# Patient Record
Sex: Male | Born: 1980 | Race: White | Hispanic: No | Marital: Married | Smoking: Former smoker
Health system: Southern US, Community
[De-identification: ages and names within clinical notes are randomized; demographics above are authoritative.]

## PROBLEM LIST (undated history)

## (undated) DIAGNOSIS — I10 Essential (primary) hypertension: Secondary | ICD-10-CM

## (undated) DIAGNOSIS — N200 Calculus of kidney: Secondary | ICD-10-CM

## (undated) DIAGNOSIS — E118 Type 2 diabetes mellitus with unspecified complications: Secondary | ICD-10-CM

## (undated) DIAGNOSIS — I255 Ischemic cardiomyopathy: Secondary | ICD-10-CM

## (undated) DIAGNOSIS — Z9119 Patient's noncompliance with other medical treatment and regimen: Secondary | ICD-10-CM

## (undated) DIAGNOSIS — E1165 Type 2 diabetes mellitus with hyperglycemia: Secondary | ICD-10-CM

## (undated) DIAGNOSIS — I25810 Atherosclerosis of coronary artery bypass graft(s) without angina pectoris: Secondary | ICD-10-CM

## (undated) DIAGNOSIS — E669 Obesity, unspecified: Secondary | ICD-10-CM

## (undated) DIAGNOSIS — Z91199 Patient's noncompliance with other medical treatment and regimen due to unspecified reason: Secondary | ICD-10-CM

## (undated) DIAGNOSIS — E785 Hyperlipidemia, unspecified: Secondary | ICD-10-CM

## (undated) DIAGNOSIS — Z8673 Personal history of transient ischemic attack (TIA), and cerebral infarction without residual deficits: Secondary | ICD-10-CM

## (undated) DIAGNOSIS — I5020 Unspecified systolic (congestive) heart failure: Secondary | ICD-10-CM

## (undated) HISTORY — DX: Type 2 diabetes mellitus with unspecified complications: E11.8

## (undated) HISTORY — DX: Obesity, unspecified: E66.9

## (undated) HISTORY — DX: Type 2 diabetes mellitus with hyperglycemia: E11.65

## (undated) HISTORY — DX: Hyperlipidemia, unspecified: E78.5

## (undated) HISTORY — DX: Patient's noncompliance with other medical treatment and regimen: Z91.19

## (undated) HISTORY — DX: Patient's noncompliance with other medical treatment and regimen due to unspecified reason: Z91.199

## (undated) HISTORY — DX: Atherosclerosis of coronary artery bypass graft(s) without angina pectoris: I25.810

## (undated) HISTORY — DX: Personal history of transient ischemic attack (TIA), and cerebral infarction without residual deficits: Z86.73

## (undated) HISTORY — DX: Essential (primary) hypertension: I10

---

## 2000-10-11 DIAGNOSIS — IMO0002 Reserved for concepts with insufficient information to code with codable children: Secondary | ICD-10-CM

## 2000-10-11 HISTORY — DX: Reserved for concepts with insufficient information to code with codable children: IMO0002

## 2004-01-07 ENCOUNTER — Other Ambulatory Visit: Payer: Self-pay

## 2004-10-25 ENCOUNTER — Emergency Department: Payer: Self-pay | Admitting: Unknown Physician Specialty

## 2005-04-23 ENCOUNTER — Emergency Department: Payer: Self-pay | Admitting: Emergency Medicine

## 2005-04-24 ENCOUNTER — Emergency Department: Payer: Self-pay | Admitting: Emergency Medicine

## 2005-04-26 ENCOUNTER — Emergency Department: Payer: Self-pay | Admitting: Emergency Medicine

## 2005-06-22 ENCOUNTER — Emergency Department: Payer: Self-pay | Admitting: Emergency Medicine

## 2005-10-11 HISTORY — PX: CORONARY ARTERY BYPASS GRAFT: SHX141

## 2005-11-22 ENCOUNTER — Other Ambulatory Visit: Payer: Self-pay

## 2005-11-22 ENCOUNTER — Emergency Department: Payer: Self-pay | Admitting: Emergency Medicine

## 2006-01-04 ENCOUNTER — Emergency Department: Payer: Self-pay | Admitting: Emergency Medicine

## 2006-02-21 ENCOUNTER — Ambulatory Visit: Payer: Self-pay | Admitting: Physician Assistant

## 2006-03-17 ENCOUNTER — Ambulatory Visit: Payer: Self-pay | Admitting: Pain Medicine

## 2006-03-31 ENCOUNTER — Ambulatory Visit: Payer: Self-pay | Admitting: Pain Medicine

## 2006-04-21 ENCOUNTER — Ambulatory Visit: Payer: Self-pay | Admitting: Pain Medicine

## 2006-05-04 ENCOUNTER — Ambulatory Visit: Payer: Self-pay | Admitting: Physician Assistant

## 2006-05-12 ENCOUNTER — Encounter: Payer: Self-pay | Admitting: Physician Assistant

## 2006-05-30 ENCOUNTER — Ambulatory Visit: Payer: Self-pay | Admitting: Physician Assistant

## 2006-09-12 ENCOUNTER — Emergency Department: Payer: Self-pay | Admitting: Emergency Medicine

## 2006-09-12 ENCOUNTER — Other Ambulatory Visit: Payer: Self-pay

## 2007-02-20 ENCOUNTER — Emergency Department: Payer: Self-pay | Admitting: Emergency Medicine

## 2007-02-21 ENCOUNTER — Inpatient Hospital Stay (HOSPITAL_COMMUNITY): Admission: AD | Admit: 2007-02-21 | Discharge: 2007-02-26 | Payer: Self-pay | Admitting: Cardiology

## 2007-02-21 ENCOUNTER — Ambulatory Visit: Payer: Self-pay | Admitting: Cardiology

## 2007-02-21 ENCOUNTER — Encounter: Payer: Self-pay | Admitting: Vascular Surgery

## 2007-02-21 ENCOUNTER — Ambulatory Visit: Payer: Self-pay | Admitting: Surgery

## 2007-03-08 ENCOUNTER — Emergency Department (HOSPITAL_COMMUNITY): Admission: EM | Admit: 2007-03-08 | Discharge: 2007-03-08 | Payer: Self-pay | Admitting: Emergency Medicine

## 2007-03-21 ENCOUNTER — Encounter: Admission: RE | Admit: 2007-03-21 | Discharge: 2007-03-21 | Payer: Self-pay | Admitting: Surgery

## 2007-03-21 ENCOUNTER — Ambulatory Visit: Payer: Self-pay | Admitting: Surgery

## 2007-05-02 ENCOUNTER — Ambulatory Visit: Payer: Self-pay | Admitting: Cardiology

## 2007-05-02 LAB — CONVERTED CEMR LAB
ALT: 43 units/L (ref 0–53)
Bilirubin, Direct: 0.1 mg/dL (ref 0.0–0.3)
CO2: 27 meq/L (ref 19–32)
Calcium: 9.5 mg/dL (ref 8.4–10.5)
Cholesterol: 175 mg/dL (ref 0–200)
Creatinine, Ser: 0.7 mg/dL (ref 0.4–1.5)
GFR calc Af Amer: 174 mL/min
Glucose, Bld: 242 mg/dL — ABNORMAL HIGH (ref 70–99)
Potassium: 3.9 meq/L (ref 3.5–5.1)
Total Bilirubin: 0.6 mg/dL (ref 0.3–1.2)
Total CHOL/HDL Ratio: 5.1
Total Protein: 7.2 g/dL (ref 6.0–8.3)
Triglycerides: 156 mg/dL — ABNORMAL HIGH (ref 0–149)

## 2007-06-13 ENCOUNTER — Inpatient Hospital Stay: Payer: Self-pay | Admitting: Cardiology

## 2007-06-13 ENCOUNTER — Ambulatory Visit: Payer: Self-pay | Admitting: Cardiology

## 2007-06-14 ENCOUNTER — Ambulatory Visit: Payer: Self-pay | Admitting: Cardiology

## 2007-06-14 ENCOUNTER — Inpatient Hospital Stay (HOSPITAL_COMMUNITY): Admission: AD | Admit: 2007-06-14 | Discharge: 2007-06-16 | Payer: Self-pay | Admitting: Cardiology

## 2007-06-19 ENCOUNTER — Ambulatory Visit: Payer: Self-pay

## 2007-06-21 ENCOUNTER — Ambulatory Visit: Payer: Self-pay | Admitting: Cardiology

## 2007-06-21 ENCOUNTER — Ambulatory Visit: Payer: Self-pay | Admitting: Cardiovascular Disease

## 2007-06-21 ENCOUNTER — Inpatient Hospital Stay (HOSPITAL_COMMUNITY): Admission: EM | Admit: 2007-06-21 | Discharge: 2007-06-23 | Payer: Self-pay | Admitting: Emergency Medicine

## 2007-07-07 ENCOUNTER — Ambulatory Visit: Payer: Self-pay | Admitting: Cardiology

## 2007-07-27 ENCOUNTER — Emergency Department: Payer: Self-pay | Admitting: Emergency Medicine

## 2007-07-27 ENCOUNTER — Other Ambulatory Visit: Payer: Self-pay

## 2007-08-09 ENCOUNTER — Ambulatory Visit: Payer: Self-pay | Admitting: Cardiology

## 2007-09-26 ENCOUNTER — Other Ambulatory Visit: Payer: Self-pay

## 2007-09-26 ENCOUNTER — Emergency Department: Payer: Self-pay | Admitting: Emergency Medicine

## 2007-09-29 ENCOUNTER — Ambulatory Visit: Payer: Self-pay | Admitting: Cardiology

## 2007-10-12 DIAGNOSIS — Z8673 Personal history of transient ischemic attack (TIA), and cerebral infarction without residual deficits: Secondary | ICD-10-CM

## 2007-10-12 HISTORY — DX: Personal history of transient ischemic attack (TIA), and cerebral infarction without residual deficits: Z86.73

## 2007-12-05 ENCOUNTER — Inpatient Hospital Stay (HOSPITAL_COMMUNITY): Admission: EM | Admit: 2007-12-05 | Discharge: 2007-12-06 | Payer: Self-pay | Admitting: Emergency Medicine

## 2007-12-05 ENCOUNTER — Ambulatory Visit: Payer: Self-pay | Admitting: *Deleted

## 2007-12-20 ENCOUNTER — Ambulatory Visit: Payer: Self-pay | Admitting: Cardiology

## 2007-12-26 ENCOUNTER — Ambulatory Visit: Payer: Self-pay | Admitting: Cardiology

## 2007-12-26 LAB — CONVERTED CEMR LAB
ALT: 30 units/L (ref 0–53)
AST: 13 units/L (ref 0–37)
Alkaline Phosphatase: 54 units/L (ref 39–117)
BUN: 15 mg/dL (ref 6–23)
Calcium: 9.2 mg/dL (ref 8.4–10.5)
Chloride: 103 meq/L (ref 96–112)
Creatinine, Ser: 0.69 mg/dL (ref 0.40–1.50)
HDL: 36 mg/dL — ABNORMAL LOW (ref >39)
LDL Cholesterol: 113 mg/dL — ABNORMAL HIGH (ref 0–99)
Total CHOL/HDL Ratio: 4.9
VLDL: 28 mg/dL (ref 0–40)

## 2008-02-21 ENCOUNTER — Ambulatory Visit: Payer: Self-pay | Admitting: Cardiology

## 2008-02-21 ENCOUNTER — Emergency Department: Payer: Self-pay | Admitting: Internal Medicine

## 2008-02-21 ENCOUNTER — Inpatient Hospital Stay (HOSPITAL_COMMUNITY): Admission: AD | Admit: 2008-02-21 | Discharge: 2008-02-23 | Payer: Self-pay | Admitting: Cardiology

## 2008-02-22 ENCOUNTER — Encounter: Payer: Self-pay | Admitting: Cardiology

## 2008-07-27 ENCOUNTER — Emergency Department: Payer: Self-pay | Admitting: Emergency Medicine

## 2008-08-15 ENCOUNTER — Ambulatory Visit: Payer: Self-pay | Admitting: Internal Medicine

## 2008-08-15 ENCOUNTER — Inpatient Hospital Stay: Payer: Self-pay | Admitting: Internal Medicine

## 2008-09-04 ENCOUNTER — Ambulatory Visit: Payer: Self-pay | Admitting: Cardiology

## 2008-11-07 ENCOUNTER — Ambulatory Visit: Payer: Self-pay | Admitting: Cardiology

## 2008-11-19 ENCOUNTER — Encounter: Payer: Self-pay | Admitting: Cardiology

## 2008-11-19 ENCOUNTER — Ambulatory Visit: Payer: Self-pay | Admitting: Cardiology

## 2008-11-19 LAB — CONVERTED CEMR LAB
ALT: 25 units/L (ref 0–53)
AST: 14 units/L (ref 0–37)
Albumin: 4.3 g/dL (ref 3.5–5.2)
Alkaline Phosphatase: 54 units/L (ref 39–117)
Glucose, Bld: 346 mg/dL — ABNORMAL HIGH (ref 70–99)
Hgb A1c MFr Bld: 12.2 % — ABNORMAL HIGH (ref 4.6–6.1)
LDL Cholesterol: 137 mg/dL — ABNORMAL HIGH (ref 0–99)
Potassium: 4.3 meq/L (ref 3.5–5.3)
Sodium: 136 meq/L (ref 135–145)
Total Bilirubin: 0.6 mg/dL (ref 0.3–1.2)
Total Protein: 7.3 g/dL (ref 6.0–8.3)
Triglycerides: 174 mg/dL — ABNORMAL HIGH (ref <150)
VLDL: 35 mg/dL (ref 0–40)

## 2009-03-03 ENCOUNTER — Encounter: Payer: Self-pay | Admitting: Cardiology

## 2009-03-03 ENCOUNTER — Telehealth (INDEPENDENT_AMBULATORY_CARE_PROVIDER_SITE_OTHER): Payer: Self-pay | Admitting: *Deleted

## 2009-03-03 ENCOUNTER — Ambulatory Visit: Payer: Self-pay | Admitting: Cardiology

## 2009-03-03 DIAGNOSIS — I10 Essential (primary) hypertension: Secondary | ICD-10-CM

## 2009-03-03 DIAGNOSIS — I25810 Atherosclerosis of coronary artery bypass graft(s) without angina pectoris: Secondary | ICD-10-CM | POA: Insufficient documentation

## 2009-03-03 DIAGNOSIS — IMO0002 Reserved for concepts with insufficient information to code with codable children: Secondary | ICD-10-CM | POA: Insufficient documentation

## 2009-03-03 DIAGNOSIS — E108 Type 1 diabetes mellitus with unspecified complications: Secondary | ICD-10-CM

## 2009-03-03 DIAGNOSIS — E785 Hyperlipidemia, unspecified: Secondary | ICD-10-CM | POA: Insufficient documentation

## 2009-03-03 DIAGNOSIS — E1065 Type 1 diabetes mellitus with hyperglycemia: Secondary | ICD-10-CM

## 2009-03-04 ENCOUNTER — Telehealth (INDEPENDENT_AMBULATORY_CARE_PROVIDER_SITE_OTHER): Payer: Self-pay | Admitting: *Deleted

## 2009-03-05 ENCOUNTER — Ambulatory Visit: Payer: Self-pay | Admitting: Cardiology

## 2009-03-05 ENCOUNTER — Ambulatory Visit: Payer: Self-pay

## 2009-03-05 ENCOUNTER — Encounter (INDEPENDENT_AMBULATORY_CARE_PROVIDER_SITE_OTHER): Payer: Self-pay | Admitting: *Deleted

## 2009-03-05 DIAGNOSIS — R0789 Other chest pain: Secondary | ICD-10-CM

## 2009-03-17 LAB — CONVERTED CEMR LAB
ALT: 33 units/L (ref 0–53)
AST: 20 units/L (ref 0–37)
Cholesterol: 288 mg/dL — ABNORMAL HIGH (ref 0–200)
Direct LDL: 210 mg/dL
HDL: 35.4 mg/dL — ABNORMAL LOW (ref >39.00)
Hgb A1c MFr Bld: 12 % — ABNORMAL HIGH (ref 4.6–6.5)
Total Bilirubin: 1.2 mg/dL (ref 0.3–1.2)
Total Protein: 7 g/dL (ref 6.0–8.3)
Triglycerides: 233 mg/dL — ABNORMAL HIGH (ref 0.0–149.0)

## 2009-03-24 ENCOUNTER — Encounter: Payer: Self-pay | Admitting: Cardiovascular Disease

## 2009-03-24 ENCOUNTER — Inpatient Hospital Stay: Payer: Commercial Managed Care - HMO | Admitting: Internal Medicine

## 2009-03-24 ENCOUNTER — Ambulatory Visit: Payer: Self-pay | Admitting: Cardiovascular Disease

## 2009-03-24 ENCOUNTER — Telehealth: Payer: Self-pay | Admitting: Cardiology

## 2009-04-01 ENCOUNTER — Telehealth: Payer: Self-pay | Admitting: Cardiology

## 2009-04-01 ENCOUNTER — Ambulatory Visit: Payer: Self-pay | Admitting: Cardiology

## 2009-04-01 ENCOUNTER — Inpatient Hospital Stay (HOSPITAL_BASED_OUTPATIENT_CLINIC_OR_DEPARTMENT_OTHER): Admission: RE | Admit: 2009-04-01 | Discharge: 2009-04-01 | Payer: Self-pay | Admitting: Cardiology

## 2009-04-24 ENCOUNTER — Encounter: Payer: Self-pay | Admitting: Cardiology

## 2009-04-24 ENCOUNTER — Ambulatory Visit: Payer: Self-pay | Admitting: Cardiology

## 2009-05-14 ENCOUNTER — Telehealth: Payer: Self-pay | Admitting: Cardiology

## 2009-06-03 ENCOUNTER — Encounter: Payer: Self-pay | Admitting: Cardiology

## 2009-06-03 ENCOUNTER — Ambulatory Visit: Payer: Self-pay | Admitting: Cardiology

## 2009-06-09 LAB — CONVERTED CEMR LAB
Albumin: 4.3 g/dL (ref 3.5–5.2)
Alkaline Phosphatase: 48 units/L (ref 39–117)
Bilirubin, Direct: 0.1 mg/dL (ref 0.0–0.3)
HDL: 34 mg/dL — ABNORMAL LOW (ref >39)
LDL Cholesterol: 81 mg/dL (ref 0–99)
Total Bilirubin: 0.5 mg/dL (ref 0.3–1.2)
Total CHOL/HDL Ratio: 4.1

## 2009-06-24 ENCOUNTER — Telehealth: Payer: Self-pay | Admitting: Cardiology

## 2009-08-12 ENCOUNTER — Ambulatory Visit: Payer: Self-pay | Admitting: Cardiology

## 2009-09-15 ENCOUNTER — Ambulatory Visit: Payer: Self-pay | Admitting: Internal Medicine

## 2009-09-15 ENCOUNTER — Encounter: Payer: Self-pay | Admitting: Cardiology

## 2009-09-25 ENCOUNTER — Ambulatory Visit: Payer: Self-pay | Admitting: Internal Medicine

## 2009-09-25 ENCOUNTER — Inpatient Hospital Stay: Payer: Commercial Managed Care - HMO | Admitting: Internal Medicine

## 2009-10-01 ENCOUNTER — Telehealth: Payer: Self-pay | Admitting: Cardiology

## 2009-10-01 LAB — CONVERTED CEMR LAB
ALT: 36 units/L (ref 0–53)
CO2: 20 meq/L (ref 19–32)
Calcium: 10 mg/dL (ref 8.4–10.5)
Chloride: 101 meq/L (ref 96–112)
Cholesterol: 159 mg/dL (ref 0–200)
Glucose, Bld: 340 mg/dL — ABNORMAL HIGH (ref 70–99)
Sodium: 138 meq/L (ref 135–145)
Total Protein: 7.2 g/dL (ref 6.0–8.3)
Triglycerides: 186 mg/dL — ABNORMAL HIGH (ref <150)

## 2009-10-30 ENCOUNTER — Encounter: Payer: Self-pay | Admitting: Cardiology

## 2009-11-20 ENCOUNTER — Telehealth: Payer: Self-pay | Admitting: Cardiology

## 2009-11-21 ENCOUNTER — Emergency Department: Payer: Commercial Managed Care - HMO | Admitting: Emergency Medicine

## 2009-12-13 ENCOUNTER — Encounter: Payer: Self-pay | Admitting: Cardiology

## 2009-12-13 ENCOUNTER — Observation Stay: Payer: Commercial Managed Care - HMO | Admitting: Specialist

## 2009-12-13 ENCOUNTER — Ambulatory Visit: Payer: Self-pay | Admitting: Cardiology

## 2010-07-26 ENCOUNTER — Encounter: Payer: Self-pay | Admitting: Cardiovascular Disease

## 2010-07-26 ENCOUNTER — Inpatient Hospital Stay: Payer: Commercial Managed Care - HMO | Admitting: Specialist

## 2010-07-27 ENCOUNTER — Ambulatory Visit: Payer: Self-pay | Admitting: Cardiology

## 2010-07-27 ENCOUNTER — Encounter: Payer: Self-pay | Admitting: Cardiovascular Disease

## 2010-08-26 ENCOUNTER — Ambulatory Visit: Payer: Self-pay | Admitting: Cardiovascular Disease

## 2010-09-24 ENCOUNTER — Telehealth (INDEPENDENT_AMBULATORY_CARE_PROVIDER_SITE_OTHER): Payer: Self-pay | Admitting: *Deleted

## 2010-11-11 NOTE — Progress Notes (Signed)
Summary: Pain   Phone Note Call from Patient Call back at [phone]   Caller: Patient Call For: RN Summary of Call: Patient called and said for the last week he has had a really bad cough.  He is now experiencing pain in his chest.  Not sure if he needs to be seen or if this is normal has not experienced this pain before with a cough. Initial call taken by: [place],  November 20, 2009 9:43 AM  Follow-up for Phone Call        left message on wifes cell phone that hard coughing can result in muscle/chest pain.  since pt has been coughing for a week instructed pt to see PCP for management of issues.  Follow-up by: Charlena Cross, RN, BSN,  November 20, 2009 10:07 AM

## 2010-11-11 NOTE — Miscellaneous (Signed)
Summary: meds updated  Clinical Lists Changes  Medications: Added new medication of ZETIA 10 MG TABS (EZETIMIBE) Take one tablet by mouth daily.

## 2010-11-11 NOTE — Consult Note (Signed)
[facility]   Imported By: Harlon Flor 12/15/2009 16:03:46  _____________________________________________________________________  External Attachment:    Type:   Image     Comment:   External Document  Appended Document: [facility]  need to make sure he has followup  Appended Document: [facility]  has appt with Dr. Mariah Milling 10/31

## 2010-11-11 NOTE — Assessment & Plan Note (Signed)
Summary: EC6/AMD   Visit Type:  Initial Consult Primary Wilmot Quevedo:  Dr Sheppard Penton  CC:  Has not felt very well for the past couple of days; has sinus drainage and muscle aches.  He does experience chest tightness at times..  History of Present Illness: 30 yo with h/o premature CAD s/p CABG and PCI, long history of smoking though he states that he has stopped recently, medication noncompliance due to financial issues presents for followup.   Mr. Loughney reports that he has been out of his medications for some time. He is unable to afford Crestor or Plavix. He's also been out of his ACE inhibitor and beta blocker. He has occasional chest pain and was recently in the hospital October 17 for an episode of chest pain during which he ruled out for MI and had a Myoview that showed no ischemia, scar in the basal to mid inferolateral wall, small fixed midanterior septal perfusion defect.  he reports that he has not been buying his insulin as this is extensive. His sugars have been climbing that he has been trying to watch what he eats.  He does report that occasionally he has symptoms like something is trying to burst out of his chest on the left. He takes a nitroglycerin and it goes away quickly. He did have some shortness of breath while hunting he was walking up and down hills carrying lots of items.  ECG: NSR, rate of 85 beats per minute, ST abnormality in the inferior leads consistent with repolarization abnormality  Labs (8/10): LDL 81, HDL 34   Preventive Screening-Counseling & Management  Alcohol-Tobacco     Smoking Status: quit  Caffeine-Diet-Exercise     Does Patient Exercise: yes  Current Medications (verified): 1)  Aspir-Low 81 Mg Tbec (Aspirin) .... One Tablet Once Daily 2)  Plavix 75 Mg Tabs (Clopidogrel Bisulfate) .... Take One Tablet By Mouth Daily 3)  Relion N 100 Unit/ml Susp (Insulin Isophane Human) .... 40 Units Two Times A Day 4)  Lisinopril 5 Mg Tabs (Lisinopril) .... Take One  Tablet By Mouth Daily 5)  Metoprolol Tartrate 25 Mg Tabs (Metoprolol Tartrate) .... Take One Tablet By Mouth Twice A Day 6)  Cvs Ibuprofen Ib 200 Mg Tabs (Ibuprofen) .Marland Kitchen.. 1 By Mouth As Needed 7)  Crestor 40 Mg Tabs (Rosuvastatin Calcium) .... Take One Tablet By Mouth Daily.  Allergies (verified): No Known Drug Allergies  Past History:  Past Medical History: Last updated: 08/12/2009 1. Coronary artery disease status post coronary artery bypass grafting in May 2008.  There is an SVG to the PDA, SVG to the diagonal, LIMA to the LAD, and a free RIMA to an obtuse marginal.  The patient had PCI to the mid circumflex with a Cypher stent in August 2008.  The  patient's most recent heart cath was in 6/10 in the setting of multiple episodes of atypical chest pain and an equivocal myoview.  This showed LVEDP 14 mmHg, 90% RCA stenosis with patent SVG-PDA, patent proximal CFX stent, 50% distal CFX stenosis, 40% ostial OM1 stenosis, patent free RIMA to OM1 graft, 90% mid LAD stenosis, totally occluded diagonal, patent LIMA-LAD and patent SVG-diagonal.  EF 55-60% on LV-gram.  No lesion that could cause ischemic chest pain was noted.  The patient also had an echo done in November 2009, which showed an EF greater than 55%.  RV was normal in size and function.  There was mild diastolic dysfunction. 2. Hypertension. 3. Type 2 diabetes, which has been poorly controlled.  4. Hypercholesterolemia, which has been poorly controlled. 5. Poor medical compliance,  mainly due to economic issues. 6. Possible TIA with presentation to [facility] in November 2009.  The patient did have a normal MRI of the brain and MRA of the head and neck.  7. Medication noncompliance, mainly due to cost.   Family History: Last updated: 03/03/2009 Family history of premature CAD.   Social History: Last updated: 08/26/2010 Unemployed, married with one son.  Lives in [place].  On disability and has Medicaid.    Tobacco Use - No.  Tobacco Use - Former. Smoked 1 1/2 PPD. Quit 2007. Alcohol Use - no Regular Exercise - yes  Risk Factors: Exercise: yes (08/26/2010)  Risk Factors: Smoking Status: quit (08/26/2010)  Past Surgical History: CABG x 4 at [facility] 2007  Social History: Unemployed, married with one son.  Lives in [place].  On disability and has Medicaid.  Tobacco Use - No.  Tobacco Use - Former. Smoked 1 1/2 PPD. Quit 2007. Alcohol Use - no Regular Exercise - yes Smoking Status:  quit Does Patient Exercise:  yes  Review of Systems       The patient complains of chest pain.  The patient denies fever, weight loss, weight gain, vision loss, decreased hearing, hoarseness, syncope, dyspnea on exertion, peripheral edema, prolonged cough, abdominal pain, incontinence, muscle weakness, depression, and enlarged lymph nodes.    Vital Signs:  Patient profile:   30 year old male Height:      73 inches Weight:      249 pounds BMI:     32.97 Pulse rate:   85 / minute BP sitting:   118 / 80  (left arm) Cuff size:   large  Vitals Entered By: Bishop Dublin, CMA (August 26, 2010 3:30 PM)  Physical Exam  General:  Well developed, well nourished, in no acute distress. Head:  normocephalic and atraumatic Neck:  Neck supple, no JVD. No masses, thyromegaly or abnormal cervical nodes. Lungs:  Clear bilaterally to auscultation and percussion. Heart:  Non-displaced PMI, chest non-tender; regular rate and rhythm, S1, S2 without murmurs, rubs or gallops. Carotid upstroke normal, no bruit.  Pedals normal pulses. No edema, no varicosities. Abdomen:  Bowel sounds positive; abdomen soft and non-tender without masses Msk:  Back normal, normal gait. Muscle strength and tone normal. Pulses:  pulses normal in all 4 extremities Extremities:  No clubbing or cyanosis. Neurologic:  Alert and oriented x 3. Skin:  Intact without lesions or rashes. Psych:  Normal affect.   Impression &  Recommendations:  Problem # 1:  CHEST PAIN UNSPECIFIED (ICD-786.50) he does have episodes of chest pain consistent with angina. These have been rare, and relieved with occasional nitroglycerin. He had a recent stress test showing no ischemia. I suspect he does have underlying small vessel disease given his poorly controlled diabetes and long history of smoking.  He is not interested in starting long-acting nitroglycerin at this time. He will take his metoprolol and lisinopril. He does not want to fill his Crestor and Plavix as they are too expensive.  His updated medication list for this problem includes:    Aspir-low 81 Mg Tbec (Aspirin) ..... One tablet once daily    Plavix 75 Mg Tabs (Clopidogrel bisulfate) .Marland Kitchen... Take one tablet by mouth daily    Lisinopril 5 Mg Tabs (Lisinopril) .Marland Kitchen... Take one tablet by mouth daily    Metoprolol Tartrate 25 Mg Tabs (Metoprolol tartrate) .Marland Kitchen... Take one tablet by mouth twice a  day  Problem # 2:  HYPERLIPIDEMIA-MIXED (ICD-272.4) As he's not feeling his Crestor, we have given him a prescription for Lipitor 80 mg daily which he can feel when he goes generic next month.  The following medications were removed from the medication list:    Zetia 10 Mg Tabs (Ezetimibe) .Marland Kitchen... Take one tablet by mouth daily. His updated medication list for this problem includes:    Lipitor 80 Mg Tabs (Atorvastatin calcium) .Marland Kitchen... Take one tablet by mouth daily.  Problem # 3:  CAD, ARTERY BYPASS GRAFT (ICD-414.04) As detailed from a recent stress test which showed no ischemia. Will put him back on his aspirin 81 mg x2 with lisinopril and metoprolol.  He cannot afford Plavix at this time.  His updated medication list for this problem includes:    Aspir-low 81 Mg Tbec (Aspirin) ..... One tablet once daily    Plavix 75 Mg Tabs (Clopidogrel bisulfate) .Marland Kitchen... Take one tablet by mouth daily    Lisinopril 5 Mg Tabs (Lisinopril) .Marland Kitchen... Take one tablet by mouth daily    Metoprolol Tartrate  25 Mg Tabs (Metoprolol tartrate) .Marland Kitchen... Take one tablet by mouth twice a day  Orders: EKG w/ Interpretation (93000)  Problem # 4:  HYPERTENSION, UNSPECIFIED (ICD-401.9) We'll start him back on his ACE inhibitor and beta blocker.  His updated medication list for this problem includes:    Aspir-low 81 Mg Tbec (Aspirin) ..... One tablet once daily    Lisinopril 5 Mg Tabs (Lisinopril) .Marland Kitchen... Take one tablet by mouth daily    Metoprolol Tartrate 25 Mg Tabs (Metoprolol tartrate) .Marland Kitchen... Take one tablet by mouth twice a day  Patient Instructions: 1)  Your physician recommends that you schedule a follow-up appointment in: 6 months 2)  Your physician has recommended you make the following change in your medication: Start Lipitor 80mg  once daily once goes generic in December.  Prescriptions: LISINOPRIL 5 MG TABS (LISINOPRIL) Take one tablet by mouth daily  #30 x 6   Entered by:   Cloyde Reams RN   Authorized by:   Dossie Arbour MD   Signed by:   Cloyde Reams RN on 08/26/2010   Method used:   Electronically to        [facility] [address] (retail)       [address], [address]       [place], [place]  [address]       Ph: [phone]       Fax: [phone]   RxID:   [id number] METOPROLOL TARTRATE 25 MG TABS (METOPROLOL TARTRATE) Take one tablet by mouth twice a day  #60 x 6   Entered by:   Cloyde Reams RN   Authorized by:   Dossie Arbour MD   Signed by:   Cloyde Reams RN on 08/26/2010   Method used:   Electronically to        [facility] [address] (retail)       [address], [address]       [place], [place]  [address]       Ph: [phone]       Fax: [phone]   RxID:   [id number] LIPITOR 80 MG TABS (ATORVASTATIN CALCIUM) Take one tablet by mouth daily.  #30 x 6   Entered by:   Cloyde Reams RN   Authorized by:   Dossie Arbour MD   Signed by:  Cloyde Reams RN on 08/26/2010   Method used:   Electronically to         [facility] [address] (retail)       [address], [address]       [place], [place]  [address]       Ph: [phone]       Fax: [phone]   RxID:   [id number] CRESTOR 20 MG TABS (ROSUVASTATIN CALCIUM) Take one tablet by mouth daily.  #30 x 0   Entered by:   Cloyde Reams RN   Authorized by:   Dossie Arbour MD   Signed by:   Cloyde Reams RN on 08/26/2010   Method used:   Print then Give to Patient   RxID:   [id number]

## 2010-11-11 NOTE — Consult Note (Signed)
[facility]   [facility]   Imported By: Roderic Ovens 08/03/2010 15:41:45  _____________________________________________________________________  External Attachment:    Type:   Image     Comment:   External Document

## 2010-11-12 NOTE — Progress Notes (Signed)
  REquest received from [employer] sent to [employer]  September 24, 2010 2:13 PM

## 2010-12-17 ENCOUNTER — Institutional Professional Consult (permissible substitution): Payer: Self-pay | Admitting: Cardiology

## 2010-12-18 ENCOUNTER — Encounter: Payer: Self-pay | Admitting: Cardiology

## 2010-12-18 ENCOUNTER — Institutional Professional Consult (permissible substitution) (INDEPENDENT_AMBULATORY_CARE_PROVIDER_SITE_OTHER): Payer: Medicare Other | Admitting: Cardiology

## 2010-12-18 DIAGNOSIS — I2510 Atherosclerotic heart disease of native coronary artery without angina pectoris: Secondary | ICD-10-CM

## 2010-12-18 DIAGNOSIS — E119 Type 2 diabetes mellitus without complications: Secondary | ICD-10-CM

## 2010-12-21 ENCOUNTER — Encounter: Payer: Self-pay | Admitting: Cardiology

## 2010-12-22 ENCOUNTER — Encounter: Payer: Self-pay | Admitting: Family Medicine

## 2010-12-22 ENCOUNTER — Encounter (INDEPENDENT_AMBULATORY_CARE_PROVIDER_SITE_OTHER): Payer: Medicare Other | Admitting: Family Medicine

## 2010-12-22 DIAGNOSIS — I10 Essential (primary) hypertension: Secondary | ICD-10-CM

## 2010-12-22 DIAGNOSIS — S139XXA Sprain of joints and ligaments of unspecified parts of neck, initial encounter: Secondary | ICD-10-CM

## 2010-12-22 DIAGNOSIS — I25810 Atherosclerosis of coronary artery bypass graft(s) without angina pectoris: Secondary | ICD-10-CM

## 2010-12-22 DIAGNOSIS — E119 Type 2 diabetes mellitus without complications: Secondary | ICD-10-CM

## 2010-12-22 DIAGNOSIS — E669 Obesity, unspecified: Secondary | ICD-10-CM | POA: Insufficient documentation

## 2010-12-22 DIAGNOSIS — E785 Hyperlipidemia, unspecified: Secondary | ICD-10-CM

## 2010-12-22 LAB — HM DIABETES FOOT EXAM

## 2010-12-22 NOTE — Assessment & Plan Note (Signed)
Summary: ROV chest pain/sgc,cma   Visit Type:  Initial Consult Primary Provider:  None. Looking for a PCP  CC:  c/o chest pain and pain near collar bone. denies SOB.[name]  History of Present Illness: 30 yo with h/o premature CAD s/p CABG and PCI presents for followup.  He had some chest pain in 10/11 and went to [facility] where he had a stress test showing basal to mid inferolateral scar and a small area of scar in the mid anteroseptum.  There was no ischemia.  He saw Dr. Mariah Milling in 11/11, at which time he was out of his medications and was restarted on them.  Lately he has been doing fairly well.  He has had no chest pain recently.  He has been getting some soreness in his left upper back that is not exertional.  He has some shortness of breath walking up hills (this has been chronic).  He can climb a flight of steps without trouble.  He is still having trouble getting his medications and still has not followed up with a PCP.  He is taking insulin 70/30 50 units two times a day and says that his blood glucose is running in the 200s.  He initially said he was taking all his meds, then he said he was "stretching out what he had left."  I am unsure about his compliance.  He is definitely out of Plavix.   ECG: NSR, normal  Current Medications (verified): 1)  Aspir-Low 81 Mg Tbec (Aspirin) .... 2 Tablest Once Daily 2)  Plavix 75 Mg Tabs (Clopidogrel Bisulfate) .... Take One Tablet By Mouth Daily 3)  Relion N 100 Unit/ml Susp (Insulin Isophane Human) .... 40 Units Two Times A Day 4)  Lisinopril 5 Mg Tabs (Lisinopril) .... Take One Tablet By Mouth Daily 5)  Metoprolol Tartrate 25 Mg Tabs (Metoprolol Tartrate) .... Take One Tablet By Mouth Twice A Day 6)  Cvs Ibuprofen Ib 200 Mg Tabs (Ibuprofen) .[name].. 1 By Mouth As Needed 7)  Lipitor 80 Mg Tabs (Atorvastatin Calcium) .... Take One Tablet By Mouth Daily.  Allergies (verified): No Known Drug Allergies  Past History:  Past Surgical History: Last updated:  08/26/2010 CABG x 4 at [facility] 2007  Family History: Last updated: 03/03/2009 Family history of premature CAD.   Social History: Last updated: 12/18/2010 Unemployed, married with one son.  Lives in [place].  On disability.  Tobacco Use - Former. Smoked 1 1/2 PPD. Quit 2007. Alcohol Use - no Regular Exercise - yes  Risk Factors: Exercise: yes (08/26/2010)  Risk Factors: Smoking Status: quit (08/26/2010)  Past Medical History: 1. Coronary artery disease status post coronary artery bypass grafting in May 2008.  There is an SVG to the PDA, SVG to the diagonal, LIMA to the LAD, and a free RIMA to an obtuse marginal.  The patient had PCI to the mid circumflex with a Cypher stent in August 2008.  The  patient's most recent heart cath was in 6/10 in the setting of multiple episodes of atypical chest pain and an equivocal myoview.  This showed LVEDP 14 mmHg, 90% RCA stenosis with patent SVG-PDA, patent proximal CFX stent, 50% distal CFX stenosis, 40% ostial OM1 stenosis, patent free RIMA to OM1 graft, 90% mid LAD stenosis, totally occluded diagonal, patent LIMA-LAD and patent SVG-diagonal.  EF 55-60% on LV-gram.  No lesion that could cause ischemic chest pain was noted.  Last echo 11/09 showed EF > 55%, normal RV, mild diastolic dysfunction.  ETT-myoview (10/11)  at [facility]: basal to mid inferolateral scar, mild mid anteroseptal scar, no ischemia.  2. Hypertension. 3. Type 2 diabetes, which has been poorly controlled. 4. Hypercholesterolemia, which has been poorly controlled. 5. Poor medical compliance,  mainly due to economic issues. 6. Possible TIA with presentation to [facility] in November 2009.  The patient did have a normal MRI of the brain and MRA of the head and neck.   Family History: Reviewed history from 03/03/2009 and no changes required. Family history of premature CAD.   Social History: Unemployed, married with one son.  Lives in [place].  On  disability.  Tobacco Use - Former. Smoked 1 1/2 PPD. Quit 2007. Alcohol Use - no Regular Exercise - yes  Review of Systems       All systems reviewed and negative except as per HPI.   Vital Signs:  Patient profile:   30 year old male Height:      73 inches Weight:      248.25 pounds BMI:     32.87 Pulse rate:   91 / minute BP sitting:   132 / 87  (left arm) Cuff size:   large  Vitals Entered By: Lysbeth Galas CMA (December 18, 2010 9:53 AM)  Physical Exam  General:  Well developed, well nourished, in no acute distress. Neck:  Neck supple, no JVD. No masses, thyromegaly or abnormal cervical nodes. Lungs:  Clear bilaterally to auscultation and percussion. Heart:  Non-displaced PMI, chest non-tender; regular rate and rhythm, S1, S2 without murmurs, rubs or gallops. Carotid upstroke normal, no bruit.  Pedals normal pulses. No edema, no varicosities. Abdomen:  Bowel sounds positive; abdomen soft and non-tender without masses, organomegaly, or hernias noted. No hepatosplenomegaly. Msk:  Tenderness to palpation over the trapezius ridge on the left.  Extremities:  No clubbing or cyanosis. Neurologic:  Alert and oriented x 3. Psych:  Normal affect.   Impression & Recommendations:  Problem # 1:  CAD, ARTERY BYPASS GRAFT (ICD-414.04) Stable with no ischemic symptoms.  I think that the back pain is musculoskeletal (tender over trapezius ridge).  He needs to continue ASA, Plavix, lisinopril, and metoprolol.  He seems to be taking all of these except Plavix.  We will give him Plavix samples.  We will give him the paperwork for the patient assistance programs run by the makers of Plavix and Lipitor.  He should be able to get both free as he has no prescription coverage.   Problem # 2:  DIABETES MELLITUS, TYPE II (ICD-250.00) Control is most likely poor.  Will get HgbA1c.  He needs a PCP.  I am going to refer him to [facility].   Problem # 3:  HYPERLIPIDEMIA-MIXED (ICD-272.4) Goal LDL < 70.   Check lipids/LFTs.   Other Orders: EKG w/ Interpretation (93000)  Patient Instructions: 1)  Your physician recommends that you schedule a follow-up appointment in: 4 months 2)  Your physician recommends that you return for a FASTING lipid profile: (BMP, Lipid, Lft, Hgb A1c) Next week 3)  You have been referred to Primary Care:

## 2010-12-23 ENCOUNTER — Other Ambulatory Visit: Payer: Medicare Other

## 2010-12-25 ENCOUNTER — Other Ambulatory Visit: Payer: Medicare Other

## 2010-12-28 ENCOUNTER — Encounter: Payer: Self-pay | Admitting: Cardiology

## 2010-12-28 ENCOUNTER — Emergency Department: Payer: Commercial Managed Care - HMO | Admitting: Emergency Medicine

## 2010-12-28 ENCOUNTER — Other Ambulatory Visit (INDEPENDENT_AMBULATORY_CARE_PROVIDER_SITE_OTHER): Payer: Medicare Other

## 2010-12-28 DIAGNOSIS — Z79899 Other long term (current) drug therapy: Secondary | ICD-10-CM

## 2010-12-28 DIAGNOSIS — E785 Hyperlipidemia, unspecified: Secondary | ICD-10-CM

## 2010-12-28 DIAGNOSIS — E119 Type 2 diabetes mellitus without complications: Secondary | ICD-10-CM

## 2010-12-29 NOTE — Assessment & Plan Note (Signed)
Summary: NEW [facility]ARE-REF BY DR, MCLEAN-CARDIOLOGIST- TO EST W/ PCP   Vital Signs:  Patient profile:   30 year old male Height:      73 inches Weight:      251 pounds Temp:     97.8 degrees F oral Pulse rate:   88 / minute Pulse rhythm:   regular BP sitting:   116 / 84  (left arm) Cuff size:   large  Vitals Entered By: Selena Batten Dance CMA Duncan Dull) (December 22, 2010 10:33 AM) CC: New patient to establish care   History of Present Illness: CC: new patient, establish  previously saw Dr. Sheppard Penton in [place].  not taking meds as should be, some because of finance, some because of self proclaimed stubbornness.  neck pain/shoulder pain - going on for 2 wks, feels like has weights on shoulders.  pain collarbone and neck area.  has tried neck rubs, tylenol, advil, alleve, heating pad.  Nothing really helps.  Pain starts upper chest/shoulders, radiates to back of neck.  no weakness or shooting pain down arms.  no current numbness, has had R arm numb in past.  DM - out of control.  to get A1c checked Friday by cards.  A1c last checked last year, high.  On insulin shots (70/30, 50 u two times a day).  would bring blood sugar down.  Ranged 250-325.  DM dx 30yo.  unsure if T1 or T2DM.  drank alot of mountain dew.  Last vision screen middle of last year, last foot exam unsure.  biggest issue is eating large portions and carbs.  usually would check three times a day now.    HLD - on crestor samples, then planning on getting back to lipitor.  Weight 305 lbs at DM dx 2001, has lost  ~50lbs in last 10 years.    financial concerns.  Current Medications (verified): 1)  Aspir-Low 81 Mg Tbec (Aspirin) .Marland Kitchen.. 1 Tablet Once Daily 2)  Plavix 75 Mg Tabs (Clopidogrel Bisulfate) .... Take One Tablet By Mouth Daily 3)  Novolog Mix 70/30 70-30 % Susp (Insulin Aspart Prot & Aspart) .... 20 Units Two Times A Day, Qs 1 Mo 4)  Lisinopril 5 Mg Tabs (Lisinopril) .... Take One Tablet By Mouth Daily 5)  Metoprolol Tartrate 25 Mg Tabs  (Metoprolol Tartrate) .... Take One Tablet By Mouth Twice A Day 6)  Cvs Ibuprofen Ib 200 Mg Tabs (Ibuprofen) .Marland Kitchen.. 1 By Mouth As Needed 7)  Lipitor 80 Mg Tabs (Atorvastatin Calcium) .... Take One Tablet By Mouth Daily. 8)  Crestor 10 Mg Tabs (Rosuvastatin Calcium) .... 2 By Mouth At Bedtime  Allergies (verified): No Known Drug Allergies  Past History:  Past medical, surgical, family and social histories (including risk factors) reviewed, and no changes noted (except as noted below).  Past Medical History: 1. Coronary artery disease status post coronary artery bypass grafting in May 2008.  There is an SVG to the PDA, SVG to the diagonal, LIMA to the LAD, and a free RIMA to an obtuse marginal.  The patient had PCI to the mid circumflex with a Cypher stent in August 2008.  The  patient's most recent heart cath was in 6/10 in the setting of multiple episodes of atypical chest pain and an equivocal myoview.  This showed LVEDP 14 mmHg, 90% RCA stenosis with patent SVG-PDA, patent proximal CFX stent, 50% distal CFX stenosis, 40% ostial OM1 stenosis, patent free RIMA to OM1 graft, 90% mid LAD stenosis, totally occluded diagonal, patent LIMA-LAD and patent  SVG-diagonal.  EF 55-60% on LV-gram.  No lesion that could cause ischemic chest pain was noted.  Last echo 11/09 showed EF > 55%, normal RV, mild diastolic dysfunction.  ETT-myoview (10/11) at [facility]: basal to mid inferolateral scar, mild mid anteroseptal scar, no ischemia.  2. Hypertension. 3. Type 2 diabetes, which has been poorly controlled (dx 2002). 4. Hypercholesterolemia, which has been poorly controlled. 5. Poor medical compliance,  mainly due to economic issues. 6. Possible TIA with presentation to [facility] in November 2009.  The patient did have a normal MRI of the brain and MRA of the head and neck.   Past Surgical History: CABG x 4 at [facility] 2007 (age 26yo)  Family History: Reviewed history from 03/03/2009 and no  changes required. Father: smoker, emphysema Mother: healthy MGF: CAD/MI 27s, DM, HTN, HLD Pucles/aunts: lung CA (all smokers) Family history of premature CAD.  No CA.  Social History: Reviewed history from 12/18/2010 and no changes required. quit smoking 2006 (1 1/2 ppd prior), no EtOH, no rec drugs caffeine: 1 cup coffee, 3/4 diet soda 2L per day  occupation: disability for heart condition Lives with wife and son (8yo).  Lives in [place].  On disability.  Regular Exercise - yes, coaches baseball, gets outside and walks daily, hunting.  Review of Systems       The patient complains of chest pain.  The patient denies anorexia, fever, weight loss, weight gain, vision loss, decreased hearing, hoarseness, syncope, dyspnea on exertion, peripheral edema, prolonged cough, headaches, hemoptysis, abdominal pain, melena, hematochezia, severe indigestion/heartburn, hematuria, depression, and testicular masses.         no n/v/d all other systems reviewed and negative  Physical Exam  General:  Well developed, well nourished, in no acute distress.  VSS, reviewed. Head:  normocephalic and atraumatic Eyes:  No corneal or conjunctival inflammation noted. EOMI. Perrla.  Ears:  TMs clear bilaterally Nose:  nares clear bilaterally Mouth:  MMM, no pharygeal erythema/exudates Neck:  Neck supple, no JVD. No masses, thyromegaly or abnormal cervical nodes. Chest Wall:  tender to palpation R 2nd costochondral juncture, mild swelling. Lungs:  Normal respiratory effort, chest expands symmetrically. Lungs are clear to auscultation, no crackles or wheezes. Heart:  Normal rate and regular rhythm. S1 and S2 normal without gallop, murmur, click, rub or other extra sounds. Abdomen:  Bowel sounds positive; abdomen soft and non-tender without masses, organomegaly, or hernias noted. No hepatosplenomegaly. Msk:  FROM at neck, neg spurling tight trapezius throughout no midline spinal tenderness, no paraspinous mm  tenderness Pulses:  2+ rad pulses  2+ DP/PT bilat Extremities:  mild edema Neurologic:  CN grossly intact, station and gait intact. strength (including grip strength) and sensation intact BUE Skin:  Intact without lesions or rashes. Psych:  full affect, pleasant and cooperative with exam  Diabetes Management Exam:    Foot Exam (with socks and/or shoes not present):       Sensory-Pinprick/Light touch:          Left medial foot (L-4): normal          Left dorsal foot (L-5): normal          Left lateral foot (S-1): normal          Right medial foot (L-4): normal          Right dorsal foot (L-5): normal          Right lateral foot (S-1): normal       Sensory-Monofilament:  Left foot: normal          Right foot: diminished       Inspection:          Left foot: normal          Right foot: normal       Nails:          Left foot: normal          Right foot: normal   Impression & Recommendations:  Problem # 1:  DIABETES MELLITUS, TYPE II (ICD-250.00) A1c out of control.  currently not on meds, ran out of insulin was too expensive.  restart novolong 70/30.  rec start at 20u two times a day (previously on 50u two times a day and not controlled).  also will start metformin again.  Cr would tolerate.  pt states has taken well in past.  discussed side effect to watch out for - n/gi upset.  discussed slow titration up on 70/30 ( 1-2 units two times a day every 3 days if avg fasting sugar >200.)  to call me in 2 wks with update on sugars.  to return in 1 mo.  states has lab appt scheduled for friday. discussed reasons to treat DM including nephropathy, retinopathy amputations, infection and other end organ damage (CAD/CVA, etc) foot exam today.  per patient UTD vision screen (mid last year) keep log and bring next visit.  His updated medication list for this problem includes:    Aspir-low 81 Mg Tbec (Aspirin) .Marland Kitchen... 1 tablet once daily    Novolog Mix 70/30 70-30 % Susp (Insulin aspart  prot & aspart) .Marland Kitchen... 20 units two times a day, qs 1 mo    Metformin Hcl 500 Mg Tabs (Metformin hcl) .Marland Kitchen... Take one nightly, may increase to one twice daily after 1 week    Lisinopril 5 Mg Tabs (Lisinopril) .Marland Kitchen... Take one tablet by mouth daily  Labs Reviewed: Creat: 0.76 (09/15/2009)    Reviewed HgBA1c results: 12.0 (03/05/2009)  12.2 (11/19/2008)  Problem # 2:  HYPERTENSION, UNSPECIFIED (ICD-401.9) not hypertensive, off meds.  recommended restart meds, refilled.  would likely just start with ACEI initially, then add on B blocker if tolerated.  states has lab appt scheduled for friday.  His updated medication list for this problem includes:    Lisinopril 5 Mg Tabs (Lisinopril) .Marland Kitchen... Take one tablet by mouth daily    Metoprolol Tartrate 25 Mg Tabs (Metoprolol tartrate) .Marland Kitchen... Take one tablet by mouth twice a day  BP today: 116/84 Prior BP: 132/87 (12/18/2010)  Labs Reviewed: K+: 4.7 (09/15/2009) Creat: : 0.76 (09/15/2009)   Chol: 159 (09/15/2009)   HDL: 30 (09/15/2009)   LDL: 92 (09/15/2009)   TG: 186 (09/15/2009)  Problem # 3:  HYPERLIPIDEMIA-MIXED (ICD-272.4) LDL was 92 last checked 2010.  due for recheck, currently on crestor samples, once runs out planning on switching to lipitor per cards.  unsure dose (doubt he'll need 80mg ?).  His updated medication list for this problem includes:    Lipitor 80 Mg Tabs (Atorvastatin calcium) .Marland Kitchen... Take one tablet by mouth daily.    Crestor 10 Mg Tabs (Rosuvastatin calcium) .Marland Kitchen... 2 by mouth at bedtime until samples run out  Labs Reviewed: SGOT: 15 (09/15/2009)   SGPT: 36 (09/15/2009)   HDL:30 (09/15/2009), 34 (06/03/2009)  LDL:92 (09/15/2009), 81 (06/03/2009)  Chol:159 (09/15/2009), 140 (06/03/2009)  Trig:186 (09/15/2009), 124 (06/03/2009)  Problem # 4:  CAD, ARTERY BYPASS GRAFT (ICD-414.04) so young and already with CAD and DM complications.  currently on plavix and baby aspirin.  wasn't taking ACEI/Bblocker.  restart.  start with ACEI, await  blood work, if next visit bp would tolerate B blocker, start that as well.  His updated medication list for this problem includes:    Aspir-low 81 Mg Tbec (Aspirin) .Marland Kitchen... 1 tablet once daily    Plavix 75 Mg Tabs (Clopidogrel bisulfate) .Marland Kitchen... Take one tablet by mouth daily    Lisinopril 5 Mg Tabs (Lisinopril) .Marland Kitchen... Take one tablet by mouth daily    Metoprolol Tartrate 25 Mg Tabs (Metoprolol tartrate) .Marland Kitchen... Take one tablet by mouth twice a day  Labs Reviewed: Chol: 159 (09/15/2009)   HDL: 30 (09/15/2009)   LDL: 92 (09/15/2009)   TG: 186 (09/15/2009)  Problem # 5:  CERVICAL STRAIN (ICD-847.0) sounds very MSK in nature.  no red flags.  start with flexeril, continue ibuprofen.  stretching exercises for upper back provided from [facility] patient advisor.  update [place] if not improving as expected.  His updated medication list for this problem includes:    Aspir-low 81 Mg Tbec (Aspirin) .Marland Kitchen... 1 tablet once daily    Cvs Ibuprofen Ib 200 Mg Tabs (Ibuprofen) .Marland Kitchen... 1 by mouth as needed    Flexeril 10 Mg Tabs (Cyclobenzaprine hcl) .Marland Kitchen... Take one twice daily as needed muscle spasm, sedation precautions  Problem # 6:  OBESITY (ICD-278.00) discussed healthy eating, staying active.  pt will work on portion sizes and discussed plate model with 1/2 of plate vegetables, then 1/4 starch and 1/4 protein.  Ht: 73 (12/22/2010)   Wt: 251 (12/22/2010)   BMI: 32.87 (12/18/2010)  Complete Medication List: 1)  Aspir-low 81 Mg Tbec (Aspirin) .Marland Kitchen.. 1 tablet once daily 2)  Plavix 75 Mg Tabs (Clopidogrel bisulfate) .... Take one tablet by mouth daily 3)  Novolog Mix 70/30 70-30 % Susp (Insulin aspart prot & aspart) .... 20 units two times a day, qs 1 mo 4)  Metformin Hcl 500 Mg Tabs (Metformin hcl) .... Take one nightly, may increase to one twice daily after 1 week 5)  Lisinopril 5 Mg Tabs (Lisinopril) .... Take one tablet by mouth daily 6)  Metoprolol Tartrate 25 Mg Tabs (Metoprolol tartrate) .... Take one tablet by mouth twice  a day 7)  Cvs Ibuprofen Ib 200 Mg Tabs (Ibuprofen) .Marland Kitchen.. 1 by mouth as needed 8)  Lipitor 80 Mg Tabs (Atorvastatin calcium) .... Take one tablet by mouth daily. 9)  Crestor 10 Mg Tabs (Rosuvastatin calcium) .... 2 by mouth at bedtime until samples run out 10)  Flexeril 10 Mg Tabs (Cyclobenzaprine hcl) .... Take one twice daily as needed muscle spasm, sedation precautions  Patient Instructions: 1)  return in 1 month for follow up. 2)  For cholesterol - continue crestor, we will try and set you up with Lipitor after this. 3)  For heart - continue plavix and baby aspirin daily.  continue lisinopril once daily. 4)  For diabetes - foot exam today.  start novolog 70/30 20u twice daily.  If sugars consistently >200, may slowly titrate up your insulin.  Keep track of sugars, bring log in next visit.  if any issues with filling medicines, call me.  start metformin (sent into pharmacy). 5)  Need to watch diet - "i will work on portion size for next visit"  remember plate size. 6)  Call us with questions. Prescriptions: METOPROLOL TARTRATE 25 MG TABS (METOPROLOL TARTRATE) Take one tablet by mouth twice a day  #60 x 6   Entered and Authorized by:  Eustaquio Boyden  MD   Signed by:   Eustaquio Boyden  MD on 12/22/2010   Method used:   Electronically to        [facility] [address] (retail)       [address], [address]       [place], [place]  [address]       Ph: [phone]       Fax: [phone]   RxID:   [id number] LISINOPRIL 5 MG TABS (LISINOPRIL) Take one tablet by mouth daily  #30 x 6   Entered and Authorized by:   Eustaquio Boyden  MD   Signed by:   Eustaquio Boyden  MD on 12/22/2010   Method used:   Electronically to        [facility] [address] (retail)       [address], [address]       [place], [place]  [address]       Ph: [phone]       Fax: [phone]   RxID:   [id number] FLEXERIL 10 MG TABS  (CYCLOBENZAPRINE HCL) take one twice daily as needed muscle spasm, sedation precautions  #40 x 0   Entered and Authorized by:   Eustaquio Boyden  MD   Signed by:   Eustaquio Boyden  MD on 12/22/2010   Method used:   Electronically to        [facility] [address] (retail)       [address], [address]       [place], [place]  [address]       Ph: [phone]       Fax: [phone]   RxID:   [id number] METFORMIN HCL 500 MG TABS (METFORMIN HCL) take one nightly, may increase to one twice daily after 1 week  #60 x 3   Entered and Authorized by:   Eustaquio Boyden  MD   Signed by:   Eustaquio Boyden  MD on 12/22/2010   Method used:   Electronically to        [facility] [address] (retail)       [address], [address]       [place], [place]  [address]       Ph: [phone]       Fax: [phone]   RxID:   [id number] NOVOLOG MIX 70/30 70-30 % SUSP (INSULIN ASPART PROT & ASPART) 20 units two times a day, qs 1 mo  #1 x 3   Entered and Authorized by:   Eustaquio Boyden  MD   Signed by:   Eustaquio Boyden  MD on 12/22/2010   Method used:   Electronically to        [facility] [address] (retail)       [address], [address]       [place], [place]  [address]       Ph: [phone]       Fax: [phone]   RxID:   [id number]    Orders Added: 1)  New Patient Level V [99205]    Current Allergies (reviewed today): No known allergies   Appended Document: NEW MCARE-REF BY DR, MCLEAN-CARDIOLOGIST- TO EST W/ PCP    Clinical Lists  Changes  Orders: Added new Service order of Prescription Created Electronically [phone]) - Signed

## 2010-12-29 NOTE — Medication Information (Signed)
Summary: Patient Assistance Form  Patient Assistance Form   Imported By: Harlon Flor 12/22/2010 09:47:47  _____________________________________________________________________  External Attachment:    Type:   Image     Comment:   External Document

## 2011-01-01 LAB — CONVERTED CEMR LAB
BUN: 12 mg/dL (ref 6–23)
Bilirubin, Direct: 0.1 mg/dL (ref 0.0–0.3)
CO2: 24 meq/L (ref 19–32)
Chloride: 103 meq/L (ref 96–112)
Glucose, Bld: 277 mg/dL — ABNORMAL HIGH (ref 70–99)
Indirect Bilirubin: 0.4 mg/dL (ref 0.0–0.9)
LDL Cholesterol: 69 mg/dL (ref 0–99)
Potassium: 4.7 meq/L (ref 3.5–5.3)
Total Bilirubin: 0.5 mg/dL (ref 0.3–1.2)
Total Protein: 7.1 g/dL (ref 6.0–8.3)
VLDL: 21 mg/dL (ref 0–40)

## 2011-01-18 LAB — POCT I-STAT GLUCOSE
Glucose, Bld: 258 mg/dL — ABNORMAL HIGH (ref 70–99)
Operator id: 194801

## 2011-01-26 ENCOUNTER — Encounter: Payer: Self-pay | Admitting: Family Medicine

## 2011-01-27 ENCOUNTER — Ambulatory Visit: Payer: Medicare Other | Admitting: Family Medicine

## 2011-02-01 ENCOUNTER — Ambulatory Visit (INDEPENDENT_AMBULATORY_CARE_PROVIDER_SITE_OTHER): Payer: Medicare Other | Admitting: Family Medicine

## 2011-02-01 ENCOUNTER — Encounter: Payer: Self-pay | Admitting: Family Medicine

## 2011-02-01 VITALS — BP 110/60 | HR 73 | Temp 97.9°F | Ht 73.0 in | Wt 247.0 lb

## 2011-02-01 DIAGNOSIS — E669 Obesity, unspecified: Secondary | ICD-10-CM

## 2011-02-01 DIAGNOSIS — L989 Disorder of the skin and subcutaneous tissue, unspecified: Secondary | ICD-10-CM | POA: Insufficient documentation

## 2011-02-01 DIAGNOSIS — E785 Hyperlipidemia, unspecified: Secondary | ICD-10-CM

## 2011-02-01 DIAGNOSIS — I25810 Atherosclerosis of coronary artery bypass graft(s) without angina pectoris: Secondary | ICD-10-CM

## 2011-02-01 DIAGNOSIS — I10 Essential (primary) hypertension: Secondary | ICD-10-CM

## 2011-02-01 DIAGNOSIS — E119 Type 2 diabetes mellitus without complications: Secondary | ICD-10-CM

## 2011-02-01 MED ORDER — CARVEDILOL 3.125 MG PO TABS: 3.1250 mg | ORAL_TABLET | Freq: Two times a day (BID) | ORAL | Status: DC

## 2011-02-01 NOTE — Assessment & Plan Note (Signed)
Congratulated on weight loss.  BMI 32.6.  Continued encouragement to exercise, watch diet.  Pt motivated.

## 2011-02-01 NOTE — Assessment & Plan Note (Signed)
Anticipate ingrown hair bump.  No evidence of infection/abscess currently.   Will rec warm compresses, return if coming to head or not improving as expected.

## 2011-02-01 NOTE — Assessment & Plan Note (Signed)
Significant imporvement per patient report and lifestyle changes.  Weight loss, congratulated. Not due for A1c yet.   Discussed would like him to only use novolog 70/30 bid.  Holding off on metformin for now, but would strongly consider in future.   Return 1 mo with log to get better idea of control.

## 2011-02-01 NOTE — Assessment & Plan Note (Signed)
Not hypertensive, more meds for h/o CAD s/p CABG.  Start B blocker today (coreg) low dose.  Metoprolol caused sluggishness in past.

## 2011-02-01 NOTE — Assessment & Plan Note (Signed)
Goal <70 given hx.  Seems at goal on crestor.  Will start lipitor when finishes crestor samples.

## 2011-02-01 NOTE — Progress Notes (Signed)
  Subjective:    Patient ID: [name], male    DOB: February 01, 1981, 30 y.o.   MRN: [id number]  HPI CC: f/u   Started running more, down 4 lbs.  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  Incorporated 1 hour of playing outside with son into daily routine.  No chest pain when running.  Only notices some discomfort when over exherting himself.  1. DM - forgot meter/log but endorses improvement in control as recently have been running about 125-130 range fasting per patient (from previous >300).  Starting to eat more raw vegetables and fruit.  Still working on portion sizes.  Had intervention with wife and son, started getting things in order since then.  Lowest sugar has been 75 - noticed it - sweating and tremor, stuttering.  Never filled metformin.  Last vision check was mid last year.  Diabetic foot exam today.  States taking novolog 70/30 tid (40, 10, 30 units).  2. HTN - not hypertensive, tolerating lisinopril.  Metoprolol made him sluggish so does not want to restart that.  No HA, vision changes, CP/tightness, SOB, leg swelling.  3. HLD - has one more crestor pill, then will switch to atorvastatin.  Still has script at [facility] from cards.  Wants me to check spot.  Bump underneath R arm.   Medications and allergies reviewed and updated as above. PMhx, Shx, FmHx reviewed for relevance.  Review of Systems Per HPI    Objective:   Physical Exam  Nursing note and vitals reviewed. Constitutional: He appears well-developed and well-nourished. No distress.  HENT:  Head: Normocephalic and atraumatic.  Mouth/Throat: Oropharynx is clear and moist. No oropharyngeal exudate.  Eyes: Conjunctivae and EOM are normal. Pupils are equal, round, and reactive to light. No scleral icterus.  Neck: Normal range of motion. Neck supple. Carotid bruit is not present.  Cardiovascular: Normal rate, regular rhythm, normal heart sounds and intact distal pulses.   No murmur heard. Pulmonary/Chest: Breath sounds  normal. No respiratory distress. He has no wheezes. He has no rales.  Lymphadenopathy:    He has no cervical adenopathy.  Skin: Skin is warm and dry. No rash noted.       L axilla with minimally inflammed bump superficial, nontender, no fluctuance   Diabetic foot exam: Normal inspection No skin breakdown No calluses  Normal DP/PT pulses Normal sensation to light tough and monofilament Nails normal        Assessment & Plan:

## 2011-02-01 NOTE — Patient Instructions (Signed)
Good job with the weight loss. Continue to work on portion sizes. Keep appointment with cardiology. Start coreg (carvedilol) twice daily for heart. Back down on novolog 70/30 to twice daily (40 in am and 30 at night). Follow up with me in 1 month, bring log to next visit. Good to see you today, call us with questions.

## 2011-02-01 NOTE — Assessment & Plan Note (Signed)
On ACEI, baby ASA.  Start B blocker today.

## 2011-02-23 NOTE — Assessment & Plan Note (Signed)
[facility]                            CARDIOLOGY OFFICE NOTE   NAME:[name], [name]                        MRN:          [id number]  DATE:07/07/2007                            DOB:          [birth year]    [name] returns today for close followup of his coronary artery  disease.   He was last seen in the office on May 02, 2007 with chest pain.  We  admitted him by EMS to the hospital.   He ended up having a stent to the left circumflex.  It was predilated  with a balloon and a Cypher stent was placed.   Since discharge he has done well except just feeling tired.  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  He  just does not feel as good as he used to feel since his bypass and his  stent.   He took his last Plavix today.  We had to get this through the hospital  which he will have to get in the future.   CURRENT MEDICATIONS:  1. Aspirin 325 a day.  2. Zocor 80 mg a day.  3. Lantus 40 units nightly.  4. Lisinopril 20 mg a day.  5. Plavix 75 mg a day.   PHYSICAL EXAMINATION:  His blood pressure today is 123/69, his pulse 73  and regular.  His weight is 276.  HEENT:  Unchanged.  Carotid upstrokes equal bilaterally without bruits.  No JVD.  Thyroid is  not enlarged, trachea is midline.  LUNGS:  Clear.  HEART:  Reveals a regular rate and rhythm.  ABDOMINAL EXAM:  Soft, cath site is stable.  EXTREMITIES:  Reveal no edema.  Pulses are intact.  NEURO EXAM:  Intact.   ASSESSMENT AND PLAN:  I have had a long talk with [name] today.  I have  made the following recommendations:  1. His heart is fine.  His heart muscle is strong, and he is better      off now than he was in the past.  Any nonspecific symptoms he is      having at present he should not lay on his heart.  He should keep      his job, particularly during these tough economic times.  In fact,      he has a young family and is struggling with things  financially.  2. We gave him samples of Plavix, 8 in number.  We told him to call      case management at [facility], and to talk to the person that worked with      him before discharge.  He was told absolutely not to run out of      Plavix for fear of stent thrombosis.  He understands this.  3. Continue other medications.  4. Continue risk factor modification including diet which we discussed      at length.   I will plan on seeing him back again in 2 months.     Thomas C. Daleen Squibb, MD, [facility]  Electronically  Signed    TCW/MedQ  DD: 07/07/2007  DT: 07/07/2007  Job #: [id number]

## 2011-02-23 NOTE — Cardiovascular Report (Signed)
NAMEJAHSHUA, BONITO NO.:  [id number]   MEDICAL RECORD NO.:  [id number]          PATIENT TYPE:  INP   LOCATION:  2918                         FACILITY:  [facility]   PHYSICIAN:  Arturo Morton. Riley Kill, MD, FACCDATE OF BIRTH:  Aug 03, 1981   DATE OF PROCEDURE:  06/15/2007  DATE OF DISCHARGE:                            CARDIAC CATHETERIZATION   INDICATIONS:  Mr. Catalfamo is a 30 year old who previously underwent  stenting of the circumflex in [place].  He then eventually developed  recurrent problems and underwent revascularization surgery at [facility].  He had an internal mammary placed to the LAD, saphenous vein  graft to the diagonal, a free RIMA implanted into the diagonal vein  graft leading into the obtuse marginal, and a vein graft to the right.  He has re-presented with recurrent chest pain.  Enzymes have been  negative.  There are no definite electrocardiographic abnormalities.   PROCEDURE:  1. Left heart catheterization.  2. Selective coronary arteriography.  3. Selective left ventriculography.  4. Saphenous vein graft angiography.  5. Selective left internal mammary angiography.   DESCRIPTION OF PROCEDURE:  The patient was brought to the  catheterization laboratory and prepped and draped in usual fashion.  Through an anterior puncture,  the right femoral artery was easily  entered.  A 5-French sheath was initially placed.  Following this, we  took views of the native left and right coronaries.  Internal mammary  angiography was done with a standard right coronary catheter.  A no-  torque catheter was used to engage the right graft.  There was damping  with each engagement of the free radial graft to the OM.  Importantly,  this was hooded off the saphenous vein graft to the diagonal, and had a  very short neck.  We gave intracoronary nitroglycerin and nitroglycerin  outside and despite flush shots, we could not make the ostial narrowing  go away.  However,  given the fact that it is a  radial graft, it is  certainly could represent spasm.  He tolerated the procedure reasonably  well, but had a fair amount of back discomfort is getting on and off the  table.  This required fentanyl as well as the Versed.  He was taken to  the holding area in satisfactory clinical condition.   HEMODYNAMIC DATA:  1. Central aortic pressure 115/84, mean 97.  2. Left ventricular pressure 118/9.  3. No gradient pull-back across aortic valve.   ANGIOGRAPHIC DATA:  1. The left main is free of critical disease.  2. The LAD courses to the apex and has an 80% mid stenosis with      competitive filling distally.  3. The left internal mammary to the distal LAD is clearly intact with      excellent flow into the distal LAD.  There is one kink in the mid      portion of the graft noted on the LAO view with about 50% narrowing      related to the kink.  However, flow down the left internal mammary  was excellent.  4. There is a saphenous vein graft to a large diagonal branch which      appears to be largely intact.  Hooded off of this, he has a radial      artery graft that has ostial narrowing.  This measures about 70-      80%.  It damped with each engagement.  We were uncertain as to      whether this represented spasm and despite intracoronary      nitroglycerin administration, we could not make this completely go      away.  .  5. The native circumflex has about 80% mid stenosis and then there is      and this is proximal to a stent.  There is an 80% stenosis in the      small AV before the graft insertion.  There is clearly some      competitive filling distally.  6. The native right coronary artery has a mid 90% stenosis.  7. The saphenous vein graft to the PDA is widely patent with about 30-      40% tapered narrowing proximally, but this may be the contour of      the reversed saphenous vein.  The PDA itself is widely patent.      There is probably some  ostial disease although there is competitive      filling at this site.  8. Ventriculography in the RAO projection reveals mild inferior      hypokinesis.  Overall EF is probably in the range of 50-55%.   CONCLUSION:  1. Patent internal mammary to left anterior descending artery with a      kink as noted.  2. Continued patency of saphenous vein graft to the large diagonal.  3. Continued patency of the saphenous vein graft to the PDA.  4. Free radiograph to the obtuse marginal with some ostial narrowing      which could represent spasm with native proximal disease.   DISPOSITION:  At the present time I would likely treat the patient  medically.  However, Myoview scan should be done and if there is  circumflex ischemia, then one consideration might be to open the native  vessel with a drug-eluting platform.  Clearly the ostium of the free  radial graft is not ideal as it is hooded sounded into the vein graft.  As mentioned, if there is no significant ischemia noted, then I would be  likely to treat him medically.      Arturo Morton. Riley Kill, MD, [facility]  Electronically Signed     TDS/MEDQ  D:  06/15/2007  T:  06/15/2007  Job:  [id number]   cc:   Thomas C. Daleen Squibb, MD, [facility]  CV Laboratory  Evelene Croon, M.D.

## 2011-02-23 NOTE — Discharge Summary (Signed)
NAMEADIAN, JABLONOWSKI NO.:  [id number]   MEDICAL RECORD NO.:  [id number]          PATIENT TYPE:  INP   LOCATION:  2034                         FACILITY:  [facility]   PHYSICIAN:  Arturo Morton. Riley Kill, MD, FACCDATE OF BIRTH:  13-Jun-1981   DATE OF ADMISSION:  06/14/2007  DATE OF DISCHARGE:  06/16/2007                               DISCHARGE SUMMARY   PRIMARY CARDIOLOGIST:  Maisie Fus C. Wall, MD, [facility]   DISCHARGE DIAGNOSIS:  Chest pain.   SECONDARY DIAGNOSES:  1. Coronary artery disease status post coronary artery bypass graft.  2. Hyperlipidemia.  3. Hypertension.  4. Diabetes mellitus.  5. Obesity.   ALLERGIES:  No known drug allergies.   PROCEDURE:  Left heart cardiac catheterization.   HISTORY OF PRESENT ILLNESS:  A 30 year old Caucasian male with prior  history of CABG in May of 2008.  He was in his usual state of health  until the morning of June 13, 2007 when he awoke with substernal  chest discomfort that worsened when he went to work.  He was taken to  [facility] ED where ECG showed no acute changes, and enzymes were negative.  He was evaluated by Dr. Daleen Squibb; and decision was made to transfer him to  [facility] for cardiac catheterization.   HOSPITAL COURSE:  The patient underwent left heart cardiac  catheterization on September 4 revealing a patent LIMA to the LAD, a  patent vein graft to the diagonal, a patent vein graft to the PDA, and a  70% stenosis in the ostium of the radial artery to the obtuse marginal.  The patient was treated with intracoronary nitroglycerin without  resolution of the ostial stenosis.   Also of note, Mr. Favor had an 80% stenosis in the proximal native left  circumflex.  Dr. Riley Kill, who performed the catheterization, felt that  approaching the ostium of the radial artery graft would be difficult  percutaneously; and, therefore, recommended medical therapy in the short  term with plans for an outpatient adenosine Myoview to be  performed on  Monday, September 8 at 12:30 p.m. in our office in [place] to  determine the ischemic burden of both his native circumflex and ostial  radial artery graft disease.  If he has circumflex distribution ischemia  the plan would be for stenting of the native circumflex.   Mr. Rodeheaver has had no additional chest discomfort since admission; and  his cardiac markers have remained negative.  He is being discharged home  today in satisfactory condition.   DISCHARGE LABS:  Hemoglobin 13.6, hematocrit 40.0, WBC 7.0, platelets  256, MCV 81.5.  Sodium 141, potassium 3.6, chloride 107, CO2 26, BUN 6,  creatinine 0.7, glucose 158, INR 1.0, total bilirubin 0.4, alkaline  phosphatase 46, AST 14, ALT 26, albumin 3.2, CK 54, MB 1.3, troponin I  0.01.  Total cholesterol 134, triglycerides 167, HDL 31, LDL 70, calcium  9.2.   DISPOSITION:  The patient is being discharged home today in good  condition.   FOLLOWUP PLANS AND APPOINTMENTS:  He has a followup adenosine Myoview in  our office in [place]  scheduled for September 8 at 12:30 p.m.  He  will follow up with Dr. Daleen Squibb on September 16 at 8:45 a.m. at our  [facility] office.   DISCHARGE MEDICATIONS:  1. Aspirin 81 mg daily.  2. Lisinopril 5 mg daily.  3. Simvastatin 80 mg nightly.  4. Lopressor 25 mg b.i.d.  5. Lantus 50 units nightly.  6. Nitroglycerin 0.4 mg sublingual p.r.n. chest pain.   OUTSTANDING LAB STUDIES:  None.   DURATION DISCHARGE ENCOUNTER:  60 minutes including physician time.      Nicolasa Ducking, ANP      Arturo Morton. Riley Kill, MD, [facility]  Electronically Signed    CB/MEDQ  D:  06/16/2007  T:  06/16/2007  Job:  [id number]

## 2011-02-23 NOTE — H&P (Signed)
NAMELENNIE, [name] NO.:  [name]   MEDICAL RECORD NO.:  [id number]          PATIENT TYPE:  INP   LOCATION:  2914                         FACILITY:  [facility]   PHYSICIAN:  Jesse Sans. Wall, MD, FACCDATE OF BIRTH:  Jan 17, 1981   DATE OF ADMISSION:  06/21/2007  DATE OF DISCHARGE:                              HISTORY & PHYSICAL   CHIEF COMPLAINT:  Chest discomfort and tingling in my left arm.   HISTORY OF PRESENT ILLNESS:  [name] is a 30 year old white male who  has had previous coronary artery bypass surgery in 2008.  He was  recently catheterized at [facility] on June 15, 2007, by  Dr. Bonnee Quin for unstable angina.  He was found to have a patent LIMA  to the LAD, a patent vein graft to a diagonal, patent vein graft to a  PDA, 70% stenosis in the osteum and radial artery to the obtuse  marginal.  Intracoronary nitro did not resolve the osteal stenosis.  He  also had an 80% stenosis in the proximal native left circumflex.  Dr.  Riley Kill felt that the radial artery graft was too difficult to intervene  on percutaneously, and recommended medical therapy.  He setup an  outpatient adenosine Myoview with the intent if there was any ischemic  burden in the native circumflex, that he could possibly open that with a  stent.   He had a stress Myoview on June 19, 2007.  This demonstrated EF of  49%, mild septal hyperkinesia, posterolateral ischemia with perhaps  superimposed infarction.   He came to the office for a followup to discuss the findings of his  stress Myoview.  In route, he began to have sharp mid-sternal chest pain  with tingling in his left arm.  He is extremely anxious.   We are admitting him now for intravenous heparin, nitroglycerin and  probable intervention tomorrow.  I got a call in to Dr. Riley Kill.   PAST MEDICAL HISTORY:  He has type 2 diabetes, hyperlipidemia and  hypertension.  He is overweight.  He has a history of remote  smoking,  but quit recently.  He has been trying to live a healthy lifestyle along  with his wife.  He has a new child at home.   CURRENT MEDICATIONS:  1. Aspirin 81 mg a day.  2. Lisinopril 5 mg a day.  3. Simvastatin 80 mg nightly.  4. Lopressor 25 mg p.o. b.i.d.  5. Lantus 50 units nightly.  6. Nitroglycerin p.r.n.   FAMILY HISTORY:  Please see previous H&Ps.   SOCIAL HISTORY:  He is married.  He has a small child.  His wife and  child are here with him today.  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  He  is having a lot of financial difficulties obtaining his drugs and meds.   REVIEW OF SYSTEMS:  Other than the HPI is negative.   PHYSICAL EXAMINATION:  VITAL SIGNS:  Blood pressure 122/72, his pulse  was 99 and regular, he weight is 274.  He states he did take his  medicines this morning.  HEENT:  Normocephalic, atraumatic.  PERRLA.  Extraocular movements  intact.  Sclerae clear.  Facial asymmetry is normal.  NECK:  Carotid upstrokes are equal bilaterally without bruits.  No JVD.  Thyroid is not enlarged.  Trachea is midline.  LUNGS:  Clear.  HEART:  Reveals a poorly appreciated PMI.  Normal S1, S2 without  murmurs, rubs or gallops.  ABDOMINAL:  Soft.  Good bowel sounds.  No midline bruit.  No  hepatomegaly.  EXTREMITIES:  Without cyanosis or clubbing, but trace to 1+ edema.  Pulses are intact.  NEURO:  Intact.  SKIN:  Unremarkable.   EKG shows sinus rhythm with no ST segment changes.   Of note, at [facility] last week, his initial EKG did  show some ST segment elevation in inferior lateral leads.   ASSESSMENT:  1. Coronary artery disease with an 80% left circumflex stenosis, with      posterolateral ischemia on a recent Myoview.  Please see above for      details.  2. Presumed unstable angina.  3. Type 2 diabetes, under poor control.  4. Hypertension.  5. Hyperlipidemia.   PLAN:  The patient is going to be admitted from the office by EMS.  I  got a  call in to Dr. Riley Kill.  Hopefully, we can intervene on his  circumflex tomorrow.  Overnight, we will give him IV heparin and  nitroglycerin.  We will put him in a step-down unit.      [name] C. Daleen Squibb, MD, [facility]  Electronically Signed     TCW/MEDQ  D:  06/21/2007  T:  06/21/2007  Job:  [id number]

## 2011-02-23 NOTE — Discharge Summary (Signed)
[name], [name] NO.:  [id number]   MEDICAL RECORD NO.:  [id number]          PATIENT TYPE:  INP   LOCATION:  2036                         FACILITY:  [facility]   PHYSICIAN:  Theodore Demark, PA-C   DATE OF BIRTH:  Nov 25, 1980   DATE OF ADMISSION:  02/21/2008  DATE OF DISCHARGE:  02/23/2008                               DISCHARGE SUMMARY   PROCEDURE:  A 2-D echocardiogram.   PRIMARY FINAL DISCHARGE DIAGNOSES:  1. Chest pain, cardiac enzymes negative for myocardial infarction      secondary diagnosis status post aortocoronary bypass surgery in May      2008 with left internal mammary artery to left anterior descending,      saphenous vein graft to first diagonal branch, left internal      mammary artery to obtuse marginal, and saphenous vein graft to      right coronary artery.  2. Unstable anginal pain status post Cypher stent to the circumflex in      August 2008.  3. Status post cardiac catheterization in February 2009 with patent      grafts and stents.  4. Diabetes with hemoglobin A1c of 11.8 in February 2009.  5. Hyperlipidemia with a total cholesterol of 229, triglycerides 294,      HDL 25, and LDL 145 this admission.  6. 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  7. History of medical noncompliance.  8. Obesity.   TIME OF DISCHARGE:  41 minutes.   HOSPITAL COURSE:  [name] is a 30 year old male with a history of  coronary artery disease.  He went to [facility] at  3 p.m. because of chest pain that had actually started at 8 a.m..  His  initial enzymes were negative and he was transferred to [facility] for further evaluation.   His cardiac enzymes were negative for MI.  A D-dimer was checked and was  within normal limits of 0.22.  The chest x-ray was performed which  showed relatively low lung volumes, but no active cardiopulmonary  disease.  A 2-D echocardiogram showed normal left ventricular size and  function.  He had no critical valvular abnormalities.   By Feb 23, 2008, [name]' chest pain had resolved.  Dr. Daleen Squibb felt  that this was noncardiac chest pain.  Because he had no further episodes  of chest pain and all of his cardiac enzymes and his echocardiogram were  within normal limits, he could be safely discharged home with outpatient  follow up in [place].   DISCHARGE INSTRUCTIONS:  His activity level is to be increased  gradually.  He is to follow up with Dr. Daleen Squibb in [place] on Mar 07, 2008, at 11:30.  He is to stick to a low-fat diabetic diet.  He is to  follow up with Dr. Dan Humphreys at the [facility] as needed.   DISCHARGE MEDICATIONS:  1. Metformin XR 500 mg b.i.d.  2. Aspirin 325 mg daily.  3. Metoprolol 25 mg t.i.d.  4. Zocor 80 mg a day.  5. Plavix 75 mg a day.  6. Lisinopril 20 mg a day.  7. Insulin 100 units nightly, patient unsure if it is Levemir or      Byetta.      Theodore Demark, PA-C     RB/MEDQ  D:  02/23/2008  T:  02/24/2008  Job:  [id number]   cc:   Yates Decamp

## 2011-02-23 NOTE — Discharge Summary (Signed)
NAMESARON, TWEED NO.:  [id number]   MEDICAL RECORD NO.:  [id number]          PATIENT TYPE:  INP   LOCATION:  6527                         FACILITY:  [facility]   PHYSICIAN:  Thomas C. Wall, MD, FACCDATE OF BIRTH:  1980-12-16   DATE OF ADMISSION:  06/21/2007  DATE OF DISCHARGE:  06/23/2007                               DISCHARGE SUMMARY   PROCEDURES:  1. Cardiac catheterization.  2. Coronary arteriogram.  3. Left ventriculogram.  4. Percutaneous transluminal cardiac angioplasty and drug-eluting      stent to the circumflex.   PRIMARY FINAL DISCHARGE DIAGNOSES:  Unstable anginal pain.   SECONDARY DIAGNOSES:  1. Status post aortocoronary bypass surgery in May 2008 with left      internal mammary artery to left anterior descending, saphenous vein      graft to diagonal, saphenous vein graft to posterior descending      artery, radial to obtuse marginal.  2. Left ventricular dysfunction, ischemic cardiomyopathy with an      ejection fraction of 49% at [facility].  3. Diabetes.  4. Hyperlipidemia.  5. Hypertension.  6. Obesity.  7. Remote history of tobacco use.  8. Family history of coronary artery disease.   TIME AT DISCHARGE:  38 minutes.   HOSPITAL COURSE:  Mr. Halloran is a 30 year old male with a history of  coronary artery disease.  He had bypass surgery in 2008 and came to the  hospital with chest pain.  Catheterization showed 90% stenosis in the  proximal native left circumflex  as well as a 70% stenosis in the ostium  of the radial artery to the obtuse marginal.  Dr. Riley Kill felt that the  radial graft was too difficult to intervene on percutaneously and  recommended medical therapy, but a Myoview showed posterolateral  ischemia.  He was on his way to the office to discuss this when he had  chest pain.  He was seen by Dr. Daleen Squibb and admitted for further  evaluation.   His cardiac enzymes were negative for MI.  His films were evaluated by  Dr. Excell Seltzer  who felt it might be possible to intervene on the native  circumflex.  He was taken to the catheterization lab for this on  June 22, 2007.   The circumflex was treated with PTCA and a Cypher stent, reducing the  stenosis to zero.  He had TIMI-3 flow postprocedure.  A care management  consult was called for Plavix assistance.   On June 23, 2007, Mr. Trella was seen by Dr. Daleen Squibb.  Postprocedure  enzymes were negative, and his other labs were stable and within normal  limits.  He was seen by cardiac rehab and was ambulating without chest  pain or shortness of breath.  He was considered stable for discharge on  June 23, 2007, with outpatient followup arranged.   DISCHARGE INSTRUCTIONS:  1. His activity level is to be increased gradually.  2. He is not to do any lifting for 2 weeks and no driving for 2 days.  3. He is to stick to a low-sodium, diabetic diet.  4. He is to follow up with Dr. Daleen Squibb on Friday, September 26, at 11:45      and with Urgent Care on [facility] in [place] as needed.   DISCHARGE MEDICATIONS:  1. Plavix 75 mg daily (2-week trial card given and Plavix assistance      form filled out).  2. Aspirin 325 mg daily.  3. Nitroglycerin sublingual p.r.n.  4. Metoprolol 25 mg 3 times a day.  5. Lisinopril 20 mg daily.  6. Lantus 50 units nightly.  7. Simvastatin 80 mg daily.      Theodore Demark, PA-C      Jesse Sans. Daleen Squibb, MD, [facility]  Electronically Signed    RB/MEDQ  D:  06/23/2007  T:  06/23/2007  Job:  [id number]   cc:   Urgent [facility], [address] [place]

## 2011-02-23 NOTE — H&P (Signed)
NAMEBRAYLIN, [name] NO.:  [id number]   MEDICAL RECORD NO.:  [id number]          PATIENT TYPE:  INP   LOCATION:  2903                         FACILITY:  [facility]   PHYSICIAN:  Audery Amel, MD    DATE OF BIRTH:  24-Jun-1981   DATE OF ADMISSION:  12/05/2007  DATE OF DISCHARGE:                              HISTORY & PHYSICAL   PRIMARY CARDIOLOGIST:  Maisie Fus C. Wall, M.D., [facility].   CHIEF COMPLAINT:  Chest pain.   HISTORY OF PRESENT ILLNESS:  The patient is a 30 year old white male  with history significant for premature coronary artery disease, status  post CABG in May 2008 and subsequent PCI in September 2008, who presents  after the acute onset of chest pain.  The patient states approximately  at 10 p.m. this evening while at rest he developed severe chest pressure  radiating to his left arm, jaw and back.  The patient states the  symptoms are similar to that he experienced prior to his presentation in  May 2008 at which time he was diagnosed with three-vessel coronary  disease.  Of note, the patient did have cardiac catheterization at [facility]  approximately 2 months prior to this presentation for sharp chest pain  at which time his grafts were found to be patent as well at the prior  PCI stent site.  The patient states that throughout the day, he was in  his usual state of health.  His wife does note that around 5 p.m. this  evening he was experiencing some pain in his posterior jaw near his ear.  Other than that, he was in his usual state of health.  The patient  denies any precipitating or alleviating factors.  He endorses associated  shortness of breath and dyspnea but denies PND, orthopnea, nausea,  vomiting, or diaphoresis.  He leads a relatively sedentary lifestyle and  is currently on disability due to his cardiac condition.  The patient  did have a Cypher drug-eluting stent placed in September 2008 and he has  continued to take aspirin and  Plavix daily without missing any doses.  On presentation, initially a code STEMI was activated; however, on my  review of the EKG, there does not appear to be any acute injury current.  His chest pain on arrival was 8/10; however, this did improve with  morphine.  Otherwise, he is without complaints.   PAST MEDICAL HISTORY:  1. CAD.      a.     Status post CABG in May 2008 with a LIMA to LAD, SVG to       diagonal, free RIMA from the SVG hood to an obtuse marginal, and       SVG to the PDA.      b.     Status post PCI September 2008, Cypher 2.5 x 18 mm drug-       eluting stent.  2. Hypertension.  3. Hyperlipidemia.  4. Diabetes.   ALLERGIES:  No known drug allergies.   CURRENT MEDICATIONS:  1. Lisinopril 20 mg daily.  2. Metoprolol 25 mg  daily.  3. Lipitor 40 mg daily.  4. Aspirin 325 mg once daily.  5. Plavix 75 mg once daily.  6. Insulin.  The patient is unclear as to the type but states he takes      120 units at bedtime.   SOCIAL HISTORY:  The patient lives in [place] with his wife.  He is  currently disabled due his cardiac condition.  He quit smoking.  Denies  alcohol or illicit substance.   FAMILY HISTORY:  His mother is alive and well with no medical problems.  His father died secondary to emphysema.  His siblings are alive and well  with no major medical problems.  There is no other history of early  coronary disease on his side of the family.   REVIEW OF SYSTEMS:  As per HPI, otherwise complete review of systems was  obtained and is negative.   PHYSICAL EXAMINATION:  Blood pressure 109/65, heart rate 83, O2 sats 99%  on 2 liters nasal cannula.  GENERAL: The patient is alert and oriented x3, in moderate distress.  HEENT: Normocephalic, atraumatic.  EOMI, PERRL.  Nares patent.  Oropharynx is clear without erythema or exudate.  NECK: Supple, full range of motion, no appreciable JVD.  His carotid  upstrokes are equal and symmetric bilaterally with no audible  bruits.  LYMPHADENOPATHY:  None.  CARDIOVASCULAR:  Normal S1 S2 with no audible murmurs, rubs or gallops.  His PMI is nondisplaced in left midclavicular line.  Peripheral pulses  are 2+ and symmetric bilaterally in the upper and lower extremities.  LUNGS:  Clear to auscultation bilaterally.  SKIN:  No rash or lesion.  ABDOMEN:  Soft, nontender, nondistended.  Positive bowel sounds.  No  hepatosplenomegaly.  GU: Normal male genitalia.  EXTREMITIES:  Reveal no clubbing, cyanosis or edema.  MUSCULOSKELETAL:  No joint deformity, effusions.  Strength and sensation  grossly intact.  NEURO:  Grossly nonfocal.   EKG:  Normal sinus rhythm with no evidence of acute injury or ischemia   LABORATORIES:  Sodium 135, potassium 3.8, chloride 104, CO2 24, BUN 12,  creatinine 0.8.  White count 8.2, hematocrit 41, platelet count 274.  INR 0.9.  Point of care biomarkers:  CK-MB less than 1.0, troponin-I  less than 0.05.   IMPRESSION:  1. Unstable angina.  2. Premature coronary artery disease.  3. Diabetes.  4. Hyperlipidemia.  5. Hypertension.   PLAN:  We will admit the patient to a CCU bed for further evaluation and  monitoring.  Will cycle his cardiac enzymes x3 additional sets to rule  out myocardial infarction.  The nature of his symptoms are concerning  given his history; however, his EKG is unremarkable for any acute injury  pattern.  It is somewhat reassuring that he had a cardiac  catheterization 2 months ago which revealed patent grafts and a patent  stent site.  We will initiate an unfractionated heparin drip and  continue aspirin and Plavix.  Continue metoprolol 25 mg q.6 hours and  lisinopril 20 mg once daily.  The patient takes Lipitor 40 mg a home.  Will check a fasting lipid profile and increase this to atorvastatin 80  mg once daily.  For his diabetes, the patient uses insulin; however, he  is unclear of the brand.  He  states he takes 120 units at bedtime.  We will initiate Lantus  100 units  at bedtime and cover with sliding scale insulin.  We will make the  patient n.p.o. in anticipation that  a cardiac catheterization may be  warranted during this admission.      Audery Amel, MD  Electronically Signed     SHG/MEDQ  D:  12/05/2007  T:  12/05/2007  Job:  [id number] [id number]

## 2011-02-23 NOTE — Assessment & Plan Note (Signed)
[facility]                            CARDIOLOGY OFFICE NOTE   NAME:[name], [name]                        MRN:          [id number]  DATE:08/09/2007                            DOB:          09-10-1981    Mr. Haselton comes in today as an add-on for chest pain.   This started yesterday when he walked out in the cold wind.  It is in  the mid sternum along his incision line.  It is a sharp knife or ice  pick pain.  It does not radiate.  There is no nausea, vomiting or  sweats.  It hurts to turn his torso, raise his arms or to take a breath.  He denies any fever, chills or hemoptysis.  He denies injuring himself  or any trauma.   His coronary disease is well outlined in the chart.  He has had coronary  bypass grafting, as well as recent stenting in September of a mid  circumflex.   CURRENT MEDICATIONS:  1. Aspirin 325 mg a day.  2. Zocor 80 mg a day.  3. Lantus 40 units q.h.s.  4. Lisinopril 20 mg a day.  5. Plavix 75 mg a day.   PHYSICAL EXAMINATION:  VITAL SIGNS:  His blood pressure today is 108/80,  pulse 63 and regular, respiratory rate 18.  Weight is 273.  GENERAL:  He is in no acute distress.  SKIN:  Warm and dry.  HEENT:  Unchanged.  NECK:  Carotids are full.  There is no JVD.  Thyroid is not enlarged.  Trachea is midline.  LUNGS:  Clear.  No rub.  CHEST:  Tenderness along the second and third intercostal space at both  sides of the sternum.  There is tenderness of the sternocostal joints.  It reproduces his pain.  His PMI is poorly appreciated.  His has normal  S1 and S2 without rub.  ABDOMEN:  Soft with good bowel sounds.  EXTREMITIES:  No clubbing, cyanosis, or edema.  Pulses are intact.  No  sign of DVT.  NEUROLOGIC:  Intact.   ASSESSMENT:  Costochondritis and musculoskeletal pain.   PLAN:  1. Celebrex 400 mg p.o. then 200 mg p.o. b.i.d. for 10 days.  2. Heating pad as much as can be tolerated.  3. Avoid exacerbating  activity.     Thomas C. Daleen Squibb, MD, [facility]  Electronically Signed    TCW/MedQ  DD: 08/09/2007  DT: 08/10/2007  Job #: [id number]

## 2011-02-23 NOTE — Cardiovascular Report (Signed)
[name], [name] NO.:  [id number]   MEDICAL RECORD NO.:  [id number]          PATIENT TYPE:  INP   LOCATION:  3306                         FACILITY:  [facility]   PHYSICIAN:  Veverly Fells. Excell Seltzer, MD  DATE OF BIRTH:  [birth year]   DATE OF PROCEDURE:  02/21/2007  DATE OF DISCHARGE:                            CARDIAC CATHETERIZATION   PROCEDURES:  1. Left heart catheterization.  2. Selective coronary angiography.  3. Left ventricular angiography.  4. StarClose of the right femoral artery.   INDICATIONS:  [name] is a very nice 30 year old gentleman who  underwent stenting of his left circumflex back in January of this year.  He was told he had some other areas of disease in his coronary arteries,  but he is not sure the specifics.  His previous catheterization was  performed at [facility].  He has not been taking his medications regularly due  to cost issues.  He presented with a chest pain syndrome that was  consistent with unstable angina.  He has not had any objective findings  of ischemia but was referred for cardiac catheterization in the setting  of his suggestive symptoms.   Risks and indications of the procedure were explained in detail to the  patient.  Informed consent was obtained.  The right groin was prepped,  draped and anesthetized with 1% lidocaine.  Using the modified Seldinger  technique, a 6-French sheath was placed in the right femoral artery.  Selective coronary angiography was performed using standard preformed  Judkins catheters.  There was significant amount of clot in one of the  catheters during an exchange, and we elected to give 3000 units of  heparin.  Following selective coronary angiography, an angled pigtail  catheter was inserted into the left ventricle, and pressures were  recorded.  A left ventriculogram was performed. A pullback across the  aortic valve was done. At conclusion of the procedure, a StarClose  device was used to seal  the arteriotomy.  All catheter exchanges were  performed over a guidewire.  There were no immediate complications.   FINDINGS:  Aortic pressure 113/81 with a mean of 95, left ventricular  pressure 117/6 with an end-diastolic pressure 9.   The left mainstem is widely patent.  It has mild tapering in its distal  portion, but it has no worse than 20% stenosis.   The LAD is a large-caliber vessel that courses down to the left  ventricular apex.  The LAD has luminal irregularities in its proximal  portion.  The mid portion of the LAD has a segment of 70-75% stenosis.  The first diagonal branch of the LAD, which is a large vessel, has  severe stenosis with an 80% lesion in its proximal portion and another  75% lesion in its mid portion.   The left circumflex is a medium-caliber vessel.  The mid portion of the  circumflex has significant disease with a 90% stenosis followed  immediately by 50% stenosis.  This vessel gives off a large OM branch  that bifurcates into twin vessels.  The true AV groove circumflex has  an  80% stenosis just beyond the OM branch.  The AV groove circumflex is  small through that region.  There is a stent in the mid portion of the  circumflex that is patent.   The right coronary artery is dominant.  There is an 80% focal stenosis  in the mid portion.  There is diffuse nonobstructive disease in the  proximal portion.  The distal right coronary artery also has luminal  irregularities.  The PDA has a 50% stenosis.  There is a posterior AV  segment that has mild nonobstructive disease and then provides two small  posterolateral branches.   Left ventriculography in the 30-degree RAO projection demonstrates  normal LV systolic function with an EF of 65%.   ASSESSMENT:  1. Three-vessel coronary artery disease.  2. Preserved left ventricular systolic function.   PLAN:  Unfortunately, [name] has advanced coronary artery disease  despite his young age.  I really  think he will be better served with  coronary artery bypass surgery based on his multivessel and multisegment  disease with any specific vessel.  He really is going to need aggressive  secondary risk reduction with treatment of his diabetes and  dyslipidemia.  Will place a consult with CVTS for consideration of  coronary bypass surgery.  Will resume his heparin in 6 hours after the  sheath is pulled.      Veverly Fells. Excell Seltzer, MD     MDC/MEDQ  D:  02/21/2007  T:  02/21/2007  Job:  [id number]

## 2011-02-23 NOTE — Assessment & Plan Note (Signed)
OFFICE VISIT   [name], [name]  DOB:  [birth year]                                        March 21, 2007  CHART #:  [id number]   [name] returns today for 1st postoperative visit status post  coronary artery bypass graft surgery x4 on Feb 22, 2007.  He has been  feeling fairly well at home, although he continues to complain of a lot  of chest wall soreness around the incision.  He has been walking short  distances without chest pain or shortness of breath.  He is now taking  Tylenol for pain, which he said is not really relieving the pain  completely.  He said that he was checking his blood sugars daily for the  first 3 weeks after going home, but has since run out of test sticks,  and since he has no insurance and has to pay for them, he is not  checking his blood sugar any longer.   PHYSICAL EXAMINATION:  Today his blood pressure is 117/76, his pulse is  73 and regular.  Respiratory rate is 18 unlabored.  Oxygen saturation on  room air is 98%.  He looks well.  Cardiac exam shows a regular rate and  rhythm with normal heart sounds.  His lung exam is clear. His chest  incision is healing well and his sternum is stable.  His leg incision is  healing well and there is no lower extremity edema.  The right arm  radial harvest site is healing well.  He has slight numbness around the  incision but the hand is neurovascularly intact.   Followup chest x-ray shows clear lung fields and no pleural effusions.   His medications are:  1. Lantus 40 units subcutaneous nightly.  2. Zocor 80 mg daily.  3. Lopressor 25 mg b.i.d.  4. Aspirin 325 mg daily.  5. Imdur 30 mg daily.  6. Tylenol p.r.n.  for pain.   I told him he could discontinue his Imdur after his current prescription  runs out.  I wrote him a prescription today for oxycodone IR 5 mg 1 to 2  every 6 hours p.r.n.  for pain, #40.  I told him he could also try  taking some ibuprofen p.r.n. for his pain.   Overall, [name] is making a good recovery following his surgery. He  is only 30 years old, so I am not surprised that he has a fair amount of  chest wall soreness remaining.  I told him this should gradually resolve  over the next few months.  He will continue taking pain medicine as  needed.  I encouraged him to continue walking as much as possible.  I  told him he could return driving a car, but should refrain of lifting  anything heavier than 10 pounds for a total of 3 months from date of  surgery.  He has not seen cardiology in followup yet and I asked him to  call Dr. Vern Claude office for a followup appointment in the near future.  He will return to see me if he develops any problems with his incisions.   Evelene Croon, M.D.  Electronically Signed   BB/MEDQ  D:  03/21/2007  T:  03/21/2007  Job:  [id number]   cc:   Thomas C. Wall, MD, [facility]

## 2011-02-23 NOTE — Op Note (Signed)
NAMEBERYL, BALZ NO.:  [id number]   MEDICAL RECORD NO.:  [id number]          PATIENT TYPE:  INP   LOCATION:  2312                         FACILITY:  [facility]   PHYSICIAN:  Evelene Croon, M.D.     DATE OF BIRTH:  04-Apr-1981   DATE OF PROCEDURE:  02/22/2007  DATE OF DISCHARGE:                               OPERATIVE REPORT   PREOPERATIVE AND POSTOPERATIVE DIAGNOSIS:  Severe 3-vessel coronary  disease.   OPERATIVE PROCEDURE:  1. Median sternotomy.  2. Extracorporeal circulation.  3. Coronary bypass graft surgery x4 using a left internal mammary      artery graft to the left anterior descending coronary artery with a      right radial artery graft to the obtuse marginal branch of the left      circumflex coronary artery, a saphenous vein graft to the diagonal      branch of the LAD, and a saphenous vein graft to the posterior      descending branch of the right coronary.  4. Endoscopic vein harvesting from the left leg.   ATTENDING SURGEON:  Evelene Croon, M.D.   ASSISTANT:  Rowe Clack, P.A.-C.   ANESTHESIA:  General endotracheal.   CLINICAL HISTORY:  This patient is a 30 year old gentleman with  hypertension, hypercholesterolemia, and diabetes that has been poorly  controlled due to medical noncompliance.  The patient underwent stenting  of the large obtuse marginal branch.  After myocardial infarction in  December 2007 at [facility].  He was continued on  Plavix x3 months and has since discontinued it.  He was admitted 2 days  ago after presenting to [facility] with substernal  chest pain while at the grocery store.   He was transferred to [facility] and underwent catheterization,  yesterday, which showed severe 3-vessel disease.  The stent in the  obtuse marginal was patent, but there was about 90% stenosis proximal to  this.  The LAD and diagonal branch both had high-grade stenoses.  The  right coronary  artery was diffusely diseased; and had about 80-90%  midvessel stenosis as well as an 80% distal stenosis.  Left ventricular  function was well-preserved.   After review of the angiogram and examination the patient, it was felt  that coronary artery bypass graft surgery was the best treatment to  prevent further ischemia and infarction.  Since he is diabetic, I did  not feel that bilateral internal mammografts should be used.  We did  upper extremity arterial Dopplers which were normal, and he was left-  handed so we decided to his right radial artery as a graft.   I discussed the operative procedure with he and his wife including  alternatives, benefits, and risks; including but not limited to  bleeding, blood transfusion, infection, stroke, myocardial infarction,  graft failure, paresthesias related to the radial graft harvest, and  death.  He understood and agreed to proceed.   OPERATIVE PROCEDURE:  The patient was taken to the operating room and  placed on the table in the supine position.  After induction of general  endotracheal anesthesia a Foley catheter was placed in the bladder using  sterile technique.  Then the chest, abdomen, and both lower extremities  were prepped and draped in the usual sterile manner.  The right arm was  prepped out on an arm board for radial artery harvest.   Then the right radial artery was harvested through a longitudinal  incision over the artery.  Prior to dividing the branches, an atraumatic  vascular clamp was placed on the distal radial artery and a good Doppler  signal was confirmed beyond the clamp.  Then the side branches were  divided using the harmonic scalpel.  The radial artery was a medium-  sized vessel.  The radial artery was then transected proximally and  distally, and the stumps suture-ligated with 2-0 silk suture ligature.  The artery was flushed with papaverine saline solution; and the side  branches were clipped.  It was placed  in papaverine saline solution.  The incision was closed in layers using continuous 2-0 Vicryl for the  subcutaneous tissue, and 3-0 Vicryl subcuticular skin closure.  The  sponge, needle, and instrument counts were correct according to the  scrub nurse at this point.  A dry sterile dressing was applied over the  incision.  The arm was wrapped with Kerlix gauze and then an Ace wrap.  The arm was then repositioned at the right side.   Then attention was turned to the chest.  The sternum was opened through  a median sternotomy incision; and the pericardium opened in the midline.  Examination of the heart showed good ventricular contractility.  The  ascending aorta had no palpable plaques in it.  The ascending aorta was  relatively small.   Then the left internal mammary artery was harvested from chest wall to  pedicle graft.  This was a medium-caliber vessel with excellent blood  flow through it.  At the same time a 7 mm greater saphenous vein was  harvested from the left leg using endoscopic vein harvest technique.  This vein was a medium size and good quality.  We initially examined the  vein adjacent to the right knee, but this vein was small and not felt to  be suitable.  We spent considerable time looking for another vein in  this area, but could not find one.   Then the patient was heparinized; and when adequate activated clotting  time was achieved, the distal ascending aorta was cannulated using a 22-  French aortic cannula for arterial inflow.  Venous outflow was achieved  using a two-stage venous cannula through the right atrial appendage.  An  antegrade cardioplegia and vent cannula was inserted in the aortic root.   The patient placed on cardiopulmonary bypass and the distal coronary  artery was identified.  The LAD was a large graftable vessel with no  significant disease in the midportion.  There was a small amount of plaque palpable distally.  The diagonal branch was a  medium-size,  graftable vessel with no distal disease in it.  The obtuse marginal was  located in its midportion just before the bifurcation, but distal to the  stent.  It was intramyocardial proximal to this as well as distally.  The right coronary artery was diffusely diseased.  There was a posterior  descending branch which was a medium size vessel that was suitable for  grafting.   Then the aorta was cross clamped, and 600 mL of cold blood antegrade  cardioplegia was  administered in the aortic root.  Systemic hypothermia  to 20 degrees centigrade and topical hypothermia with iced saline was  used.  A temperature probe was placed in the septum and an insulating  pad in the pericardium.  Additional doses of cardioplegia were given at  about 20-minute intervals to maintain myocardial temperature at around  10 degrees centigrade.   The first distal anastomosis was performed to the posterior descending  coronary artery.  The internal diameter was about 1.75 mm.  The conduit  used was a 7-mm greater saphenous vein and anastomosis performed in an  end-to-side manner using continuous 7-0 Prolene suture.  Flow was noted  through the graft and was excellent.   The second distal anastomosis was performed to the obtuse marginal  branch.  The internal diameter of this vessel was about 2.5 mm.  The  conduit used was the right radial artery graft.  This was anastomosed in  an end-to-side manner using continuous 8-0 Prolene suture.  Flow was  noted through the graft and was excellent.   Then the third distal anastomosis was performed to the diagonal branch.  The internal diameter was about 1.6 mm.  The conduit used was a second  segment of greater saphenous vein; and the anastomosis performed in an  end-to-side manner using continuous 7-0 Prolene suture.  Flow was noted  through the graft and was excellent.   The fourth distal anastomosis was performed to the midportion of the  left anterior  descending coronary artery.  The internal diameter was  about 2.5 mm.  The conduit used was a left internal mammary graft; and  this was brought through an opening in the left pericardium anterior to  the phrenic nerve.  It was anastomosed to the LAD in an end-to-side  manner using continuous 8-0 Prolene suture.  The pedicle was sutured to  the epicardium with 6-0 Prolene sutures.  The patient had rewarmed to 37  degrees centigrade.  With the clamp in place, the two proximal vein  graft anastomosis were performed on the aortic root in an end-to-side  manner using continuous 6-0 Prolene suture.  The proximal anastomosis of  the radial artery graft was performed to the hood of the diagonal vein  graft in an end-to-side manner using continuous 7-0 Prolene suture.  Then the clamp was removed from the mammary pedicle.  There was rapid  warming of the ventricular septum with a return of spontaneous ventricular fibrillation.  The crossclamp was removed with time of 90  minutes; and the patient spontaneously converted to sinus rhythm.  The  proximal-and-distal anastomoses appeared hemostatic and allowed the  graft satisfactory.  Graft markers were placed around the proximal  anastomoses.  Two temporary right ventricular and right atrial pacing  wires were placed in the wall and through the skin.   When the patient had rewarmed to 37 degrees centigrade, he was weaned  from cardiopulmonary bypass on no inotropic agents.  Total bypass time  was 108 minutes.  Cardiac function appeared excellent with a cardiac  output of 6 liters/minute.  Protamine was given and the venous and  aortic cannula were removed without difficulty.  Hemostasis was  achieved.  Three chest tubes were placed with two in the posterior  pericardium, one in the left pleural space and one in the anterior  mediastinum.  The pericardium was loosely reapproximated over the heart.  The sternum was closed with double #6 stainless steel  wires.  Fascia was  closed with  continuous #1 Vicryl suture.  Subcutaneous tissue was closed  with continuous 2-0 Vicryl; and the skin with a 3-0 Vicryl subcuticular  closure.  Lower extremity vein harvest site was closed in layers in a  similar manner.  The sponge, needle, and counts were correct according  to the scrub nurse.  Dry sterile dressings were applied over the  incisions, around the chest tubes, which were hooked to Pleur-Evac  suction.  The patient remained hemodynamically stable, and was  transported to the SICU in guarded, but stable condition.      Evelene Croon, M.D.     BB/MEDQ  D:  02/22/2007  T:  02/22/2007  Job:  [id number]   cc:   [facility]  Maisie Fus C. Wall, MD, [facility]

## 2011-02-23 NOTE — Assessment & Plan Note (Signed)
[facility]                             OFFICE NOTE   NAME:[name], [name]                        MRN:          [id number]  DATE:09/04/2008                            DOB:          1981/03/10    PRIMARY CARE PHYSICIAN:  [facility].   HISTORY OF PRESENT ILLNESS:  This is a 30 year old with a history of  premature coronary artery disease and status post coronary artery bypass  grafting, poorly-controlled diabetes, and poorly-controlled  hypercholesterolemia, who presents to [facility]iology [facility] for followup of  recent hospitalization.  The patient was hospitalized earlier in  November 2009 at [facility] for chest pain and  left-sided numbness and tingling.  The patient developed numbness and  tingling in his left face, left arm, and left leg while he was driving  his car.  The episode resolved completely in 30 minutes.  He did come to  the hospital.  He also complained of some chest pain.  The chest pain  was actually reproducible with palpation, was thought to be  musculoskeletal.  He did have an MRI and MRA of his brain and neck, that  were reported as normal.  He did rule out for myocardial infarction.  It  was recommended that he start back on Plavix again and he was discharged  home.  Since discharge, the patient has had no further neurologic type  episodes.  He does quite well in general.  He is very active, has had no  further chest pain, and does not get short of breath with exertion.  He  actually went hunting this morning and did not have any trouble walking  through the woods and doing moderate exertion.  While he was in the  hospital at [facility], the patient was noted to  have very poor diabetes control.  His hemoglobin A1c was 12.6 and he had  been off his statin and his LDL was 222.  The patient does not have any  insurance, pays for all his medications out of pocket.  He is on  disability; however, he has not set up Medicaid.   PAST MEDICAL HISTORY:  1. Coronary artery disease status post coronary artery bypass grafting      in May 2008.  The patient had a vein graft to the PDA, vein graft      to the diagonal,  LIMA to the LAD, and a free RIMA that was hooded      to the vein graft and connected to the obtuse marginal.  The      patient had PCI to the mid circumflex with Cypher stent in August      2008.  The patient's most recent heart catheterization in the      setting of chest pain was in February 2009:  He had 90% mid LAD      lesion.  First diagonal was totally occluded.  He had a patent      stent in his mid circumflex.  He had an 80% distal circumflex      lesion.  There was a 90% mid RCA lesion.  The vein graft to the PDA      and the vein graft to diagonal were both patent.  The LIMA to the      LAD was patent and free RIMA to the obtuse marginal was patent.  EF      was 65% on ventriculogram.  The patient also had an echocardiogram      done in November 2009, which showed an EF greater than 55%.  RV was      normal in size and function.  There was mild diastolic dysfunction.  2. Hypertension.  3. Type 2 diabetes, which has been poorly controlled.  4. Hypercholesterolemia, which has been poorly controlled.  5. Poor medical compliance.  6. Possible TIA with presentation to [facility]      in November 2009.  The patient did have a normal MRI of the brain      and MRA of the head and neck.   Most recent labs were from [place] in November 2009, showed an LDL of  222, HDL of 38, triglycerides 207, hemoglobin A1c of 12.6, and  creatinine in March 2009 was 0.7.   MEDICATIONS:  1. Aspirin 325 mg daily.  2. Plavix 75 mg daily, however, the patient is out of his Plavix.  3. Lantus 80 units at bedtime.  4. Pravastatin 80 mg daily.  5. Lisinopril 5 mg daily.  6. Metoprolol 25 mg b.i.d.   EKG was reviewed, shows normal sinus rhythm  with evidence for early  repolarization, this is same as prior EKGs.   PHYSICAL EXAMINATION:  VITAL SIGNS:  Blood pressure is 124/84, heart  rate 78 and regular, and weight 268 pounds.  GENERAL:  This is a well-developed male in no apparent distress.  NEUROLOGICAL:  Alert and oriented x3.  Normal affect.  NECK:  There is no JVD.  There is no thyromegaly or thyroid nodule.  LUNGS:  Clear to auscultation bilaterally.  CARDIOVASCULAR:  Heart regular.  S1 and S2.  No S3.  No S4.  There is no  murmur.  There are 2+ posterior tibial pulses bilaterally.  There is no  peripheral edema.  There is no carotid bruit.  ABDOMEN:  Soft and nontender.  No hepatosplenomegaly.  Normal bowel  sounds.   ASSESSMENT AND PLAN:  This is a 30 year old with history of premature  coronary artery disease, status post coronary artery bypass grafting as  well as poorly-controlled diabetes, hypercholesterolemia, who presents  to Cardiology Clinic in followup after an admission at [facility] with possible TIA.   1. Coronary artery disease.  The patient does have severe coronary      artery disease, however, it does appear stable.  Most recent cath      showed his grafts were patent in February 2009.  He has had no      ischemic symptoms.  He developed chest pain while he was in the      hospital earlier this month.  However, it was felt this was      musculoskeletal as it was reproducible with palpation, was      constant, and was associated with no elevation in cardiac enzymes.      The patient will need to continue on his aspirin, his ACE      inhibitor, and his metoprolol.  We will plan on starting him back      on Plavix.  We are going  to try to obtain the Plavix through the      company for him.  He also needs to be on a stronger dose of statin.  2. Hypercholesterolemia.  The patient's LDL was 222 earlier this      month; however, he was not taking his statin at that time.  I am       going to stop the pravastatin and put him on Zocor 80 mg daily, as      Zocor is on the list that he can obtain for 4 dollars at [facility].  We      will check his lipids again in 2 months when he comes back for      followup.  3. Hypertension.  The patient's blood pressure is actually under good      control currently, continue his ACE inhibitor and his beta blocker.  4. Diabetes.  The patient's blood sugar is under very poor control.      His hemoglobin A1c was 12.6 earlier this month.  He states that his      fasting blood sugar is still running in the 180s now that he is      started taking his Lantus again.  He takes no other insulin.  I did      tell him that he is going to need to take a shorter-acting insulin      during the day.  He will continue his Lantus 80 units at night.  I      am going to put him on regular insulin, because I think this is      less expensive than NovoLog or Humalog.  He can do 8 units before      breakfast and 8 units before dinner.  He will check his sugar on      this regimen and we will check his hemoglobin A1c in 2 months, we      will titrate as needed.  The patient also has an appointment on      September 10, 2008, with the [facility] and they hopefully will      be able to help with his diabetes management.  Ideally, I would      probably send him to an endocrinologist to help with this.  I think      we may have to wait until he gets Medicaid as he is having      difficulty paying for all his medical care out of pocket.  5. Possible transient ischemic attack.  This was in November 2009.      His brain MRI and head and neck MRA were all normal.  He has not      had a TEE.  However, an echo done at that time showed no source for      embolus.  I do want him to start on Plavix as well as aspirin.  We      will try to get that through the company.  6. We are going to try to help the patient get set up with Medicaid as      he definitely needs some  help paying for his medical care.     Marca Ancona, MD  Electronically Signed    DM/MedQ  DD: 09/04/2008  DT: 09/04/2008  Job #: [id number]   cc:   [facility]

## 2011-02-23 NOTE — Consult Note (Signed)
[name], [name] NO.:  [id number]   MEDICAL RECORD NO.:  [id number]          PATIENT TYPE:  INP   LOCATION:  2312                         FACILITY:  [facility]   PHYSICIAN:  [name], M.D.     DATE OF BIRTH:  [birth year]   DATE OF CONSULTATION:  02/21/2007  DATE OF DISCHARGE:                                 CONSULTATION   CARDIOVASCULAR SURGICAL CONSULTATION:   REASON FOR CONSULTATION:  Severe 3-vessel coronary artery disease.   CLINICAL HISTORY:  I was asked to evaluate this 30 year old gentleman  for consideration of coronary artery bypass graft surgery by Dr. Tonny [name].  The patient has a history of diabetes, hypertension and  hyperlipidemia that have been poorly treated due to medical  noncompliance and also has a history of coronary artery disease, status  post stenting of his left circumflex at [facility] in December of 2007 after  presenting with myocardial infarction.  According to the patient, he was  on Plavix for about 3 months and then stopped it.  Patient reports that  there were some other blockages that were going to be treated medically.  He did well until Feb 20, 2007 when he developed substernal chest pain  while at the grocery store.  This waxed and waned and increased to 10/10  with radiation into his arms.  He came to the emergency room about 8:30  p.m.  Electrocardiogram showed no acute changes.  There was no  improvement with nitroglycerin, Lovenox and morphine.  Cardiac enzymes  were negative x2.  He was admitted with a diagnosis of possible unstable  angina.  He was transferred to [facility] for cardiac catheterization  and possible percutaneous intervention.  On May 13, he underwent cardiac  catheterization, which showed severe 3-vessel coronary artery disease.  The LAD had a long 75% proximal stenosis.  There was a diagonal branch  that had about 80% proximal stenosis and 75% mid vessel stenosis.  The  left circumflex had a single  large marginal branch.  The stent was  patent, but there was about 90% stenosis before this.  The right  coronary artery had 80% mid vessel stenosis and a 50% distal stenosis at  the takeoff of the posterior descending branch.  There was normal left  ventricular systolic function with an ejection fraction of 65%.  There  is no gradient across the aortic valve.   REVIEW OF SYSTEMS:  GENERAL:  He denies any fever or chills.  He has had  no recent weight changes.  He denies fatigue.  EYES:  Negative.  ENT:  Negative.  ENDOCRINE:  He has diabetes for the past 3 or 4 years, but  has not taken his medications due to difficulty affording them.  He  checks his blood sugars occasionally.  He denies hypothyroidism.  CARDIOVASCULAR:  As above.  He denies any PND or orthopnea.  He has had  no peripheral edema.  He denies palpitations.  RESPIRATORY:  He denies  cough and sputum production.  GI:  He has had no nausea or vomiting.  He  denies melena and bright red blood per rectum.  He did have some  diarrhea on the day prior to admission.  GU:  He denies dysuria and  hematuria.  NEUROLOGICAL:  He denies dizziness and syncope.  He has  never had a TIA or a stroke.  He has had no focal weakness.  He does  have some numbness in his feet.  MUSCULOSKELETAL:  He has had lower back  problems and has undergone epidural injections for that.  ALLERGIES:  None.  PSYCHIATRIC:  Negative.  Hematological:  Negative.   PAST MEDICAL HISTORY:  Significant for:  1. Diabetes for the past 4 years.  He was initially on oral medication      and switched to insulin and neither of which he has taken due to      financial difficulties.  2. He has a history of hypertension, 4 years.  3. History of hyperlipidemia.  4. He has had no prior surgeries.  5. He injured his back and has been out of work since.   SOCIAL HISTORY:  He is married and has 1 young son.  He stays at home  with his son and his wife works.  He quit smoking  in November of 2007.  Denies alcohol abuse.   FAMILY HISTORY:  There is history of coronary disease in his  grandfather.   MEDICATIONS:  He was not taking any prior to admission.  He was suppose  to be on aspirin, lisinopril and insulin.   PHYSICAL EXAMINATION:  VITAL SIGNS:  Blood pressure was 145/75 and pulse  was 80 and regular.  Respiratory rate was 16 and unlabored.  GENERAL:  He is a large framed, well-developed, muscular white male in  no distress.  HEENT EXAM:  Shows him to be normocephalic, atraumatic.  Pupils are  equal and reactive to light and accommodation.  Extraocular muscles are  intact.  His throat is clear.  NECK EXAM:  Shows normal carotid pulses bilaterally.  There are no  bruits.  There is no adenopathy or thyromegaly.  CARDIAC EXAM:  Shows a regular rate and rhythm with normal S1 and S2.  There is no murmur, rub or gallop.  LUNGS:  Clear.  ABDOMINAL EXAM:  Shows active bowel sounds.  His abdomen is soft and  nontender.  There are no palpable masses or organomegaly.  EXTREMITY EXAM:  Showed no peripheral edema.  Pedal pulses are palpable  bilaterally.  His radial pulses are palpable bilaterally.  He is left  handed.  NEUROLOGIC EXAM:  Shows him to be alert and oriented x3.  Motor and  sensory exam is grossly normal.  SKIN:  Warm and dry.   IMPRESSION:  [name] has severe 3-vessel coronary artery disease with  new onset anginal symptoms after left circumflex stenting in December of  2007.  I agree that coronary artery bypass graft surgery is the best  treatment for this 30 year old diabetic patient with 3-vessel disease  and multiple lesions.  I discussed the operative procedure with the  patient and his wife, including alternatives, benefits and risks,  including but not limited to bleeding, blood transfusion, infection,  stroke, myocardial infarction, graft failure and death.  I also discussed use of a right radial artery graft since his upper  extremity  arterial Doppler exam is normal.  I discussed the possibility that he  may have some numbness or paresthesias in his arm and hand related to  that.  He understands and is in agreement  with proceeding using the  radial artery graft.  We will tentatively plan to do surgery tomorrow  morning.      [name], M.D.  Electronically Signed     BB/MEDQ  D:  02/22/2007  T:  02/22/2007  Job:  [id number]

## 2011-02-23 NOTE — H&P (Signed)
[name], [name] NO.:  [id number]   MEDICAL RECORD NO.:  [id number]          PATIENT TYPE:  INP   LOCATION:  2907                         FACILITY:  [facility]   PHYSICIAN:  Luis Abed, MD, FACCDATE OF BIRTH:  [birth year]   DATE OF ADMISSION:  02/21/2008  DATE OF DISCHARGE:                              HISTORY & PHYSICAL   PRIMARY CARDIOLOGIST:  [name] Fus C. Daleen Squibb, MD, [facility]   PRIMARY CARE PHYSICIAN:  Dr. Dan Humphreys at [facility] in [place].   HISTORY OF PRESENT ILLNESS:  [name] is a 30 year old Caucasian  gentleman with a history of premature coronary artery disease status  post CABG in May 2008, and subsequent PCI in September 2008, who  presents today with complaint of chest pain similar to the chest  discomfort he had prior to his bypass.  Note, [name] was also cath'd  in February of this year, 2009, for episode of chest discomfort with an  abnormal EKG; however, on further clarification he has a chronically  abnormal EKG with ST elevations in inferolateral leads.  Cardiac  catheterization was done at that time that showed no changes, had patent  grafts and patent stents with a normal EF.  However, [name] states  that the chest discomfort he had in February was different from the  chest discomfort he had prior to his bypass.  Prior to his bypass, he  described it as a tightness and pressure, in February he described it as  a sharp and stabbing discomfort.  Today, around 3 o'clock, he presented  to [facility] complaining of chest discomfort that  started around 8 o'clock this morning.  He described it as a tightness  and pressure.  It was intermittent until lunch time, when it became  rather constant wax to wane and he decided to get evaluated.  He did not  take any nitroglycerin because he was already nauseated.  He initially  rated his chest pain 8 on a scale of 1-10.  A 12-lead EKG at [facility]  showed sinus tach at a  rate of 106 with persistent inferolateral ST  elevations, no change from EKG done in December 2008.  Labs were stable  with a cardiac enzymes negative x1 set.  His blood pressure was 131/69  per ER documentation from [place].  The patient was given Lopressor 5  mg IV x2 doses, aspirin, heparin 7500 bolus, and a drip was initiated,  nitroglycerin drip was also initiated.  The patient was transported on  April 08, 2006, via [facility].  [facility] staff state the patient slept the  whole way from [facility], did not complain of any discomfort  until he arrived at 2907 and states his pain was a 6.  Currently, the  patient is still rating his chest pain as 6.  A stat 12-lead EKG has  been done that shows no change from prior EKG done at [facility].   PAST MEDICAL HISTORY:  1. Coronary artery disease status post CABG in May 2008, with LIMA to      the LAD.  2. Saphenous vein graft to the diagonal, right internal mammary artery      to the SVG hood to obtuse marginal and SVG to the RCA.  3. Cardiac catheterization in August 2008, the patient had a Cypher      drug-eluting stent placed to the circumflex.  4. Cardiac catheterization in February 2009, patent grafts and stents.  5. Diabetes, poorly controlled.  The patient states his CBGs are      running in the 300s at home.  6. Hypertension.  7. Dyslipidemia.  8. Chronic abnormal EKG.  9. History of medical noncompliance.  10.Obesity.   SOCIAL HISTORY:  The patient lives in [place] with his wife and  child.  He is disabled secondary to his cardiac history.  He states he  quit using tobacco in 2006.  He walks infrequently for exercise.  Denies  any EtOH, drug, or herbal medication use.  Diet compliance, he states  fair in regards to heart healthy ADA diet.   FAMILY HISTORY:  Mother alive and well.  Father deceased secondary to  complications of COPD.  Siblings alive and well without known coronary  artery disease.   REVIEW  OF SYSTEMS:  Positive for chest pain, dyspnea on exertion,  occasional wheezing, numbness in hands and feet, and nausea today.  All  other systems reviewed and negative.   ALLERGIES:  No known drug allergies.   MEDICATIONS:  1. Metformin XR 500 mg, the patient takes 2 tablets in the morning,      and 2 in the evening.  2. Aspirin 325.  3. Metoprolol 25 t.i.d.  4. Zocor 80.  5. Plavix 75.  6. Lisinopril 20, the patient states he takes Byetta insulin at 1000      units nightly.   PHYSICAL EXAMINATION:  VITAL SIGNS:  The patient is afebrile.  Heart  rate currently 86, respirations 20, blood pressure 111/59, and sat 97%  on 2 L.  GENERAL:  He is in no acute distress.  He is currently rating his pain  as 6.  He is somewhat groggy.  He keeps his eyes closed while conversing  with staff.  HEENT:  Unremarkable.  NECK:  Supple without lymphadenopathy, bruits, or JVD.  CARDIOVASCULAR:  S1 and S2, regular rate and rhythm.  Do not hear any  murmur, rubs, or gallops.  LUNGS:  Clear to auscultation bilaterally with poor inspiratory effort.  SKIN:  Warm and dry.  ABDOMEN:  Soft and nontender.  Positive bowel sounds.  LOWER EXTREMITIES:  Without clubbing, cyanosis, or edema.  He has  positive pedal pulses bilaterally.  NEUROLOGIC:  Oriented x3.  Cranial nerves II through XII grossly intact.   LABORATORY DATA:  Chest x-ray done at [facility],  interpretation pending at this time.  EKG here showing sinus rhythm with  persistent inferolateral ST elevation.  No change from previous EKGs.  Note, this lab work obtained from [facility]  including CBC, cardiac enzymes x1 set, and chemistry.  WBC 7.3,  hemoglobin 16.1, hematocrit 46.9, and platelets 287,000.  Sodium 132,  potassium 4.4, chloride 97, CO2 is 26, BUN 12, creatinine 0.9, and  glucose 418.  First set of cardiac markers negative x1.   IMPRESSION:  1. Unstable angina.  The patient continues to  complain of discomfort      with no new changes in EKG and first set of cardiac enzymes being      negative.  The patient being admitted to CCU.  We will continue      heparin and nitroglycerin.  Titrate nitroglycerin as needed,      morphine for further vasodilatory effect, oxygen.  2. History of coronary artery disease.  3. Diabetes, poorly controlled.  4. Dyslipidemia, check lipids.  5. Hypertension, stable at this time.   PLAN:  The patient to be monitored for further changes in cardiac  status.  Dr. Daleen [name] to round on patient in the morning as he is less  familiar with the patient to make further decisions.  Dr. Willa Rough  in to examine and assess.  The patient agrees with plan of care.      Dorian Pod, ACNP      Luis Abed, MD, [facility]  Electronically Signed    MB/MEDQ  D:  02/21/2008  T:  02/22/2008  Job:  [id number]   cc:   Dr. Dan Humphreys

## 2011-02-23 NOTE — Discharge Summary (Signed)
[name], [name] NO.:  [id number]   MEDICAL RECORD NO.:  [id number]          PATIENT TYPE:  INP   LOCATION:  6522                         FACILITY:  [facility]   PHYSICIAN:  Maisie Fus C. Wall, MD, FACCDATE OF BIRTH:  [birth year]   DATE OF ADMISSION:  12/05/2007  DATE OF DISCHARGE:  12/06/2007                               DISCHARGE SUMMARY   PRIMARY CARDIOLOGIST:  Maisie Fus C. Wall, MD, [facility]   PRIMARY CARE PHYSICIAN:  Dr. Dan Humphreys through the [facility].   PROCEDURES PERFORMED DURING HOSPITALIZATION:  1. Cardiac catheterization completed by Dr. Tonny Bollman dated      December 05, 2007.      a.     Three-vessel native CAD, patent SVG to PDA, patent SVG to       diagonal one, patent free RIMA to OM with ostial narrowing, patent       LIMA to LAD, patent circumflex stent, normal LV function of 65%,       treat medically.   FINAL DISCHARGE DIAGNOSES:  1. Coronary artery disease.      a.     Status post coronary artery bypass grafting May 2008 with       LIMA to the LAD, SVG to diagonal, free RIMA from the SVG hood to       the obtuse marginal and SVG to PDA.      b.     Status post PCI September 2008 with Cypher 2.5 x18 mm drug-       eluting stent.  2. Hypertension.  3. Hyperlipidemia.  4. Diabetes.  5. History of medical noncompliance.   HOSPITAL COURSE:  This is a 30 year old Caucasian male with known  history of significant premature coronary artery disease who had  coronary artery bypass grafting in May of 2008 and PCI in September 2008  who presented to the emergency room with acute onset of chest pain.  The  patient stated that it started around 10:00 p.m. the evening of  admission and he developed severe pressure radiating to his left arm,  jaw and back.  The patient stated that the pain and symptoms were  similar to the pain he experienced prior to his presentation in May of  2008, when he was diagnosed with coronary artery disease.   The patient  stated throughout the day he was in his usual state of  health and then began to experience chest discomfort with posterior jaw  pain and ear pain that evening.  He denied any precipitating or  alleviating factors.  The patient was seen in the emergency room by Dr.  Audery Amel, cardiology fellow, for Dr. Juanito Doom.  He was admitted  to rule out myocardial infarction.  He was placed in an ICCU bed for  further evaluation and monitoring, cycling cardiac enzymes.  He was  started on Lovenox along with continuation of aspirin and Plavix, and he  had followup labs the following day to include hemoglobin A1c and  cholesterol studies.   The patient's cholesterol study revealed elevated cholesterol at 254,  triglycerides 247, HDL  29 and LDL of 176.  The patient's troponins were  found to be negative x 2 at 0.05 and 0.05.  On admission, the patient  was found to be hyperglycemic with a blood sugar of 249.  The patient  was monitored in the ICU and planned for cardiac catheterization the  following morning.  On December 05, 2007, the patient did undergo  cardiac catheterization with results described above.  Please see Dr.  Casimiro Needle Cooper's thorough cardiac catheterization dictation for more  details.   The patient recovered well without further complaints of chest  discomfort.  He was seen by diabetic educator and his Lantus insulin was  increased to 100 units q.p.m.  The patient has a history of medical  noncompliance and had lengthy discussions with Dr. Daleen Squibb prior to  discharge on the need for medical compliance, especially with his  history and diabetes to maintain normal blood sugar.  The patient is  getting significant help with his medications and has no reason not to  be taking medications as prescribed.   DISCHARGE LABORATORIES:  Sodium 140, potassium 3.4, chloride 110, CO2  25, glucose 162, BUN 6, creatinine 0.61, hemoglobin 13.6, hematocrit  40.1, white blood cell 7.0, platelets  254.  Hemoglobin A1c was 11.8.  Cardiac markers were found to be negative x 3 with a troponin of less  than 0.01 and 0.01 respectively.  EKG on discharge:  Normal sinus rhythm  without evidence of ischemia.   DISCHARGE MEDICATIONS:  1. Aspirin 325 mg daily.  2. Plavix 75 mg daily.  3. Lisinopril 20 mg daily.  4. Lipitor 80 mg daily.  5. Lantus insulin 100 unit injection at bedtime.  6. Isosorbide mononitrate 30 mg daily.  7. Metoprolol 25 mg twice daily.  8. Nitroglycerin 0.4 mg under tongue as needed for chest pain.  9. Percocet 5/325 q.6h. p.r.n. pain.   ALLERGIES:  No known drug allergies.   FOLLOWUP PLANS AND APPOINTMENTS:  1. The patient is to follow up with primary care physician, Dr. Dan Humphreys      at the [facility], for continued evaluation of diabetes and      management.  2. The patient is to follow up with Dr. Juanito Doom on March 4 at 10:00      a.m. on a previously scheduled appointment.  3. The patient has been given post cardiac catheterization      instructions with particular emphasis on the right groin site for      evidence of bleeding, hematoma, and infection.  4. The patient has had a thorough discussion with Dr. Juanito Doom      concerning medical compliance and continuation of diabetes      management.   Time spent with the patient to include physician time 40 minutes.     Bettey Mare. Lyman Bishop, NP      Jesse Sans. Daleen Squibb, MD, [facility]  Electronically Signed   KML/MEDQ  D:  12/06/2007  T:  12/06/2007  Job:  [id number]   cc:   Dr. Dan Humphreys

## 2011-02-23 NOTE — Cardiovascular Report (Signed)
NAMERITHY, MANDLEY NO.:  [id number]   MEDICAL RECORD NO.:  [id number]          PATIENT TYPE:  OIB   LOCATION:  NA                           FACILITY:  [facility]   PHYSICIAN:  Marca Ancona, MD      DATE OF BIRTH:  1981-03-22   DATE OF PROCEDURE:  DATE OF DISCHARGE:                            CARDIAC CATHETERIZATION   PROCEDURES:  1. Left heart catheterization.  2. Left ventriculography.  3. Coronary angiography.  4. Saphenous vein graft angiography.  5. Left internal mammary artery angiography.   INDICATIONS:  This is a 30 year old with a history of coronary artery  bypass grafting who has had ongoing chest pain for a number of weeks  now.  He had a stress test showing probable inferior attenuation,  otherwise no significant defect.  He has been, however, having  continuing chest pain and he has presented to the emergency department.  Given ongoing chest pain in a patient with known CAD, LHC was done.   PROCEDURE NOTE:  After informed consent was obtained, the right groin  was sterilely prepped and draped.  Lidocaine 1% was used to locally  anesthetize the right coronary area.  A 5-French arterial sheath was  placed using Seldinger technique.  The right coronary artery, the left  coronary artery, and the saphenous vein grafts were engaged using the 5-  [place] multipurpose catheter.  The left ventricle was entered using the  5-French multipurpose catheter.  The LIMA was engaged using a 5-French  LIMA catheter.  There were no complications.   FINDINGS:  1. Hemodynamics:  LV 115/14, aorta 123/78.  2. Left ventriculography:  EF was estimated to be 55-60%.  There were      no wall motion abnormalities.  3. Right coronary artery:  The right coronary artery was dominant.      There was a 90% mid RCA stenosis.  There was a patent saphenous      vein graft to the PDA and this vein graft had a very mildly      aneurysmal segment proximally; however, there was no  flow-limiting      stenosis.  4. Left main:  The left main coronary artery was free from significant      disease.  5. Left circumflex system.  There was a patent stent in the proximal      to mid left circumflex.  There was a large first obtuse marginal      with 40% ostial stenosis.  There was 50% stenosis in a very small      distal AV circumflex.  There was a patent free RIMA to the first      obtuse marginal.  The first obtuse marginal proximally did have      competitive flow.  6. LAD system:  There is a 40% proximal LAD stenosis and a 90% mid LAD      stenosis.  There was a totally occluded diagonal that was probably      the first diagonal that was supplied by a patent saphenous vein      graft.  7. The LIMA to the LAD is patent.  There was actually competitive flow      in the LAD proximal to the LIMA touchdown.  The distal LAD had      about a 40% stenosis.  8. Of note, the free RIMA graft was anastomosed proximally in end-to-      side fashion to the vein graft to the diagonal.   ASSESSMENT AND PLAN:  This is a 30 year old with a history of atypical  chest pain and coronary artery bypass grafting.  All his grafts are  patent.  There is no lesion noted to explain his chest pain.  He has  normal left ventricular systolic function.  I think he is probably  having noncardiac chest pain.      Marca Ancona, MD  Electronically Signed     DM/MEDQ  D:  04/01/2009  T:  04/02/2009  Job:  [id number]

## 2011-02-23 NOTE — Assessment & Plan Note (Signed)
[facility]                            [place] OFFICE NOTE   NAME:[name], [name] COEY                        MRN:          [id number]  DATE:12/20/2007                            DOB:          November 20, 1980    [name] returns today after being discharged from the hospital on  December 06, 2007, with chest pain.   His cath showed a patent LIMA to LAD, vein graft to diagonal free RIMA  to the vein graft hood and obtuse marginal and saphenous vein graft was  patent to the PDA.  He had had a previous Cypher drug-eluting stent in  September 2008.  This was in the circumflex system.  He has normal left  ventricular function.   He has been extremely noncompliant with his meds.  This has objectively  reflected in his hemoglobin A1c of 11.8.   He ruled out for myocardial infarction.   One problem is that his EKG always shows some ST-segment elevation in  multiple leads consistent mostly with early repolarization.  One time  this resulted an emergency transfer to [facility] for cardiac cath.   I am going to make a copy of his EKG today for him to carry with him  hopefully to avoid this.   CURRENT MEDICATIONS:  1. Aspirin 325 mg a day.  2. Zocor 80 mg nightly.  3. Once a day simvastatin 80 mg nightly.  4. Lisinopril 20 mg a day.  5. Plavix 75 mg a day.  6. Lantus as directed by Dr. Dan Humphreys.   Blood sugars have been running around 150 at home which is vastly  improved from before.  He has a followup scheduled with Dr. Dan Humphreys for  further management of his diabetes.   His blood pressure today is 110/78, his pulse is 68 and regular.  His  weight is 274, stable.  HEENT:  Unchanged.  Carotids upstrokes were equal bilaterally without  bruits, no JVD.  Thyroid is not enlarged.  Trachea is midline.  LUNGS:  Clear.  HEART:  Reveals a poorly appreciated PMI; normal S1-S2.  ABDOMEN: Abdominal exam is obese with good bowel sounds.  His right cath site is stable.  There  is no bruit.  Distal pulses are intact.  There is no edema.  NEURO:  Exam is intact.   I had a 20-minute talk again with [name] about compliance and the  importance of avoiding future vascular events.  I have also made him a  copy of his EKG to show to any physician unfamiliar with his history.  Hopefully  this will avoid unnecessary treatment or reactions in the  future.  I have reassured him that the chest pain that he had coming  into [facility] last week was not cardiac.  We reviewed the symptoms  of angina, both exertional and unstable.  He needs followup lipids which  I will arrange as well as a comprehensive metabolic panel since his  potassium was 3.4 on admission to [facility].  I will see him back in 3  months.     Thomas C. Wall,  MD, [facility]  Electronically Signed    TCW/MedQ  DD: 12/20/2007  DT: 12/21/2007  Job #: [id number]   cc:   Yates Decamp

## 2011-02-23 NOTE — Cardiovascular Report (Signed)
NAMEKOLSTON, LACOUNT NO.:  [id number]   MEDICAL RECORD NO.:  [id number]          PATIENT TYPE:  INP   LOCATION:  6522                         FACILITY:  [facility]   PHYSICIAN:  Veverly Fells. Excell Seltzer, MD  DATE OF BIRTH:  06/24/1981   DATE OF PROCEDURE:  12/05/2007  DATE OF DISCHARGE:  12/06/2007                            CARDIAC CATHETERIZATION   PROCEDURE:  1. Left heart catheterization.  2. Selective coronary angiography.  3. Left ventricular angiography.  4. Saphenous vein graft angiography.  5. Left internal mammary artery angiography.   INDICATIONS:  [name] is a 30 year old gentleman with multivessel  CAD.  He underwent CABG last summer.  He ultimately underwent stenting  of the left circumflex to treat severe in-stent restenosis after his  CABG when he presented with persistent chest pain and had an ischemic  perfusion defect on a nuclear study; this was done in September 2008.  He has done well in the interim, but presents with recurrent chest pain.  He was referred for cardiac cath.   Risks and indications of the procedure were reviewed with the patient  and informed consent was obtained.  The right groin was prepped, draped  and anesthetized with 1% lidocaine.  Using a modified Seldinger  technique, a 6-French sheath was placed in the right femoral artery.  Standard 6-French Judkins catheters were used for coronary angiography.  The JR-4 catheter was used for LIMA angiography as well as saphenous  vein graft angiography.  An angled pigtail catheter was used for left  ventriculography.  The patient tolerated the procedure well and had no  immediate complications.   FINDINGS:  Aortic pressure 109/11.  Left ventricular pressure 109/11,  aortic pressure 97/64 with a mean of 78.  There was no true aortic valve  gradient.  There was whip artifact falsely elevating the left  ventricular pressure, which by my estimation was 98-100 mmHg.   CORONARY  ANGIOGRAPHY:  Left mainstem has minor plaque.  It bifurcates  into the LAD and left circumflex.  There is no significant stenosis.   The LAD is a large-caliber vessel that courses down to the LV apex.  The  proximal LAD has minor plaque.  The mid LAD has a 90% eccentric  stenosis.  The mid and distal LAD beyond the area of the eccentric  stenosis fill competitively from native and graft flow.  The first  diagonal branch is occluded.   Left circumflex:  The left circumflex is widely patent.  It supplies a  large OM branch that bifurcates into twin vessels.  The mid circumflex  has a widely patent stent with no significant stenosis.  The AV groove  circumflex arising from the midportion of the stented segment has severe  ostial narrowing, but it is a very small vessel.  The stenosis is in the  range of 80%.  There is TIMI-3 flow present.   Right coronary artery:  The right coronary artery is dominant.  There is  a severe 90% focal stenosis in the midportion of the midportion of the  RCA.  There is no  other significant angiographic stenosis noted.  There  is a PDA branch that is the target of the saphenous vein graft and 2  posterolateral branches.   SAPHENOUS VEIN GRAFT ANGIOGRAPHY:  The saphenous vein graft to the right  PDA is widely patent.  The graft anastomosis is normal.  There is minor  narrowing in the proximal portion of the graft, but no significant  stenosis.   Saphenous vein graft to first diagonal is widely patent.  There is no  significant stenosis.  The first diagonal has no significant  angiographic stenosis following the graft insertion site.   Free RIMA to OM branch is patent.  There is ostial narrowing with  probably a component of vasospasm.  The free RIMA is hooded to the  saphenous vein graft origin.  There is normal flow in the free RIMA  graft.   LIMA to LAD is widely patent.  There is competitive flow, as noted  above.  The mid and distal LAD or difficult  to visualize beyond the  graft insertion because of competitive flow.   Left ventricular function is normal.  The LVEF is 65%.   ASSESSMENT:  1. Native three-vessel coronary artery disease.  2. Patent saphenous vein graft to posterior descending artery.  3. Patent saphenous vein graft to first diagonal.  4. Patent free right internal mammary artery to obtuse marginal with      ostial narrowing, probable vasospasm.  5. Patent left internal mammary artery to left anterior descending.  6. Patent left circumflex stent.  7. Normal left ventricular function.   DISCUSSION:  Mr. Apodaca has widely patent grafts and a widely patent  stent in the mid circumflex.  He has had excellent revascularization and  I suspect his chest pain is noncardiac in nature.  We will continue  medical therapy for his CAD.      Veverly Fells. Excell Seltzer, MD  Electronically Signed     MDC/MEDQ  D:  12/05/2007  T:  12/06/2007  Job:  [id number]   cc:   Thomas C. Wall, MD, [facility]

## 2011-02-23 NOTE — Discharge Summary (Signed)
NAMEARTEMIO, [name] NO.:  [id number]   MEDICAL RECORD NO.:  [id number]          PATIENT TYPE:  INP   LOCATION:  2031                         FACILITY:  [facility]   PHYSICIAN:  Evelene Croon, M.D.     DATE OF BIRTH:  1981-07-14   DATE OF ADMISSION:  02/21/2007  DATE OF DISCHARGE:                               DISCHARGE SUMMARY   PRIMARY DIAGNOSIS:  Severe three-vessel coronary artery disease.   IN-HOSPITAL DIAGNOSES:  1. Acute blood loss anemia postoperatively.  2. Volume overload postoperatively.   SECONDARY DIAGNOSES:  1. Diabetes mellitus which the patient is supposed to be on insulin,      but because of financial difficulties, the patient has been unable      to take it.  2. Hypertension.  3. Hyperlipidemia.  4. History of back injury, for which the patient has been out of work      since.   INPATIENT OPERATIONS AND PROCEDURES:  1. Cardiac catheterization.  2. Coronary artery bypass grafting x4 using a left internal mammary      artery to left anterior descending, right radial artery to obtuse      marginal branch of the left circumflex coronary artery, saphenous      vein graft to diagonal branch, saphenous vein graft to posterior      descending branch.  Endoscopic vein harvest from left leg.  Open      right radial artery harvest.   HISTORY AND PHYSICAL AND HOSPITAL COURSE:  The patient is 30 year old  gentleman with hypertension, hyperlipidemia, diabetes mellitus that has  been poorly controlled due to medical noncompliance.  The patient  underwent stenting of the large obtuse marginal branch, this was after a  myocardial infarction in December 2007 at [facility].  The patient has continued on Plavix x3 months and has since  discontinued it.  He was admitted and on May 11 to [facility] with substernal chest pain.  The patient was transferred  to [facility] and underwent cardiac catheterization which  showed severe  three-vessel coronary artery disease.  The stent and obtuse marginal was  patent but there was about 90% stenosis proximal to this.  The LAD and  diagonal branch both had high-grade stenosis.  The right coronary artery  was diffusely diseased.  The left ventricular function was well-  preserved.  After catheterization, Dr. Laneta Simmers was consulted.  Dr. Laneta Simmers  saw and evaluated the patient.  He discussed the patient undergoing  coronary artery bypass grafting.  Risks and benefits were discussed.  The patient acknowledged understanding and agreed to proceed.  Surgery  is scheduled for Feb 22, 2007.  Following catheterization, the patient  remained stable.  For details of the patient's past medical history and  physical exam, please see dictated H&P.  The patient underwent bilateral  carotid duplex ultrasound, showing no significant ICA stenosis.   The patient was taken to the operating room Feb 22, 1999, where he  underwent coronary bypass grafting x4 using a left internal mammary  artery graft to the  left anterior descending coronary artery, right  radial artery to obtuse marginal, saphenous vein graft to diagonal  branch, saphenous vein graft to posterior descending branch.  The  patient tolerated this procedure and was transferred to the intensive  care unit in stable condition.  Postoperatively, the patient noted to be  hemodynamically stable.  The patient was extubated the evening of  surgery.  Following extubation, the patient was noted to be alert and  oriented x4.  Neuro intact.  The patient's postoperative course was  pretty much remarkable.  Post-op day #1, vitals were stable.  Chest x-  ray was stable.  The patient was able to have chest tubes and lines  discontinued.  Was transferred out to 2000.  Postoperatively, the  patient did develop volume overload, required several doses of  diuretics.  He was back near baseline, prior to discharge home.  The  patient also  developed an acute blood loss anemia.  This was followed  closely remained stable.  The patient was asymptomatic.  During the  patient's postoperative course, vital signs were followed closely.  The  patient was noted to remain afebrile.  He is able to be weaned off  oxygen, satting greater than 90% on room air.  The patient remained in  normal sinus rhythm postoperatively.  Pulmonary status remained stable.  Incisions were clean, dry and intact and healing well.  The patient was  out of bed ambulating well.  He was tolerating diet well.   Labs post-op day #2 showed a white count of 8.9, hemoglobin 10,  hematocrit 32, platelet count 163.  Sodium of 138, potassium of 3.6,  chloride of 104, bicarb of 22, BUN of 4, creatinine 0.7, glucose of 160.  The patient's blood sugars were all followed closely.  Due to the  patient's history of diabetes mellitus and noncompliance, he was  restarted on Lantus insulin.  The patient's blood sugars were slightly  stable and continued to be monitored, prior to discharge.   The patient is tentatively ready for discharge home in the next 1 to 2  days.   FOLLOW-UP APPOINTMENTS:  A follow-up appointment will be arranged with  Dr. Laneta Simmers for in 3 weeks.  Our office will contact the patient with  this information.  The patient will need to follow up with Dr. Daleen Squibb in 2  weeks.  She will need to contact Dr. Vern Claude office to arrange this  appointment.   ACTIVITY:  Patient instructed on no driving, until released to do so, no  heavy lifting over 10 pounds.  The patient was told to ambulate 3 to 4  times per today, activities as tolerated, continue with breathing  exercises.   Incisional care.  The patient was told to shower, washing his incisions  using soap and water.  He is to contact the office, if he develops any  drainage or opening from any of his incision sites.  DIET:  The patient again on diet to be low-fat, low-salt, as well as   carbohydrate-modified medium calorie diet.   DISCHARGE MEDICATIONS:  1. Aspirin 325 mg daily.  2. Lopressor 25 mg b.i.d..  3. Zocor 80 mg at night.  4. Lantus 40 units subcutaneously at night.  5. Imdur 30 mg daily.  6. Oxycodone 5 mg 1-2 tablets q.4-6 h p.r.n. pain.      Theda Belfast, [place]      Evelene Croon, M.D.  Electronically Signed    KMD/MEDQ  D:  02/24/2007  T:  02/24/2007  Job:  [id number]   cc:   Thomas C. Daleen Squibb, MD, [facility]  Evelene Croon, M.D.

## 2011-02-23 NOTE — Assessment & Plan Note (Signed)
[facility]                            [place] OFFICE NOTE   NAME:[name], [name]                        MRN:          [id number]  DATE:09/29/2007                            DOB:          [birth year]    [name] comes in today as an add on because of followup from an  emergency catheterization done at [facility] on September 26, 2007.  He reported to the emergency room at [facility] at 1955  hours.  His initial EKG showed no acute changes.  Cardiac markers were  negative.  His other blood work was unremarkable.   He requested to come to [facility] and told the emergency department  that he was our patient, specifically my patient.  He was told that he  had to go to [facility].   He had an emergency catheterization, which I do not have the report of.  His wife and he concur that his arteries were wide open.   Since discharge, he has done well.   CURRENT MEDICATIONS:  1. Aspirin 325 mg a day.  2. Zocor 80 mg p.o. nightly.  3. Lantus 80 units nightly.  4. Lisinopril 20 mg a day.  5. Plavix 75 mg a day.   Fortunately, he has obtained Medicaid and Disability and he is able to  get his medications now.  He has seen Dr. Bartholomew Crews at the [facility] for his diabetes.  I have encouraged him to go back since he is  still having problems with control, perhaps to have some outpatient  teaching.   PHYSICAL EXAMINATION:  VITAL SIGNS:  Blood pressure 126/80, pulse 84 and  regular, weight 275.  HEENT:  Unchanged.  NECK:  Carotids are full.  No JVD.  No bruits.  LUNGS:  Clear.  HEART:  Regular rate and rhythm.  PMI is poorly appreciated.  Catheterization site stable.  EXTREMITIES:  No edema.  Pulses are intact.   IMPRESSION:  I am delighted that [name] received a good report from  his procedure at [facility].  Unfortunately, he did not come to [facility] as  he requested and was unable to have continuity of care with his  cardiology team.   I told him that I would look into this from a professional standpoint.  I am sure this is an explanation.   We will plan on seeing him back here in 3 months.     Thomas C. Daleen Squibb, MD, [facility]  Electronically Signed    TCW/MedQ  DD: 09/29/2007  DT: 09/30/2007  Job #: [id number]   cc:   Ronette Deter, M.D.  Bartholomew Crews, M.D.

## 2011-02-23 NOTE — Cardiovascular Report (Signed)
NAMERORY, XIANG NO.:  [id number]   MEDICAL RECORD NO.:  [id number]          PATIENT TYPE:  INP   LOCATION:  6527                         FACILITY:  [facility]   PHYSICIAN:  Veverly Fells. Excell Seltzer, MD  DATE OF BIRTH:  08-12-1981   DATE OF PROCEDURE:  06/22/2007  DATE OF DISCHARGE:                            CARDIAC CATHETERIZATION   PROCEDURE:  PTCA and stenting of the left circumflex, StarClose of the  right femoral artery.   INDICATIONS:  [name] is an unfortunate 30 year old young man who has  advanced coronary atherosclerosis.  He had previous stenting of his left  circumflex with what I believe is a bare-metal stent, which was done at  [facility].  He presented to our institution back in May with unstable angina.  I performed his cardiac catheterization and found severe three-vessel  CAD.  He was referred for coronary bypass which was performed by Dr.  Laneta Simmers.  He did multivessel bypass and Malin did relatively well for the  months following his procedure.  He has developed recurrent chest pain  and underwent a cardiac catheterization by Dr. Riley Kill last week.  The  catheterization demonstrated ostial stenosis of the radial artery graft  which is anastomosed to the OM branch of the left circumflex.  That  graft is hooded from a saphenous vein graft ostium.  The native left  circumflex has severe stenosis.  A stress Myoview was performed that  showed posterolateral ischemia matching the territory of the left  circumflex and Amro presented yesterday to the clinic with unstable  angina.  In that setting, we elected to intervene on his native left  circumflex.   Risks and indications of the procedure were explained in detail.  Informed consent was obtained.  The right groin was prepped, draped and  anesthetized with 1% lidocaine.  Using the modified Seldinger technique,  a 6-French sheath was placed in the right femoral artery.  A 6-French XB  3.0 guide catheter was  inserted.  Initial angiographic images  demonstrated high-grade stenosis of the proximal left circumflex with  moderate restenosis within the stented segment.  Angiomax was used for  anticoagulation.  Once a therapeutic ACT was achieved, a Cougar  guidewire was passed down the left circumflex into a distal OM branch.  A 2.5 x 12-mm balloon was used to predilate the lesion.  The balloon was  obstructive, confirming that the stenosis was quite severe.  The lesion  was predilated to 6 atmospheres on one inflation.  Following balloon  predilatation, there was excellent flow throughout the vessel.  There  was significant residual stenosis and I chose to stent the vessel with a  2.5 x 18-mm Cypher stent.  This this length was chosen to cover the area  of moderate restenosis within the bare-metal stent, as well as the de  novo lesion proximal to the stent.  The Cypher stent was carefully  positioned and deployed at 16 atmospheres.  The stent was well expanded.  I elected to post dilate it with a 2.75 x 15-mm Quantum Maverick which  was taken to 16  atmospheres on two inflations to cover the entire  stented segment.  Following post dilatation, there was excellent flow  throughout the vessel and a widely patent stent.  The patient was chest  pain free and tolerated the procedure well.  A StarClose device was used  to seal the femoral arteriotomy.  There were no immediate complications.   ASSESSMENT:  Successful percutaneous coronary intervention with a Cypher  drug-eluting stent in the mid left circumflex artery.   RECOMMENDATIONS:  Recommend continue dual antiplatelet therapy with  aspirin lifelong and clopidogrel for one year.  I have placed a consult  with the case manager to help with a clopidogrel assistance program.      Veverly Fells. Excell Seltzer, MD  Electronically Signed     MDC/MEDQ  D:  06/22/2007  T:  06/22/2007  Job:  [id number]   cc:   Thomas C. Wall, MD, [facility]

## 2011-02-23 NOTE — Assessment & Plan Note (Signed)
[facility]                            [place] OFFICE NOTE   NAME:[name], [name]                        MRN:          [id number]  DATE:11/07/2008                            DOB:          December 29, 1980    PRIMARY CARE PHYSICIAN:  The patient does not have primary care  physician.   HISTORY OF PRESENT ILLNESS:  This is a 30 year old with history of  premature coronary artery disease status post coronary artery bypass  grafting, poorly controlled diabetes, and poorly controlled  hypercholesterolemia who presents to Cardiology Clinic for followup of  his chronic problems.  Since I last saw the patient, he was seen by the  [facility] and apparently they have changed around his medications  considerably, then shortly thereafter the patient was told that his  income was too high to qualify for the [facility], so he is no  longer seeing them and can no longer get his medications through them  and he is actually out of most of his medications, except for aspirin  and insulin.  He ran out of his Plavix 2 days ago.  Otherwise, he  symptomatically has been doing well.  He has had no episodes of chest  pain or tightness.  He does not get short of breath with exertion.  He  is active.  He walks for exercise.  He has been doing some hunting.  He  has had no problems with a moderate degree of exertion.  His sugar  continues to be high, this morning it was 230.  He states he has been  feeling somewhat sick lately with respiratory symptoms and that his  sugars have been fluctuating higher than normal.  The patient is on  disability.  He has not set up his Medicaid.  He actually will not be  getting Medicaid until June 2010.  He does report that his mood is  somewhat depressed.   PAST MEDICAL HISTORY:  1. Coronary artery disease status post coronary artery bypass grafting      in May 2008.  There is an SVG to the PDA, SVG to the diagonal, LIMA      to  the LAD, and a free RIMA to an obtuse marginal.  The patient had      PCI to the mid circumflex with a Cypher stent in August 2008.  The      patient's most recent heart cath in the setting of chest pain in      February 2009, he had 90% mid LAD lesion.  The first diagonal was      totally occluded.  He had patent stent in his mid circumflex.  He      had an 80% to distal circumflex stenosis.  There was a 90% mid RCA      stenosis.  The vein graft to the PDA and the vein graft to the      diagonal, both looked patent.  The LIMA to the LAD was patent and      the free RIMA to the obtuse marginal was  patent.  EF was 65 on the      ventriculogram.  The patient also had an echo done in November      2009, which showed an EF greater than 55%.  RV was normal in size      and function.  There is a mild diastolic dysfunction.  2. Hypertension.  3. Type 2 diabetes, which has been poorly controlled.  4. Hypercholesterolemia, which has been poorly controlled.  5. Poor medical compliance,  mainly due to economic issues.  6. Possible TIA with presentation to [facility]      in November 2009.  The patient did have a normal MRI of the brain      and MRA of the head and neck.  Most recent labs in November 2009,      LDL was 222 off medications, HDL was 38, triglycerides 161,      hemoglobin A1c 12.60, and creatinine in March 2009 was 0.7.   MEDICATIONS:  The patient should be on are;  1. Aspirin 325 mg daily.  2. Plavix 75 mg daily.  3. Lantus 80 units at bedtime.  4. Regular insulin 8 units before breakfast and before dinner.  5. Zocor 80 mg daily.  6. Lisinopril 5 mg daily.  7. Metoprolol 25 mg b.i.d.   Medications he is actually taking now are his insulin, aspirin 325 mg a  day, and some Crestor that he got from the [facility].  He does  not know what dose he is on.   PHYSICAL EXAMINATION:  VITAL SIGNS:  Blood pressure is 120/70 and heart  rate is 85 and regular.   GENERAL:  This is a well-developed male in no apparent distress.  NEUROLOGIC:  Alert and oriented x3.  Normal affect.  NECK:  There is no JVD.  There is no thyromegaly or thyroid nodule.  LUNGS:  Clear to auscultation bilaterally.  CARDIOVASCULAR:  Heart regular S1 and S2.  No S3.  No S4.  No murmur.  There are 2+ posterior tibial pulses bilaterally.  There is no  peripheral edema.  There is no carotid bruits.  ABDOMEN:  Soft and nontender.  No hepatosplenomegaly.  Normal bowel  sounds.   ASSESSMENT AND PLAN:  This is a 30 year old with a history of premature  coronary artery disease status post coronary artery bypass grafting, as  well as poorly controlled diabetes and hypercholesterolemia, who  presents to Cardiology Clinic for followup.  1. Coronary artery disease.  The patient does have severe coronary      artery disease; however, it does appear stable.  Most recent      catheterization in February 2009 showed patent grafts.  He has had      no ischemic symptoms.  He will continue on his aspirin and his      Plavix.  We will provide him with Plavix here in from the office      today and he is set up for the program through the company and      should be receiving free Plavix in the mail that has been arranged.      I am going to start him back on his ACE inhibitor, which is a      lisinopril 5 mg daily and his metoprolol 25 mg twice a day.  2. Hyperlipidemia.  The patient's LDL was 222 back in November;      however, he was off of statin. The last time when I  saw him, I      changed him from pravastatin to Zocor 80 mg a day, because he can      get that for 4 dollars at [facility].  He was put on Crestor by the      [facility]; however, the [facility] will no longer see      him and he cannot afford Crestor, so we will take him back off of      the Crestor and put him back on the Zocor at 80 mg a day.  We are      going to bring him back fasting to check his lipids,  complete      metabolic panel, LFTs, and hemoglobin A1c.  3. Hypertension.  Blood pressure is under good control.  He will      restart his ACE inhibitor and beta-blocker.  4. Diabetes.  Blood sugar is still running high.  It is up above 200      at times.  We will have him increase his regular insulin in the      morning and in the evening to 14 units.  He will continue on his      Lantus 80 units at night.  He is able to get his insulin through      [facility] apparently for free.  I do want him to see an      endocrinologist to help with his diabetes management; however,      until he gets Medicaid, he is not able to afford either primary      care or endocrine care at this point.  5. Depression.  The patient tells me he does have a depressed mood.      His wife states he has had panic attacks in the past.  He and his      wife told me about a psychiatry clinic he can go to at reduced      price with a referral.  We will give him a referral for that.  6. We will see the patient back in 3 months to assess his diabetes      control.  We will also call him in 2 weeks to check and see how his      sugar control has been on the increased dose of regular insulin.     Marca Ancona, MD  Electronically Signed    DM/MedQ  DD: 11/07/2008  DT: 11/08/2008  Job #: [id number]

## 2011-02-23 NOTE — Assessment & Plan Note (Signed)
[facility]                            CARDIOLOGY OFFICE NOTE   NAME:[name], [name]                        MRN:          [id number]  DATE:05/02/2007                            DOB:          1981/06/04    Mr. Fusselman returns today after being discharged from the hospital and  having coronary bypass grafting on Feb 22, 2007.   He presented with unstable angina.  Cardiac catheterization showed  severe 3-vessel disease with normal left ventricular systolic function.  EF was 65%.  Please see the cath note for details.   He underwent coronary bypass grafting by Dr. Evelene Croon on Feb 22, 2007.  Four grafts were placed.  He had a left internal mammary graft  placed to the LAD, right radial graft to an obtuse marginal of the  circumflex, saphenous vein graft to a diagonal, and a saphenous vein  graft to the posterior descending in the right coronary artery.   His risk factors were tobacco use; diabetes for 6 years which was poorly  controlled (hemoglobin A1c was 12.9% on admission); mixed hyperlipidemia  with a total cholesterol of 221, LDL of 141, HDL of 29, triglycerides of  255; obesity; sedentary lifestyle; and hypertension.   Since discharge he has had some chest wall soreness.   SOCIAL HISTORY:  Remarkable in that he does not work.  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  He has a son that is 55 years old.  His wife works.  He does not  have insurance and is not insured by her plan.   CURRENT MEDICATIONS:  1. Aspirin 325 a day.  2. Lopressor 25 b.i.d. which he ran out of.  3. Zocor 80 mg nightly.  4. Lantus 40 units nightly.  5. Imdur 30 mg daily which he has discontinued.   His blood sugars are running about 140 or 150 in the mornings, 200 in  the evenings.  He does not have a primary care physician.  He is not  sure he can get into the walk in clinic or free clinic in [place].  He says it is hard to get in.   He denies any orthopnea, PND, peripheral  edema.   EXAMINATION:  Today his blood pressure is 142/86, his heart rate was 80  and regular, his weight is 276, he is very pleasant.  HEENT:  Normocephalic/atraumatic, PERRLA, extraocular movements intact,  sclerae are clear, facial symmetry is normal.  Carotids are full, there  is no JVD, there is no bruits, thyroid is not enlarged, trachea is  midline.  LUNGS:  Clear to auscultation.  CHEST:  Reveals stable sternotomy.  He has a poorly appreciated PMI, has  normal S1-S2 without murmur, rub, or gallop.  ABDOMINAL:  Soft but obese, bowel sounds are present.  There is no  obvious tenderness or organomegaly.  EXTREMITIES:  Reveal no edema.  Pulses are present.  NEURO:  Exam is intact.  SKIN:  Unremarkable.   Electrocardiogram shows normal sinus rhythm with nonspecific changes.   ASSESSMENT:  Mr. Bolger is an extremely young white male with  3-vessel  coronary artery disease.  Fortunately he has normal left ventricular  function.  He has multiple cardiac risk factors for which I have spent  at least 30 minutes talking to him about.   Because he has no insurance, we are going to try to see if [facility] or [facility] will take him on.  In addition we  will keep medicines as cheap and as effective as possible.  We will use  generics.   PLAN:  1. Continue aspirin 325 a day.  2. Continue simvastatin 80 mg nightly.  3. Lisinopril 20 mg p.o. daily.  4. We will not start his beta blocker nor his nitrate back, I do not      think they are necessary at present.   I have asked him to lose 10-15 pounds before his next visit with [place] in 2  months.  He knows not to do any heavy lifting for another month.  I have  advised him to look for a job.  I have advised him obviously not to ever  smoke again.   We will check lipids, LFTs, and a chem-7 today.   I will plan on seeing him back in 2 months.     Thomas C. Daleen Squibb, MD, [facility]  Electronically Signed    TCW/MedQ   DD: 05/02/2007  DT: 05/02/2007  Job #: [id number]   cc:   Evelene Croon, M.D.

## 2011-03-03 ENCOUNTER — Ambulatory Visit: Payer: Medicare Other | Admitting: Family Medicine

## 2011-03-03 DIAGNOSIS — Z0289 Encounter for other administrative examinations: Secondary | ICD-10-CM

## 2011-05-04 ENCOUNTER — Encounter: Payer: Self-pay | Admitting: Family Medicine

## 2011-05-04 ENCOUNTER — Ambulatory Visit (INDEPENDENT_AMBULATORY_CARE_PROVIDER_SITE_OTHER): Payer: Medicare Other | Admitting: Family Medicine

## 2011-05-04 VITALS — BP 118/80 | HR 84 | Temp 98.2°F | Wt 239.0 lb

## 2011-05-04 DIAGNOSIS — R51 Headache: Secondary | ICD-10-CM

## 2011-05-04 DIAGNOSIS — E785 Hyperlipidemia, unspecified: Secondary | ICD-10-CM

## 2011-05-04 DIAGNOSIS — I25810 Atherosclerosis of coronary artery bypass graft(s) without angina pectoris: Secondary | ICD-10-CM

## 2011-05-04 DIAGNOSIS — E119 Type 2 diabetes mellitus without complications: Secondary | ICD-10-CM

## 2011-05-04 DIAGNOSIS — R519 Headache, unspecified: Secondary | ICD-10-CM | POA: Insufficient documentation

## 2011-05-04 MED ORDER — CYCLOBENZAPRINE HCL 10 MG PO TABS: 10.0000 mg | ORAL_TABLET | Freq: Two times a day (BID) | ORAL | Status: DC | PRN

## 2011-05-04 MED ORDER — INSULIN ASPART PROT & ASPART (70-30 MIX) 100 UNIT/ML ~~LOC~~ SUSP: SUBCUTANEOUS | Status: DC

## 2011-05-04 MED ORDER — CARVEDILOL 3.125 MG PO TABS: 3.1250 mg | ORAL_TABLET | Freq: Two times a day (BID) | ORAL | Status: DC

## 2011-05-04 MED ORDER — LISINOPRIL 5 MG PO TABS: 5.0000 mg | ORAL_TABLET | Freq: Every day | ORAL | Status: DC

## 2011-05-04 MED ORDER — ASPIRIN 325 MG PO TABS: 162.5000 mg | ORAL_TABLET | Freq: Every day | ORAL | Status: DC

## 2011-05-04 NOTE — Assessment & Plan Note (Signed)
Per pt report improved control. Check A1c asked him to return in next few weeks for DM f/u asked to keep log of sugars for next visit

## 2011-05-04 NOTE — Assessment & Plan Note (Signed)
Actually on crestor samples, provided with more samples today. When runs out to start generic lipitor

## 2011-05-04 NOTE — Progress Notes (Signed)
  Subjective:    Patient ID: [name], male    DOB: 1981/08/28, 30 y.o.   MRN: [id number]  HPI CC: HA  H/o CAD s/p CABG at age 81yo, DM, HLD, seen last 01/2011, didn't f/u for 1 mo f/u of diabetes.  States still taking all meds, but needs refills of all.  Has not f/u with cards, states needs to reschedule.  Was on 81mg  aspirin, states has changed to 325mg  aspirin as cheaper.  DM - sugars running mid 100s.  Yesterday postprandial was 166.  Trying to stay active, drinking plenty of water.  No lows. Lab Results  Component Value Date   HGBA1C 12.4* 12/28/2010   Wt Readings from Last 3 Encounters:  05/04/11 239 lb (108.41 kg)  02/01/11 247 lb (112.038 kg)  12/22/10 251 lb (113.853 kg)   HA - started several months ago, worsening in last 3 wks.  L blurry vision, pain behind L eye, then travels to entire left head, some on right side as well.  Pain described as throb then stabbing.  Sleeping helps, cool rag helps some.  Getting HA 2-3x/day.  Occurring 3-4x/wk.  Has not had bad headaches in past.  Takes several ibuprofen to help control in last week as well.  Notices that if waits to take analgesic, lasts longer.  + photo/phonophobia.  No nausea/vomiting.  Endorses lights getting "sharper" and left eye tearing prior to getting headache.  Loses appetite with HA.  No h/o migraines in past.  Caffeine - 2 L diet pepsi in 2 days, 1 cup coffee in am. No smoking, quit 2006  Review of Systems Per HPI    Objective:   Physical Exam  Nursing note and vitals reviewed. Constitutional: He is oriented to person, place, and time. He appears well-developed and well-nourished. No distress.  HENT:  Head: Normocephalic and atraumatic.  Right Ear: External ear normal.  Left Ear: External ear normal.  Nose: Nose normal.  Mouth/Throat: Oropharynx is clear and moist. No oropharyngeal exudate.  Eyes: Conjunctivae and EOM are normal. Pupils are equal, round, and reactive to light. No scleral icterus.  Neck:  Neck supple.  Cardiovascular: Normal rate, regular rhythm, normal heart sounds and intact distal pulses.   No murmur heard. Pulmonary/Chest: Effort normal and breath sounds normal. No respiratory distress. He has no wheezes. He has no rales.  Musculoskeletal: He exhibits no edema.  Lymphadenopathy:    He has no cervical adenopathy.  Neurological: He is alert and oriented to person, place, and time. He has normal strength. He displays normal reflexes. No cranial nerve deficit or sensory deficit. Coordination normal.  Skin: Skin is warm and dry. No rash noted.  Psychiatric: He has a normal mood and affect.          Assessment & Plan:

## 2011-05-04 NOTE — Progress Notes (Signed)
Addended by: Alvina Chou on: 05/04/2011 02:07 PM   Modules accepted: Orders

## 2011-05-04 NOTE — Patient Instructions (Signed)
Sounds like migraines. For headaches try flexeril, may make you sleep headache off.  Take at earliest sign of headache. Goal sleep is 7-8 hours/day.  Try to get this much on a regular basis, this may help improve headaches. Try to minimize caffeine intake. If headaches not improving or more frequent than 3-4 x/wk, may need to start daily preventative medicine. A1c checked today. Return to see me in next few weeks to follow diabetes, keep log of sugars 1 wk prior.

## 2011-05-04 NOTE — Assessment & Plan Note (Addendum)
Consistent with migraines. Discussed triggers as well as limiting caffeine, getting enough rest daily Discussed MOH and to limit advil for this reason as well as GI upset and less efficacy of ASA with regular NSAID use. Flexeril for abortive.  If not improving, return for discussion on ppx medication, consider starting topamax vs amitriptyline.

## 2011-05-04 NOTE — Assessment & Plan Note (Signed)
Advised to cut ASA 325mg  in half for daily use.  Dose of 160mg  daily.

## 2011-05-18 ENCOUNTER — Ambulatory Visit: Payer: Medicare Other | Admitting: Family Medicine

## 2011-06-11 ENCOUNTER — Ambulatory Visit: Payer: Medicare Other | Admitting: Family Medicine

## 2011-06-15 ENCOUNTER — Observation Stay: Payer: Commercial Managed Care - HMO | Admitting: *Deleted

## 2011-06-15 DIAGNOSIS — R079 Chest pain, unspecified: Secondary | ICD-10-CM

## 2011-06-15 LAB — PROTIME-INR

## 2011-06-24 ENCOUNTER — Ambulatory Visit (INDEPENDENT_AMBULATORY_CARE_PROVIDER_SITE_OTHER): Payer: Medicare Other | Admitting: Family Medicine

## 2011-06-24 ENCOUNTER — Encounter: Payer: Self-pay | Admitting: Family Medicine

## 2011-06-24 DIAGNOSIS — I25810 Atherosclerosis of coronary artery bypass graft(s) without angina pectoris: Secondary | ICD-10-CM

## 2011-06-24 DIAGNOSIS — E785 Hyperlipidemia, unspecified: Secondary | ICD-10-CM

## 2011-06-24 DIAGNOSIS — E119 Type 2 diabetes mellitus without complications: Secondary | ICD-10-CM

## 2011-06-24 DIAGNOSIS — I10 Essential (primary) hypertension: Secondary | ICD-10-CM

## 2011-06-24 DIAGNOSIS — R079 Chest pain, unspecified: Secondary | ICD-10-CM

## 2011-06-24 MED ORDER — METFORMIN HCL 500 MG PO TABS: 500.0000 mg | ORAL_TABLET | Freq: Two times a day (BID) | ORAL | Status: DC

## 2011-06-24 NOTE — Assessment & Plan Note (Signed)
Chronic condition, uncontrolled. Lab Results  Component Value Date   HGBA1C 13.0* 05/04/2011  in hospital early 06/2011 with A1c 10.9% Start metformin, discussed early GI side effects. Discussed importance of compliance with meds and need for chronic meds.

## 2011-06-24 NOTE — Assessment & Plan Note (Signed)
F/u with cards next week. Seems stable curently.  No sxs.

## 2011-06-24 NOTE — Progress Notes (Signed)
  Subjective:    Patient ID: [name], male    DOB: November 18, 1980, 30 y.o.   MRN: [id number]  HPI CC: [facility] f/u  30 yo with h/o T2DM uncontrolled, HLD uncontrolled, HTN and CAD s/p CABG presents for f/u [facility] hospitalization for chest pain.  CP thought to be MSK as tele normal, EKG normal, r/o MI with normal cardiac enzymes.  Described knot feeling under chest that worsened.  Pain not relieved with motrin.  BP was found to be running low so B blocker was held.  Has not followed up as directed several times since establishing care with me.  States jeep broke down.  Has f/u appt with [facility] next week 06/30/2011.  No more chest pain since discharge from hospital.  Some "stiffness" sensation in chest.  Trying to work out more - would like to train with friend who dose mixed martial arts.  Started doing exercise 3 days ago.  Did run 1 lap this morning and walked additional 30 min.  Today in office did feel shakey with cbg 82 (tends to get hypoglycemic sxs if cbg <90).  Given cracker, juice, felt better.  Previous admission 07/2010 with myoview showing normal LV fxn, fixed defect due to prior MI, some hypokinesis but no ischemia.  DM - states last test strips had expired so wasn't getting accurate readings.  brings log from after d/c - checking 4x/day and sugars fasting running 118-191, pre mealtime ranging from 90s to 200s, bedtime sugars ranging from 140-high 200s.  Previously referred to Dr. Tedd Sias endo, has never seen.  Has been to diabetes education.  Some lows when exercising.  Last vision screen was about 1 year ago.  Last foot exam unsure.  HLD - in hospital, chol 155, trig 406, HDL 24.  Prior well controlled with crestor.    Using crestor 20mg , samples, has atorvastatin 80mg  script at pharmacy.  CAD - ran out of plavix samples.  Awaiting call back from cards re this.  No SOB, dizziness, HA.  Wt Readings from Last 3 Encounters:  06/24/11 243 lb 12 oz (110.564 kg)  05/04/11 239 lb  (108.41 kg)  02/01/11 247 lb (112.038 kg)   Review of Systems Per HPI    Objective:   Physical Exam  Nursing note and vitals reviewed. Constitutional: He appears well-developed and well-nourished. No distress.  HENT:  Head: Normocephalic and atraumatic.  Right Ear: External ear normal.  Left Ear: External ear normal.  Nose: Nose normal.  Mouth/Throat: Oropharynx is clear and moist. No oropharyngeal exudate.  Eyes: Conjunctivae and EOM are normal. Pupils are equal, round, and reactive to light. No scleral icterus.  Neck: Normal range of motion. Neck supple. Carotid bruit is not present. No thyromegaly present.  Cardiovascular: Normal rate, regular rhythm, normal heart sounds and intact distal pulses.   No murmur heard. Pulmonary/Chest: Effort normal and breath sounds normal. No respiratory distress. He has no wheezes. He has no rales.  Musculoskeletal: He exhibits no edema.       Diabetic foot exam: Normal inspection No skin breakdown No calluses  Normal DP/PT pulses Diminished sensation to monofilament Nails normal  Lymphadenopathy:    He has no cervical adenopathy.  Skin: Skin is warm and dry. No rash noted.  Psychiatric: He has a normal mood and affect.          Assessment & Plan:

## 2011-06-24 NOTE — Assessment & Plan Note (Signed)
anticipate MSK.  Using ibuprofen as needed, continue to monitor.

## 2011-06-24 NOTE — Assessment & Plan Note (Signed)
Per patient had been off meds when FLP checked in hospital. encouraged restart meds.  Has crestor samples at home, to start lipitor when runs out crestor.

## 2011-06-24 NOTE — Patient Instructions (Addendum)
Keep appointment next week with cardiology. For diabetes - start metformin 500mg  nightly, may cause stomach upset initially.  After 1 week go up to one pill twice daily.  Continue novolog 70/30 40 units twice daily. For cholesterol levels - finish crestor samples (20mg  nightly) then start atorvastatin which is generic for lipitor at night time (prescription in pharmacy).  If any problems with pharmacy, call us at [phone]. For blood pressure just stay on lisinopril, hold coreg for now. Look into glucerna if unable to make meal.  With insulin we want 3 balanced meals a day. Return to see me in 1 month for follow up.

## 2011-06-24 NOTE — Assessment & Plan Note (Signed)
Continue to hold metoprolol, recheck with cards next week.  Doesn't seem needed currently to control BP, may need for h/o CAD.

## 2011-06-25 ENCOUNTER — Encounter: Payer: Self-pay | Admitting: Family Medicine

## 2011-07-01 ENCOUNTER — Encounter: Payer: Self-pay | Admitting: Family Medicine

## 2011-07-01 ENCOUNTER — Ambulatory Visit: Payer: Medicare Other | Admitting: Cardiology

## 2011-07-02 LAB — POCT I-STAT CREATININE: Creatinine, Ser: 0.8

## 2011-07-02 LAB — LIPID PANEL
Cholesterol: 254 — ABNORMAL HIGH
HDL: 29 — ABNORMAL LOW
LDL Cholesterol: 176 — ABNORMAL HIGH
Total CHOL/HDL Ratio: 8.8
Triglycerides: 247 — ABNORMAL HIGH

## 2011-07-02 LAB — I-STAT 8, (EC8 V) (CONVERTED LAB)
BUN: 12
Glucose, Bld: 328 — ABNORMAL HIGH
Hemoglobin: 15.3
Potassium: 3.8
Sodium: 135
TCO2: 24
pH, Ven: 7.356 — ABNORMAL HIGH

## 2011-07-02 LAB — CBC
HCT: 40.1
HCT: 40.8
Hemoglobin: 13.6
Hemoglobin: 13.7
MCHC: 33.6
MCV: 83.7
MCV: 84.5
RBC: 4.83
RBC: 4.92
RDW: 12.9
WBC: 7
WBC: 8.2

## 2011-07-02 LAB — COMPREHENSIVE METABOLIC PANEL
ALT: 21
BUN: 10
CO2: 24
Calcium: 8.8
Creatinine, Ser: 0.68
GFR calc non Af Amer: >60
Glucose, Bld: 244 — ABNORMAL HIGH
Total Protein: 6.3

## 2011-07-02 LAB — BASIC METABOLIC PANEL
BUN: 6
CO2: 25
Calcium: 8.5
Chloride: 110
Creatinine, Ser: 0.61
GFR calc Af Amer: >60

## 2011-07-02 LAB — PROTIME-INR
INR: 0.9
Prothrombin Time: 11.8

## 2011-07-02 LAB — POCT CARDIAC MARKERS
CKMB, poc: 1
Myoglobin, poc: 29.2
Myoglobin, poc: 30.2
Operator id: 277751

## 2011-07-02 LAB — CK TOTAL AND CKMB (NOT AT ARMC): Relative Index: INVALID

## 2011-07-02 LAB — DIFFERENTIAL
Basophils Relative: 0
Lymphs Abs: 3.5
Monocytes Relative: 7
Neutro Abs: 4
Neutrophils Relative %: 48

## 2011-07-02 LAB — CARDIAC PANEL(CRET KIN+CKTOT+MB+TROPI)
CK, MB: 1.2
Relative Index: INVALID
Relative Index: INVALID
Troponin I: 0.01

## 2011-07-02 LAB — HEMOGLOBIN A1C
Hgb A1c MFr Bld: 11.8 — ABNORMAL HIGH
Mean Plasma Glucose: 343

## 2011-07-02 LAB — APTT: aPTT: 22 — ABNORMAL LOW

## 2011-07-02 LAB — HEPARIN LEVEL (UNFRACTIONATED): Heparin Unfractionated: 0.53

## 2011-07-07 LAB — CBC
HCT: 42.5
Hemoglobin: 14.5
MCHC: 34.1
Platelets: 247
RDW: 12.3

## 2011-07-08 ENCOUNTER — Ambulatory Visit (INDEPENDENT_AMBULATORY_CARE_PROVIDER_SITE_OTHER): Payer: Medicare Other | Admitting: Cardiovascular Disease

## 2011-07-08 ENCOUNTER — Encounter: Payer: Self-pay | Admitting: Cardiovascular Disease

## 2011-07-08 VITALS — BP 128/90 | HR 74 | Ht 72.0 in | Wt 253.0 lb

## 2011-07-08 DIAGNOSIS — E669 Obesity, unspecified: Secondary | ICD-10-CM

## 2011-07-08 DIAGNOSIS — R079 Chest pain, unspecified: Secondary | ICD-10-CM

## 2011-07-08 DIAGNOSIS — E119 Type 2 diabetes mellitus without complications: Secondary | ICD-10-CM

## 2011-07-08 DIAGNOSIS — I25810 Atherosclerosis of coronary artery bypass graft(s) without angina pectoris: Secondary | ICD-10-CM

## 2011-07-08 DIAGNOSIS — E785 Hyperlipidemia, unspecified: Secondary | ICD-10-CM

## 2011-07-08 DIAGNOSIS — I2510 Atherosclerotic heart disease of native coronary artery without angina pectoris: Secondary | ICD-10-CM

## 2011-07-08 MED ORDER — CARVEDILOL 3.125 MG PO TABS: 3.1250 mg | ORAL_TABLET | Freq: Two times a day (BID) | ORAL | Status: DC

## 2011-07-08 MED ORDER — ASPIRIN EC 325 MG PO TBEC: 325.0000 mg | DELAYED_RELEASE_TABLET | Freq: Every day | ORAL | Status: DC

## 2011-07-08 NOTE — Assessment & Plan Note (Signed)
We have encouraged continued exercise, careful diet management in an effort to lose weight. 

## 2011-07-08 NOTE — Assessment & Plan Note (Signed)
We have strongly encouraged him to watch his diet, be more compliant with his insulin as poor diabetes control will significantly affect his coronary artery disease and progression.

## 2011-07-08 NOTE — Patient Instructions (Signed)
You are doing well. Please start coreg one in the am and PM Hold plavix Increase asa to 325 mg daily Please call us if you have new issues that need to be addressed before your next appt.  We will call you for a follow up Appt. In 6 months

## 2011-07-08 NOTE — Assessment & Plan Note (Signed)
Recent chest pain symptoms were felt to be not secondary to active ischemia. Medical management recommended. No further testing at this time.

## 2011-07-08 NOTE — Assessment & Plan Note (Signed)
He currently has Crestor samples. If he is unable to afford this which is likely, we will need to change him to lovastatin 80 mg daily.

## 2011-07-08 NOTE — Assessment & Plan Note (Signed)
Currently with no symptoms of angina. No further workup at this time. Continue current medication regimen. 

## 2011-07-08 NOTE — Progress Notes (Signed)
Patient ID: [name], male    DOB: 09/21/1981, 30 y.o.   MRN: [id number]  HPI Comments: 30 yo with h/o premature CAD s/p CABG and PCI, Smoking history, presents for followup After recent evaluation at [facility].  Cardiology was consult it on June 15 2011 for chest pain. Cardiac enzymes were negative. Chest and was felt to be atypical in nature. It did improve with pain medication. He had a stress test last year that showed no ischemia. Last catheterization 2 years ago showing patent grafts. Labs showed poorly controlled diabetes. Medication noncompliance is a big issue.  He presents today and reports that he's had no further chest pain. He is currently taking most of his medications though does not have Plavix as it is expensive. He is using Crestor samples.  chest pain in 10/11 and went to [facility] where he had a stress test showing basal to mid inferolateral scar and a small area of scar in the mid anteroseptum.  There was no ischemia.  Seen in the clinic 11/11, at which time he was out of his medications and was restarted on them.  In followup clinic visit, he has not been on his medications.   ECG: NSR With rate 74 beats per minute, nonspecific ST abnormality in V3 through V6, 2, 3, aVF      Outpatient Encounter Prescriptions as of 07/08/2011  Medication Sig Dispense Refill  . ibuprofen (ADVIL,MOTRIN) 200 MG tablet Take 200 mg by mouth every 6 (six) hours as needed.        . insulin aspart protamine-insulin aspart (NOVOLOG 70/30) (70-30) 100 UNIT/ML injection 40 units in the morning, 40 units at dinner       . lisinopril (PRINIVIL,ZESTRIL) 5 MG tablet Take 1 tablet (5 mg total) by mouth daily.  90 tablet  3  . metFORMIN (GLUCOPHAGE) 500 MG tablet Take 1 tablet (500 mg total) by mouth 2 (two) times daily with a meal.  60 tablet  11  . rosuvastatin (CRESTOR) 10 MG tablet Take 20 mg by mouth at bedtime.        Marland Kitchen aspirin EC 81 MG tablet Take 81 mg by mouth daily.           Review of  Systems  Constitutional: Negative.   HENT: Negative.   Eyes: Negative.   Respiratory: Negative.   Cardiovascular: Negative.   Gastrointestinal: Negative.   Musculoskeletal: Negative.   Skin: Negative.   Neurological: Negative.   Hematological: Negative.   Psychiatric/Behavioral: Negative.   All other systems reviewed and are negative.    BP 128/90  Pulse 74  Ht 6' (1.829 m)  Wt 253 lb (114.76 kg)  BMI 34.31 kg/m2  Physical Exam  Nursing note and vitals reviewed. Constitutional: He is oriented to person, place, and time. He appears well-developed and well-nourished.  HENT:  Head: Normocephalic.  Nose: Nose normal.  Mouth/Throat: Oropharynx is clear and moist.  Eyes: Conjunctivae are normal. Pupils are equal, round, and reactive to light.  Neck: Normal range of motion. Neck supple. No JVD present.  Cardiovascular: Normal rate, regular rhythm, S1 normal, S2 normal, normal heart sounds and intact distal pulses.  Exam reveals no gallop and no friction rub.   No murmur heard. Pulmonary/Chest: Effort normal and breath sounds normal. No respiratory distress. He has no wheezes. He has no rales. He exhibits no tenderness.  Abdominal: Soft. Bowel sounds are normal. He exhibits no distension. There is no tenderness.  Musculoskeletal: Normal range of motion. He exhibits  no edema and no tenderness.  Lymphadenopathy:    He has no cervical adenopathy.  Neurological: He is alert and oriented to person, place, and time. Coordination normal.  Skin: Skin is warm and dry. No rash noted. No erythema.  Psychiatric: He has a normal mood and affect. His behavior is normal. Judgment and thought content normal.           Assessment and Plan

## 2011-07-19 ENCOUNTER — Telehealth: Payer: Self-pay

## 2011-07-23 LAB — CBC
HCT: 38.7 — ABNORMAL LOW
HCT: 39
Hemoglobin: 13.2
Hemoglobin: 13.3
Hemoglobin: 13.5
Hemoglobin: 13.6
MCHC: 34
MCHC: 34
MCHC: 34.1
MCHC: 34.1
MCHC: 34.3
MCV: 81.5
MCV: 82.3
MCV: 82.6
Platelets: 244
Platelets: 297
RBC: 4.74
RBC: 4.92
RDW: 13
RDW: 13.5
RDW: 13.7
RDW: 13.9
WBC: 7.6

## 2011-07-23 LAB — CK TOTAL AND CKMB (NOT AT ARMC)
CK, MB: 1.3
CK, MB: 1.4
Relative Index: INVALID
Relative Index: INVALID
Relative Index: INVALID
Relative Index: INVALID
Total CK: 48
Total CK: 52
Total CK: 63
Total CK: 81

## 2011-07-23 LAB — PROTIME-INR
INR: 0.9
INR: 1
INR: 1.3
Prothrombin Time: 12.5
Prothrombin Time: 13.6
Prothrombin Time: 16.5 — ABNORMAL HIGH

## 2011-07-23 LAB — DIFFERENTIAL
Basophils Absolute: 0
Basophils Relative: 1
Monocytes Relative: 8
Neutro Abs: 3.5
Neutrophils Relative %: 46

## 2011-07-23 LAB — BASIC METABOLIC PANEL
CO2: 27
Calcium: 8.9
Creatinine, Ser: 0.69
GFR calc Af Amer: >60
GFR calc non Af Amer: >60
Glucose, Bld: 126 — ABNORMAL HIGH
Sodium: 140

## 2011-07-23 LAB — COMPREHENSIVE METABOLIC PANEL
Albumin: 3.2 — ABNORMAL LOW
Alkaline Phosphatase: 52
BUN: 10
BUN: 11
BUN: 6
CO2: 25
CO2: 25
Calcium: 8.2 — ABNORMAL LOW
Calcium: 9
Creatinine, Ser: 0.6
GFR calc non Af Amer: >60
GFR calc non Af Amer: >60
Glucose, Bld: 172 — ABNORMAL HIGH
Glucose, Bld: 214 — ABNORMAL HIGH
Potassium: 4
Total Bilirubin: 0.4
Total Protein: 6
Total Protein: 6
Total Protein: 6.6

## 2011-07-23 LAB — TROPONIN I
Troponin I: 0.01
Troponin I: 0.02

## 2011-07-23 LAB — CARDIAC PANEL(CRET KIN+CKTOT+MB+TROPI)
CK, MB: 1.2
Relative Index: INVALID
Total CK: 46
Troponin I: 0.02

## 2011-07-23 LAB — LIPID PANEL
Cholesterol: 134
HDL: 31 — ABNORMAL LOW
LDL Cholesterol: 70
Total CHOL/HDL Ratio: 4.3
Triglycerides: 167 — ABNORMAL HIGH
VLDL: 33

## 2011-07-23 LAB — BASIC METABOLIC PANEL WITH GFR
BUN: 6
CO2: 26
Calcium: 9.2
Chloride: 107
Creatinine, Ser: 0.7
GFR calc Af Amer: >60
GFR calc non Af Amer: >60
Glucose, Bld: 158 — ABNORMAL HIGH
Potassium: 3.6
Sodium: 141

## 2011-07-23 LAB — APTT
aPTT: 114 — ABNORMAL HIGH
aPTT: 147 — ABNORMAL HIGH
aPTT: 26

## 2011-07-23 LAB — HEPARIN LEVEL (UNFRACTIONATED)
Heparin Unfractionated: 0.49
Heparin Unfractionated: 0.64
Heparin Unfractionated: <0.1 — ABNORMAL LOW

## 2011-07-30 ENCOUNTER — Telehealth: Payer: Self-pay

## 2011-07-30 ENCOUNTER — Ambulatory Visit: Payer: Medicare Other | Admitting: Family Medicine

## 2011-07-30 MED ORDER — LISINOPRIL 5 MG PO TABS: 5.0000 mg | ORAL_TABLET | Freq: Every day | ORAL | Status: DC

## 2011-07-30 NOTE — Telephone Encounter (Signed)
Refill sent for lisinopril  

## 2011-07-30 NOTE — Telephone Encounter (Signed)
Nicolette Bang requested a refill for lisinopril.

## 2011-08-02 ENCOUNTER — Ambulatory Visit: Payer: Medicare Other | Admitting: Family Medicine

## 2011-08-02 DIAGNOSIS — Z0289 Encounter for other administrative examinations: Secondary | ICD-10-CM

## 2011-08-05 ENCOUNTER — Encounter: Payer: Self-pay | Admitting: Cardiovascular Disease

## 2011-09-30 ENCOUNTER — Telehealth: Payer: Self-pay | Admitting: Cardiovascular Disease

## 2011-09-30 NOTE — Telephone Encounter (Signed)
Pt needs a letter stating that he is ok to work with [employer]. Please call pt with questions

## 2011-09-30 NOTE — Telephone Encounter (Signed)
Pt seen 07/08/11; h/o CAD s/p CABG and PCI 2007, DM, smoking. He does not take Plavix due to cost issue. CP 10/11 went to [facility] had stress test showing old scar, no ischemia. Last cath 2 yrs ago, patent grafts. Per last note recent chest pain symptoms were felt to be not secondary to active ischemia.   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 He denies any chest pain symptoms since last seen in office. Told pt will need to ask Dr. Mariah Milling, may not get back to today, but will call with his recc. Pt ok with this. Please advise.

## 2011-10-04 ENCOUNTER — Encounter: Payer: Self-pay | Admitting: *Deleted

## 2011-10-04 NOTE — Telephone Encounter (Signed)
Ok to write note. Plavix is cheap now. Maybe he can afford it.

## 2011-10-04 NOTE — Telephone Encounter (Signed)
Pt notified, will mail letter per pt request. He asks if ok to still just take the ASA 325, or do you recc he take the Plavix too. I did notify it is generic now, but he is not very interested in starting. Please advise.

## 2011-10-10 NOTE — Telephone Encounter (Signed)
His choice. Would prefer asa 81 x 2 with plavix 75. Would also make sure he is taking crestor or some statin

## 2011-10-11 NOTE — Telephone Encounter (Signed)
Spoke to pt, he chooses to just stay on ASA 325 daily until finances are better. He will call when he wants to start Plavix. Pt knows Dr. Mariah Milling prefers he take Plavix with ASA 81 x 2. Pt does take Crestor 20mg  daily.

## 2012-02-15 ENCOUNTER — Inpatient Hospital Stay (HOSPITAL_COMMUNITY)
Admission: EM | Admit: 2012-02-15 | Discharge: 2012-02-18 | DRG: 313 | Disposition: A | Discharge status: Home / Self Care | Payer: Medicare Other | Source: Ambulatory Visit | Attending: Internal Medicine | Admitting: Internal Medicine

## 2012-02-15 ENCOUNTER — Emergency Department (HOSPITAL_COMMUNITY): Payer: Medicare Other

## 2012-02-15 ENCOUNTER — Encounter (HOSPITAL_COMMUNITY): Payer: Self-pay | Admitting: Emergency Medicine

## 2012-02-15 DIAGNOSIS — R079 Chest pain, unspecified: Secondary | ICD-10-CM | POA: Diagnosis present

## 2012-02-15 DIAGNOSIS — I2510 Atherosclerotic heart disease of native coronary artery without angina pectoris: Secondary | ICD-10-CM | POA: Diagnosis present

## 2012-02-15 DIAGNOSIS — I25810 Atherosclerosis of coronary artery bypass graft(s) without angina pectoris: Secondary | ICD-10-CM | POA: Diagnosis present

## 2012-02-15 DIAGNOSIS — E669 Obesity, unspecified: Secondary | ICD-10-CM | POA: Diagnosis present

## 2012-02-15 DIAGNOSIS — Z8673 Personal history of transient ischemic attack (TIA), and cerebral infarction without residual deficits: Secondary | ICD-10-CM

## 2012-02-15 DIAGNOSIS — E785 Hyperlipidemia, unspecified: Secondary | ICD-10-CM | POA: Diagnosis present

## 2012-02-15 DIAGNOSIS — Z87891 Personal history of nicotine dependence: Secondary | ICD-10-CM

## 2012-02-15 DIAGNOSIS — IMO0002 Reserved for concepts with insufficient information to code with codable children: Secondary | ICD-10-CM | POA: Diagnosis present

## 2012-02-15 DIAGNOSIS — IMO0001 Reserved for inherently not codable concepts without codable children: Secondary | ICD-10-CM | POA: Diagnosis present

## 2012-02-15 DIAGNOSIS — I10 Essential (primary) hypertension: Secondary | ICD-10-CM | POA: Diagnosis present

## 2012-02-15 DIAGNOSIS — E1065 Type 1 diabetes mellitus with hyperglycemia: Secondary | ICD-10-CM | POA: Diagnosis present

## 2012-02-15 DIAGNOSIS — R55 Syncope and collapse: Secondary | ICD-10-CM | POA: Diagnosis present

## 2012-02-15 DIAGNOSIS — Z951 Presence of aortocoronary bypass graft: Secondary | ICD-10-CM

## 2012-02-15 DIAGNOSIS — R0789 Other chest pain: Principal | ICD-10-CM | POA: Diagnosis present

## 2012-02-15 LAB — COMPREHENSIVE METABOLIC PANEL
ALT: 22 U/L (ref 0–53)
AST: 19 U/L (ref 0–37)
Albumin: 3.8 g/dL (ref 3.5–5.2)
CO2: 24 mEq/L (ref 19–32)
Chloride: 103 mEq/L (ref 96–112)
Creatinine, Ser: 0.63 mg/dL (ref 0.50–1.35)
GFR calc non Af Amer: >90 mL/min (ref >90)
Potassium: 4.3 mEq/L (ref 3.5–5.1)
Sodium: 138 mEq/L (ref 135–145)
Total Bilirubin: 0.2 mg/dL — ABNORMAL LOW (ref 0.3–1.2)

## 2012-02-15 LAB — CBC
MCHC: 35.9 g/dL (ref 30.0–36.0)
Platelets: 226 10*3/uL (ref 150–400)
RDW: 11.7 % (ref 11.5–15.5)
WBC: 6.3 10*3/uL (ref 4.0–10.5)

## 2012-02-15 LAB — TROPONIN I: Troponin I: <0.3 ng/mL (ref <0.30)

## 2012-02-15 LAB — DIFFERENTIAL
Basophils Absolute: 0 10*3/uL (ref 0.0–0.1)
Basophils Relative: 0 % (ref 0–1)
Lymphocytes Relative: 28 % (ref 12–46)
Neutro Abs: 4.2 10*3/uL (ref 1.7–7.7)
Neutrophils Relative %: 66 % (ref 43–77)

## 2012-02-15 MED ORDER — SODIUM CHLORIDE 0.9 % IV SOLN
Freq: Once | INTRAVENOUS | Status: DC
  Filled 2012-02-15: qty 1

## 2012-02-15 MED ORDER — DEXTROSE 50 % IV SOLN: 25.0000 mL | INTRAVENOUS | Status: DC | PRN

## 2012-02-15 MED ORDER — DEXTROSE-NACL 5-0.45 % IV SOLN: INTRAVENOUS | Status: DC

## 2012-02-15 MED ORDER — SODIUM CHLORIDE 0.9 % IV SOLN: INTRAVENOUS | Status: DC

## 2012-02-15 MED ORDER — SODIUM CHLORIDE 0.9 % IV BOLUS (SEPSIS)
1000.0000 mL | Freq: Once | INTRAVENOUS | Status: AC
  Administered 2012-02-15: 1000 mL via INTRAVENOUS

## 2012-02-15 NOTE — ED Notes (Signed)
PT requesting a drink.

## 2012-02-15 NOTE — ED Provider Notes (Addendum)
History     CSN: [id number]  Arrival date & time 02/15/12  2154   First MD Initiated Contact with Patient 02/15/12 2206      Chief Complaint  Patient presents with  . Altered Mental Status     HPI This young male with a history of coronary artery disease, including prior stenting now presents following an episode of chest pain.  Per EMS, the patient was coaching a baseball game, complaining of the sudden onset sternal chest pain, burning, sharp, and collapsed.  Per their report the patient was drowsy on their arrival, but interacted with direct questioning, and has vital signs in route.  He received nitroglycerin en route, as well as nitro paste.  The patient had no new syncopal events.  The patient is awake, responding briefly on initial arrival, denies pain. Past Medical History  Diagnosis Date  . Coronary atherosclerosis of artery bypass graft   . Chest pain, unspecified   . Type II or unspecified type diabetes mellitus without mention of complication, not stated as uncontrolled 2002    poorly controlled  . HLD (hyperlipidemia)     poorly controlled  . HTN (hypertension)   . Obesity, unspecified   . Hx-TIA (transient ischemic attack) 2009    possible-([facility]) Normal MRI of brain and MRA of the head and neck    Past Surgical History  Procedure Date  . Coronary artery bypass graft 2007    x 4 (age 75)    Family History  Problem Relation Age of Onset  . Emphysema Father     + smoker  . Coronary artery disease Maternal Grandfather   . Heart attack Maternal Grandfather   . Diabetes Maternal Grandfather   . Hypertension Maternal Grandfather   . Hyperlipidemia Maternal Grandfather   . Lung cancer      paternal uncles and aunts (all smokers)  . Coronary artery disease      premature-family history    History  Substance Use Topics  . Smoking status: Former Smoker    Quit date: 10/11/2004  . Smokeless tobacco: Not on file   Comment: used to smoke 1 1/2 ppd  . Alcohol  Use: No      Review of Systems  Unable to perform ROS: Mental status change    Allergies  Review of patient's allergies indicates no known allergies.  Home Medications   Current Outpatient Rx  Name Route Sig Dispense Refill  . ASPIRIN EC 325 MG PO TBEC Oral Take 1 tablet (325 mg total) by mouth daily. 90 tablet 4  . CARVEDILOL 3.125 MG PO TABS Oral Take 1 tablet (3.125 mg total) by mouth 2 (two) times daily. 60 tablet 11  . IBUPROFEN 200 MG PO TABS Oral Take 200 mg by mouth every 6 (six) hours as needed.      . INSULIN ASPART PROT & ASPART (70-30) 100 UNIT/ML [place] SUSP  40 units in the morning, 40 units at dinner     . LISINOPRIL 5 MG PO TABS Oral Take 1 tablet (5 mg total) by mouth daily. 30 tablet 6  . METFORMIN HCL 500 MG PO TABS Oral Take 1 tablet (500 mg total) by mouth 2 (two) times daily with a meal. 60 tablet 11  . ROSUVASTATIN CALCIUM 10 MG PO TABS Oral Take 20 mg by mouth at bedtime.        BP 126/80  Pulse 105  Temp(Src) 98 F (36.7 C) (Oral)  Resp 22  SpO2 97%  Physical Exam  Nursing note and vitals reviewed. Constitutional: He is oriented to person, place, and time. He appears well-developed and well-nourished. No distress.  HENT:  Head: Normocephalic and atraumatic.       Patient's eyes are closed, but he'll open them on command.  He tracks appropriately  Eyes: Conjunctivae and EOM are normal.  Cardiovascular: Normal rate and regular rhythm.   Pulmonary/Chest: Effort normal. No stridor. No respiratory distress.  Abdominal: Soft. He exhibits no distension. There is no guarding.  Musculoskeletal: He exhibits no edema.  Neurological: He is alert and oriented to person, place, and time. He displays no atrophy. No cranial nerve deficit. He exhibits normal muscle tone.       Patient response to verbal stimuli, but is lying essentially motionless.  He was ultimately spontaneously, and has appropriate strength throughout when prompted directly    ED Course    Procedures (including critical care time)   Labs Reviewed  CBC  DIFFERENTIAL  COMPREHENSIVE METABOLIC PANEL  LIPASE, BLOOD  TROPONIN I   No results found.   No diagnosis found.  Cardiac 95 sinus rhythm normal Pulse oximetry 97% room air normal  11:50 PM The patient's wife has arrived.  She notes that just prior to the patient describing the onset of chest pain, he had an episode of nausea.  There is no vomiting, nor any cognitive changes noted at the time.   12:11 AM I discussed the case with Dr. Mayford Knife, covering for .  The patient will have CTA, and be admitted to Hospitalist service for further e/m. MDM  This young male with a history of cardiac disease, obesity and diabetes now presents with chest pain.  On my exam the patient is in no distress, though he is listless.  The patient is awake and easily, is oriented appropriately.  There are no focal neurologic deficits, but the patient's absence of spontaneous motion is notable.  Patient's labs are largely reassuring, though with any concern for ongoing cardiac issue, the patient requires serial laboratory evaluation for accurate assessment.  Patient's ECG is tachycardic, with diffuse superficial ST elevations, but no depressions.  Given the patient's notable history, he will be admitted for further evaluation and management.       Gerhard Munch, MD 02/15/12 2352  Gerhard Munch, MD 02/16/12 1610

## 2012-02-15 NOTE — ED Notes (Signed)
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 Pt arrived drowsy and no knowing where he was was and only able to say his name.

## 2012-02-15 NOTE — ED Notes (Signed)
Xray at bedside; pt beginning to open eyes more in response to stimuli. Phlebotomy to draw rainbow.

## 2012-02-15 NOTE — ED Notes (Signed)
1 spray nitro; inch of nitro paste and 4 morphine given by EMS. EMS reported too drowsy to give aspirin. CBG 300. PT reported no relief.

## 2012-02-15 NOTE — ED Notes (Signed)
MD at bedside. 

## 2012-02-16 ENCOUNTER — Encounter (HOSPITAL_COMMUNITY): Payer: Self-pay | Admitting: Radiology

## 2012-02-16 ENCOUNTER — Emergency Department (HOSPITAL_COMMUNITY): Payer: Medicare Other

## 2012-02-16 DIAGNOSIS — R55 Syncope and collapse: Secondary | ICD-10-CM

## 2012-02-16 DIAGNOSIS — I10 Essential (primary) hypertension: Secondary | ICD-10-CM

## 2012-02-16 DIAGNOSIS — E1165 Type 2 diabetes mellitus with hyperglycemia: Secondary | ICD-10-CM

## 2012-02-16 DIAGNOSIS — R072 Precordial pain: Secondary | ICD-10-CM

## 2012-02-16 DIAGNOSIS — R079 Chest pain, unspecified: Secondary | ICD-10-CM | POA: Diagnosis present

## 2012-02-16 DIAGNOSIS — IMO0001 Reserved for inherently not codable concepts without codable children: Secondary | ICD-10-CM

## 2012-02-16 LAB — CARDIAC PANEL(CRET KIN+CKTOT+MB+TROPI)
CK, MB: 2.3 ng/mL (ref 0.3–4.0)
CK, MB: 2.8 ng/mL (ref 0.3–4.0)
Relative Index: INVALID (ref 0.0–2.5)
Relative Index: INVALID (ref 0.0–2.5)
Total CK: 85 U/L (ref 7–232)
Total CK: 68 U/L (ref 7–232)
Total CK: 63 U/L (ref 7–232)
Total CK: 59 U/L (ref 7–232)
Troponin I: <0.3 ng/mL (ref <0.30)

## 2012-02-16 LAB — CBC
HCT: 37.9 % — ABNORMAL LOW (ref 39.0–52.0)
Hemoglobin: 13.2 g/dL (ref 13.0–17.0)
MCH: 29.2 pg (ref 26.0–34.0)
MCHC: 34.8 g/dL (ref 30.0–36.0)

## 2012-02-16 LAB — COMPREHENSIVE METABOLIC PANEL
ALT: 18 U/L (ref 0–53)
AST: 9 U/L (ref 0–37)
Alkaline Phosphatase: 44 U/L (ref 39–117)
CO2: 23 mEq/L (ref 19–32)
Calcium: 8.7 mg/dL (ref 8.4–10.5)
GFR calc non Af Amer: >90 mL/min (ref >90)
Potassium: 3.6 mEq/L (ref 3.5–5.1)
Sodium: 137 mEq/L (ref 135–145)

## 2012-02-16 LAB — GLUCOSE, CAPILLARY
Glucose-Capillary: 177 mg/dL — ABNORMAL HIGH (ref 70–99)
Glucose-Capillary: 225 mg/dL — ABNORMAL HIGH (ref 70–99)
Glucose-Capillary: 243 mg/dL — ABNORMAL HIGH (ref 70–99)
Glucose-Capillary: 336 mg/dL — ABNORMAL HIGH (ref 70–99)
Glucose-Capillary: 354 mg/dL — ABNORMAL HIGH (ref 70–99)

## 2012-02-16 LAB — POCT I-STAT 3, ART BLOOD GAS (G3+)
Acid-base deficit: 3 mmol/L — ABNORMAL HIGH (ref 0.0–2.0)
Bicarbonate: 23 mEq/L (ref 20.0–24.0)
pCO2 arterial: 42.4 mmHg (ref 35.0–45.0)
pH, Arterial: 7.342 — ABNORMAL LOW (ref 7.350–7.450)
pO2, Arterial: 54 mmHg — ABNORMAL LOW (ref 80.0–100.0)

## 2012-02-16 MED ORDER — ONDANSETRON HCL 4 MG/2ML IJ SOLN
4.0000 mg | Freq: Four times a day (QID) | INTRAMUSCULAR | Status: DC | PRN
  Administered 2012-02-16: 4 mg via INTRAVENOUS
  Filled 2012-02-16: qty 2

## 2012-02-16 MED ORDER — SODIUM CHLORIDE 0.9 % IV SOLN
INTRAVENOUS | Status: DC
  Administered 2012-02-16: 04:00:00 via INTRAVENOUS

## 2012-02-16 MED ORDER — NITROGLYCERIN 0.4 MG/HR TD PT24
0.4000 mg | MEDICATED_PATCH | Freq: Every day | TRANSDERMAL | Status: DC
  Filled 2012-02-16: qty 1

## 2012-02-16 MED ORDER — GI COCKTAIL ~~LOC~~
30.0000 mL | Freq: Three times a day (TID) | ORAL | Status: DC | PRN
  Administered 2012-02-16: 30 mL via ORAL
  Filled 2012-02-16: qty 30

## 2012-02-16 MED ORDER — PANTOPRAZOLE SODIUM 40 MG PO TBEC
40.0000 mg | DELAYED_RELEASE_TABLET | Freq: Every day | ORAL | Status: DC
  Administered 2012-02-16 – 2012-02-18 (×3): 40 mg via ORAL
  Filled 2012-02-16 (×3): qty 1

## 2012-02-16 MED ORDER — NITROGLYCERIN IN D5W 200-5 MCG/ML-% IV SOLN
2.0000 ug/min | INTRAVENOUS | Status: DC
  Administered 2012-02-16: 5 ug/min via INTRAVENOUS
  Filled 2012-02-16: qty 250

## 2012-02-16 MED ORDER — ATORVASTATIN CALCIUM 40 MG PO TABS
40.0000 mg | ORAL_TABLET | Freq: Every day | ORAL | Status: DC
  Administered 2012-02-16 – 2012-02-17 (×2): 40 mg via ORAL
  Filled 2012-02-16 (×3): qty 1

## 2012-02-16 MED ORDER — ACETAMINOPHEN 650 MG RE SUPP: 650.0000 mg | Freq: Four times a day (QID) | RECTAL | Status: DC | PRN

## 2012-02-16 MED ORDER — HYDROCODONE-ACETAMINOPHEN 10-325 MG PO TABS
1.0000 | ORAL_TABLET | ORAL | Status: DC | PRN
  Administered 2012-02-16 – 2012-02-17 (×3): 1 via ORAL
  Filled 2012-02-16 (×3): qty 1

## 2012-02-16 MED ORDER — HEPARIN (PORCINE) IN NACL 100-0.45 UNIT/ML-% IJ SOLN
1400.0000 [IU]/h | INTRAMUSCULAR | Status: DC
  Administered 2012-02-16: 1400 [IU]/h via INTRAVENOUS
  Filled 2012-02-16: qty 250

## 2012-02-16 MED ORDER — GI COCKTAIL ~~LOC~~
30.0000 mL | Freq: Once | ORAL | Status: AC
  Administered 2012-02-16: 30 mL via ORAL
  Filled 2012-02-16: qty 30

## 2012-02-16 MED ORDER — ACETAMINOPHEN 325 MG PO TABS
650.0000 mg | ORAL_TABLET | Freq: Four times a day (QID) | ORAL | Status: DC | PRN
  Administered 2012-02-16 – 2012-02-17 (×4): 650 mg via ORAL
  Filled 2012-02-16 (×5): qty 2

## 2012-02-16 MED ORDER — HEPARIN BOLUS VIA INFUSION
4000.0000 [IU] | Freq: Once | INTRAVENOUS | Status: AC
  Administered 2012-02-16: 4000 [IU] via INTRAVENOUS
  Filled 2012-02-16: qty 4000

## 2012-02-16 MED ORDER — ASPIRIN EC 325 MG PO TBEC
325.0000 mg | DELAYED_RELEASE_TABLET | Freq: Every day | ORAL | Status: DC
  Administered 2012-02-16 – 2012-02-18 (×3): 325 mg via ORAL
  Filled 2012-02-16 (×3): qty 1

## 2012-02-16 MED ORDER — PNEUMOCOCCAL VAC POLYVALENT 25 MCG/0.5ML IJ INJ
0.5000 mL | INJECTION | INTRAMUSCULAR | Status: AC
  Administered 2012-02-17: 0.5 mL via INTRAMUSCULAR
  Filled 2012-02-16: qty 0.5

## 2012-02-16 MED ORDER — INSULIN GLARGINE 100 UNIT/ML ~~LOC~~ SOLN
20.0000 [IU] | Freq: Every day | SUBCUTANEOUS | Status: DC
  Administered 2012-02-16: 20 [IU] via SUBCUTANEOUS

## 2012-02-16 MED ORDER — ONDANSETRON HCL 4 MG PO TABS: 4.0000 mg | ORAL_TABLET | Freq: Four times a day (QID) | ORAL | Status: DC | PRN

## 2012-02-16 MED ORDER — IOHEXOL 350 MG/ML SOLN
100.0000 mL | Freq: Once | INTRAVENOUS | Status: AC | PRN
  Administered 2012-02-16: 100 mL via INTRAVENOUS

## 2012-02-16 MED ORDER — SODIUM CHLORIDE 0.9 % IJ SOLN
3.0000 mL | Freq: Two times a day (BID) | INTRAMUSCULAR | Status: DC
  Administered 2012-02-16 – 2012-02-18 (×4): 3 mL via INTRAVENOUS

## 2012-02-16 MED ORDER — INSULIN ASPART 100 UNIT/ML ~~LOC~~ SOLN
0.0000 [IU] | Freq: Three times a day (TID) | SUBCUTANEOUS | Status: DC
  Administered 2012-02-16: 3 [IU] via SUBCUTANEOUS
  Administered 2012-02-16: 5 [IU] via SUBCUTANEOUS
  Administered 2012-02-17: 11 [IU] via SUBCUTANEOUS

## 2012-02-16 MED ORDER — LIVING WELL WITH DIABETES BOOK
Freq: Once | Status: AC
  Administered 2012-02-16: 08:00:00
  Filled 2012-02-16: qty 1

## 2012-02-16 MED ORDER — LISINOPRIL 5 MG PO TABS
5.0000 mg | ORAL_TABLET | Freq: Every day | ORAL | Status: DC
  Administered 2012-02-16 – 2012-02-18 (×3): 5 mg via ORAL
  Filled 2012-02-16 (×3): qty 1

## 2012-02-16 MED ORDER — INSULIN ASPART 100 UNIT/ML ~~LOC~~ SOLN: 0.0000 [IU] | Freq: Every day | SUBCUTANEOUS | Status: DC

## 2012-02-16 MED ORDER — INSULIN ASPART 100 UNIT/ML ~~LOC~~ SOLN
3.0000 [IU] | Freq: Three times a day (TID) | SUBCUTANEOUS | Status: DC
  Administered 2012-02-16 – 2012-02-17 (×3): 3 [IU] via SUBCUTANEOUS

## 2012-02-16 MED ORDER — INSULIN ASPART 100 UNIT/ML ~~LOC~~ SOLN
0.0000 [IU] | Freq: Three times a day (TID) | SUBCUTANEOUS | Status: DC
  Administered 2012-02-16: 3 [IU] via SUBCUTANEOUS

## 2012-02-16 MED ORDER — ENOXAPARIN SODIUM 40 MG/0.4ML ~~LOC~~ SOLN
40.0000 mg | SUBCUTANEOUS | Status: DC
  Filled 2012-02-16: qty 0.4

## 2012-02-16 NOTE — Progress Notes (Signed)
Patient ID: [name], male   DOB: [birth year], 31 y.o.   MRN: [id number]    Subjective:  Stiff all over including chest  Objective:  Filed Vitals:   02/16/12 0027 02/16/12 0201 02/16/12 0600 02/16/12 0649  BP: 112/67 124/69 118/71 118/71  Pulse: 98 81 64   Temp:  97.7 F (36.5 C) 97.7 F (36.5 C)   TempSrc:      Resp: 18 16 16    Height:  6\' 1"  (1.854 m)    Weight:  111.585 kg (246 lb)    SpO2: 95% 97% 94%     Intake/Output from previous day:  Intake/Output Summary (Last 24 hours) at 02/16/12 0753 Last data filed at 02/16/12 0100  Gross per 24 hour  Intake      0 ml  Output    750 ml  Net   -750 ml    Physical Exam: Affect appropriate Healthy:  appears stated age HEENT: normal Neck supple with no adenopathy JVP normal no bruits no thyromegaly Lungs clear with no wheezing and good diaphragmatic motion Heart:  S1/S2 no murmur, no rub, gallop or click PMI normal Abdomen: benighn, BS positve, no tenderness, no AAA no bruit.  No HSM or HJR Distal pulses intact with no bruits No edema Neuro non-focal Skin warm and dry multiple tatoos on arms No muscular weakness   Lab Results: Basic Metabolic Panel:  Basename 02/16/12 0348 02/15/12 2212  NA 137 138  K 3.6 4.3  CL 106 103  CO2 23 24  GLUCOSE 319* 412*  BUN 11 13  CREATININE 0.60 0.63  CALCIUM 8.7 9.2  MG -- --  PHOS -- --   Liver Function Tests:  [facility] 02/16/12 0348 02/15/12 2212  AST 9 19  ALT 18 22  ALKPHOS 44 47  BILITOT 0.2* 0.2*  PROT 5.8* 6.6  ALBUMIN 3.3* 3.8    Basename 02/15/12 2212  LIPASE 29  AMYLASE --   CBC:  Basename 02/16/12 0348 02/15/12 2212  WBC 6.4 6.3  NEUTROABS -- 4.2  HGB 13.2 14.2  HCT 37.9* 39.6  MCV 83.8 83.0  PLT 223 226   Cardiac Enzymes:  Basename 02/16/12 0348 02/16/12 0025 02/15/12 2213  CKTOTAL 68 85 --  CKMB 2.3 3.0 --  CKMBINDEX -- -- --  TROPONINI <0.30 <0.30 <0.30    Imaging: Ct Head Wo Contrast  02/15/2012  *RADIOLOGY REPORT*  Clinical  Data: Confusion; headache.  CT HEAD WITHOUT CONTRAST  Technique:  Contiguous axial images were obtained from the base of the skull through the vertex without contrast.  Comparison: None.  Findings: There is no evidence of acute infarction, mass lesion, or intra- or extra-axial hemorrhage on CT.  The posterior fossa, including the cerebellum, brainstem and fourth ventricle, is within normal limits.  The third and lateral ventricles, and basal ganglia are unremarkable in appearance.  The cerebral hemispheres are symmetric in appearance, with normal gray- white differentiation.  No mass effect or midline shift is seen.  There is no evidence of fracture; visualized osseous structures are unremarkable in appearance.  The visualized portions of the orbits are within normal limits.  The paranasal sinuses and mastoid air cells are well-aerated.  No significant soft tissue abnormalities are seen.  IMPRESSION: Unremarkable noncontrast CT of the head.  Original Report Authenticated By: Tonia Ghent, M.D.   Ct Angio Chest W/cm &/or Wo Cm  02/16/2012  *RADIOLOGY REPORT*  Clinical Data: The chest pain.  CT ANGIOGRAPHY CHEST  Technique:  Multidetector CT imaging  of the chest using the standard protocol during bolus administration of intravenous contrast. Multiplanar reconstructed images including MIPs were obtained and reviewed to evaluate the vascular anatomy.  Contrast: OMNIPAQUE IOHEXOL 350 MG/ML SOLN  Comparison: None.  Findings: Precontrast imaging shows no evidence for a dense crescent to assist with the aortic wall to suggest acute intramural hematoma.  Imaging after IV contrast administration shows no evidence for dissection flap within the thoracic aorta.  Aberrant arch vessel anatomy is noted with the left vertebral artery arising directly from the aortic arch.  The ascending aorta measures 3.0 cm in diameter.  The patient is status post CABG.   Main pulmonary arteries are well opacified and there is no evidence  for any filling defects in the main, lobar, or segmental pulmonary arteries to suggest pulmonary embolus.  No subsegmental pulmonary arterial filling defect is identified, but these radicles may be less reliably evaluated.  No axillary, mediastinal, or hilar lymphadenopathy.  There is no pericardial or pleural effusion.  Cysts there is some trace subsegmental atelectasis in the anterior right upper lobe.  No pulmonary edema.  There is some compressive atelectasis in the dependent lung bases bilaterally.  No focal airspace consolidation.  No parenchymal nodule or mass.  Bone windows reveal no worrisome lytic or sclerotic osseous lesions.  IMPRESSION: No evidence for aortic aneurysm or dissection.   No evidence for large central pulmonary embolus.  Original Report Authenticated By: ERIC A. MANSELL, M.D.   Dg Chest Port 1 View  02/15/2012  *RADIOLOGY REPORT*  Clinical Data: Lethargy and chest burning.  PORTABLE CHEST - 1 VIEW  Comparison: Chest radiograph performed 02/22/2008  Findings: The lungs are hypoexpanded; minimal bibasilar atelectasis is noted.  There is no evidence of pleural effusion or pneumothorax.  The cardiomediastinal silhouette is borderline normal in size; the patient is status post median sternotomy, with evidence of prior CABG.  No acute osseous abnormalities are seen.  IMPRESSION: Lungs hypoexpanded, with minimal bibasilar atelectasis.  Original Report Authenticated By: Tonia Ghent, M.D.    Cardiac Studies:  ECG: Sinus tachycardia rate 100 no acute ischemic changes   Telemetry: NSR no arrhythmia   Medications:     . aspirin EC  325 mg Oral Daily  . atorvastatin  40 mg Oral q1800  . gi cocktail  30 mL Oral Once  . heparin  4,000 Units Intravenous Once  . insulin aspart  0-9 Units Subcutaneous TID WC  . lisinopril  5 mg Oral Daily  . living well with diabetes book   Does not apply Once  . pantoprazole  40 mg Oral Q1200  . sodium chloride  1,000 mL Intravenous Once  . sodium  chloride  3 mL Intravenous Q12H  . DISCONTD: enoxaparin  40 mg Subcutaneous Q24H  . DISCONTD: insulin (NOVOLIN-R) infusion   Intravenous Once  . DISCONTD: nitroGLYCERIN  0.4 mg Transdermal Daily       . sodium chloride 125 mL/hr at 02/16/12 0427  . heparin 1,400 Units/hr (02/16/12 0641)  . nitroGLYCERIN 5 mcg/min (02/16/12 0654)  . DISCONTD: sodium chloride    . DISCONTD: dextrose 5 % and 0.45% NaCl      Assessment/Plan:  Chest Pain:  Atypical previous CABG.  R/O  Exercise myovue in am  D/C nitro and heparin DM:  Continue insulin coverage Chol;  Statin  Charlton Haws 02/16/2012, 7:53 AM

## 2012-02-16 NOTE — ED Notes (Signed)
Patient transported to CT 

## 2012-02-16 NOTE — Progress Notes (Signed)
30 year old male with known history of CAD status post CABG, diabetes mellitus2, hyperlipidemia and hypertension was brought to the ER after patient had a spell of loss of consciousness.   Spoke with patient at length about his DM regimen at home.  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  Was started on oral diabetes medications.  Patient currently sees Dr. Eustaquio Boyden at [facility].  Patient told me he was put on insulin a while back (could not tell me how long ago) but started having low blood sugars at home and decided to take himself off insulin.  Not sure how long ago he quit taking insulin.  Patient also could not tell me what kind of insulin he was taking.  Spoke with patient about the extreme importance of maintaining good CBG control to prevent long-term and acute complications.  Reminded patient that since he has had a CABG, it is very important he get his CBGs under control to preserve the new vessels around his heart.  Reviewed patient's latest A1C with him (13.8%- 02/16/12).  Patient has been chronically out of control since 2009 as evidenced by his last few A1Cs:   Results for ADRIANE, GUGLIELMO (MRN [id number]) as of 02/16/2012 15:46  Ref. Range 12/05/2007 03:50 12/06/2007 03:36 11/19/2008 22:11 03/05/2009 12:12 12/28/2010 22:55 05/04/2011 14:07 02/16/2012 03:48  Hemoglobin A1C Latest Range: <5.7 % 11.8... (H) 11.8... (H) 12.2 (H) 12.0 (H) 12.4 (H) 13.0 (H) 13.8 (H)   Encouraged patient to follow-up with his primary care provider after d/c.  Discussed the possibility that he may likely go home on insulin after d/c from this hospital admission.  Patient seemed agreeable and open to trying insulin again.  Currently he does not have any prescription coverage through his Medicare.  Has been getting Metformin for $4 at [facility] and uses the inexpensive [employer] CBG meter with $9 strips.  Encouraged patient to check his CBGs at  least three times a day at home and to please record all his results for his primary care MD.  Reminded patient that the only way the MD can adjust his insulin is if he has CBG data to review.  Patient told me he plans to try to enroll in Medicare part D for Rx coverage in October of this year.  MD- Patient will not be able to afford Lantus and Novolog for d/c.  He does not currently have Rx coverage through Medicare.  If you send him home on insulin, please consider Humulin Reli-On 70/30 insulin bid.  This insulin can be purchased at [facility] for $25 per vial.  Will follow closely. Ambrose Finland RN, MSN, CDE Diabetes Coordinator Inpatient Diabetes Program [phone]

## 2012-02-16 NOTE — Progress Notes (Signed)
Utilization review completed.  

## 2012-02-16 NOTE — Progress Notes (Signed)
Pt called me to room complaining of 8/10 chest burning on the right side. VSS. MD notified and received order for GI cocktail. Pt started to complain of SOB and tingling in left neck, along with tingling in bilateral toes. EKG obtained with no major changes. Neuro check normal. Pt slow to respond and weak, but this is not new. CP increased to 10/10. 3 SL nitro administered. CP back to 8/10. Dr. David Stall at bedside. New orders received. Will continue to monitor. Duwaine Maxin, RN

## 2012-02-16 NOTE — Progress Notes (Signed)
[facility] PROGRESS NOTE  [name] ZOX:[id number] DOB: [birth year] DOA: 02/15/2012   Assessment/Plan: Patient Active Hospital Problem List: Chest pain (02/16/2012) -Atypical chest pain previous CABG. Agree with d/c heparin. -Exercise stress test in am. -cardia markers neg. -ECK sinus tach. -no events on telemetry  DIABETES MELLITUS, TYPE II (03/03/2009) -very high, d/c metformin, start Lantus and SSI  HYPERTENSION, UNSPECIFIED (03/03/2009) -controlled.  Syncope (02/16/2012)  no events on telemetry.  Code Status: full code Family Communication: Spouse [phone] Disposition Plan: home  Lambert Keto, MD  [facility] Pager [phone]  If 7PM-7AM, please contact night-coverage [email or web address] Password [facility] 02/16/2012, 10:46 AM   LOS: 1 day   Procedures:  Stress test   Subjective: No complains  Objective: Filed Vitals:   02/16/12 0201 02/16/12 0600 02/16/12 0649 02/16/12 0925  BP: 124/69 118/71 118/71 117/72  Pulse: 81 64    Temp: 97.7 F (36.5 C) 97.7 F (36.5 C)    TempSrc:      Resp: 16 16    Height: 6\' 1"  (1.854 m)     Weight: 111.585 kg (246 lb)     SpO2: 97% 94%      Intake/Output Summary (Last 24 hours) at 02/16/12 1046 Last data filed at 02/16/12 0100  Gross per 24 hour  Intake      0 ml  Output    750 ml  Net   -750 ml   Weight change:   Exam:  General: Alert, awake, oriented x3, in no acute distress.  HEENT: No bruits, no goiter.  Heart: Regular rate and rhythm, without murmurs, rubs, gallops.  Lungs: Good air movement, bilateral air movement.  Abdomen: Soft, nontender, nondistended, positive bowel sounds.  Neuro: Grossly intact, nonfocal.   Data Reviewed: Basic Metabolic Panel:  Lab 02/16/12 6578 02/15/12 2212  NA 137 138  K 3.6 4.3  CL 106 103  CO2 23 24  GLUCOSE 319* 412*  BUN 11 13  CREATININE 0.60 0.63  CALCIUM 8.7 9.2  MG -- --  PHOS -- --   Liver Function Tests:  Lab 02/16/12 0348  02/15/12 2212  AST 9 19  ALT 18 22  ALKPHOS 44 47  BILITOT 0.2* 0.2*  PROT 5.8* 6.6  ALBUMIN 3.3* 3.8    Lab 02/15/12 2212  LIPASE 29  AMYLASE --   No results found for this basename: AMMONIA:5 in the last 168 hours CBC:  Lab 02/16/12 0348 02/15/12 2212  WBC 6.4 6.3  NEUTROABS -- 4.2  HGB 13.2 14.2  HCT 37.9* 39.6  MCV 83.8 83.0  PLT 223 226   Cardiac Enzymes:  Lab 02/16/12 0348 02/16/12 0025 02/15/12 2213  CKTOTAL 68 85 --  CKMB 2.3 3.0 --  CKMBINDEX -- -- --  TROPONINI <0.30 <0.30 <0.30   BNP: No components found with this basename: POCBNP:5 CBG:  Lab 02/16/12 0725 02/16/12 0418 02/16/12 0150 02/16/12 0134 02/16/12 0026  GLUCAP 249* 287* 336* 225* 354*    No results found for this or any previous visit (from the past 240 hour(s)).   Studies: Ct Head Wo Contrast  02/15/2012  *RADIOLOGY REPORT*  Clinical Data: Confusion; headache.  CT HEAD WITHOUT CONTRAST  Technique:  Contiguous axial images were obtained from the base of the skull through the vertex without contrast.  Comparison: None.  Findings: There is no evidence of acute infarction, mass lesion, or intra- or extra-axial hemorrhage on CT.  The posterior fossa, including the cerebellum, brainstem and fourth ventricle, is within normal limits.  The third and lateral ventricles, and basal ganglia are unremarkable in appearance.  The cerebral hemispheres are symmetric in appearance, with normal gray- white differentiation.  No mass effect or midline shift is seen.  There is no evidence of fracture; visualized osseous structures are unremarkable in appearance.  The visualized portions of the orbits are within normal limits.  The paranasal sinuses and mastoid air cells are well-aerated.  No significant soft tissue abnormalities are seen.  IMPRESSION: Unremarkable noncontrast CT of the head.  Original Report Authenticated By: Tonia Ghent, M.D.   Ct Angio Chest W/cm &/or Wo Cm  02/16/2012  *RADIOLOGY REPORT*  Clinical  Data: The chest pain.  CT ANGIOGRAPHY CHEST  Technique:  Multidetector CT imaging of the chest using the standard protocol during bolus administration of intravenous contrast. Multiplanar reconstructed images including MIPs were obtained and reviewed to evaluate the vascular anatomy.  Contrast: OMNIPAQUE IOHEXOL 350 MG/ML SOLN  Comparison: None.  Findings: Precontrast imaging shows no evidence for a dense crescent to assist with the aortic wall to suggest acute intramural hematoma.  Imaging after IV contrast administration shows no evidence for dissection flap within the thoracic aorta.  Aberrant arch vessel anatomy is noted with the left vertebral artery arising directly from the aortic arch.  The ascending aorta measures 3.0 cm in diameter.  The patient is status post CABG.   Main pulmonary arteries are well opacified and there is no evidence for any filling defects in the main, lobar, or segmental pulmonary arteries to suggest pulmonary embolus.  No subsegmental pulmonary arterial filling defect is identified, but these radicles may be less reliably evaluated.  No axillary, mediastinal, or hilar lymphadenopathy.  There is no pericardial or pleural effusion.  Cysts there is some trace subsegmental atelectasis in the anterior right upper lobe.  No pulmonary edema.  There is some compressive atelectasis in the dependent lung bases bilaterally.  No focal airspace consolidation.  No parenchymal nodule or mass.  Bone windows reveal no worrisome lytic or sclerotic osseous lesions.  IMPRESSION: No evidence for aortic aneurysm or dissection.   No evidence for large central pulmonary embolus.  Original Report Authenticated By: ERIC A. MANSELL, M.D.   Dg Chest Port 1 View  02/15/2012  *RADIOLOGY REPORT*  Clinical Data: Lethargy and chest burning.  PORTABLE CHEST - 1 VIEW  Comparison: Chest radiograph performed 02/22/2008  Findings: The lungs are hypoexpanded; minimal bibasilar atelectasis is noted.  There is no  evidence of pleural effusion or pneumothorax.  The cardiomediastinal silhouette is borderline normal in size; the patient is status post median sternotomy, with evidence of prior CABG.  No acute osseous abnormalities are seen.  IMPRESSION: Lungs hypoexpanded, with minimal bibasilar atelectasis.  Original Report Authenticated By: Tonia Ghent, M.D.    Scheduled Meds:   . aspirin EC  325 mg Oral Daily  . atorvastatin  40 mg Oral q1800  . gi cocktail  30 mL Oral Once  . heparin  4,000 Units Intravenous Once  . insulin aspart  0-9 Units Subcutaneous TID WC  . lisinopril  5 mg Oral Daily  . living well with diabetes book   Does not apply Once  . pantoprazole  40 mg Oral Q1200  . sodium chloride  1,000 mL Intravenous Once  . sodium chloride  3 mL Intravenous Q12H  . DISCONTD: enoxaparin  40 mg Subcutaneous Q24H  . DISCONTD: insulin (NOVOLIN-R) infusion   Intravenous Once  . DISCONTD: nitroGLYCERIN  0.4 mg Transdermal Daily   Continuous Infusions:   .  sodium chloride 125 mL/hr at 02/16/12 0427  . DISCONTD: sodium chloride    . DISCONTD: dextrose 5 % and 0.45% NaCl    . DISCONTD: heparin 1,400 Units/hr (02/16/12 0641)  . DISCONTD: nitroGLYCERIN 5 mcg/min (02/16/12 0654)

## 2012-02-16 NOTE — Consult Note (Addendum)
Admit date: 02/15/2012 Referring Physician  Dr. Fredrich Romans Primary Physician  Dr. Danise Edge Primary Cardiologist  Dr. Excell Seltzer Reason for Consultation  Syncope and chest pain  HPI: This is a 31yo WM with a history of CAD s/p CABG at age 32, DM, dyslipidemia, HTN who was in his USOH until last evening when he presented to the ER with sycope and chest pain.  He was coaching baseball around 8PM and suddenly felt uncomfortable with nausea and thought he was going to vomit.  He went to the bathroom but did not vomit.  After he got back to the field he developed tingling in his finger tips in both hands and then developed a severe burning pressure in his chest.  He says that pressure was similar to what he had with his CAD in the past but the burning was new.  The pain radiated to his back.  He then had syncope.  He does not recall any dizziness or palpitations prior to the syncope.  In the ER he was initially confused but later became fully oriented.  He has some residual chest pressure with radiation into his arms and neck.  He denies any SOB, diaphoresis, abdominal pain.  He denied any incontinence or tongue biting.  Head CT in ER was negative for acute event and Chest CT negative for aortic dissection.  He was given a GI cocktail with resolution of the burning but the pressure discomfort.  Persists atf a 6/10 but he appears comfortable and was sleeping.     PMH:   Past Medical History  Diagnosis Date  . Coronary atherosclerosis of artery bypass graft   . Chest pain, unspecified   . Type II or unspecified type diabetes mellitus without mention of complication, not stated as uncontrolled 2002    poorly controlled  . HLD (hyperlipidemia)     poorly controlled  . HTN (hypertension)   . Obesity, unspecified   . Hx-TIA (transient ischemic attack) 2009    possible-([facility]) Normal MRI of brain and MRA of the head and neck     PSH:   Past Surgical History  Procedure Date  . Coronary artery bypass graft 2007     x 4 (age 61)    Allergies:  Review of patient's allergies indicates no known allergies. Prior to Admit Meds:   Prescriptions prior to admission  Medication Sig Dispense Refill  . aspirin EC 81 MG tablet Take 162 mg by mouth daily.      Marland Kitchen ibuprofen (ADVIL,MOTRIN) 200 MG tablet Take 200 mg by mouth every 6 (six) hours as needed. pain      . lisinopril (PRINIVIL,ZESTRIL) 5 MG tablet Take 1 tablet (5 mg total) by mouth daily.  30 tablet  6  . metFORMIN (GLUCOPHAGE) 500 MG tablet Take 1 tablet (500 mg total) by mouth 2 (two) times daily with a meal.  60 tablet  11  . rosuvastatin (CRESTOR) 20 MG tablet Take 20 mg by mouth daily.       Fam HX:    Family History  Problem Relation Age of Onset  . Emphysema Father     + smoker  . Coronary artery disease Maternal Grandfather   . Heart attack Maternal Grandfather   . Diabetes Maternal Grandfather   . Hypertension Maternal Grandfather   . Hyperlipidemia Maternal Grandfather   . Lung cancer      paternal uncles and aunts (all smokers)  . Coronary artery disease      premature-family history   Social  HX:    History   Social History  . Marital Status: Married    Spouse Name: N/A    Number of Children: 1  . Years of Education: N/A   Occupational History  . disability for heart condition    Social History Main Topics  . Smoking status: Former Smoker    Quit date: 10/11/2004  . Smokeless tobacco: Not on file   Comment: used to smoke 1 1/2 ppd  . Alcohol Use: No  . Drug Use: No  . Sexually Active: Not on file   Other Topics Concern  . Not on file   Social History Narrative   Caffeine: 1 cup coffee, 3/4 2L diet soda per dayLives with wife and son (8 y/o)Lives in [employer] disabilityRegular exercise-yes; coaches baseball, gets outside and walks daily, hunting     ROS:  All 11 ROS were addressed and are negative except what is stated in the HPI  Physical Exam: Blood pressure 124/69, pulse 81, temperature 97.7 F (36.5 C),  temperature source Oral, resp. rate 16, height 6\' 1"  (1.854 m), weight 111.585 kg (246 lb), SpO2 97.00%.    General: Well developed, well nourished, in no acute distress Head: Eyes PERRLA, No xanthomas.   Normal cephalic and atramatic  Lungs:   Clear bilaterally to auscultation and percussion. Heart:   HRRR S1 S2 Pulses are 2+ & equal.            No carotid bruit. No JVD.  No abdominal bruits. No femoral bruits. Abdomen: Bowel sounds are positive, abdomen soft and non-tender without masses  Extremities:   No clubbing, cyanosis or edema.  DP +1 Neuro: Alert and oriented X 3. Psych:  Good affect, responds appropriately    Labs:   Lab Results  Component Value Date   WBC 6.4 02/16/2012   HGB 13.2 02/16/2012   HCT 37.9* 02/16/2012   MCV 83.8 02/16/2012   PLT 223 02/16/2012    Lab 02/16/12 0348  NA 137  K 3.6  CL 106  CO2 23  BUN 11  CREATININE 0.60  CALCIUM 8.7  PROT 5.8*  BILITOT 0.2*  ALKPHOS 44  ALT 18  AST 9  GLUCOSE 319*   No results found for this basename: PTT   Lab Results  Component Value Date   INR 0.9 12/05/2007   INR 1.3 06/22/2007   INR 0.9 06/21/2007   Lab Results  Component Value Date   CKTOTAL 68 02/16/2012   CKMB 2.3 02/16/2012   TROPONINI <0.30 02/16/2012     Lab Results  Component Value Date   CHOL 133 12/28/2010   CHOL 159 09/15/2009   CHOL 140 06/03/2009   Lab Results  Component Value Date   HDL 43 12/28/2010   HDL 30* 09/15/2009   HDL 34* 06/03/2009   Lab Results  Component Value Date   LDLCALC 69 12/28/2010   LDLCALC 92 09/15/2009   LDLCALC 81 06/03/2009   Lab Results  Component Value Date   TRIG 106 12/28/2010   TRIG 186* 09/15/2009   TRIG 124 06/03/2009   Lab Results  Component Value Date   CHOLHDL 3.1 Ratio 12/28/2010   CHOLHDL 5.3 Ratio 09/15/2009   CHOLHDL 4.1 Ratio 06/03/2009   Lab Results  Component Value Date   LDLDIRECT 210.0 03/05/2009      Radiology:  Ct Head Wo Contrast  02/15/2012  *RADIOLOGY REPORT*  Clinical Data: Confusion;  headache.  CT HEAD WITHOUT CONTRAST  Technique:  Contiguous axial images were obtained  from the base of the skull through the vertex without contrast.  Comparison: None.  Findings: There is no evidence of acute infarction, mass lesion, or intra- or extra-axial hemorrhage on CT.  The posterior fossa, including the cerebellum, brainstem and fourth ventricle, is within normal limits.  The third and lateral ventricles, and basal ganglia are unremarkable in appearance.  The cerebral hemispheres are symmetric in appearance, with normal gray- white differentiation.  No mass effect or midline shift is seen.  There is no evidence of fracture; visualized osseous structures are unremarkable in appearance.  The visualized portions of the orbits are within normal limits.  The paranasal sinuses and mastoid air cells are well-aerated.  No significant soft tissue abnormalities are seen.  IMPRESSION: Unremarkable noncontrast CT of the head.  Original Report Authenticated By: Tonia Ghent, M.D.   Ct Angio Chest W/cm &/or Wo Cm  02/16/2012  *RADIOLOGY REPORT*  Clinical Data: The chest pain.  CT ANGIOGRAPHY CHEST  Technique:  Multidetector CT imaging of the chest using the standard protocol during bolus administration of intravenous contrast. Multiplanar reconstructed images including MIPs were obtained and reviewed to evaluate the vascular anatomy.  Contrast: OMNIPAQUE IOHEXOL 350 MG/ML SOLN  Comparison: None.  Findings: Precontrast imaging shows no evidence for a dense crescent to assist with the aortic wall to suggest acute intramural hematoma.  Imaging after IV contrast administration shows no evidence for dissection flap within the thoracic aorta.  Aberrant arch vessel anatomy is noted with the left vertebral artery arising directly from the aortic arch.  The ascending aorta measures 3.0 cm in diameter.  The patient is status post CABG.   Main pulmonary arteries are well opacified and there is no evidence for any filling  defects in the main, lobar, or segmental pulmonary arteries to suggest pulmonary embolus.  No subsegmental pulmonary arterial filling defect is identified, but these radicles may be less reliably evaluated.  No axillary, mediastinal, or hilar lymphadenopathy.  There is no pericardial or pleural effusion.  Cysts there is some trace subsegmental atelectasis in the anterior right upper lobe.  No pulmonary edema.  There is some compressive atelectasis in the dependent lung bases bilaterally.  No focal airspace consolidation.  No parenchymal nodule or mass.  Bone windows reveal no worrisome lytic or sclerotic osseous lesions.  IMPRESSION: No evidence for aortic aneurysm or dissection.   No evidence for large central pulmonary embolus.  Original Report Authenticated By: ERIC A. MANSELL, M.D.   Dg Chest Port 1 View  02/15/2012  *RADIOLOGY REPORT*  Clinical Data: Lethargy and chest burning.  PORTABLE CHEST - 1 VIEW  Comparison: Chest radiograph performed 02/22/2008  Findings: The lungs are hypoexpanded; minimal bibasilar atelectasis is noted.  There is no evidence of pleural effusion or pneumothorax.  The cardiomediastinal silhouette is borderline normal in size; the patient is status post median sternotomy, with evidence of prior CABG.  No acute osseous abnormalities are seen.  IMPRESSION: Lungs hypoexpanded, with minimal bibasilar atelectasis.  Original Report Authenticated By: Tonia Ghent, M.D.    EKG:  NSR with no ST changes  ASSESSMENT:  1.  Syncope possibly secondary to vasovagal event 2.  Atypical chest pain - 2 components - the burning has resolved with a GI cocktail but the pressure persists.  He appears very comfortable but says the pressure is a 6/10.  His EKG is nonischemic and cardiac enzymes thus far are negatve. 3.  CAD s/p remote CABG 4.  DM 5.  Dyslipidemia  PLAN:  1.  IV Heparin gtt per pharmacy until rule out complete 2.  Continue home meds 3.  Continue to cycle cardiac enzymes 4.   NPO 5.  D/C Lovenox  6.  D/C NTG patch and start IV NTG gtt 7.  Further workup per Dr. Felicie Morn, MD  02/16/2012  5:24 AM

## 2012-02-16 NOTE — H&P (Signed)
[name] is an 31 y.o. male.   PCP - Dr.Javier Danise Edge.  Chief Complaint: Chest pain. HPI: 31 year old male with known history of CAD status post CABG, diabetes mellitus2, hyperlipidemia and hypertension was brought to the ER after patient had a spell of loss of consciousness. 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 In the ER patient was initially confused but presently he is completely oriented. He still has some chest pressure that is radiating to his neck. Denies any shortness of breath, abdominal pain, incontinence of urine, tongue bite or any focal deficits. Cardiac enzymes and CT head was negative EKG does not show any acute. Patient at this time is admitted for further management and observation.   Past Medical History  Diagnosis Date  . Coronary atherosclerosis of artery bypass graft   . Chest pain, unspecified   . Type II or unspecified type diabetes mellitus without mention of complication, not stated as uncontrolled 2002    poorly controlled  . HLD (hyperlipidemia)     poorly controlled  . HTN (hypertension)   . Obesity, unspecified   . Hx-TIA (transient ischemic attack) 2009    possible-([facility]) Normal MRI of brain and MRA of the head and neck    Past Surgical History  Procedure Date  . Coronary artery bypass graft 2007    x 4 (age 20)    Family History  Problem Relation Age of Onset  . Emphysema Father     + smoker  . Coronary artery disease Maternal Grandfather   . Heart attack Maternal Grandfather   . Diabetes Maternal Grandfather   . Hypertension Maternal Grandfather   . Hyperlipidemia Maternal Grandfather   . Lung cancer      paternal uncles and aunts (all smokers)  . Coronary artery disease      premature-family history    Social History:  reports that he quit smoking about 7 years ago. He does not have any smokeless tobacco history on file. He reports that he does not drink alcohol or use illicit drugs.  Allergies: No Known Allergies  Medications Prior to Admission  Medication Sig Dispense Refill  . aspirin EC 81 MG tablet Take 162 mg by mouth daily.      Marland Kitchen ibuprofen (ADVIL,MOTRIN) 200 MG tablet Take 200 mg by mouth every 6 (six) hours as needed. pain      . lisinopril (PRINIVIL,ZESTRIL) 5 MG tablet Take 1 tablet (5 mg total) by mouth daily.  30 tablet  6  . metFORMIN (GLUCOPHAGE) 500 MG tablet Take 1 tablet (500 mg total) by mouth 2 (two) times daily with a meal.  60 tablet  11  . rosuvastatin (CRESTOR) 20 MG tablet Take 20 mg by mouth daily.        Results for orders placed during the hospital encounter of 02/15/12 (from the past 48 hour(s))  CBC     Status: Normal   Collection Time   02/15/12 10:12 PM      Component Value Range Comment   WBC 6.3  4.0 - 10.5 (K/uL)    RBC 4.77  4.22 - 5.81 (MIL/uL)    Hemoglobin 14.2  13.0 - 17.0 (g/dL)    HCT 96.2  95.2 - 84.1 (%)    MCV  83.0  78.0 - 100.0 (fL)    MCH 29.8  26.0 - 34.0 (pg)    MCHC 35.9  30.0 - 36.0 (g/dL)    RDW 16.1  09.6 - 04.5 (%)    Platelets 226  150 - 400 (K/uL)   DIFFERENTIAL     Status: Normal   Collection Time   02/15/12 10:12 PM      Component Value Range Comment   Neutrophils Relative 66  43 - 77 (%)    Neutro Abs 4.2  1.7 - 7.7 (K/uL)    Lymphocytes Relative 28  12 - 46 (%)    Lymphs Abs 1.7  0.7 - 4.0 (K/uL)    Monocytes Relative 6  3 - 12 (%)    Monocytes Absolute 0.4  0.1 - 1.0 (K/uL)    Eosinophils Relative 0  0 - 5 (%)    Eosinophils Absolute 0.0  0.0 - 0.7 (K/uL)    Basophils Relative 0  0 - 1 (%)    Basophils Absolute 0.0  0.0 - 0.1 (K/uL)   COMPREHENSIVE METABOLIC PANEL     Status: Abnormal   Collection Time   02/15/12 10:12 PM      Component Value Range Comment   Sodium 138  135 - 145 (mEq/L)    Potassium 4.3  3.5 -  5.1 (mEq/L) HEMOLYSIS AT THIS LEVEL MAY AFFECT RESULT   Chloride 103  96 - 112 (mEq/L)    CO2 24  19 - 32 (mEq/L)    Glucose, Bld 412 (*) 70 - 99 (mg/dL)    BUN 13  6 - 23 (mg/dL)    Creatinine, Ser 4.09  0.50 - 1.35 (mg/dL)    Calcium 9.2  8.4 - 10.5 (mg/dL)    Total Protein 6.6  6.0 - 8.3 (g/dL)    Albumin 3.8  3.5 - 5.2 (g/dL)    AST 19  0 - 37 (U/L) HEMOLYSIS AT THIS LEVEL MAY AFFECT RESULT   ALT 22  0 - 53 (U/L)    Alkaline Phosphatase 47  39 - 117 (U/L)    Total Bilirubin 0.2 (*) 0.3 - 1.2 (mg/dL)    GFR calc non Af Amer >90  >90 (mL/min)    GFR calc Af Amer >90  >90 (mL/min)   LIPASE, BLOOD     Status: Normal   Collection Time   02/15/12 10:12 PM      Component Value Range Comment   Lipase 29  11 - 59 (U/L)   TROPONIN I     Status: Normal   Collection Time   02/15/12 10:13 PM      Component Value Range Comment   Troponin I <0.30  <0.30 (ng/mL)   CARDIAC PANEL(CRET KIN+CKTOT+MB+TROPI)     Status: Normal   Collection Time   02/16/12 12:25 AM      Component Value Range Comment   Total CK 85  7 - 232 (U/L)    CK, MB 3.0  0.3 - 4.0 (ng/mL)    Troponin I <0.30  <0.30 (ng/mL)    Relative Index RELATIVE INDEX IS INVALID  0.0 - 2.5    GLUCOSE, CAPILLARY     Status: Abnormal   Collection Time   02/16/12 12:26 AM      Component Value Range Comment   Glucose-Capillary 354 (*) 70 - 99 (mg/dL)   POCT I-STAT 3, BLOOD GAS (G3+)     Status: Abnormal   Collection Time   02/16/12  1:31 AM  Component Value Range Comment   pH, Arterial 7.342 (*) 7.350 - 7.450     pCO2 arterial 42.4  35.0 - 45.0 (mmHg)    pO2, Arterial 54.0 (*) 80.0 - 100.0 (mmHg)    Bicarbonate 23.0  20.0 - 24.0 (mEq/L)    TCO2 24  0 - 100 (mmol/L)    O2 Saturation 86.0      Acid-base deficit 3.0 (*) 0.0 - 2.0 (mmol/L)    Collection site FEMORAL ARTERY      Sample type ARTERIAL     GLUCOSE, CAPILLARY     Status: Abnormal   Collection Time   02/16/12  1:34 AM      Component Value Range Comment   Glucose-Capillary 225  (*) 70 - 99 (mg/dL)   GLUCOSE, CAPILLARY     Status: Abnormal   Collection Time   02/16/12  1:50 AM      Component Value Range Comment   Glucose-Capillary 336 (*) 70 - 99 (mg/dL)    Ct Head Wo Contrast  02/15/2012  *RADIOLOGY REPORT*  Clinical Data: Confusion; headache.  CT HEAD WITHOUT CONTRAST  Technique:  Contiguous axial images were obtained from the base of the skull through the vertex without contrast.  Comparison: None.  Findings: There is no evidence of acute infarction, mass lesion, or intra- or extra-axial hemorrhage on CT.  The posterior fossa, including the cerebellum, brainstem and fourth ventricle, is within normal limits.  The third and lateral ventricles, and basal ganglia are unremarkable in appearance.  The cerebral hemispheres are symmetric in appearance, with normal gray- white differentiation.  No mass effect or midline shift is seen.  There is no evidence of fracture; visualized osseous structures are unremarkable in appearance.  The visualized portions of the orbits are within normal limits.  The paranasal sinuses and mastoid air cells are well-aerated.  No significant soft tissue abnormalities are seen.  IMPRESSION: Unremarkable noncontrast CT of the head.  Original Report Authenticated By: Tonia Ghent, M.D.   Ct Angio Chest W/cm &/or Wo Cm  02/16/2012  *RADIOLOGY REPORT*  Clinical Data: The chest pain.  CT ANGIOGRAPHY CHEST  Technique:  Multidetector CT imaging of the chest using the standard protocol during bolus administration of intravenous contrast. Multiplanar reconstructed images including MIPs were obtained and reviewed to evaluate the vascular anatomy.  Contrast: OMNIPAQUE IOHEXOL 350 MG/ML SOLN  Comparison: None.  Findings: Precontrast imaging shows no evidence for a dense crescent to assist with the aortic wall to suggest acute intramural hematoma.  Imaging after IV contrast administration shows no evidence for dissection flap within the thoracic aorta.  Aberrant  arch vessel anatomy is noted with the left vertebral artery arising directly from the aortic arch.  The ascending aorta measures 3.0 cm in diameter.  The patient is status post CABG.   Main pulmonary arteries are well opacified and there is no evidence for any filling defects in the main, lobar, or segmental pulmonary arteries to suggest pulmonary embolus.  No subsegmental pulmonary arterial filling defect is identified, but these radicles may be less reliably evaluated.  No axillary, mediastinal, or hilar lymphadenopathy.  There is no pericardial or pleural effusion.  Cysts there is some trace subsegmental atelectasis in the anterior right upper lobe.  No pulmonary edema.  There is some compressive atelectasis in the dependent lung bases bilaterally.  No focal airspace consolidation.  No parenchymal nodule or mass.  Bone windows reveal no worrisome lytic or sclerotic osseous lesions.  IMPRESSION: No evidence for aortic  aneurysm or dissection.   No evidence for large central pulmonary embolus.  Original Report Authenticated By: ERIC A. MANSELL, M.D.   Dg Chest Port 1 View  02/15/2012  *RADIOLOGY REPORT*  Clinical Data: Lethargy and chest burning.  PORTABLE CHEST - 1 VIEW  Comparison: Chest radiograph performed 02/22/2008  Findings: The lungs are hypoexpanded; minimal bibasilar atelectasis is noted.  There is no evidence of pleural effusion or pneumothorax.  The cardiomediastinal silhouette is borderline normal in size; the patient is status post median sternotomy, with evidence of prior CABG.  No acute osseous abnormalities are seen.  IMPRESSION: Lungs hypoexpanded, with minimal bibasilar atelectasis.  Original Report Authenticated By: Tonia Ghent, M.D.    Review of Systems  Constitutional: Negative.   HENT: Negative.   Eyes: Negative.   Respiratory: Negative.   Cardiovascular: Positive for chest pain.  Gastrointestinal: Negative.   Genitourinary: Negative.   Musculoskeletal: Negative.   Skin:  Negative.   Neurological: Positive for loss of consciousness.  Endo/Heme/Allergies: Negative.   Psychiatric/Behavioral: Negative.     Blood pressure 124/69, pulse 81, temperature 97.7 F (36.5 C), temperature source Oral, resp. rate 16, SpO2 97.00%. Physical Exam  Constitutional: He is oriented to person, place, and time. He appears well-developed and well-nourished. No distress.  HENT:  Head: Normocephalic and atraumatic.  Right Ear: External ear normal.  Left Ear: External ear normal.  Nose: Nose normal.  Mouth/Throat: Oropharynx is clear and moist. No oropharyngeal exudate.  Eyes: Conjunctivae are normal. Pupils are equal, round, and reactive to light.  Neck: Normal range of motion. Neck supple.  Cardiovascular: Normal rate and regular rhythm.   Respiratory: Effort normal and breath sounds normal. No respiratory distress. He has no wheezes. He has no rales.  GI: Soft. Bowel sounds are normal. He exhibits no distension. There is no tenderness. There is no rebound.  Musculoskeletal: Normal range of motion. He exhibits no edema and no tenderness.  Neurological: He is alert and oriented to person, place, and time. No cranial nerve deficit.       Moves all extremities.  Skin: Skin is warm and dry. No rash noted. He is not diaphoretic. No erythema.  Psychiatric: His behavior is normal.     Assessment/Plan #1. Chest pain history of CAD status post CABG to rule out ACS  - patient still has chest pressure and I did discuss with on-call cardiologist Dr. Carolanne Grumbling. Dr. Carolanne Grumbling at this time advised to give a GI cocktail. We will continue to cycle cardiac markers. Keep patient n.p.o. except medications in anticipation of a possible cardiac procedure. #2. Syncope  - could be vasovagal but given his cardiac history we'll have to rule out significant arrhythmias. #3. Uncontrolled diabetes mellitus 2 - will keep patient on sliding scale coverage for now with hydration and check hemoglobin  A1c. If patient's blood sugar remains high may have to add long-acting insulin while in the hospital. #4. History of hypertension hyperlipidemia  - continue present medications.   CODE STATUS - full code.  Dean Goldner N. 02/16/2012, 3:16 AM

## 2012-02-16 NOTE — ED Notes (Signed)
RT at bedside to collect blood gas. 

## 2012-02-16 NOTE — Progress Notes (Signed)
ANTICOAGULATION CONSULT NOTE - Initial Consult  Pharmacy Consult for Heparin Indication: chest pain/ACS  No Known Allergies  Patient Measurements: Height: 6\' 1"  (185.4 cm) Weight: 246 lb (111.585 kg) IBW/kg (Calculated) : 79.9  Heparin Dosing Weight: 100  Vital Signs: Temp: 97.7 F (36.5 C) (05/08 0201) Temp src: Oral (05/07 2201) BP: 124/69 mmHg (05/08 0201) Pulse Rate: 81  (05/08 0201)  Labs:  Basename 02/16/12 0348 02/16/12 0025 02/15/12 2213 02/15/12 2212  HGB 13.2 -- -- 14.2  HCT 37.9* -- -- 39.6  PLT 223 -- -- 226  APTT -- -- -- --  LABPROT -- -- -- --  INR -- -- -- --  HEPARINUNFRC -- -- -- --  CREATININE 0.60 -- -- 0.63  CKTOTAL 68 85 -- --  CKMB 2.3 3.0 -- --  TROPONINI <0.30 <0.30 <0.30 --    Estimated Creatinine Clearance: 173.6 ml/min (by C-G formula based on Cr of 0.6).   Medical History: Past Medical History  Diagnosis Date  . Coronary atherosclerosis of artery bypass graft   . Chest pain, unspecified   . Type II or unspecified type diabetes mellitus without mention of complication, not stated as uncontrolled 2002    poorly controlled  . HLD (hyperlipidemia)     poorly controlled  . HTN (hypertension)   . Obesity, unspecified   . Hx-TIA (transient ischemic attack) 2009    possible-([facility]) Normal MRI of brain and MRA of the head and neck    Medications:  Prescriptions prior to admission  Medication Sig Dispense Refill  . aspirin EC 81 MG tablet Take 162 mg by mouth daily.      Marland Kitchen ibuprofen (ADVIL,MOTRIN) 200 MG tablet Take 200 mg by mouth every 6 (six) hours as needed. pain      . lisinopril (PRINIVIL,ZESTRIL) 5 MG tablet Take 1 tablet (5 mg total) by mouth daily.  30 tablet  6  . metFORMIN (GLUCOPHAGE) 500 MG tablet Take 1 tablet (500 mg total) by mouth 2 (two) times daily with a meal.  60 tablet  11  . rosuvastatin (CRESTOR) 20 MG tablet Take 20 mg by mouth daily.        Assessment: 31 yo male with chest pain for heparin  Goal of  Therapy:  Heparin 0.3-0.7 Monitor platelets by anticoagulation protocol: Yes   Plan:  Heparin 4000 units V bolus, then 1400 units/hr Check heparin level in 6 hours.   Eddie Candle 02/16/2012,5:45 AM

## 2012-02-16 NOTE — ED Notes (Signed)
PT returned from CT

## 2012-02-17 ENCOUNTER — Inpatient Hospital Stay (HOSPITAL_COMMUNITY): Payer: Medicare Other

## 2012-02-17 DIAGNOSIS — E1165 Type 2 diabetes mellitus with hyperglycemia: Secondary | ICD-10-CM

## 2012-02-17 DIAGNOSIS — I10 Essential (primary) hypertension: Secondary | ICD-10-CM

## 2012-02-17 DIAGNOSIS — R55 Syncope and collapse: Secondary | ICD-10-CM

## 2012-02-17 DIAGNOSIS — R072 Precordial pain: Secondary | ICD-10-CM

## 2012-02-17 DIAGNOSIS — R079 Chest pain, unspecified: Secondary | ICD-10-CM

## 2012-02-17 LAB — GLUCOSE, CAPILLARY
Glucose-Capillary: 333 mg/dL — ABNORMAL HIGH (ref 70–99)
Glucose-Capillary: 136 mg/dL — ABNORMAL HIGH (ref 70–99)

## 2012-02-17 MED ORDER — TECHNETIUM TC 99M TETROFOSMIN IV KIT
30.0000 | PACK | Freq: Once | INTRAVENOUS | Status: AC | PRN
  Administered 2012-02-17: 30 via INTRAVENOUS

## 2012-02-17 MED ORDER — INSULIN ASPART PROT & ASPART (70-30 MIX) 100 UNIT/ML ~~LOC~~ SUSP
10.0000 [IU] | Freq: Every day | SUBCUTANEOUS | Status: DC
  Administered 2012-02-17: 10 [IU] via SUBCUTANEOUS
  Filled 2012-02-17: qty 3

## 2012-02-17 MED ORDER — INSULIN ASPART PROT & ASPART (70-30 MIX) 100 UNIT/ML ~~LOC~~ SUSP
20.0000 [IU] | Freq: Every day | SUBCUTANEOUS | Status: DC
  Administered 2012-02-18: 20 [IU] via SUBCUTANEOUS
  Filled 2012-02-17: qty 3

## 2012-02-17 MED ORDER — TECHNETIUM TC 99M TETROFOSMIN IV KIT
10.0000 | PACK | Freq: Once | INTRAVENOUS | Status: AC | PRN
  Administered 2012-02-17: 10 via INTRAVENOUS

## 2012-02-17 NOTE — Progress Notes (Signed)
Underwent stress test today.  Poorly controlled diabetes at home.  A1C 13.8% (02/16/12).  Had lengthy conversation with patient yesterday about the importance of good CBG control (see my note from 02/16/12).    MD- Patient will not be able to afford Lantus and Novolog for d/c. He does not currently have Rx coverage through Medicare. If you send him home on insulin, please consider Humulin Reli-On 70/30 insulin bid. This insulin can be purchased at [facility] for $25 per vial.  Patient has CBG meter at home.  Gets CBG meter strips at [facility] for $9 a box.  Encouraged patient yesterday to check his CBGs at least tid at home.  Will follow. Ambrose Finland RN, MSN, CDE Diabetes Coordinator [facility]patient [facility] [phone]

## 2012-02-17 NOTE — Progress Notes (Signed)
Pt to nuclear medicine for exercise cardiac stress test.  T. Argueile, PA at bedside for start and throughout exam.  Stress started at 0903, completed at 0913 with recovery completed at 0919.  Pt tolerated exam without adverse effects.

## 2012-02-17 NOTE — Progress Notes (Signed)
Patient ID: [name], male   DOB: 1981/02/18, 31 y.o.   MRN: [id number]    Subjective:  Examined in nuclear prior to stress myovue.  No pain this am  Objective:  Filed Vitals:   02/16/12 1300 02/16/12 1330 02/16/12 2100 02/17/12 0500  BP: 120/83 123/83 118/72 111/67  Pulse: 91 86 71 63  Temp:  97.9 F (36.6 C) 97.8 F (36.6 C) 97.6 F (36.4 C)  TempSrc:   Oral Oral  Resp:  18 18 18   Height:      Weight:    112.628 kg (248 lb 4.8 oz)  SpO2:  95% 99% 95%    Intake/Output from previous day:  Intake/Output Summary (Last 24 hours) at 02/17/12 0931 Last data filed at 02/16/12 2100  Gross per 24 hour  Intake   1280 ml  Output   1900 ml  Net   -620 ml    Physical Exam: Affect appropriate Healthy:  appears stated age HEENT: normal Neck supple with no adenopathy JVP normal no bruits no thyromegaly Lungs clear with no wheezing and good diaphragmatic motion Heart:  S1/S2 no murmur, no rub, gallop or click PMI normal Abdomen: benighn, BS positve, no tenderness, no AAA no bruit.  No HSM or HJR Distal pulses intact with no bruits No edema Neuro non-focal Skin warm and dry multiple tatoos on arms No muscular weakness   Lab Results: Basic Metabolic Panel:  Basename 02/16/12 0348 02/15/12 2212  NA 137 138  K 3.6 4.3  CL 106 103  CO2 23 24  GLUCOSE 319* 412*  BUN 11 13  CREATININE 0.60 0.63  CALCIUM 8.7 9.2  MG -- --  PHOS -- --   Liver Function Tests:  [facility] 02/16/12 0348 02/15/12 2212  AST 9 19  ALT 18 22  ALKPHOS 44 47  BILITOT 0.2* 0.2*  PROT 5.8* 6.6  ALBUMIN 3.3* 3.8    Basename 02/15/12 2212  LIPASE 29  AMYLASE --   CBC:  Basename 02/16/12 0348 02/15/12 2212  WBC 6.4 6.3  NEUTROABS -- 4.2  HGB 13.2 14.2  HCT 37.9* 39.6  MCV 83.8 83.0  PLT 223 226   Cardiac Enzymes:  Basename 02/16/12 2016 02/16/12 1216 02/16/12 0348  CKTOTAL 59 63 68  CKMB 2.2 2.8 2.3  CKMBINDEX -- -- --  TROPONINI <0.30 <0.30 <0.30    Imaging: Ct Head  Wo Contrast  02/15/2012  *RADIOLOGY REPORT*  Clinical Data: Confusion; headache.  CT HEAD WITHOUT CONTRAST  Technique:  Contiguous axial images were obtained from the base of the skull through the vertex without contrast.  Comparison: None.  Findings: There is no evidence of acute infarction, mass lesion, or intra- or extra-axial hemorrhage on CT.  The posterior fossa, including the cerebellum, brainstem and fourth ventricle, is within normal limits.  The third and lateral ventricles, and basal ganglia are unremarkable in appearance.  The cerebral hemispheres are symmetric in appearance, with normal gray- white differentiation.  No mass effect or midline shift is seen.  There is no evidence of fracture; visualized osseous structures are unremarkable in appearance.  The visualized portions of the orbits are within normal limits.  The paranasal sinuses and mastoid air cells are well-aerated.  No significant soft tissue abnormalities are seen.  IMPRESSION: Unremarkable noncontrast CT of the head.  Original Report Authenticated By: Tonia Ghent, M.D.   Ct Angio Chest W/cm &/or Wo Cm  02/16/2012  *RADIOLOGY REPORT*  Clinical Data: The chest pain.  CT ANGIOGRAPHY CHEST  Technique:  Multidetector CT imaging of the chest using the standard protocol during bolus administration of intravenous contrast. Multiplanar reconstructed images including MIPs were obtained and reviewed to evaluate the vascular anatomy.  Contrast: OMNIPAQUE IOHEXOL 350 MG/ML SOLN  Comparison: None.  Findings: Precontrast imaging shows no evidence for a dense crescent to assist with the aortic wall to suggest acute intramural hematoma.  Imaging after IV contrast administration shows no evidence for dissection flap within the thoracic aorta.  Aberrant arch vessel anatomy is noted with the left vertebral artery arising directly from the aortic arch.  The ascending aorta measures 3.0 cm in diameter.  The patient is status post CABG.   Main  pulmonary arteries are well opacified and there is no evidence for any filling defects in the main, lobar, or segmental pulmonary arteries to suggest pulmonary embolus.  No subsegmental pulmonary arterial filling defect is identified, but these radicles may be less reliably evaluated.  No axillary, mediastinal, or hilar lymphadenopathy.  There is no pericardial or pleural effusion.  Cysts there is some trace subsegmental atelectasis in the anterior right upper lobe.  No pulmonary edema.  There is some compressive atelectasis in the dependent lung bases bilaterally.  No focal airspace consolidation.  No parenchymal nodule or mass.  Bone windows reveal no worrisome lytic or sclerotic osseous lesions.  IMPRESSION: No evidence for aortic aneurysm or dissection.   No evidence for large central pulmonary embolus.  Original Report Authenticated By: ERIC A. MANSELL, M.D.   Dg Chest Port 1 View  02/15/2012  *RADIOLOGY REPORT*  Clinical Data: Lethargy and chest burning.  PORTABLE CHEST - 1 VIEW  Comparison: Chest radiograph performed 02/22/2008  Findings: The lungs are hypoexpanded; minimal bibasilar atelectasis is noted.  There is no evidence of pleural effusion or pneumothorax.  The cardiomediastinal silhouette is borderline normal in size; the patient is status post median sternotomy, with evidence of prior CABG.  No acute osseous abnormalities are seen.  IMPRESSION: Lungs hypoexpanded, with minimal bibasilar atelectasis.  Original Report Authenticated By: Tonia Ghent, M.D.    Cardiac Studies:  ECG: Sinus tachycardia rate 100 no acute ischemic changes   Telemetry: NSR no arrhythmia   Medications:      . aspirin EC  325 mg Oral Daily  . atorvastatin  40 mg Oral q1800  . insulin aspart  0-15 Units Subcutaneous TID WC  . insulin aspart  0-5 Units Subcutaneous QHS  . insulin aspart  3 Units Subcutaneous TID WC  . insulin glargine  20 Units Subcutaneous QHS  . lisinopril  5 mg Oral Daily  . pantoprazole   40 mg Oral Q1200  . pneumococcal 23 valent vaccine  0.5 mL Intramuscular Tomorrow-1000  . sodium chloride  3 mL Intravenous Q12H  . DISCONTD: insulin aspart  0-9 Units Subcutaneous TID WC        . sodium chloride 10 mL/hr at 02/16/12 1323    Assessment/Plan:  Chest Pain:  Atypical previous CABG.  R/O Heparin and nitro D/C  D/C home today if myovue low risk DM:  Continue insulin coverage Chol;  Statin  Charlton Haws 02/17/2012, 9:31 AM

## 2012-02-17 NOTE — Progress Notes (Signed)
[facility] PROGRESS NOTE  CAINEN BURNHAM WUJ:[id number] DOB: 1981-05-18 DOA: 02/15/2012   Assessment/Plan: Patient Active Hospital Problem List: Chest pain (02/16/2012) -Atypical chest pain previous CABG. -Exercise stress test today. -ECK sinus tach. -no events on telemetry  DIABETES MELLITUS, TYPE II (03/03/2009) -very high, d/c metformin, increase lantusLantus and SSI  HYPERTENSION, UNSPECIFIED (03/03/2009) -controlled.  Syncope (02/16/2012) - no events on telemetry.   Code Status: full code Family Communication: Spouse [phone] Disposition Plan: home  Lambert Keto, MD  [facility] Pager [phone]  If 7PM-7AM, please contact night-coverage [email or web address] Password [facility] 02/17/2012, 7:36 AM   LOS: 2 days   Procedures:  Stress test   Subjective: No complains -chest pain overnight, not tachycardic on telemtry  Objective: Filed Vitals:   02/16/12 1300 02/16/12 1330 02/16/12 2100 02/17/12 0500  BP: 120/83 123/83 118/72 111/67  Pulse: 91 86 71 63  Temp:  97.9 F (36.6 C) 97.8 F (36.6 C) 97.6 F (36.4 C)  TempSrc:   Oral Oral  Resp:  18 18 18   Height:      Weight:    112.628 kg (248 lb 4.8 oz)  SpO2:  95% 99% 95%    Intake/Output Summary (Last 24 hours) at 02/17/12 0736 Last data filed at 02/16/12 2100  Gross per 24 hour  Intake   1280 ml  Output   1900 ml  Net   -620 ml   Weight change: 1.043 kg (2 lb 4.8 oz)  Exam:  General: Alert, awake, oriented x3, in no acute distress.  HEENT: No bruits, no goiter.  Heart: Regular rate and rhythm, without murmurs, rubs, gallops.  Lungs: Good air movement, bilateral air movement.  Abdomen: Soft, nontender, nondistended, positive bowel sounds.  Neuro: Grossly intact, nonfocal.   Data Reviewed: Basic Metabolic Panel:  Lab 02/16/12 9528 02/15/12 2212  NA 137 138  K 3.6 4.3  CL 106 103  CO2 23 24  GLUCOSE 319* 412*  BUN 11 13  CREATININE 0.60 0.63  CALCIUM 8.7 9.2  MG --  --  PHOS -- --   Liver Function Tests:  Lab 02/16/12 0348 02/15/12 2212  AST 9 19  ALT 18 22  ALKPHOS 44 47  BILITOT 0.2* 0.2*  PROT 5.8* 6.6  ALBUMIN 3.3* 3.8    Lab 02/15/12 2212  LIPASE 29  AMYLASE --   No results found for this basename: AMMONIA:5 in the last 168 hours CBC:  Lab 02/16/12 0348 02/15/12 2212  WBC 6.4 6.3  NEUTROABS -- 4.2  HGB 13.2 14.2  HCT 37.9* 39.6  MCV 83.8 83.0  PLT 223 226   Cardiac Enzymes:  Lab 02/16/12 2016 02/16/12 1216 02/16/12 0348 02/16/12 0025 02/15/12 2213  CKTOTAL 59 63 68 85 --  CKMB 2.2 2.8 2.3 3.0 --  CKMBINDEX -- -- -- -- --  TROPONINI <0.30 <0.30 <0.30 <0.30 <0.30   BNP: No components found with this basename: POCBNP:5 CBG:  Lab 02/16/12 2356 02/16/12 2031 02/16/12 1612 02/16/12 1118 02/16/12 0725  GLUCAP 220* 184* 177* 243* 249*    No results found for this or any previous visit (from the past 240 hour(s)).   Studies: Ct Head Wo Contrast  02/15/2012  *RADIOLOGY REPORT*  Clinical Data: Confusion; headache.  CT HEAD WITHOUT CONTRAST  Technique:  Contiguous axial images were obtained from the base of the skull through the vertex without contrast.  Comparison: None.  Findings: There is no evidence of acute infarction, mass lesion, or intra- or extra-axial hemorrhage on CT.  The posterior fossa, including the cerebellum, brainstem and fourth ventricle, is within normal limits.  The third and lateral ventricles, and basal ganglia are unremarkable in appearance.  The cerebral hemispheres are symmetric in appearance, with normal gray- white differentiation.  No mass effect or midline shift is seen.  There is no evidence of fracture; visualized osseous structures are unremarkable in appearance.  The visualized portions of the orbits are within normal limits.  The paranasal sinuses and mastoid air cells are well-aerated.  No significant soft tissue abnormalities are seen.  IMPRESSION: Unremarkable noncontrast CT of the head.  Original  Report Authenticated By: Tonia Ghent, M.D.   Ct Angio Chest W/cm &/or Wo Cm  02/16/2012  *RADIOLOGY REPORT*  Clinical Data: The chest pain.  CT ANGIOGRAPHY CHEST  Technique:  Multidetector CT imaging of the chest using the standard protocol during bolus administration of intravenous contrast. Multiplanar reconstructed images including MIPs were obtained and reviewed to evaluate the vascular anatomy.  Contrast: OMNIPAQUE IOHEXOL 350 MG/ML SOLN  Comparison: None.  Findings: Precontrast imaging shows no evidence for a dense crescent to assist with the aortic wall to suggest acute intramural hematoma.  Imaging after IV contrast administration shows no evidence for dissection flap within the thoracic aorta.  Aberrant arch vessel anatomy is noted with the left vertebral artery arising directly from the aortic arch.  The ascending aorta measures 3.0 cm in diameter.  The patient is status post CABG.   Main pulmonary arteries are well opacified and there is no evidence for any filling defects in the main, lobar, or segmental pulmonary arteries to suggest pulmonary embolus.  No subsegmental pulmonary arterial filling defect is identified, but these radicles may be less reliably evaluated.  No axillary, mediastinal, or hilar lymphadenopathy.  There is no pericardial or pleural effusion.  Cysts there is some trace subsegmental atelectasis in the anterior right upper lobe.  No pulmonary edema.  There is some compressive atelectasis in the dependent lung bases bilaterally.  No focal airspace consolidation.  No parenchymal nodule or mass.  Bone windows reveal no worrisome lytic or sclerotic osseous lesions.  IMPRESSION: No evidence for aortic aneurysm or dissection.   No evidence for large central pulmonary embolus.  Original Report Authenticated By: ERIC A. MANSELL, M.D.   Dg Chest Port 1 View  02/15/2012  *RADIOLOGY REPORT*  Clinical Data: Lethargy and chest burning.  PORTABLE CHEST - 1 VIEW  Comparison: Chest  radiograph performed 02/22/2008  Findings: The lungs are hypoexpanded; minimal bibasilar atelectasis is noted.  There is no evidence of pleural effusion or pneumothorax.  The cardiomediastinal silhouette is borderline normal in size; the patient is status post median sternotomy, with evidence of prior CABG.  No acute osseous abnormalities are seen.  IMPRESSION: Lungs hypoexpanded, with minimal bibasilar atelectasis.  Original Report Authenticated By: Tonia Ghent, M.D.    Scheduled Meds:    . aspirin EC  325 mg Oral Daily  . atorvastatin  40 mg Oral q1800  . insulin aspart  0-15 Units Subcutaneous TID WC  . insulin aspart  0-5 Units Subcutaneous QHS  . insulin aspart  3 Units Subcutaneous TID WC  . insulin glargine  20 Units Subcutaneous QHS  . lisinopril  5 mg Oral Daily  . living well with diabetes book   Does not apply Once  . pantoprazole  40 mg Oral Q1200  . pneumococcal 23 valent vaccine  0.5 mL Intramuscular Tomorrow-1000  . sodium chloride  3 mL Intravenous Q12H  . DISCONTD:  insulin aspart  0-9 Units Subcutaneous TID WC   Continuous Infusions:    . sodium chloride 10 mL/hr at 02/16/12 1323  . DISCONTD: heparin 1,400 Units/hr (02/16/12 0641)  . DISCONTD: nitroGLYCERIN 5 mcg/min (02/16/12 0654)

## 2012-02-18 DIAGNOSIS — E1165 Type 2 diabetes mellitus with hyperglycemia: Secondary | ICD-10-CM

## 2012-02-18 DIAGNOSIS — I10 Essential (primary) hypertension: Secondary | ICD-10-CM

## 2012-02-18 DIAGNOSIS — R072 Precordial pain: Secondary | ICD-10-CM

## 2012-02-18 DIAGNOSIS — R55 Syncope and collapse: Secondary | ICD-10-CM

## 2012-02-18 MED ORDER — INSULIN ASPART PROT & ASPART (70-30 MIX) 100 UNIT/ML ~~LOC~~ SUSP: 20.0000 [IU] | Freq: Every day | SUBCUTANEOUS | Status: DC

## 2012-02-18 MED ORDER — INSULIN ASPART PROT & ASPART (70-30 MIX) 100 UNIT/ML ~~LOC~~ SUSP: 30.0000 [IU] | Freq: Every day | SUBCUTANEOUS | Status: DC

## 2012-02-18 MED ORDER — METFORMIN HCL 500 MG PO TABS: 1000.0000 mg | ORAL_TABLET | Freq: Two times a day (BID) | ORAL | Status: DC

## 2012-02-18 NOTE — Progress Notes (Signed)
Patient ID: [name], male   DOB: 1981-01-30, 31 y.o.   MRN: [id number]    Subjective:  NO complaints NO SSCP, dyspnea or palpitattions  Objective:  Filed Vitals:   02/17/12 1115 02/17/12 1400 02/17/12 2100 02/18/12 0500  BP: 115/77 108/69 113/69 103/67  Pulse: 79 79 82 63  Temp:  98.3 F (36.8 C) 98.1 F (36.7 C) 97.8 F (36.6 C)  TempSrc:  Oral Oral Oral  Resp:  18 20 18   Height:      Weight:      SpO2:  98% 98% 97%    Intake/Output from previous day:  Intake/Output Summary (Last 24 hours) at 02/18/12 1014 Last data filed at 02/17/12 2150  Gross per 24 hour  Intake      3 ml  Output    850 ml  Net   -847 ml    Physical Exam: Affect appropriate Healthy:  appears stated age HEENT: normal Neck supple with no adenopathy JVP normal no bruits no thyromegaly Lungs clear with no wheezing and good diaphragmatic motion Heart:  S1/S2 no murmur, no rub, gallop or click PMI normal Abdomen: benighn, BS positve, no tenderness, no AAA no bruit.  No HSM or HJR Distal pulses intact with no bruits No edema Neuro non-focal Skin warm and dry multiple tatoos on arms No muscular weakness   Lab Results: Basic Metabolic Panel:  Basename 02/16/12 0348 02/15/12 2212  NA 137 138  K 3.6 4.3  CL 106 103  CO2 23 24  GLUCOSE 319* 412*  BUN 11 13  CREATININE 0.60 0.63  CALCIUM 8.7 9.2  MG -- --  PHOS -- --   Liver Function Tests:  [facility] 02/16/12 0348 02/15/12 2212  AST 9 19  ALT 18 22  ALKPHOS 44 47  BILITOT 0.2* 0.2*  PROT 5.8* 6.6  ALBUMIN 3.3* 3.8    Basename 02/15/12 2212  LIPASE 29  AMYLASE --   CBC:  Basename 02/16/12 0348 02/15/12 2212  WBC 6.4 6.3  NEUTROABS -- 4.2  HGB 13.2 14.2  HCT 37.9* 39.6  MCV 83.8 83.0  PLT 223 226   Cardiac Enzymes:  Basename 02/16/12 2016 02/16/12 1216 02/16/12 0348  CKTOTAL 59 63 68  CKMB 2.2 2.8 2.3  CKMBINDEX -- -- --  TROPONINI <0.30 <0.30 <0.30    Imaging: Nm Myocar Multi W/spect W/wall Motion /  Ef  02/17/2012  *RADIOLOGY REPORT*  Clinical Data:  Chest pain.  Prior CABG.  MYOCARDIAL IMAGING WITH SPECT (REST AND EXERCISE) GATED LEFT VENTRICULAR WALL MOTION STUDY LEFT VENTRICULAR EJECTION FRACTION  Technique:  Standard myocardial SPECT imaging was performed after resting intravenous injection of  10 mCi Tc-66m tetrofosmin. Subsequently, exercise tolerance test was performed by the patient under the supervision of the Cardiology staff.  At peak-stress, 30 mCi Tc-46m tetrofosminwas injected intravenously and standard myocardial SPECT imaging was performed.  Quantitative gated imaging was also performed to evaluate left ventricular wall motion, and estimate left ventricular ejection fraction.  Comparison:  None  Findings: SPECT imaging demonstrates fixed defect in the inferolateral wall compatible with old infarct/scar.  No significant reversibility to suggest ischemia.  Quantitative gated analysis shows decreased motion in the region of the inferolateral scar.  Otherwise unremarkable.  The resting left ventricular ejection fraction is 51% with end- diastolic volume of 130 ml and end-systolic volume of 64 ml.  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  No evidence of ischemia.  Ejection fraction 51%.  Original Report Authenticated By: Cyndie Chime, M.D.  Cardiac Studies:  ECG: Sinus tachycardia rate 100 no acute ischemic changes   Telemetry: NSR no arrhythmia   Medications:      . aspirin EC  325 mg Oral Daily  . atorvastatin  40 mg Oral q1800  . insulin aspart protamine-insulin aspart  10 Units Subcutaneous Q supper  . insulin aspart protamine-insulin aspart  20 Units Subcutaneous Q breakfast  . lisinopril  5 mg Oral Daily  . pantoprazole  40 mg Oral Q1200  . pneumococcal 23 valent vaccine  0.5 mL Intramuscular Tomorrow-1000  . sodium chloride  3 mL Intravenous Q12H  . DISCONTD: insulin aspart  0-15 Units Subcutaneous TID WC  . DISCONTD: insulin aspart  0-5 Units Subcutaneous QHS  .  DISCONTD: insulin aspart  3 Units Subcutaneous TID WC  . DISCONTD: insulin glargine  20 Units Subcutaneous QHS        . sodium chloride 10 mL/hr at 02/16/12 1323    Assessment/Plan:  Chest Pain:  Atypical previous CABG.  Myovue with small inferolateral scar no ischemia EF 51% DM:  Long discussion with patient about better control of DM and its long term complications if he doesn't Take this more seriously.  Finances are an issue.   Chol;  Statin Will sign off no further cardiac w/u  Charlton Haws 02/18/2012, 10:14 AM

## 2012-02-18 NOTE — Care Management Note (Signed)
    Page 1 of 1   02/18/2012     12:42:15 PM   CARE MANAGEMENT NOTE 02/18/2012  Patient:  [name], [name]   Account Number:  [id number]  Date Initiated:  02/18/2012  Documentation initiated by:  Donn Pierini  Subjective/Objective Assessment:   Pt admitted with chest pain     Action/Plan:   PTA pt lived at home with spouse, independent with ADLs   Anticipated DC Date:  02/18/2012   Anticipated DC Plan:  HOME/SELF CARE      DC Planning Services  CM consult      Choice offered to / List presented to:             Status of service:  Completed, signed off Medicare Important Message given?   (If response is "NO", the following Medicare IM given date fields will be blank) Date Medicare IM given:   Date Additional Medicare IM given:    Discharge Disposition:  HOME/SELF CARE  Per UR Regulation:  Reviewed for med. necessity/level of care/duration of stay  If discussed at Long Length of Stay Meetings, dates discussed:    Comments:  PCP- Sharen Hones  02/18/12- 1240- Donn Pierini RN, BSN [phone] Pt for discharge today, no d/c needs. Pt to return home.

## 2012-02-18 NOTE — Discharge Summary (Signed)
Physician Discharge Summary  [name] QMV:[id number] DOB: 07/29/1981 DOA: 02/15/2012  PCP: Eustaquio Boyden, MD, MD  Admit date: 02/15/2012 Discharge date: 02/18/2012  Discharge Diagnoses:  Atypical chest pain Uncontrolled diabetes mellitus Syncope  Discharge Condition: Stable  Disposition: Home  History of present illness:  31 year old male with known history of CAD status post CABG, diabetes mellitus2, hyperlipidemia and hypertension was brought to the ER after patient had a spell of loss of consciousness. 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 In the ER patient was initially confused but presently he is completely oriented. He still has some chest pressure that is radiating to his neck. Denies any shortness of breath, abdominal pain, incontinence of urine, tongue bite or any focal deficits. Cardiac enzymes and CT head was negative EKG does not show any acute. Patient at this time is admitted for further management and observation.    Hospital Course:  Patient Active Hospital Problem List: Atypical Chest pain (02/16/2012)   He was admitted to the hospital cardiac enzymes were cycled stress test Myoview was done due to to his high risk because of his history of CABG that showed:Myovue with small inferolateral scar no ischemia EF 51%. Counseling was on his diabetes needs to be better controlled. He will need to followup with his primary care Dr. as an outpatient  DIABETES MELLITUS, TYPE II (03/03/2009)  uncontrolled hemoglobin A1c was 13. On admission he was initially on performing this was DC'd and started on 7030 which got his blood glucose stable ranging around 150-250. His metformin be resumed as an outpatient and will need to check CBGs 4 times a day and  bring this to his primary care doctor to titrate his insulin as an outpatient.  Extensive counseling was done about his poor control diabetes. We'll follow the diabetes educator as an outpatient.  HYPERTENSION, UNSPECIFIED (03/03/2009) on lisinopril has been well controlled he will continue this as an outpatient.  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    Discharge Exam: Filed Vitals:   02/18/12 1014  BP: 108/65  Pulse:   Temp:   Resp:    Filed Vitals:   02/17/12 1400 02/17/12 2100 02/18/12 0500 02/18/12 1014  BP: 108/69 113/69 103/67 108/65  Pulse: 79 82 63   Temp: 98.3 F (36.8 C) 98.1 F (36.7 C) 97.8 F (36.6 C)   TempSrc: Oral Oral Oral   Resp: 18 20 18    Height:      Weight:      SpO2: 98% 98% 97%    General: Alert, awake, oriented x3, in no acute distress.  HEENT: No bruits, no goiter.  Heart: Regular rate and rhythm, without murmurs, rubs, gallops.  Lungs: Good air movement, bilateral air movement.  Abdomen: Soft, nontender, nondistended, positive bowel sounds.  Neuro: Grossly intact, nonfocal.   Discharge Instructions  Discharge Orders    Future Orders Please Complete By Expires   Diet - low sodium heart healthy      Increase activity slowly        Medication List  As of 02/18/2012 10:44 AM   TAKE these medications  aspirin EC 81 MG tablet   Take 162 mg by mouth daily.      ibuprofen 200 MG tablet   Commonly known as: ADVIL,MOTRIN   Take 200 mg by mouth every 6 (six) hours as needed. pain      insulin aspart protamine-insulin aspart (70-30) 100 UNIT/ML injection   Commonly known as: NOVOLOG 70/30   Inject 20 Units into the skin daily with supper.      insulin aspart protamine-insulin aspart (70-30) 100 UNIT/ML injection   Commonly known as: NOVOLOG 70/30   Inject 30 Units into the skin daily with breakfast.      lisinopril 5 MG  tablet   Commonly known as: PRINIVIL,ZESTRIL   Take 1 tablet (5 mg total) by mouth daily.      metFORMIN 500 MG tablet   Commonly known as: GLUCOPHAGE   Take 2 tablets (1,000 mg total) by mouth 2 (two) times daily with a meal.      rosuvastatin 20 MG tablet   Commonly known as: CRESTOR   Take 20 mg by mouth daily.           Follow-up Information    Follow up with Eustaquio Boyden, MD in 2 weeks. (hospital follow up)    Contact information:   [address] [place] [address] [phone]           The results of significant diagnostics from this hospitalization (including imaging, microbiology, ancillary and laboratory) are listed below for reference.    Significant Diagnostic Studies: Ct Head Wo Contrast  02/15/2012  *RADIOLOGY REPORT*  Clinical Data: Confusion; headache.  CT HEAD WITHOUT CONTRAST  Technique:  Contiguous axial images were obtained from the base of the skull through the vertex without contrast.  Comparison: None.  Findings: There is no evidence of acute infarction, mass lesion, or intra- or extra-axial hemorrhage on CT.  The posterior fossa, including the cerebellum, brainstem and fourth ventricle, is within normal limits.  The third and lateral ventricles, and basal ganglia are unremarkable in appearance.  The cerebral hemispheres are symmetric in appearance, with normal gray- white differentiation.  No mass effect or midline shift is seen.  There is no evidence of fracture; visualized osseous structures are unremarkable in appearance.  The visualized portions of the orbits are within normal limits.  The paranasal sinuses and mastoid air cells are well-aerated.  No significant soft tissue abnormalities are seen.  IMPRESSION: Unremarkable noncontrast CT of the head.  Original Report Authenticated By: Tonia Ghent, M.D.   Ct Angio Chest W/cm &/or Wo Cm  02/16/2012  *RADIOLOGY REPORT*  Clinical Data: The chest pain.  CT ANGIOGRAPHY CHEST   Technique:  Multidetector CT imaging of the chest using the standard protocol during bolus administration of intravenous contrast. Multiplanar reconstructed images including MIPs were obtained and reviewed to evaluate the vascular anatomy.  Contrast: OMNIPAQUE IOHEXOL 350 MG/ML SOLN  Comparison: None.  Findings: Precontrast imaging shows no evidence for a dense crescent to assist with the aortic wall to suggest acute intramural hematoma.  Imaging after IV contrast administration shows no evidence for dissection flap within the thoracic aorta.  Aberrant arch vessel anatomy is noted with the left vertebral artery arising directly from the aortic arch.  The ascending aorta measures 3.0 cm in diameter.  The patient is status post CABG.   Main pulmonary arteries are well opacified and there is no evidence for any filling defects in the main, lobar, or segmental pulmonary arteries to  suggest pulmonary embolus.  No subsegmental pulmonary arterial filling defect is identified, but these radicles may be less reliably evaluated.  No axillary, mediastinal, or hilar lymphadenopathy.  There is no pericardial or pleural effusion.  Cysts there is some trace subsegmental atelectasis in the anterior right upper lobe.  No pulmonary edema.  There is some compressive atelectasis in the dependent lung bases bilaterally.  No focal airspace consolidation.  No parenchymal nodule or mass.  Bone windows reveal no worrisome lytic or sclerotic osseous lesions.  IMPRESSION: No evidence for aortic aneurysm or dissection.   No evidence for large central pulmonary embolus.  Original Report Authenticated By: ERIC A. MANSELL, M.D.   Nm Myocar Multi W/spect W/wall Motion / Ef  02/17/2012  *RADIOLOGY REPORT*  Clinical Data:  Chest pain.  Prior CABG.  MYOCARDIAL IMAGING WITH SPECT (REST AND EXERCISE) GATED LEFT VENTRICULAR WALL MOTION STUDY LEFT VENTRICULAR EJECTION FRACTION  Technique:  Standard myocardial SPECT imaging was performed after  resting intravenous injection of  10 mCi Tc-81m tetrofosmin. Subsequently, exercise tolerance test was performed by the patient under the supervision of the Cardiology staff.  At peak-stress, 30 mCi Tc-55m tetrofosminwas injected intravenously and standard myocardial SPECT imaging was performed.  Quantitative gated imaging was also performed to evaluate left ventricular wall motion, and estimate left ventricular ejection fraction.  Comparison:  None  Findings: SPECT imaging demonstrates fixed defect in the inferolateral wall compatible with old infarct/scar.  No significant reversibility to suggest ischemia.  Quantitative gated analysis shows decreased motion in the region of the inferolateral scar.  Otherwise unremarkable.  The resting left ventricular ejection fraction is 51% with end- diastolic volume of 130 ml and end-systolic volume of 64 ml.  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  No evidence of ischemia.  Ejection fraction 51%.  Original Report Authenticated By: Cyndie Chime, M.D.   Dg Chest Port 1 View  02/15/2012  *RADIOLOGY REPORT*  Clinical Data: Lethargy and chest burning.  PORTABLE CHEST - 1 VIEW  Comparison: Chest radiograph performed 02/22/2008  Findings: The lungs are hypoexpanded; minimal bibasilar atelectasis is noted.  There is no evidence of pleural effusion or pneumothorax.  The cardiomediastinal silhouette is borderline normal in size; the patient is status post median sternotomy, with evidence of prior CABG.  No acute osseous abnormalities are seen.  IMPRESSION: Lungs hypoexpanded, with minimal bibasilar atelectasis.  Original Report Authenticated By: Tonia Ghent, M.D.    Microbiology: No results found for this or any previous visit (from the past 240 hour(s)).   Labs: Basic Metabolic Panel:  Lab 02/16/12 1308 02/15/12 2212  NA 137 138  K 3.6 4.3  CL 106 103  CO2 23 24  GLUCOSE 319* 412*  BUN 11 13  CREATININE 0.60 0.63  CALCIUM 8.7 9.2  MG -- --  PHOS -- --    Liver Function Tests:  Lab 02/16/12 0348 02/15/12 2212  AST 9 19  ALT 18 22  ALKPHOS 44 47  BILITOT 0.2* 0.2*  PROT 5.8* 6.6  ALBUMIN 3.3* 3.8    Lab 02/15/12 2212  LIPASE 29  AMYLASE --   No results found for this basename: AMMONIA:5 in the last 168 hours CBC:  Lab 02/16/12 0348 02/15/12 2212  WBC 6.4 6.3  NEUTROABS -- 4.2  HGB 13.2 14.2  HCT 37.9* 39.6  MCV 83.8 83.0  PLT 223 226   Cardiac Enzymes:  Lab 02/16/12 2016 02/16/12 1216 02/16/12 0348 02/16/12 0025 02/15/12 2213  CKTOTAL 59 63 68 85 --  CKMB 2.2  2.8 2.3 3.0 --  CKMBINDEX -- -- -- -- --  TROPONINI <0.30 <0.30 <0.30 <0.30 <0.30   BNP: No components found with this basename: POCBNP:5 CBG:  Lab 02/18/12 0811 02/17/12 2030 02/17/12 1636 02/17/12 1146 02/16/12 2356  GLUCAP 239* 256* 136* 333* 220*    Time coordinating discharge:  greater than 45 minutes   Signed:  Marinda Elk  [facility] 02/18/2012, 10:44 AM

## 2012-02-18 NOTE — Progress Notes (Signed)
Discharge review done with patient.   Patient acknowledged understanding of information provided.  Prescriptions called in to pharmacy per MD.  Patient is stable and discharged home with wife.  [name]

## 2012-03-17 ENCOUNTER — Encounter: Payer: Self-pay | Admitting: Cardiovascular Disease

## 2012-03-17 ENCOUNTER — Ambulatory Visit (INDEPENDENT_AMBULATORY_CARE_PROVIDER_SITE_OTHER): Payer: Medicare Other | Admitting: Cardiovascular Disease

## 2012-03-17 VITALS — BP 118/78 | HR 70 | Ht 72.0 in | Wt 253.5 lb

## 2012-03-17 DIAGNOSIS — I25810 Atherosclerosis of coronary artery bypass graft(s) without angina pectoris: Secondary | ICD-10-CM

## 2012-03-17 DIAGNOSIS — E119 Type 2 diabetes mellitus without complications: Secondary | ICD-10-CM

## 2012-03-17 DIAGNOSIS — I10 Essential (primary) hypertension: Secondary | ICD-10-CM

## 2012-03-17 DIAGNOSIS — E785 Hyperlipidemia, unspecified: Secondary | ICD-10-CM

## 2012-03-17 NOTE — Patient Instructions (Signed)
You are doing well. No medication changes were made.  Please call us if you have new issues that need to be addressed before your next appt.  Your physician wants you to follow-up in: 6 months.  You will receive a reminder letter in the mail two months in advance. If you don't receive a letter, please call our office to schedule the follow-up appointment.   

## 2012-03-17 NOTE — Assessment & Plan Note (Signed)
Blood pressure is well controlled on today's visit. No changes made to the medications. 

## 2012-03-17 NOTE — Assessment & Plan Note (Signed)
Recent stress test showing no ischemia. Continue current medications. Work on diabetes control.

## 2012-03-17 NOTE — Assessment & Plan Note (Signed)
Medication compliance and money appears to be an issue. He still has dietary noncompliance. Diet class is scheduled.

## 2012-03-17 NOTE — Progress Notes (Signed)
Patient ID: [name], male    DOB: 1981/09/22, 31 y.o.   MRN: [id number]  HPI Comments: 31 yo with h/o premature CAD s/p CABG and PCI, Smoking history, poorly controlled diabetes, previous evaluation in September 2012 4 chest pain, stress test in 2011 showing no ischemia, last catheterization in 2010 showing patent grafts who presents for routine followup. Medication noncompliance is a big issue.  He has problems with insurance and difficulty affording his insulin. He was recently admitted to [facility] for chest pain and syncope. Hemoglobin A1c was more than 13. He had a stress test in the hospital showing no ischemia, ejection fraction 51%, old inferolateral scar.(Seen previously) Metformin was restarted in addition to his insulin. He was scheduled for outpatient counseling for diabetes control He reports that his sugars continue to be more than 200.   chest pain in 10/11 and went to [facility] where he had a stress test showing basal to mid inferolateral scar and a small area of scar in the mid anteroseptum.  There was no ischemia.  Seen in the clinic 11/11, at which time he was out of his medications and was restarted on them.  In followup clinic visit, he has not been on his medications.   ECG: NSR With rate 70 beats per minute, nonspecific ST abnormality in V3 through V6, 2, 3, aVF      Outpatient Encounter Prescriptions as of 03/17/2012  Medication Sig Dispense Refill  . aspirin 162 MG EC tablet Take 162 mg by mouth daily.      Marland Kitchen ibuprofen (ADVIL,MOTRIN) 200 MG tablet Take 200 mg by mouth every 6 (six) hours as needed. pain      . insulin aspart protamine-insulin aspart (NOVOLOG 70/30) (70-30) 100 UNIT/ML injection Inject into the skin. Takes 20 units every am.      . insulin aspart protamine-insulin aspart (NOVOLOG 70/30) (70-30) 100 UNIT/ML injection Inject into the skin. Takes 30 units every pm.      . metFORMIN (GLUMETZA) 1000 MG (MOD) 24 hr tablet Take 1,000 mg by mouth 2 (two) times  daily.      . rosuvastatin (CRESTOR) 20 MG tablet Take 20 mg by mouth daily.        Review of Systems  Constitutional: Negative.   HENT: Negative.   Eyes: Negative.   Respiratory: Negative.   Cardiovascular: Negative.   Gastrointestinal: Negative.   Musculoskeletal: Negative.   Skin: Negative.   Neurological: Negative.   Hematological: Negative.   Psychiatric/Behavioral: Negative.   All other systems reviewed and are negative.    BP 118/78  Pulse 70  Ht 6' (1.829 m)  Wt 253 lb 8 oz (114.987 kg)  BMI 34.38 kg/m2  Physical Exam  Nursing note and vitals reviewed. Constitutional: He is oriented to person, place, and time. He appears well-developed and well-nourished.  HENT:  Head: Normocephalic.  Nose: Nose normal.  Mouth/Throat: Oropharynx is clear and moist.  Eyes: Conjunctivae are normal. Pupils are equal, round, and reactive to light.  Neck: Normal range of motion. Neck supple. No JVD present.  Cardiovascular: Normal rate, regular rhythm, S1 normal, S2 normal, normal heart sounds and intact distal pulses.  Exam reveals no gallop and no friction rub.   No murmur heard. Pulmonary/Chest: Effort normal and breath sounds normal. No respiratory distress. He has no wheezes. He has no rales. He exhibits no tenderness.  Abdominal: Soft. Bowel sounds are normal. He exhibits no distension. There is no tenderness.  Musculoskeletal: Normal range of motion. He  exhibits no edema and no tenderness.  Lymphadenopathy:    He has no cervical adenopathy.  Neurological: He is alert and oriented to person, place, and time. Coordination normal.  Skin: Skin is warm and dry. No rash noted. No erythema.  Psychiatric: He has a normal mood and affect. His behavior is normal. Judgment and thought content normal.           Assessment and Plan

## 2012-03-17 NOTE — Assessment & Plan Note (Signed)
Cholesterol is at goal on the current lipid regimen. No changes to the medications were made.  

## 2012-03-31 ENCOUNTER — Encounter: Payer: Self-pay | Admitting: Dietician

## 2012-03-31 ENCOUNTER — Encounter: Payer: Medicare Other | Attending: Family Medicine | Admitting: Dietician

## 2012-03-31 NOTE — Progress Notes (Unsigned)
  Medical Nutrition Therapy:  Appt start time: 09end time:  {Time; Appointment:21385}.   Assessment:  Primary concerns today: ***.   MEDICATIONS: ***   DIETARY INTAKE:  Usual eating pattern includes 3 meals and fruit  snacks per day.  Everyday foods include ***.  Avoided foods include junk foods.    24-hr recall:  B ( AM): 8:00 coffee. With Splends  9:00 Sausage biscuit, 2 toast (white ) with margarine, more often boiled egg with the toast or the sausage biscuit and [name] have some fruit.   Snk ( AM): none  L ( PM): 1:00 sandwich ( 2 breads, mayo or ham or tomato or pimento cheese or egg salad) occasionally chips and/fruit.  If breakfast is big will skip lunch. Mixed tea with splenda and sugar Like halfand half. Snk ( PM): fruit D ( PM): 5:00-9:00 3 glasses wine (dry) 1/2 egg salad sandwich with leftover chicken and dumplings.  Salmon, baked potato, salad or broccoli.   Snk ( PM): none Beverages: coffee, half/half tea, wine, water  Usual physical activity: Working in the yard.  No other exercise. 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  Estimated energy needs: *** calories *** g carbohydrates *** g protein *** g fat  Progress Towards Goal(s):  {Desc; Goals Progress:21388}.   Nutritional Diagnosis:  {CHL AMB NUTRITIONAL DIAGNOSIS:[id number]}    Intervention:  Nutrition ***.  Handouts given during visit include:  ***  ***  Monitoring/Evaluation:  Dietary intake, exercise, ***, and body weight {follow up:15908}.

## 2012-03-31 NOTE — Patient Instructions (Signed)
   Plan to have a dilated eye exam during the next 6 weeks, to check for pressures and retinal tissue.  Ask your MD for a prescription to check your blood glucose 2 times per day and check after some after your largest meal of the day.  Check 2 hours after the first bite of a meal.  Goal: 80-160 mg.  Try to continue not using the regular soda and sweet tea.  Try the diet sweetened with the Splenda.  Moderation is the key.  If you do use, use diluted and use with a meal of food, not a stand alone beverage.  Try to not skip meals.  If you miss lunch, have a snack.  This is to help keep your metabolic rate up.  Aim for 3-4 servings of carbohydrate (45-60 gm) at each meal.    Always plan to have a protein with all meals and snacks.  Protein does not have carbohydrate in it as a rule and does not go to glucose.  It will help you feel full, like you have had more to eat, and help to keep your glucose from going so high.Expampe would be a 1/4 cup of nuts with a piece of fruit, or an ounce of cheese or meat with crackers or bread or fruit.  I enjoyed working with you two this morning.  Please let me know if I can answer any further questions or be of help.

## 2012-05-29 ENCOUNTER — Observation Stay: Payer: Self-pay | Admitting: Specialist

## 2012-05-29 DIAGNOSIS — R079 Chest pain, unspecified: Secondary | ICD-10-CM

## 2012-05-29 LAB — BASIC METABOLIC PANEL
Anion Gap: 7 (ref 7–16)
BUN: 12 mg/dL (ref 7–18)
EGFR (African American): >60
EGFR (Non-African Amer.): >60
Glucose: 302 mg/dL — ABNORMAL HIGH (ref 65–99)
Osmolality: 285 (ref 275–301)
Potassium: 4 mmol/L (ref 3.5–5.1)
Sodium: 137 mmol/L (ref 136–145)

## 2012-05-29 LAB — CBC
MCH: 28.8 pg (ref 26.0–34.0)
MCV: 86 fL (ref 80–100)
RBC: 5.33 10*6/uL (ref 4.40–5.90)
WBC: 5.4 10*3/uL (ref 3.8–10.6)

## 2012-05-29 LAB — TROPONIN I: Troponin-I: <0.02 ng/mL

## 2012-05-29 LAB — CK TOTAL AND CKMB (NOT AT ARMC)
CK, Total: 166 U/L (ref 35–232)
CK-MB: 0.9 ng/mL (ref 0.5–3.6)

## 2012-05-29 LAB — CK-MB: CK-MB: 0.8 ng/mL (ref 0.5–3.6)

## 2012-05-30 LAB — TROPONIN I: Troponin-I: <0.02 ng/mL

## 2012-05-30 LAB — CK-MB: CK-MB: 0.9 ng/mL (ref 0.5–3.6)

## 2012-06-09 ENCOUNTER — Encounter: Payer: Medicare Other | Admitting: Cardiovascular Disease

## 2012-06-14 ENCOUNTER — Encounter: Payer: Self-pay | Admitting: Cardiovascular Disease

## 2012-06-19 ENCOUNTER — Encounter: Payer: Medicare Other | Admitting: Cardiovascular Disease

## 2012-06-23 ENCOUNTER — Encounter: Payer: Medicare Other | Admitting: Cardiovascular Disease

## 2012-06-26 ENCOUNTER — Ambulatory Visit: Payer: Medicare Other | Admitting: Family Medicine

## 2012-06-26 DIAGNOSIS — Z0289 Encounter for other administrative examinations: Secondary | ICD-10-CM

## 2012-07-13 ENCOUNTER — Encounter: Payer: Self-pay | Admitting: Cardiovascular Disease

## 2012-09-26 ENCOUNTER — Ambulatory Visit: Payer: Medicare Other | Admitting: Family Medicine

## 2012-09-26 DIAGNOSIS — Z0289 Encounter for other administrative examinations: Secondary | ICD-10-CM

## 2012-10-26 ENCOUNTER — Ambulatory Visit (INDEPENDENT_AMBULATORY_CARE_PROVIDER_SITE_OTHER): Payer: Medicare Other | Admitting: Family Medicine

## 2012-10-26 ENCOUNTER — Encounter: Payer: Self-pay | Admitting: Family Medicine

## 2012-10-26 VITALS — BP 116/82 | HR 98 | Temp 98.0°F | Wt 242.5 lb

## 2012-10-26 DIAGNOSIS — Z23 Encounter for immunization: Secondary | ICD-10-CM

## 2012-10-26 DIAGNOSIS — I10 Essential (primary) hypertension: Secondary | ICD-10-CM

## 2012-10-26 DIAGNOSIS — F4322 Adjustment disorder with anxiety: Secondary | ICD-10-CM

## 2012-10-26 DIAGNOSIS — E119 Type 2 diabetes mellitus without complications: Secondary | ICD-10-CM

## 2012-10-26 DIAGNOSIS — I25810 Atherosclerosis of coronary artery bypass graft(s) without angina pectoris: Secondary | ICD-10-CM

## 2012-10-26 DIAGNOSIS — E785 Hyperlipidemia, unspecified: Secondary | ICD-10-CM

## 2012-10-26 MED ORDER — INSULIN ASPART PROT & ASPART (70-30 MIX) 100 UNIT/ML ~~LOC~~ SUSP: SUBCUTANEOUS | Status: DC

## 2012-10-26 MED ORDER — CITALOPRAM HYDROBROMIDE 10 MG PO TABS: 10.0000 mg | ORAL_TABLET | Freq: Every day | ORAL | Status: DC

## 2012-10-26 MED ORDER — HYDROXYZINE HCL 25 MG PO TABS: 25.0000 mg | ORAL_TABLET | Freq: Three times a day (TID) | ORAL | Status: DC | PRN

## 2012-10-26 MED ORDER — METFORMIN HCL 1000 MG PO TABS: 1000.0000 mg | ORAL_TABLET | Freq: Two times a day (BID) | ORAL | Status: DC

## 2012-10-26 NOTE — Assessment & Plan Note (Signed)
With anxiety attacks, in setting of longterm mild depressed mood. Discussed options, will treat prn hydroxyzine and start long term celexa.  Discussed should not suddenly stop SSRI. rtc 1 mo for f/u mood and DM. Pt agrees with plan.

## 2012-10-26 NOTE — Assessment & Plan Note (Signed)
Continued noncompliance with meds. Lab Results  Component Value Date   HGBA1C 13.4* 02/16/2012  I had a frank discussion with [name] today about his medication noncompliance and specifically with the effects of continued hyperglycemia in setting of CABG at his young age. Advised him to restart novolog 70/30 at lower dose than prior - 25u/15u - and start metformin regularly.  Asked him to return in 1 month for follow up with 1 week log of sugars prior. Discussed all this with wife in room.

## 2012-10-26 NOTE — Assessment & Plan Note (Signed)
See above - discussed concerns of uncontrolled hyperglycemia on lifespan of bypass vessels

## 2012-10-26 NOTE — Assessment & Plan Note (Signed)
BP Readings from Last 3 Encounters:  10/26/12 116/82  03/17/12 118/78  02/18/12 108/65  off meds bp controlled.  Attributed to some weight loss noted.

## 2012-10-26 NOTE — Assessment & Plan Note (Signed)
Due for FLP. Has not been taking statin Provided with 1 mo worth samples of crestor. Lab Results  Component Value Date   CHOL 133 12/28/2010   HDL 43 12/28/2010   LDLCALC 69 12/28/2010   LDLDIRECT 210.0 03/05/2009   TRIG 106 12/28/2010   CHOLHDL 3.1 Ratio 12/28/2010

## 2012-10-26 NOTE — Patient Instructions (Signed)
Start celexa at 10mg  daily.  Once we start the celexa shouldn't just stop med. Start hydroxyzine at 25mg  as needed for anxiety, if makes you too sleepy may cut in half. Restart metformin 1000mg  twice daily. Restart insulin 70/30 at 15 units in am and 25 at night. Samples of crestor provided today. Return in 1 month for follow up.
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
## 2012-10-29 ENCOUNTER — Emergency Department: Payer: Self-pay | Admitting: Internal Medicine

## 2012-10-29 LAB — CBC
HCT: 46 % (ref 40.0–52.0)
HGB: 15.8 g/dL (ref 13.0–18.0)
MCHC: 34.2 g/dL (ref 32.0–36.0)
MCV: 85 fL (ref 80–100)
Platelet: 236 10*3/uL (ref 150–440)

## 2012-10-29 LAB — BASIC METABOLIC PANEL
Anion Gap: 9 (ref 7–16)
BUN: 15 mg/dL (ref 7–18)
Calcium, Total: 8.7 mg/dL (ref 8.5–10.1)
Creatinine: 0.77 mg/dL (ref 0.60–1.30)
Glucose: 349 mg/dL — ABNORMAL HIGH (ref 65–99)
Osmolality: 285 (ref 275–301)
Sodium: 135 mmol/L — ABNORMAL LOW (ref 136–145)

## 2012-10-29 LAB — TROPONIN I
Troponin-I: <0.02 ng/mL
Troponin-I: <0.02 ng/mL

## 2012-11-27 ENCOUNTER — Ambulatory Visit: Payer: Medicare Other | Admitting: Family Medicine

## 2012-11-27 DIAGNOSIS — Z0289 Encounter for other administrative examinations: Secondary | ICD-10-CM

## 2013-04-10 ENCOUNTER — Emergency Department: Payer: Self-pay | Admitting: Emergency Medicine

## 2013-04-10 LAB — COMPREHENSIVE METABOLIC PANEL
Albumin: 3.6 g/dL (ref 3.4–5.0)
Alkaline Phosphatase: 64 U/L (ref 50–136)
Anion Gap: 9 (ref 7–16)
BUN: 13 mg/dL (ref 7–18)
Bilirubin,Total: 0.4 mg/dL (ref 0.2–1.0)
Calcium, Total: 8.9 mg/dL (ref 8.5–10.1)
Chloride: 105 mmol/L (ref 98–107)
Co2: 23 mmol/L (ref 21–32)
Creatinine: 0.51 mg/dL — ABNORMAL LOW (ref 0.60–1.30)
EGFR (Non-African Amer.): >60
Osmolality: 285 (ref 275–301)
Potassium: 4.5 mmol/L (ref 3.5–5.1)
SGOT(AST): 18 U/L (ref 15–37)
Sodium: 137 mmol/L (ref 136–145)

## 2013-04-10 LAB — CK TOTAL AND CKMB (NOT AT ARMC): CK, Total: 99 U/L (ref 35–232)

## 2013-04-10 LAB — CBC
HCT: 40.9 % (ref 40.0–52.0)
HGB: 14.6 g/dL (ref 13.0–18.0)
MCH: 30.3 pg (ref 26.0–34.0)
MCHC: 35.7 g/dL (ref 32.0–36.0)
MCV: 85 fL (ref 80–100)
Platelet: 233 10*3/uL (ref 150–440)
RBC: 4.82 10*6/uL (ref 4.40–5.90)
WBC: 6.2 10*3/uL (ref 3.8–10.6)

## 2013-04-10 LAB — PRO B NATRIURETIC PEPTIDE: B-Type Natriuretic Peptide: 189 pg/mL — ABNORMAL HIGH (ref 0–125)

## 2013-04-19 ENCOUNTER — Other Ambulatory Visit: Payer: Self-pay

## 2013-06-11 ENCOUNTER — Observation Stay: Payer: Self-pay | Admitting: Specialist

## 2013-06-11 DIAGNOSIS — R079 Chest pain, unspecified: Secondary | ICD-10-CM

## 2013-06-11 LAB — CBC
HGB: 15.6 g/dL (ref 13.0–18.0)
MCH: 30 pg (ref 26.0–34.0)
MCHC: 34.3 g/dL (ref 32.0–36.0)
MCV: 88 fL (ref 80–100)
RBC: 5.19 10*6/uL (ref 4.40–5.90)

## 2013-06-11 LAB — DRUG SCREEN, URINE
Amphetamines, Ur Screen: NEGATIVE (ref ?–1000)
Barbiturates, Ur Screen: NEGATIVE (ref ?–200)
Cannabinoid 50 Ng, Ur ~~LOC~~: NEGATIVE (ref ?–50)
Phencyclidine (PCP) Ur S: NEGATIVE (ref ?–25)
Tricyclic, Ur Screen: NEGATIVE (ref ?–1000)

## 2013-06-11 LAB — URINALYSIS, COMPLETE
Bacteria: NONE SEEN
Bilirubin,UR: NEGATIVE
Blood: NEGATIVE
Glucose,UR: >=500 mg/dL (ref 0–75)
Ketone: NEGATIVE
Leukocyte Esterase: NEGATIVE
Protein: NEGATIVE
Squamous Epithelial: NONE SEEN

## 2013-06-11 LAB — LIPID PANEL
HDL Cholesterol: 37 mg/dL — ABNORMAL LOW (ref 40–60)
Ldl Cholesterol, Calc: 194 mg/dL — ABNORMAL HIGH (ref 0–100)
Triglycerides: 390 mg/dL — ABNORMAL HIGH (ref 0–200)

## 2013-06-11 LAB — CK TOTAL AND CKMB (NOT AT ARMC)
CK, Total: 49 U/L (ref 35–232)
CK, Total: 54 U/L (ref 35–232)
CK, Total: 60 U/L (ref 35–232)

## 2013-06-11 LAB — BASIC METABOLIC PANEL
BUN: 13 mg/dL (ref 7–18)
Calcium, Total: 9.5 mg/dL (ref 8.5–10.1)
Chloride: 97 mmol/L — ABNORMAL LOW (ref 98–107)
Glucose: 488 mg/dL — ABNORMAL HIGH (ref 65–99)
Osmolality: 286 (ref 275–301)
Potassium: 3.9 mmol/L (ref 3.5–5.1)
Sodium: 132 mmol/L — ABNORMAL LOW (ref 136–145)

## 2013-06-11 LAB — TSH: Thyroid Stimulating Horm: 4.49 u[IU]/mL

## 2013-06-11 LAB — TROPONIN I
Troponin-I: <0.02 ng/mL
Troponin-I: <0.02 ng/mL

## 2013-06-12 ENCOUNTER — Inpatient Hospital Stay: Payer: Self-pay | Admitting: Psychiatry

## 2013-06-12 LAB — CBC WITH DIFFERENTIAL/PLATELET
Basophil #: 0 10*3/uL (ref 0.0–0.1)
Eosinophil %: 2 %
HCT: 44.3 % (ref 40.0–52.0)
HGB: 15.5 g/dL (ref 13.0–18.0)
Lymphocyte #: 3.6 10*3/uL (ref 1.0–3.6)
Lymphocyte %: 48.8 %
MCH: 30.3 pg (ref 26.0–34.0)
MCHC: 35.1 g/dL (ref 32.0–36.0)
MCV: 86 fL (ref 80–100)
Monocyte %: 8.5 %
Neutrophil #: 2.9 10*3/uL (ref 1.4–6.5)
RDW: 12.4 % (ref 11.5–14.5)
WBC: 7.3 10*3/uL (ref 3.8–10.6)

## 2013-06-12 LAB — BASIC METABOLIC PANEL
BUN: 9 mg/dL (ref 7–18)
Chloride: 105 mmol/L (ref 98–107)
Co2: 24 mmol/L (ref 21–32)
Creatinine: 0.65 mg/dL (ref 0.60–1.30)
EGFR (African American): >60
EGFR (Non-African Amer.): >60
Glucose: 217 mg/dL — ABNORMAL HIGH (ref 65–99)
Potassium: 3.5 mmol/L (ref 3.5–5.1)
Sodium: 138 mmol/L (ref 136–145)

## 2013-11-08 ENCOUNTER — Encounter: Payer: Self-pay | Admitting: Adult Health

## 2013-11-08 ENCOUNTER — Ambulatory Visit (INDEPENDENT_AMBULATORY_CARE_PROVIDER_SITE_OTHER): Payer: Medicare HMO | Admitting: Adult Health

## 2013-11-08 VITALS — BP 122/70 | HR 95 | Resp 12 | Ht 71.5 in | Wt 243.5 lb

## 2013-11-08 DIAGNOSIS — E785 Hyperlipidemia, unspecified: Secondary | ICD-10-CM

## 2013-11-08 DIAGNOSIS — F341 Dysthymic disorder: Secondary | ICD-10-CM

## 2013-11-08 DIAGNOSIS — F418 Other specified anxiety disorders: Secondary | ICD-10-CM | POA: Insufficient documentation

## 2013-11-08 DIAGNOSIS — E782 Mixed hyperlipidemia: Secondary | ICD-10-CM | POA: Insufficient documentation

## 2013-11-08 DIAGNOSIS — M25512 Pain in left shoulder: Secondary | ICD-10-CM | POA: Insufficient documentation

## 2013-11-08 DIAGNOSIS — Z5987 Material hardship: Secondary | ICD-10-CM

## 2013-11-08 DIAGNOSIS — IMO0002 Reserved for concepts with insufficient information to code with codable children: Secondary | ICD-10-CM

## 2013-11-08 DIAGNOSIS — Z599 Problem related to housing and economic circumstances, unspecified: Secondary | ICD-10-CM

## 2013-11-08 DIAGNOSIS — M25519 Pain in unspecified shoulder: Secondary | ICD-10-CM

## 2013-11-08 DIAGNOSIS — Z598 Other problems related to housing and economic circumstances: Secondary | ICD-10-CM

## 2013-11-08 DIAGNOSIS — E1065 Type 1 diabetes mellitus with hyperglycemia: Secondary | ICD-10-CM

## 2013-11-08 NOTE — Assessment & Plan Note (Signed)
Has significant pain with palpation of posterior aspect of  Left shoulder at the ridge of the scapula. Also has pain over his clavicle. Decreased ROM. Refer to Ortho for further evaluation and recommendations.

## 2013-11-08 NOTE — Assessment & Plan Note (Signed)
He is depressed. Struggling with finances. Does not want to take medication for depression. He states that he does not want to depend on medication to make him feel better. Discussed taking medication as a means of getting him through a particular situation or difficult time in his life. Does not have to be forever. He will think about it.

## 2013-11-08 NOTE — Assessment & Plan Note (Signed)
Refer to endocrine. Pt is interested in insulin pump. Last Hgb A1c 13.4% Check HgbA1c

## 2013-11-08 NOTE — Assessment & Plan Note (Signed)
Check lipids. Gave patient samples of Crestor 20 mg (1 month supply)

## 2013-11-08 NOTE — Progress Notes (Signed)
Pre visit review using our clinic review tool, if applicable. No additional management support is needed unless otherwise documented below in the visit note. 

## 2013-11-08 NOTE — Assessment & Plan Note (Signed)
Finances limiting ability to pay for medications. Will need resources. Discuss referral to Child psychotherapistocial Worker or help with medication.
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
## 2013-11-09 ENCOUNTER — Telehealth: Payer: Self-pay

## 2013-11-09 NOTE — Telephone Encounter (Signed)
Relevant patient education mailed to patient.  

## 2013-11-12 ENCOUNTER — Other Ambulatory Visit (INDEPENDENT_AMBULATORY_CARE_PROVIDER_SITE_OTHER): Payer: Medicare HMO

## 2013-11-12 DIAGNOSIS — E1065 Type 1 diabetes mellitus with hyperglycemia: Secondary | ICD-10-CM

## 2013-11-12 DIAGNOSIS — E785 Hyperlipidemia, unspecified: Secondary | ICD-10-CM

## 2013-11-12 DIAGNOSIS — IMO0002 Reserved for concepts with insufficient information to code with codable children: Secondary | ICD-10-CM

## 2013-11-12 LAB — CBC WITH DIFFERENTIAL/PLATELET
BASOS ABS: 0 10*3/uL (ref 0.0–0.1)
Basophils Relative: 0.6 % (ref 0.0–3.0)
EOS PCT: 1.4 % (ref 0.0–5.0)
Eosinophils Absolute: 0.1 10*3/uL (ref 0.0–0.7)
HCT: 49.9 % (ref 39.0–52.0)
Hemoglobin: 16.7 g/dL (ref 13.0–17.0)
Lymphocytes Relative: 37.9 % (ref 12.0–46.0)
Lymphs Abs: 2.5 10*3/uL (ref 0.7–4.0)
MCHC: 33.5 g/dL (ref 30.0–36.0)
MCV: 87.6 fl (ref 78.0–100.0)
MONO ABS: 0.5 10*3/uL (ref 0.1–1.0)
Monocytes Relative: 7.3 % (ref 3.0–12.0)
NEUTROS PCT: 52.8 % (ref 43.0–77.0)
Neutro Abs: 3.5 10*3/uL (ref 1.4–7.7)
Platelets: 235 10*3/uL (ref 150.0–400.0)
RBC: 5.69 Mil/uL (ref 4.22–5.81)
RDW: 11.9 % (ref 11.5–14.6)
WBC: 6.7 10*3/uL (ref 4.5–10.5)

## 2013-11-12 LAB — BASIC METABOLIC PANEL
BUN: 13 mg/dL (ref 6–23)
CO2: 27 mEq/L (ref 19–32)
CREATININE: 0.7 mg/dL (ref 0.4–1.5)
Calcium: 9.7 mg/dL (ref 8.4–10.5)
Chloride: 102 mEq/L (ref 96–112)
GFR: 135.02 mL/min (ref >60.00)
GLUCOSE: 359 mg/dL — AB (ref 70–99)
POTASSIUM: 4.8 meq/L (ref 3.5–5.1)
Sodium: 137 mEq/L (ref 135–145)

## 2013-11-12 LAB — LDL CHOLESTEROL, DIRECT: Direct LDL: 175.2 mg/dL

## 2013-11-12 LAB — MICROALBUMIN / CREATININE URINE RATIO
CREATININE, U: 48.4 mg/dL
MICROALB UR: 1 mg/dL (ref 0.0–1.9)
Microalb Creat Ratio: 2.1 mg/g (ref 0.0–30.0)

## 2013-11-12 LAB — LIPID PANEL
Cholesterol: 258 mg/dL — ABNORMAL HIGH (ref 0–200)
HDL: 40.1 mg/dL (ref >39.00)
Total CHOL/HDL Ratio: 6
Triglycerides: 250 mg/dL — ABNORMAL HIGH (ref 0.0–149.0)
VLDL: 50 mg/dL — AB (ref 0.0–40.0)

## 2013-11-12 LAB — HEMOGLOBIN A1C: HEMOGLOBIN A1C: 13.6 % — AB (ref 4.6–6.5)

## 2013-11-14 ENCOUNTER — Other Ambulatory Visit: Payer: Self-pay | Admitting: Adult Health

## 2013-11-14 DIAGNOSIS — IMO0002 Reserved for concepts with insufficient information to code with codable children: Secondary | ICD-10-CM

## 2013-11-14 DIAGNOSIS — E1065 Type 1 diabetes mellitus with hyperglycemia: Secondary | ICD-10-CM

## 2013-11-14 MED ORDER — "INSULIN SYRINGE 30G X 5/16"" 1 ML MISC": Status: DC

## 2013-11-14 NOTE — Progress Notes (Signed)
Pt.notified

## 2013-11-15 ENCOUNTER — Ambulatory Visit (INDEPENDENT_AMBULATORY_CARE_PROVIDER_SITE_OTHER): Payer: Medicare HMO | Admitting: Family Medicine

## 2013-11-15 ENCOUNTER — Encounter: Payer: Self-pay | Admitting: Family Medicine

## 2013-11-15 VITALS — BP 128/76 | HR 66 | Temp 97.5°F | Wt 241.5 lb

## 2013-11-15 DIAGNOSIS — IMO0002 Reserved for concepts with insufficient information to code with codable children: Secondary | ICD-10-CM

## 2013-11-15 DIAGNOSIS — I25810 Atherosclerosis of coronary artery bypass graft(s) without angina pectoris: Secondary | ICD-10-CM

## 2013-11-15 DIAGNOSIS — M25519 Pain in unspecified shoulder: Secondary | ICD-10-CM

## 2013-11-15 DIAGNOSIS — M25512 Pain in left shoulder: Secondary | ICD-10-CM

## 2013-11-15 DIAGNOSIS — E1065 Type 1 diabetes mellitus with hyperglycemia: Secondary | ICD-10-CM

## 2013-11-15 DIAGNOSIS — E108 Type 1 diabetes mellitus with unspecified complications: Secondary | ICD-10-CM

## 2013-11-15 LAB — TROPONIN I: Troponin I: <0.01 ng/mL (ref <0.06)

## 2013-11-15 MED ORDER — TRAMADOL HCL 50 MG PO TABS: 50.0000 mg | ORAL_TABLET | Freq: Two times a day (BID) | ORAL | Status: DC | PRN

## 2013-11-15 MED ORDER — CYCLOBENZAPRINE HCL 10 MG PO TABS: 5.0000 mg | ORAL_TABLET | Freq: Two times a day (BID) | ORAL | Status: DC | PRN

## 2013-11-15 NOTE — Patient Instructions (Signed)
I think your pain is coming from left neck and back muscles. Treat with tramadol for pain as well as flexeril muscle relaxant. Don't start them both together as they can both cause sleepiness. I am worried about your heart however, and will draw blood work today and want you to return to see Dr. Mariah MillingGollan - we will call you tomorrow to set this up. If worsening shoulder pain not improved with above or any chest pain, please seek urgent care.

## 2013-11-15 NOTE — Progress Notes (Signed)
Pre-visit discussion using our clinic review tool. No additional management support is needed unless otherwise documented below in the visit note.  

## 2013-11-15 NOTE — Progress Notes (Signed)
   Subjective:    Patient ID: [name] FillersAdam L [name], male    DOB: 04/04/1981, 33 y.o.   MRN: [id number]  HPI CC: L shoulder pain  33 yo with complicated medical history including uncontrolled DM, CAD s/p CABG, and HTN, HLD who established last month with [facility] (and desires to continue care there due to transportation issues) presents today with 3 mo h/o L shoulder pain.  Very tender, painful to throw ball.  Having shooting burning pain down to fingertips.  Pain starts at posterior shoulder and travels across anterior shoulder down into arm.  Denies inciting trauma/injury or fall.  Worsened with arm up over head and when sleeping on arm at night.  Nothing improves pain.  Has tried massage, ibuprofen, aleve.  Ice and heat worsens pain.  H/o CAD s/p MI 2006 and CABG 2007 - did not have shoulder pain at that time.  Shoulder pain not exertional, not relieved by rest. Denies chest pain, jaw pain, dyspnea or nausea.    Last week referred to ortho but has not been yet.  Lab Results  Component Value Date   HGBA1C 13.6* 11/12/2013  Is not currently taking novolog 70/30 "I'm going to get started on the pump".  To see endocrinologist later this month.  Restarted crestor last week.  Past Medical History  Diagnosis Date  . Coronary atherosclerosis of artery bypass graft     Age 33 - [place]  . Chest pain, unspecified   . Uncontrolled diabetes mellitus with complications 2002    poorly controlled  . HLD (hyperlipidemia)     poorly controlled  . HTN (hypertension)   . Obesity, unspecified   . Hx-TIA (transient ischemic attack) 2009    possible-([facility]) Normal MRI of brain and MRA of the head and neck  . Noncompliance     Past Surgical History  Procedure Laterality Date  . Coronary artery bypass graft  2007    x 4 (age 33)   Review of Systems Per HPI    Objective:   Physical Exam  Nursing note and vitals reviewed. Constitutional: He appears well-developed and well-nourished. No distress.    HENT:  Mouth/Throat: Oropharynx is clear and moist. No oropharyngeal exudate.  Neck: Normal range of motion. Neck supple.  FROM at neck.  Cardiovascular: Regular rhythm, normal heart sounds and intact distal pulses.  Tachycardia present.   No murmur heard. Pulmonary/Chest: Effort normal and breath sounds normal. No respiratory distress. He has no wheezes. He has no rales.  Musculoskeletal: He exhibits no edema.  Tender at L [facility] joint as well as max tenderness at supraspinatus belly at L shoulder blade and L trap belly. FROM shoulders albeit with pain. + empty can sign No pain with crossover test No pain with rotation of humeral head in [facility] joint + impingement Carries L shoulder depressed 2/2 pain  Lymphadenopathy:    He has no cervical adenopathy.  Neurological:  5/5 strength BUE, grip strength intact Neg spurling test Sensation intact  Skin: Skin is warm and dry. No rash noted.       Assessment & Plan:

## 2013-11-16 ENCOUNTER — Encounter: Payer: Self-pay | Admitting: Family Medicine

## 2013-11-16 NOTE — Assessment & Plan Note (Signed)
Given significant CAD history s/p CABG and endorsed L shoulder pain, checked EKG.  I do think his current pain is MSK in origin. However, he does have some EKG changes compared to last one dated 03/2012.  He is already on aspirin 162mg  daily, and had recently restarted crestor (last week).  BP well controlled today. He is intolerant of nitroglycerin (nausea). I have asked him to re establish with Dr. Mariah MillingGollan and patient is aware to seek urgent care if any chest pain at all. TnI today normal.  EKG - NSR 100, normal axis, intervals, no acute ST/T changes, but diffuse T wave changes (precordial and inferior lead flattening) new from prior EKG 03/2012.

## 2013-11-16 NOTE — Assessment & Plan Note (Signed)
Chronically uncontrolled 2/2 nonadherence to med regimen and diet. ?type 1 vs type 2.  Pt to establish with endocrinology to discuss pump. Again reviewed uncontrolled diabetes as a risk factor for progression of CAD - and frozen shoulder. Lab Results  Component Value Date   HGBA1C 13.6* 11/12/2013

## 2013-11-16 NOTE — Assessment & Plan Note (Signed)
Exam today most consistent with L muscle strain/spasm given location of pain.  Treat with flexeril as muscle relaxant and tramadol for breakthrough pain.  Want to avoid NSAIDs with cardiac history.  Pt does not tolerate ice/heat. Also anticipate beginnings of RTC tendinopathy.  Less likely cervical etiology although he does endorse some cervical radiculopathy symptoms. Discussed ROM exercises to help prevent frozen shoulder. Given extensive personal h/o CAD,did check EKG to evaluate left pain although pt denies chest discomfort associated with this and pain is positional and more consistent with MSK cause.  See below.

## 2013-11-21 ENCOUNTER — Emergency Department: Payer: Self-pay

## 2013-11-21 LAB — CBC
HCT: 44.9 % (ref 40.0–52.0)
HGB: 15.5 g/dL (ref 13.0–18.0)
MCH: 29.6 pg (ref 26.0–34.0)
MCHC: 34.5 g/dL (ref 32.0–36.0)
MCV: 86 fL (ref 80–100)
PLATELETS: 243 10*3/uL (ref 150–440)
RBC: 5.23 10*6/uL (ref 4.40–5.90)
RDW: 12.6 % (ref 11.5–14.5)
WBC: 6.9 10*3/uL (ref 3.8–10.6)

## 2013-11-21 LAB — TROPONIN I: Troponin-I: <0.02 ng/mL

## 2013-11-21 LAB — BASIC METABOLIC PANEL
Anion Gap: 8 (ref 7–16)
BUN: 11 mg/dL (ref 7–18)
Calcium, Total: 8.8 mg/dL (ref 8.5–10.1)
Chloride: 101 mmol/L (ref 98–107)
Co2: 23 mmol/L (ref 21–32)
Creatinine: 1.04 mg/dL (ref 0.60–1.30)
EGFR (African American): >60
Glucose: 438 mg/dL — ABNORMAL HIGH (ref 65–99)
Osmolality: 283 (ref 275–301)
POTASSIUM: 4.2 mmol/L (ref 3.5–5.1)
SODIUM: 132 mmol/L — AB (ref 136–145)

## 2013-11-28 ENCOUNTER — Encounter: Payer: Self-pay | Admitting: Cardiovascular Disease

## 2013-11-28 ENCOUNTER — Other Ambulatory Visit: Payer: Self-pay | Admitting: Cardiovascular Disease

## 2013-11-28 ENCOUNTER — Ambulatory Visit (INDEPENDENT_AMBULATORY_CARE_PROVIDER_SITE_OTHER): Payer: Commercial Managed Care - HMO | Admitting: Cardiovascular Disease

## 2013-11-28 ENCOUNTER — Ambulatory Visit: Payer: Medicare HMO | Admitting: Cardiovascular Disease

## 2013-11-28 VITALS — BP 120/88 | HR 100 | Ht 71.0 in | Wt 238.8 lb

## 2013-11-28 DIAGNOSIS — R079 Chest pain, unspecified: Secondary | ICD-10-CM

## 2013-11-28 DIAGNOSIS — E785 Hyperlipidemia, unspecified: Secondary | ICD-10-CM

## 2013-11-28 DIAGNOSIS — I10 Essential (primary) hypertension: Secondary | ICD-10-CM

## 2013-11-28 DIAGNOSIS — I25810 Atherosclerosis of coronary artery bypass graft(s) without angina pectoris: Secondary | ICD-10-CM

## 2013-11-28 DIAGNOSIS — E108 Type 1 diabetes mellitus with unspecified complications: Secondary | ICD-10-CM

## 2013-11-28 DIAGNOSIS — E1065 Type 1 diabetes mellitus with hyperglycemia: Secondary | ICD-10-CM

## 2013-11-28 DIAGNOSIS — IMO0002 Reserved for concepts with insufficient information to code with codable children: Secondary | ICD-10-CM

## 2013-11-28 DIAGNOSIS — R000 Tachycardia, unspecified: Secondary | ICD-10-CM

## 2013-11-28 LAB — TROPONIN I: Troponin-I: <0.02 ng/mL

## 2013-11-28 LAB — TSH: Thyroid Stimulating Horm: 1.62 u[IU]/mL

## 2013-11-28 MED ORDER — METOPROLOL TARTRATE 25 MG PO TABS: 25.0000 mg | ORAL_TABLET | Freq: Two times a day (BID) | ORAL | Status: DC

## 2013-11-28 MED ORDER — SIMVASTATIN 40 MG PO TABS: 40.0000 mg | ORAL_TABLET | Freq: Every day | ORAL | Status: DC

## 2013-11-28 NOTE — Patient Instructions (Addendum)
Heart rate is elevated Please start metoprolol one pill twice a day  Hold crestor when you run out and start simvastatin one a day  We will check cardiac enz today  Please start ibuprofen three times a day for possible pericarditis  Please call if chest pain symptoms do not start to improved by the end of this week  Please call us if you have new issues that need to be addressed before your next appt.  Your physician wants you to follow-up in: 1 month.

## 2013-11-28 NOTE — Assessment & Plan Note (Signed)
Continues to have atypical chest pain today. Negative cardiac enzymes last week and again today. He does have EKG changes. Unable to rule out pericarditis. If no improvement in symptoms with NSAIDs, we'll consider other options such as ischemia workup

## 2013-11-28 NOTE — Assessment & Plan Note (Signed)
He reports that he has followup with endocrine. Encouraged him to change his habits, follow guidance by the endocrinologist

## 2013-11-28 NOTE — Progress Notes (Signed)
Patient ID: [name], male    DOB: 08-15-1981, 33 y.o.   MRN: [id number]  HPI Comments: 33 yo with h/o premature CAD s/p CABG and PCI, Smoking history, poorly controlled diabetes, previous evaluation in September 2012 for chest pain, stress test in 2011 showing no ischemia, last catheterization in 2010 showing patent grafts who presents for routine followup. Medication noncompliance is a big issue. Last stress test September 2014 showing no ischemia  He has problems with insurance and difficulty affording his insulin. Hemoglobin A1c typically more than 13 He presents today and reports that he has had chronic chest pain over the past month. He was seen in the emergency room 11/21/2013. Cardiac enzymes negative x2. It was suggested that he had nonspecific T wave abnormality in 1 and aVL.  He reports that chest pain has been constant, no positional changes, does not get worse with exertion. It feels different from prior coronary disease, for which she required a stent. Denies any nausea vomiting, denies any shortness of breath. No diaphoresis. chest pain in 10/11 and went to [facility] where he had a stress test showing basal to mid inferolateral scar and a small area of scar in the mid anteroseptum.  There was no ischemia.  Seen in the clinic 11/11, at which time he was out of his medications and was restarted on them.  In followup clinic visit, he has not been on his medications.   ECG: NSR With rate 100 beats per minute with T wave abnormality in leads V4 through V6, 3, aVF these are new changes compared to prior EKGs     Outpatient Encounter Prescriptions as of 11/28/2013  Medication Sig  . aspirin 162 MG EC tablet Take 162 mg by mouth daily.  . cyclobenzaprine (FLEXERIL) 10 MG tablet Take 0.5-1 tablets (5-10 mg total) by mouth 2 (two) times daily as needed for muscle spasms.  . insulin aspart protamine-insulin aspart (NOVOLOG 70/30) (70-30) 100 UNIT/ML injection 15 u in am and 25u at night   . Insulin Syringe-Needle U-100 (INSULIN SYRINGE 1CC/30GX5/16") 30G X 5/16" 1 ML MISC Inject insulin per instructions in the AM and PM  . rosuvastatin (CRESTOR) 20 MG tablet Take 20 mg by mouth daily.  . traMADol (ULTRAM) 50 MG tablet Take 1 tablet (50 mg total) by mouth 2 (two) times daily as needed for moderate pain.    Review of Systems  Constitutional: Negative.   HENT: Negative.   Eyes: Negative.   Respiratory: Negative.   Cardiovascular: Negative.   Gastrointestinal: Negative.   Endocrine: Negative.   Musculoskeletal: Negative.   Skin: Negative.   Allergic/Immunologic: Negative.   Neurological: Negative.   Hematological: Negative.   Psychiatric/Behavioral: Negative.   All other systems reviewed and are negative.    BP 120/88  Pulse 100  Ht 5\' 11"  (1.803 m)  Wt 238 lb 12 oz (108.296 kg)  BMI 33.31 kg/m2  Physical Exam  Nursing note and vitals reviewed. Constitutional: He is oriented to person, place, and time. He appears well-developed and well-nourished.  HENT:  Head: Normocephalic.  Nose: Nose normal.  Mouth/Throat: Oropharynx is clear and moist.  Eyes: Conjunctivae are normal. Pupils are equal, round, and reactive to light.  Neck: Normal range of motion. Neck supple. No JVD present.  Cardiovascular: Normal rate, regular rhythm, S1 normal, S2 normal, normal heart sounds and intact distal pulses.  Exam reveals no gallop and no friction rub.   No murmur heard. Pulmonary/Chest: Effort normal and breath sounds normal. No respiratory  distress. He has no wheezes. He has no rales. He exhibits no tenderness.  Abdominal: Soft. Bowel sounds are normal. He exhibits no distension. There is no tenderness.  Musculoskeletal: Normal range of motion. He exhibits no edema and no tenderness.  Lymphadenopathy:    He has no cervical adenopathy.  Neurological: He is alert and oriented to person, place, and time. Coordination normal.  Skin: Skin is warm and dry. No rash noted. No  erythema.  Psychiatric: He has a normal mood and affect. His behavior is normal. Judgment and thought content normal.      Assessment and Plan

## 2013-11-28 NOTE — Assessment & Plan Note (Addendum)
Heart rate is elevated today. We will start metoprolol 25 mg twice a day

## 2013-11-28 NOTE — Assessment & Plan Note (Signed)
Atypical chest pain. Negative cardiac enzymes on February 11 and again today. He does have EKG changes. This raises the concern of pericarditis. We have recommended he try NSAIDs. If no improvement in his symptoms over the next week or so, could consider catheterization

## 2013-11-28 NOTE — Assessment & Plan Note (Signed)
Has not been taking Crestor. We have recommended when he runs out of Crestor samples that he start simvastatin 40 mg daily for cost

## 2013-11-29 ENCOUNTER — Ambulatory Visit: Payer: Medicare Other | Admitting: Internal Medicine

## 2013-11-29 DIAGNOSIS — Z0289 Encounter for other administrative examinations: Secondary | ICD-10-CM

## 2013-12-21 ENCOUNTER — Encounter: Payer: Self-pay | Admitting: Adult Health

## 2013-12-21 ENCOUNTER — Ambulatory Visit (INDEPENDENT_AMBULATORY_CARE_PROVIDER_SITE_OTHER): Payer: Medicare HMO | Admitting: Adult Health

## 2013-12-21 ENCOUNTER — Telehealth: Payer: Self-pay | Admitting: Adult Health

## 2013-12-21 VITALS — BP 112/68 | HR 84 | Resp 16 | Wt 244.0 lb

## 2013-12-21 DIAGNOSIS — M722 Plantar fascial fibromatosis: Secondary | ICD-10-CM

## 2013-12-21 DIAGNOSIS — IMO0002 Reserved for concepts with insufficient information to code with codable children: Secondary | ICD-10-CM

## 2013-12-21 DIAGNOSIS — M25512 Pain in left shoulder: Secondary | ICD-10-CM

## 2013-12-21 DIAGNOSIS — E1165 Type 2 diabetes mellitus with hyperglycemia: Secondary | ICD-10-CM

## 2013-12-21 DIAGNOSIS — IMO0001 Reserved for inherently not codable concepts without codable children: Secondary | ICD-10-CM

## 2013-12-21 DIAGNOSIS — M25519 Pain in unspecified shoulder: Secondary | ICD-10-CM

## 2013-12-21 MED ORDER — HYDROCODONE-ACETAMINOPHEN 10-325 MG PO TABS: 1.0000 | ORAL_TABLET | Freq: Four times a day (QID) | ORAL | Status: DC | PRN

## 2013-12-21 NOTE — Progress Notes (Signed)
Pre visit review using our clinic review tool, if applicable. No additional management support is needed unless otherwise documented below in the visit note. 

## 2013-12-21 NOTE — Telephone Encounter (Signed)
At check-out pt asks to inform Raquel that he was seen at orthopedics [address]  Does not know Dr. name.

## 2013-12-21 NOTE — Progress Notes (Signed)
Subjective:    Patient ID: [name], male    DOB: 1981/01/27, 33 y.o.   MRN: [id number]  HPI [name] is a pleasant 33 y/o male with multiple medical problems as listed below including CAD s/p CABG, HTN, HLD, uncontrolled DM. He was seen on 11/15/13 by Dr. Sharen Hones for left shoulder pain. He has tried conservative treatment including NSAIDs, ice, rest. Pt is left handed. He was started on tramadol and flexeril however he has not noticed much improvement in his pain. He has been doing ROM exercises.  He also wants to discuss his uncontrolled diabetes. Pt reports his fasting blood glucose has been running in the 300s. He has not used his insulin in greater than 1.5 years. He reports that he is ready to try and get his blood glucose under control. He has an upcoming appointment with Dr. Elvera Lennox.  [name] is also having pain in bilateral feet 2/2 plantar fasciitis. He is requesting a referral to podiatry.     Past Medical History  Diagnosis Date  . Coronary atherosclerosis of artery bypass graft     Age 70 - [name]  . Chest pain, unspecified   . Uncontrolled diabetes mellitus with complications 2002    poorly controlled  . HLD (hyperlipidemia)     poorly controlled  . HTN (hypertension)   . Obesity, unspecified   . Hx-TIA (transient ischemic attack) 2009    possible-([facility]) Normal MRI of brain and MRA of the head and neck  . Noncompliance      Review of Systems  Constitutional: Negative.   HENT: Negative.   Respiratory: Negative.   Cardiovascular: Negative for chest pain and leg swelling.  Gastrointestinal: Negative.   Endocrine: Negative.   Genitourinary: Negative.   Musculoskeletal: Positive for arthralgias (left shoulder pain).       Heel pain 2/2 plantar fasciitis  Skin: Negative.   Neurological: Negative.   Psychiatric/Behavioral: Nervous/anxious: depression.        Objective:   Physical Exam  Constitutional: He is oriented to person, place, and time.  Pleasant 33  y/o male in NAD  Cardiovascular: Normal rate and regular rhythm.   Pulmonary/Chest: Effort normal. No respiratory distress.  Musculoskeletal: He exhibits tenderness.  Decreased ROM L arm. Pain with abduction.   Neurological: He is alert and oriented to person, place, and time.  Skin: Skin is warm and dry.  Psychiatric: He has a normal mood and affect. His behavior is normal. Judgment and thought content normal.          Assessment & Plan:    1. Left shoulder pain Still having pain. Refer to ortho. Norco 1 tablet every 6 hours as needed for pain. - Ambulatory referral to Orthopedic Surgery  2. Plantar fasciitis Hx of plantar fasciitis. Refer to podiatry - Ambulatory referral to Podiatry  3. Uncontrolled DM Pt has not been motivated until now to control his diabetes. Part of the problems was financial but there was also an element of denial as well as depression r/t being so sick and now on disability. He recently obtained insurance. Does not know which insulin is the preferred one on his insurance. I have asked him to contact his insurance to inquire and I will be glad to order. I have provided him with samples of Novolog pen. He will start 10 units in the AM and PM. He will check his blood glucose bid and record. I have instructed him to increase his insulin dose every 3 days by 3  units in the AM and PM until BG below 150. He has appointment with Dr. Elvera [name] coming up. There is some confusion as to whether he is type 1 or II.

## 2013-12-24 NOTE — Telephone Encounter (Signed)
Amber, Pt does not want to be referred to the podiatrist he saw [address] (?) Can you refer him to a [place] podiatrist? Do we have one? Thanks, Raquel

## 2013-12-25 ENCOUNTER — Ambulatory Visit: Payer: Medicare HMO | Admitting: Cardiovascular Disease

## 2013-12-25 NOTE — Telephone Encounter (Signed)
[facility], its either [facility] or [facility].

## 2013-12-25 NOTE — Telephone Encounter (Signed)
He went to the one that is off [address]. He does not want to go there.

## 2013-12-25 NOTE — Telephone Encounter (Signed)
He is already scheduled and I spoke with him yesterday. He didn't state that he didn't want to go there. He accepted apt

## 2013-12-28 ENCOUNTER — Encounter: Payer: Self-pay | Admitting: Cardiovascular Disease

## 2013-12-28 ENCOUNTER — Ambulatory Visit (INDEPENDENT_AMBULATORY_CARE_PROVIDER_SITE_OTHER): Payer: Medicare HMO | Admitting: Cardiovascular Disease

## 2013-12-28 VITALS — BP 110/78 | HR 78 | Ht 72.0 in | Wt 242.8 lb

## 2013-12-28 DIAGNOSIS — E1065 Type 1 diabetes mellitus with hyperglycemia: Secondary | ICD-10-CM

## 2013-12-28 DIAGNOSIS — R0602 Shortness of breath: Secondary | ICD-10-CM

## 2013-12-28 DIAGNOSIS — IMO0002 Reserved for concepts with insufficient information to code with codable children: Secondary | ICD-10-CM

## 2013-12-28 DIAGNOSIS — E108 Type 1 diabetes mellitus with unspecified complications: Secondary | ICD-10-CM

## 2013-12-28 DIAGNOSIS — E785 Hyperlipidemia, unspecified: Secondary | ICD-10-CM

## 2013-12-28 DIAGNOSIS — R079 Chest pain, unspecified: Secondary | ICD-10-CM

## 2013-12-28 DIAGNOSIS — I25810 Atherosclerosis of coronary artery bypass graft(s) without angina pectoris: Secondary | ICD-10-CM

## 2013-12-28 NOTE — Assessment & Plan Note (Signed)
Encouraged him to stay on either Crestor or simvastatin. Goal LDL less than 70

## 2013-12-28 NOTE — Progress Notes (Signed)
Patient ID: [name], male    DOB: Apr 20, 1981, 33 y.o.   MRN: [id number]  HPI Comments: 33 yo with h/o premature CAD s/p CABG and PCI, smoking history, continued poorly controlled diabetes, previous evaluation in September 2012 for chest pain, stress test in 2011 showing no ischemia, last catheterization in 2010 showing patent grafts who presents for routine followup. Medication noncompliance is a big issue. Last stress test September 2014 showing no ischemia  Previously had problems with insurance and difficulty affording his insulin. Hemoglobin A1c typically more than 13   seen in the emergency room 11/21/2013 for chronic chest pain.  Cardiac enzymes negative x2. No further workup at that time Seen in clinic in followup and felt it was atypical in nature  On today's visit he reports that he is doing well with no significant chest pain. hemoglobin A1c continues to be more than 13.  Evaluation of his periodic cholesterol levels shows he is on and off his statins. Reports he is taking Crestor 20 mg daily and we'll change to simvastatin.  Chest  pain in 10/11 and [facility] where he had a stress test showing basal to mid inferolateral scar and a small area of scar in the mid anteroseptum.  There was no ischemia.   ECG: NSR With rate 78 beats per minute with T wave abnormality in leads V5 through V6, II, III, aVF      Outpatient Encounter Prescriptions as of 12/28/2013  Medication Sig  . aspirin 162 MG EC tablet Take 162 mg by mouth daily.  Marland Kitchen. HYDROcodone-acetaminophen (NORCO) 10-325 MG per tablet Take 1 tablet by mouth every 6 (six) hours as needed.  . Insulin Syringe-Needle U-100 (INSULIN SYRINGE 1CC/30GX5/16") 30G X 5/16" 1 ML MISC Inject insulin per instructions in the AM and PM  . metoprolol tartrate (LOPRESSOR) 25 MG tablet Take 1 tablet (25 mg total) by mouth 2 (two) times daily.  . rosuvastatin (CRESTOR) 20 MG tablet Take 20 mg by mouth daily.  . simvastatin (ZOCOR) 40 MG  tablet Take 1 tablet (40 mg total) by mouth at bedtime. He has not filled this yet     Review of Systems  Constitutional: Negative.   HENT: Negative.   Eyes: Negative.   Respiratory: Negative.   Cardiovascular: Negative.   Gastrointestinal: Negative.   Endocrine: Negative.   Musculoskeletal: Negative.   Skin: Negative.   Allergic/Immunologic: Negative.   Neurological: Negative.   Hematological: Negative.   Psychiatric/Behavioral: Negative.   All other systems reviewed and are negative.    BP 110/78  Pulse 78  Ht 6' (1.829 m)  Wt 242 lb 12 oz (110.111 kg)  BMI 32.92 kg/m2  Physical Exam  Nursing note and vitals reviewed. Constitutional: He is oriented to person, place, and time. He appears well-developed and well-nourished.  HENT:  Head: Normocephalic.  Nose: Nose normal.  Mouth/Throat: Oropharynx is clear and moist.  Eyes: Conjunctivae are normal. Pupils are equal, round, and reactive to light.  Neck: Normal range of motion. Neck supple. No JVD present.  Cardiovascular: Normal rate, regular rhythm, S1 normal, S2 normal, normal heart sounds and intact distal pulses.  Exam reveals no gallop and no friction rub.   No murmur heard. Pulmonary/Chest: Effort normal and breath sounds normal. No respiratory distress. He has no wheezes. He has no rales. He exhibits no tenderness.  Abdominal: Soft. Bowel sounds are normal. He exhibits no distension. There is no tenderness.  Musculoskeletal: Normal range of motion. He exhibits no edema  and no tenderness.  Lymphadenopathy:    He has no cervical adenopathy.  Neurological: He is alert and oriented to person, place, and time. Coordination normal.  Skin: Skin is warm and dry. No rash noted. No erythema.  Psychiatric: He has a normal mood and affect. His behavior is normal. Judgment and thought content normal.      Assessment and Plan

## 2013-12-28 NOTE — Assessment & Plan Note (Signed)
Prior episodes of chest pain have been atypical in nature. Currently with no chest pain. Certainly high risk of ischemia and angina

## 2013-12-28 NOTE — Patient Instructions (Signed)
You are doing well. No medication changes were made.  Please call us if you have new issues that need to be addressed before your next appt.  Your physician wants you to follow-up in: 6 months.  You will receive a reminder letter in the mail two months in advance. If you don't receive a letter, please call our office to schedule the follow-up appointment.   

## 2013-12-28 NOTE — Assessment & Plan Note (Addendum)
Currently with no symptoms of angina. No further workup at this time. Continue current medication regimen. High risk of recurrent coronary disease and worsening stenosis given poorly controlled diabetes. Suggested he focuses solely on this at this time

## 2013-12-28 NOTE — Assessment & Plan Note (Signed)
Continues to have poorly controlled diabetes. High risk of complications including heart attack. Suggested he followup with endocrine once [place] get [place]

## 2014-01-04 ENCOUNTER — Ambulatory Visit: Payer: Self-pay | Admitting: Podiatry

## 2014-01-16 ENCOUNTER — Ambulatory Visit: Payer: Medicare Other | Admitting: Internal Medicine

## 2014-01-18 ENCOUNTER — Ambulatory Visit (INDEPENDENT_AMBULATORY_CARE_PROVIDER_SITE_OTHER): Payer: Commercial Managed Care - HMO

## 2014-01-18 ENCOUNTER — Ambulatory Visit (INDEPENDENT_AMBULATORY_CARE_PROVIDER_SITE_OTHER): Payer: Commercial Managed Care - HMO | Admitting: Podiatry

## 2014-01-18 ENCOUNTER — Encounter: Payer: Self-pay | Admitting: Podiatry

## 2014-01-18 VITALS — Ht 72.0 in | Wt 238.0 lb

## 2014-01-18 DIAGNOSIS — M722 Plantar fascial fibromatosis: Secondary | ICD-10-CM | POA: Diagnosis not present

## 2014-01-18 MED ORDER — TRIAMCINOLONE ACETONIDE 10 MG/ML IJ SUSP
10.0000 mg | Freq: Once | INTRAMUSCULAR | Status: AC
  Administered 2014-01-18: 10 mg

## 2014-01-18 NOTE — Patient Instructions (Addendum)
Plantar Fasciitis (Heel Spur Syndrome) with Rehab The plantar fascia is a fibrous, ligament-like, soft-tissue structure that spans the bottom of the foot. Plantar fasciitis is a condition that causes pain in the foot due to inflammation of the tissue. SYMPTOMS   Pain and tenderness on the underneath side of the foot.  Pain that worsens with standing or walking. CAUSES  Plantar fasciitis is caused by irritation and injury to the plantar fascia on the underneath side of the foot. Common mechanisms of injury include:  Direct trauma to bottom of the foot.  Damage to a small nerve that runs under the foot where the main fascia attaches to the heel bone.  Stress placed on the plantar fascia due to bone spurs. RISK INCREASES WITH:   Activities that place stress on the plantar fascia (running, jumping, pivoting, or cutting).  Poor strength and flexibility.  Improperly fitted shoes.  Tight calf muscles.  Flat feet.  Failure to warm-up properly before activity.  Obesity. PREVENTION  Warm up and stretch properly before activity.  Allow for adequate recovery between workouts.  Maintain physical fitness:  Strength, flexibility, and endurance.  Cardiovascular fitness.  Maintain a health body weight.  Avoid stress on the plantar fascia.  Wear properly fitted shoes, including arch supports for individuals who have flat feet. PROGNOSIS  If treated properly, then the symptoms of plantar fasciitis usually resolve without surgery. However, occasionally surgery is necessary. RELATED COMPLICATIONS   Recurrent symptoms that may result in a chronic condition.  Problems of the lower back that are caused by compensating for the injury, such as limping.  Pain or weakness of the foot during push-off following surgery.  Chronic inflammation, scarring, and partial or complete fascia tear, occurring more often from repeated injections. TREATMENT  Treatment initially involves the use of  ice and medication to help reduce pain and inflammation. The use of strengthening and stretching exercises may help reduce pain with activity, especially stretches of the Achilles tendon. These exercises may be performed at home or with a therapist. Your caregiver may recommend that you use heel cups of arch supports to help reduce stress on the plantar fascia. Occasionally, corticosteroid injections are given to reduce inflammation. If symptoms persist for greater than 6 months despite non-surgical (conservative), then surgery may be recommended.  MEDICATION   If pain medication is necessary, then nonsteroidal anti-inflammatory medications, such as aspirin and ibuprofen, or other minor pain relievers, such as acetaminophen, are often recommended.  Do not take pain medication within 7 days before surgery.  Prescription pain relievers may be given if deemed necessary by your caregiver. Use only as directed and only as much as you need.  Corticosteroid injections may be given by your caregiver. These injections should be reserved for the most serious cases, because they may only be given a certain number of times. HEAT AND COLD  Cold treatment (icing) relieves pain and reduces inflammation. Cold treatment should be applied for 10 to 15 minutes every 2 to 3 hours for inflammation and pain and immediately after any activity that aggravates your symptoms. Use ice packs or massage the area with a piece of ice (ice massage).  Heat treatment may be used prior to performing the stretching and strengthening activities prescribed by your caregiver, physical therapist, or athletic trainer. Use a heat pack or soak the injury in warm water. SEEK IMMEDIATE MEDICAL CARE IF:  Treatment seems to offer no benefit, or the condition worsens.  Any medications produce adverse side effects. EXERCISES RANGE   OF MOTION (ROM) AND STRETCHING EXERCISES - Plantar Fasciitis (Heel Spur Syndrome) These exercises may help you  when beginning to rehabilitate your injury. Your symptoms may resolve with or without further involvement from your physician, physical therapist or athletic trainer. While completing these exercises, remember:   Restoring tissue flexibility helps normal motion to return to the joints. This allows healthier, less painful movement and activity.  An effective stretch should be held for at least 30 seconds.  A stretch should never be painful. You should only feel a gentle lengthening or release in the stretched tissue. RANGE OF MOTION - Toe Extension, Flexion  Sit with your right / left leg crossed over your opposite knee.  Grasp your toes and gently pull them back toward the top of your foot. You should feel a stretch on the bottom of your toes and/or foot.  Hold this stretch for __________ seconds.  Now, gently pull your toes toward the bottom of your foot. You should feel a stretch on the top of your toes and or foot.  Hold this stretch for __________ seconds. Repeat __________ times. Complete this stretch __________ times per day.  RANGE OF MOTION - Ankle Dorsiflexion, Active Assisted  Remove shoes and sit on a chair that is preferably not on a carpeted surface.  Place right / left foot under knee. Extend your opposite leg for support.  Keeping your heel down, slide your right / left foot back toward the chair until you feel a stretch at your ankle or calf. If you do not feel a stretch, slide your bottom forward to the edge of the chair, while still keeping your heel down.  Hold this stretch for __________ seconds. Repeat __________ times. Complete this stretch __________ times per day.  STRETCH  Gastroc, Standing  Place hands on wall.  Extend right / left leg, keeping the front knee somewhat bent.  Slightly point your toes inward on your back foot.  Keeping your right / left heel on the floor and your knee straight, shift your weight toward the wall, not allowing your back to  arch.  You should feel a gentle stretch in the right / left calf. Hold this position for __________ seconds. Repeat __________ times. Complete this stretch __________ times per day. STRETCH  Soleus, Standing  Place hands on wall.  Extend right / left leg, keeping the other knee somewhat bent.  Slightly point your toes inward on your back foot.  Keep your right / left heel on the floor, bend your back knee, and slightly shift your weight over the back leg so that you feel a gentle stretch deep in your back calf.  Hold this position for __________ seconds. Repeat __________ times. Complete this stretch __________ times per day. STRETCH  Gastrocsoleus, Standing  Note: This exercise can place a lot of stress on your foot and ankle. Please complete this exercise only if specifically instructed by your caregiver.   Place the ball of your right / left foot on a step, keeping your other foot firmly on the same step.  Hold on to the wall or a rail for balance.  Slowly lift your other foot, allowing your body weight to press your heel down over the edge of the step.  You should feel a stretch in your right / left calf.  Hold this position for __________ seconds.  Repeat this exercise with a slight bend in your right / left knee. Repeat __________ times. Complete this stretch __________ times per day.    STRENGTHENING EXERCISES - Plantar Fasciitis (Heel Spur Syndrome)  These exercises may help you when beginning to rehabilitate your injury. They may resolve your symptoms with or without further involvement from your physician, physical therapist or athletic trainer. While completing these exercises, remember:   Muscles can gain both the endurance and the strength needed for everyday activities through controlled exercises.  Complete these exercises as instructed by your physician, physical therapist or athletic trainer. Progress the resistance and repetitions only as guided. STRENGTH - Towel  Curls  Sit in a chair positioned on a non-carpeted surface.  Place your foot on a towel, keeping your heel on the floor.  Pull the towel toward your heel by only curling your toes. Keep your heel on the floor.  If instructed by your physician, physical therapist or athletic trainer, add ____________________ at the end of the towel. Repeat __________ times. Complete this exercise __________ times per day. STRENGTH - Ankle Inversion  Secure one end of a rubber exercise band/tubing to a fixed object (table, pole). Loop the other end around your foot just before your toes.  Place your fists between your knees. This will focus your strengthening at your ankle.  Slowly, pull your big toe up and in, making sure the band/tubing is positioned to resist the entire motion.  Hold this position for __________ seconds.  Have your muscles resist the band/tubing as it slowly pulls your foot back to the starting position. Repeat __________ times. Complete this exercises __________ times per day.  Document Released: 09/27/2005 Document Revised: 12/20/2011 Document Reviewed: 01/09/2009 [employer] Patient Information 2014 [employer]. Plantar Fasciitis Plantar fasciitis is a common condition that causes foot pain. It is soreness (inflammation) of the band of tough fibrous tissue on the bottom of the foot that runs from the heel bone (calcaneus) to the ball of the foot. The cause of this soreness may be from excessive standing, poor fitting shoes, running on hard surfaces, being overweight, having an abnormal walk, or overuse (this is common in runners) of the painful foot or feet. It is also common in aerobic exercise dancers and ballet dancers. SYMPTOMS  Most people with plantar fasciitis complain of:  Severe pain in the morning on the bottom of their foot especially when taking the first steps out of bed. This pain recedes after a few minutes of walking.  Severe pain is experienced also during walking  following a long period of inactivity.  Pain is worse when walking barefoot or up stairs DIAGNOSIS   Your caregiver will diagnose this condition by examining and feeling your foot.  Special tests such as X-rays of your foot, are usually not needed. PREVENTION   Consult a sports medicine professional before beginning a new exercise program.  Walking programs offer a good workout. With walking there is a lower chance of overuse injuries common to runners. There is less impact and less jarring of the joints.  Begin all new exercise programs slowly. If problems or pain develop, decrease the amount of time or distance until you are at a comfortable level.  Wear good shoes and replace them regularly.  Stretch your foot and the heel cords at the back of the ankle (Achilles tendon) both before and after exercise.  Run or exercise on even surfaces that are not hard. For example, asphalt is better than pavement.  Do not run barefoot on hard surfaces.  If using a treadmill, vary the incline.  Do not continue to workout if you have foot or joint   problems. Seek professional help if they do not improve. HOME CARE INSTRUCTIONS   Avoid activities that cause you pain until you recover.  Use ice or cold packs on the problem or painful areas after working out.  Only take over-the-counter or prescription medicines for pain, discomfort, or fever as directed by your caregiver.  Soft shoe inserts or athletic shoes with air or gel sole cushions may be helpful.  If problems continue or become more severe, consult a sports medicine caregiver or your own health care provider. Cortisone is a potent anti-inflammatory medication that may be injected into the painful area. You can discuss this treatment with your caregiver. MAKE SURE YOU:   Understand these instructions.  Will watch your condition.  Will get help right away if you are not doing well or get worse. Document Released: 06/22/2001 Document  Revised: 12/20/2011 Document Reviewed: 08/21/2008 [facility] Patient Information 2014 [place], [place]. Plantar Fasciitis (Heel Spur Syndrome) with Rehab The plantar fascia is a fibrous, ligament-like, soft-tissue structure that spans the bottom of the foot. Plantar fasciitis is a condition that causes pain in the foot due to inflammation of the tissue. SYMPTOMS   Pain and tenderness on the underneath side of the foot.  Pain that worsens with standing or walking. CAUSES  Plantar fasciitis is caused by irritation and injury to the plantar fascia on the underneath side of the foot. Common mechanisms of injury include:  Direct trauma to bottom of the foot.  Damage to a small nerve that runs under the foot where the main fascia attaches to the heel bone.  Stress placed on the plantar fascia due to bone spurs. RISK INCREASES WITH:   Activities that place stress on the plantar fascia (running, jumping, pivoting, or cutting).  Poor strength and flexibility.  Improperly fitted shoes.  Tight calf muscles.  Flat feet.  Failure to warm-up properly before activity.  Obesity. PREVENTION  Warm up and stretch properly before activity.  Allow for adequate recovery between workouts.  Maintain physical fitness:  Strength, flexibility, and endurance.  Cardiovascular fitness.  Maintain a health body weight.  Avoid stress on the plantar fascia.  Wear properly fitted shoes, including arch supports for individuals who have flat feet.  PROGNOSIS  If treated properly, then the symptoms of plantar fasciitis usually resolve without surgery. However, occasionally surgery is necessary.  RELATED COMPLICATIONS   Recurrent symptoms that may result in a chronic condition.  Problems of the lower back that are caused by compensating for the injury, such as limping.  Pain or weakness of the foot during push-off following surgery.  Chronic inflammation, scarring, and partial or complete fascia  tear, occurring more often from repeated injections.  TREATMENT  Treatment initially involves the use of ice and medication to help reduce pain and inflammation. The use of strengthening and stretching exercises may help reduce pain with activity, especially stretches of the Achilles tendon. These exercises may be performed at home or with a therapist. Your caregiver may recommend that you use heel cups of arch supports to help reduce stress on the plantar fascia. Occasionally, corticosteroid injections are given to reduce inflammation. If symptoms persist for greater than 6 months despite non-surgical (conservative), then surgery may be recommended.   MEDICATION   If pain medication is necessary, then nonsteroidal anti-inflammatory medications, such as aspirin and ibuprofen, or other minor pain relievers, such as acetaminophen, are often recommended.  Do not take pain medication within 7 days before surgery.  Prescription pain relievers may be given  if deemed necessary by your caregiver. Use only as directed and only as much as you need.  Corticosteroid injections may be given by your caregiver. These injections should be reserved for the most serious cases, because they may only be given a certain number of times.  HEAT AND COLD  Cold treatment (icing) relieves pain and reduces inflammation. Cold treatment should be applied for 10 to 15 minutes every 2 to 3 hours for inflammation and pain and immediately after any activity that aggravates your symptoms. Use ice packs or massage the area with a piece of ice (ice massage).  Heat treatment may be used prior to performing the stretching and strengthening activities prescribed by your caregiver, physical therapist, or athletic trainer. Use a heat pack or soak the injury in warm water.  SEEK IMMEDIATE MEDICAL CARE IF:  Treatment seems to offer no benefit, or the condition worsens.  Any medications produce adverse side  effects.  EXERCISES- RANGE OF MOTION (ROM) AND STRETCHING EXERCISES - Plantar Fasciitis (Heel Spur Syndrome) These exercises may help you when beginning to rehabilitate your injury. Your symptoms may resolve with or without further involvement from your physician, physical therapist or athletic trainer. While completing these exercises, remember:   Restoring tissue flexibility helps normal motion to return to the joints. This allows healthier, less painful movement and activity.  An effective stretch should be held for at least 30 seconds.  A stretch should never be painful. You should only feel a gentle lengthening or release in the stretched tissue.  RANGE OF MOTION - Toe Extension, Flexion  Sit with your right / left leg crossed over your opposite knee.  Grasp your toes and gently pull them back toward the top of your foot. You should feel a stretch on the bottom of your toes and/or foot.  Hold this stretch for 10 seconds.  Now, gently pull your toes toward the bottom of your foot. You should feel a stretch on the top of your toes and or foot.  Hold this stretch for 10 seconds. Repeat  times. Complete this stretch 3 times per day.   RANGE OF MOTION - Ankle Dorsiflexion, Active Assisted  Remove shoes and sit on a chair that is preferably not on a carpeted surface.  Place right / left foot under knee. Extend your opposite leg for support.  Keeping your heel down, slide your right / left foot back toward the chair until you feel a stretch at your ankle or calf. If you do not feel a stretch, slide your bottom forward to the edge of the chair, while still keeping your heel down.  Hold this stretch for 10 seconds. Repeat 3 times. Complete this stretch 2 times per day.   STRETCH  Gastroc, Standing  Place hands on wall.  Extend right / left leg, keeping the front knee somewhat bent.  Slightly point your toes inward on your back foot.  Keeping your right / left heel on the floor  and your knee straight, shift your weight toward the wall, not allowing your back to arch.  You should feel a gentle stretch in the right / left calf. Hold this position for 10 seconds. Repeat 3 times. Complete this stretch 2 times per day.  STRETCH  Soleus, Standing  Place hands on wall.  Extend right / left leg, keeping the other knee somewhat bent.  Slightly point your toes inward on your back foot.  Keep your right / left heel on the floor, bend your back  knee, and slightly shift your weight over the back leg so that you feel a gentle stretch deep in your back calf.  Hold this position for 10 seconds. Repeat 3 times. Complete this stretch 2 times per day.  STRETCH  Gastrocsoleus, Standing  Note: This exercise can place a lot of stress on your foot and ankle. Please complete this exercise only if specifically instructed by your caregiver.   Place the ball of your right / left foot on a step, keeping your other foot firmly on the same step.  Hold on to the wall or a rail for balance.  Slowly lift your other foot, allowing your body weight to press your heel down over the edge of the step.  You should feel a stretch in your right / left calf.  Hold this position for 10 seconds.  Repeat this exercise with a slight bend in your right / left knee. Repeat 3 times. Complete this stretch 2 times per day.   STRENGTHENING EXERCISES - Plantar Fasciitis (Heel Spur Syndrome)  These exercises may help you when beginning to rehabilitate your injury. They may resolve your symptoms with or without further involvement from your physician, physical therapist or athletic trainer. While completing these exercises, remember:   Muscles can gain both the endurance and the strength needed for everyday activities through controlled exercises.  Complete these exercises as instructed by your physician, physical therapist or athletic trainer. Progress the resistance and repetitions only as  guided.  STRENGTH - Towel Curls  Sit in a chair positioned on a non-carpeted surface.  Place your foot on a towel, keeping your heel on the floor.  Pull the towel toward your heel by only curling your toes. Keep your heel on the floor. Repeat 3 times. Complete this exercise 2 times per day.  STRENGTH - Ankle Inversion  Secure one end of a rubber exercise band/tubing to a fixed object (table, pole). Loop the other end around your foot just before your toes.  Place your fists between your knees. This will focus your strengthening at your ankle.  Slowly, pull your big toe up and in, making sure the band/tubing is positioned to resist the entire motion.  Hold this position for 10 seconds.  Have your muscles resist the band/tubing as it slowly pulls your foot back to the starting position. Repeat 3 times. Complete this exercises 2 times per day.  Document Released: 09/27/2005 Document Revised: 12/20/2011 Document Reviewed: 01/09/2009 [facility] Patient Information 2014 [place], [place].

## 2014-01-18 NOTE — Progress Notes (Signed)
   Subjective:    Patient ID: [name], male    DOB: 1981/01/19, 33 y.o.   MRN: [id number]  HPI Comments: Having some left foot pain, left heel, around the sides. Heel pain has been several months, tried to frozen water bottle. Diabetes and last a1c was high. Blood sugar two days ago was 210 fasting   Foot Pain Associated symptoms include numbness and weakness.      Review of Systems  Eyes: Positive for visual disturbance.  Respiratory: Positive for shortness of breath.   Endocrine:       Diabetes   Musculoskeletal:       Joint pain  Muscle pain   Neurological: Positive for weakness and numbness.  Psychiatric/Behavioral: Positive for confusion. The patient is nervous/anxious.   All other systems reviewed and are negative.      Objective:   Physical Exam        Assessment & Plan:

## 2014-01-18 NOTE — Progress Notes (Signed)
Subjective:     Patient ID: [name], male   DOB: [birth year], 33 y.o.   MRN: [id number]  Foot Pain   patient presents with pain to the plantar heel left at the insertion of the tendon into the calcaneus with inflammation and fluid buildup noted. States it's been present for approximately 4 months   Review of Systems  All other systems reviewed and are negative.      Objective:   Physical Exam  Nursing note and vitals reviewed. Constitutional: He is oriented to person, place, and time.  Cardiovascular: Intact distal pulses.   Musculoskeletal: Normal range of motion.  Neurological: He is oriented to person, place, and time.  Skin: Skin is warm.   neurovascular status intact with health history unchanged and discomfort in the plantar heel region left at the insertion of the tendon the calcaneus with fluid buildup noted     Assessment:     Plantar fasciitis left heel with inflammation and fluid at the insertion    Plan:     H&P and x-rays reviewed and conditions discussed. Injected the plantar fascia 3 mg Kenalog 5 mg I can Marcaine mixture and dispensed fascially brace with instructions on usage reappoint her recheck

## 2014-01-25 ENCOUNTER — Ambulatory Visit: Payer: Commercial Managed Care - HMO | Admitting: Podiatry

## 2014-02-08 ENCOUNTER — Ambulatory Visit: Payer: Commercial Managed Care - HMO | Admitting: Podiatry

## 2014-02-12 ENCOUNTER — Ambulatory Visit: Payer: Medicare HMO | Admitting: Adult Health

## 2014-02-13 ENCOUNTER — Ambulatory Visit (INDEPENDENT_AMBULATORY_CARE_PROVIDER_SITE_OTHER): Payer: Commercial Managed Care - HMO | Admitting: Adult Health

## 2014-02-13 ENCOUNTER — Encounter: Payer: Self-pay | Admitting: Adult Health

## 2014-02-13 VITALS — BP 122/76 | HR 98 | Temp 97.5°F | Resp 14 | Wt 238.5 lb

## 2014-02-13 DIAGNOSIS — E118 Type 2 diabetes mellitus with unspecified complications: Principal | ICD-10-CM

## 2014-02-13 DIAGNOSIS — E1165 Type 2 diabetes mellitus with hyperglycemia: Secondary | ICD-10-CM

## 2014-02-13 DIAGNOSIS — Z598 Other problems related to housing and economic circumstances: Secondary | ICD-10-CM

## 2014-02-13 DIAGNOSIS — H3581 Retinal edema: Secondary | ICD-10-CM

## 2014-02-13 DIAGNOSIS — Z599 Problem related to housing and economic circumstances, unspecified: Secondary | ICD-10-CM

## 2014-02-13 DIAGNOSIS — Z5987 Material hardship: Secondary | ICD-10-CM

## 2014-02-13 DIAGNOSIS — IMO0002 Reserved for concepts with insufficient information to code with codable children: Secondary | ICD-10-CM

## 2014-02-13 MED ORDER — INSULIN DETEMIR 100 UNIT/ML FLEXPEN: 10.0000 [IU] | PEN_INJECTOR | Freq: Every day | SUBCUTANEOUS | Status: DC

## 2014-02-13 MED ORDER — LINAGLIPTIN-METFORMIN HCL 2.5-500 MG PO TABS: 1.0000 | ORAL_TABLET | Freq: Every day | ORAL | Status: DC

## 2014-02-13 NOTE — Progress Notes (Signed)
Pre visit review using our clinic review tool, if applicable. No additional management support is needed unless otherwise documented below in the visit note. 

## 2014-02-13 NOTE — Progress Notes (Signed)
Patient ID: [name], male   DOB: 10/06/1981, 33 y.o.   MRN: [id number]   Subjective:    Patient ID: [name], male    DOB: 10/06/1981, 33 y.o.   MRN: [id number]  HPI  Patient is a very pleasant 33 year old male with multiple medical problems including uncontrolled diabetes type 2, coronary artery disease, hypertension, hyperlipidemia who presents to clinic for followup. I had provided him insulin samples during previous visit. He reports that he has not been using the insulin consistently because he forgets. He has attending diabetic education at [facility] and reports that he has been trying to watch his diet. Another problem that [name] has is that he does not have scheduled eating times. He reports eating at erratic times which also complicates any use of short acting insulin.  He is requesting that we give him a long acting insulin that he just needs to administer once a day for better compliance. He is checking his BG levels twice a day. Reports AM range between 210 - 215 and the PM ranges between 250 - 300.  [name] recently had his diabetic eye exam which revealed macular edema. He has an appointment with [facility] in [place]. I will need to provide him with a referral.  Complicating matters further is [name]'s financial situation. He reports that he cannot purchase his insulin for another week. 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   Past Medical History  Diagnosis Date  . Coronary atherosclerosis of artery bypass graft     Age 33 - [place]  . Chest pain, unspecified   . Uncontrolled diabetes mellitus with complications 2002    poorly controlled  . HLD (hyperlipidemia)     poorly controlled  . HTN (hypertension)   . Obesity, unspecified   . Hx-TIA (transient ischemic attack) 2009    possible-([facility]) Normal MRI of brain and MRA of the head and neck  . Noncompliance     Current Outpatient  Prescriptions on File Prior to Visit  Medication Sig Dispense Refill  . aspirin 162 MG EC tablet Take 162 mg by mouth daily.      . metoprolol tartrate (LOPRESSOR) 25 MG tablet Take 1 tablet (25 mg total) by mouth 2 (two) times daily.  60 tablet  11  . rosuvastatin (CRESTOR) 20 MG tablet Take 20 mg by mouth daily.       No current facility-administered medications on file prior to visit.     Review of Systems  Constitutional:       Not eating at scheduled times. Goes hours without eating. Started exercising  HENT: Negative.   Eyes: Positive for visual disturbance.       Recently examined by Dr. Marti Sleighhurman. Macular Edema. He has been referred to a specialist.   Respiratory: Negative.   Cardiovascular: Negative.  Negative for chest pain, palpitations and leg swelling.  Gastrointestinal: Negative.   Endocrine:       No reported hypoglycemic events  Genitourinary: Negative.   Musculoskeletal: Negative.   Skin: Negative.   Allergic/Immunologic: Negative.   Neurological: Negative.   Hematological: Negative.   Psychiatric/Behavioral: Negative.        Objective:  BP 122/76  Pulse 98  Temp(Src) 97.5 F (36.4 C) (Oral)  Resp 14  Wt 238 lb 8 oz (108.183 kg)  SpO2 97%   Physical Exam  Constitutional: He is oriented to person, place,  and time. No distress.  HENT:  Head: Normocephalic and atraumatic.  Eyes: Conjunctivae and EOM are normal.  Neck: Neck supple.  Cardiovascular: Normal rate and regular rhythm.   Pulmonary/Chest: Effort normal. No respiratory distress.  Musculoskeletal: Normal range of motion.  Neurological: He is alert and oriented to person, place, and time. Coordination normal.  Skin: Skin is warm and dry.  Psychiatric: He has a normal mood and affect. His behavior is normal. Judgment and thought content normal.       Assessment & Plan:   1. Diabetes mellitus type 2 with complications, uncontrolled Will start with Levemir. Discussed increasing insulin by 3  units every 3 days until we get his am blood glucose readings below 150. I have also given him samples of linagliptin-metformin to take daily. May increase to bid if blood glucose still not well controlled. Pt reports that he will not be able to purchase his insulin for another week which is why I gave him the samples. I have also asked him to continue to take the po medication when he start levemir to see if this will help his blood glucose. Needs to bring me his blood glucose levels in 2 weeks for evaluation.   2. Macular edema He has been [place] - Ambulatory referral to Ophthalmology  3. Financial difficulties This is a very difficult case. He wants to try his best but somewhat limited 2/2 finances. I have discussed a Child psychotherapistsocial worker in the past but I do not think he was to enthusiastic about this. He will contact the pharmaceutical companies to see if they have any other drug assistance programs. I provided him with a discount card for the linagliptin-metformin where he will only have to pay $10/month

## 2014-02-13 NOTE — Patient Instructions (Signed)
  Start Levemir 10 units in the morning.  Monitor your blood glucose levels in the morning and before bedtime.  I would like your morning blood glucose levels to be below 150. You can increase the levemir by 3 units every 3 days.  Take Jentadueto 1 tablet in the morning.  Bring me your blood glucose reading for two weeks. You can drop them off at the front desk.

## 2014-03-01 ENCOUNTER — Ambulatory Visit: Payer: Commercial Managed Care - HMO | Admitting: Podiatry

## 2014-03-05 ENCOUNTER — Ambulatory Visit (INDEPENDENT_AMBULATORY_CARE_PROVIDER_SITE_OTHER): Payer: Commercial Managed Care - HMO

## 2014-03-05 ENCOUNTER — Ambulatory Visit (INDEPENDENT_AMBULATORY_CARE_PROVIDER_SITE_OTHER): Payer: Commercial Managed Care - HMO | Admitting: Podiatry

## 2014-03-05 VITALS — BP 131/80 | HR 89 | Resp 16

## 2014-03-05 DIAGNOSIS — M722 Plantar fascial fibromatosis: Secondary | ICD-10-CM

## 2014-03-05 MED ORDER — TRIAMCINOLONE ACETONIDE 10 MG/ML IJ SUSP
10.0000 mg | Freq: Once | INTRAMUSCULAR | Status: AC
  Administered 2014-03-05: 10 mg

## 2014-03-05 NOTE — Progress Notes (Signed)
Subjective:     Patient ID: [name], male   DOB: 1981-09-19, 34 y.o.   MRN: [id number]  HPI patient states that both heels have been hurting him and making it hard to walk   Review of Systems     Objective:   Physical Exam Neurovascular status intact with no changes in health history and discomfort in the medial band of the plantar fascia both heels with palpation    Assessment:     Continued plantar fasciitis of the heel region both feet    Plan:     Reviewed injection treatment which was accomplished today with patient having no issues with this and told him to watch his sugar more carefully and also advised on supportive shoes to try to take pressure off his feet reappoint as needed

## 2014-03-10 ENCOUNTER — Emergency Department: Payer: Self-pay | Admitting: Internal Medicine

## 2014-03-10 LAB — CBC
HCT: 46 % (ref 40.0–52.0)
HGB: 15.6 g/dL (ref 13.0–18.0)
MCH: 29.4 pg (ref 26.0–34.0)
MCHC: 33.8 g/dL (ref 32.0–36.0)
MCV: 87 fL (ref 80–100)
Platelet: 237 10*3/uL (ref 150–440)
RBC: 5.29 10*6/uL (ref 4.40–5.90)
RDW: 13 % (ref 11.5–14.5)
WBC: 7.2 10*3/uL (ref 3.8–10.6)

## 2014-03-10 LAB — BASIC METABOLIC PANEL
Anion Gap: 8 (ref 7–16)
BUN: 12 mg/dL (ref 7–18)
CREATININE: 0.57 mg/dL — AB (ref 0.60–1.30)
Calcium, Total: 8.6 mg/dL (ref 8.5–10.1)
Chloride: 105 mmol/L (ref 98–107)
Co2: 25 mmol/L (ref 21–32)
EGFR (African American): >60
EGFR (Non-African Amer.): >60
GLUCOSE: 300 mg/dL — AB (ref 65–99)
OSMOLALITY: 287 (ref 275–301)
POTASSIUM: 3.9 mmol/L (ref 3.5–5.1)
SODIUM: 138 mmol/L (ref 136–145)

## 2014-03-10 LAB — TROPONIN I
Troponin-I: <0.02 ng/mL
Troponin-I: <0.02 ng/mL

## 2014-03-13 ENCOUNTER — Telehealth: Payer: Self-pay | Admitting: Adult Health

## 2014-03-13 NOTE — Telephone Encounter (Signed)
Patient dropped off a paper work to be filled out for school. Paper work needs information on last physical. Please call pt when paper is ready to be picked up./msn

## 2014-03-13 NOTE — Telephone Encounter (Signed)
Paperwork received.

## 2014-03-31 ENCOUNTER — Emergency Department: Payer: Self-pay | Admitting: Emergency Medicine

## 2014-03-31 LAB — COMPREHENSIVE METABOLIC PANEL
Albumin: 3.3 g/dL — ABNORMAL LOW (ref 3.4–5.0)
Alkaline Phosphatase: 47 U/L
Anion Gap: 10 (ref 7–16)
BUN: 16 mg/dL (ref 7–18)
Bilirubin,Total: 0.6 mg/dL (ref 0.2–1.0)
Calcium, Total: 8.6 mg/dL (ref 8.5–10.1)
Chloride: 101 mmol/L (ref 98–107)
Co2: 24 mmol/L (ref 21–32)
Creatinine: 0.97 mg/dL (ref 0.60–1.30)
EGFR (African American): >60
EGFR (Non-African Amer.): >60
Glucose: 436 mg/dL — ABNORMAL HIGH (ref 65–99)
Osmolality: 290 (ref 275–301)
Potassium: 4.6 mmol/L (ref 3.5–5.1)
SGOT(AST): 27 U/L (ref 15–37)
SGPT (ALT): 26 U/L (ref 12–78)
Sodium: 135 mmol/L — ABNORMAL LOW (ref 136–145)
Total Protein: 6.7 g/dL (ref 6.4–8.2)

## 2014-03-31 LAB — DRUG SCREEN, URINE

## 2014-03-31 LAB — CBC
HCT: 50.7 % (ref 40.0–52.0)
HGB: 16.5 g/dL (ref 13.0–18.0)
MCH: 28.7 pg (ref 26.0–34.0)
MCHC: 32.5 g/dL (ref 32.0–36.0)
MCV: 89 fL (ref 80–100)
PLATELETS: 275 10*3/uL (ref 150–440)
RBC: 5.72 10*6/uL (ref 4.40–5.90)
RDW: 12.7 % (ref 11.5–14.5)
WBC: 10 10*3/uL (ref 3.8–10.6)

## 2014-03-31 LAB — URINALYSIS, COMPLETE
Bacteria: NONE SEEN
Bilirubin,UR: NEGATIVE
Blood: NEGATIVE
Glucose,UR: >=500 mg/dL (ref 0–75)
Hyaline Cast: 5
Leukocyte Esterase: NEGATIVE
Nitrite: NEGATIVE
Ph: 5 (ref 4.5–8.0)
Protein: NEGATIVE
RBC,UR: <1 /HPF (ref 0–5)
Specific Gravity: 1.037 (ref 1.003–1.030)
Squamous Epithelial: NONE SEEN
WBC UR: 1 /HPF (ref 0–5)

## 2014-03-31 LAB — TROPONIN I: Troponin-I: <0.02 ng/mL

## 2014-03-31 LAB — LIPASE, BLOOD: Lipase: 212 U/L (ref 73–393)

## 2014-04-03 ENCOUNTER — Ambulatory Visit (INDEPENDENT_AMBULATORY_CARE_PROVIDER_SITE_OTHER): Payer: Commercial Managed Care - HMO | Admitting: Adult Health

## 2014-04-03 ENCOUNTER — Encounter: Payer: Self-pay | Admitting: Adult Health

## 2014-04-03 VITALS — BP 120/77 | HR 94 | Temp 97.9°F | Resp 14 | Wt 236.2 lb

## 2014-04-03 DIAGNOSIS — E1165 Type 2 diabetes mellitus with hyperglycemia: Secondary | ICD-10-CM

## 2014-04-03 DIAGNOSIS — R5383 Other fatigue: Secondary | ICD-10-CM

## 2014-04-03 DIAGNOSIS — E118 Type 2 diabetes mellitus with unspecified complications: Principal | ICD-10-CM

## 2014-04-03 DIAGNOSIS — IMO0002 Reserved for concepts with insufficient information to code with codable children: Secondary | ICD-10-CM

## 2014-04-03 DIAGNOSIS — R5381 Other malaise: Secondary | ICD-10-CM

## 2014-04-03 LAB — BASIC METABOLIC PANEL
BUN: 10 mg/dL (ref 6–23)
CALCIUM: 9.2 mg/dL (ref 8.4–10.5)
CO2: 27 mEq/L (ref 19–32)
Chloride: 102 mEq/L (ref 96–112)
Creatinine, Ser: 0.7 mg/dL (ref 0.4–1.5)
GFR: 136.93 mL/min (ref >60.00)
GLUCOSE: 294 mg/dL — AB (ref 70–99)
Potassium: 4.1 mEq/L (ref 3.5–5.1)
SODIUM: 137 meq/L (ref 135–145)

## 2014-04-03 LAB — TSH: TSH: 0.65 u[IU]/mL (ref 0.35–4.50)

## 2014-04-03 LAB — HEMOGLOBIN A1C: Hgb A1c MFr Bld: 12.7 % — ABNORMAL HIGH (ref 4.6–6.5)

## 2014-04-03 NOTE — Progress Notes (Signed)
Patient ID: [name], male   DOB: July 05, 1981, 33 y.o.   MRN: [id number]   Subjective:    Patient ID: [name], male    DOB: July 05, 1981, 33 y.o.   MRN: [id number]  HPI Pt is a pleasant 33 y/o male who presents to clinic with concerns about not being able to afford his levemir. It has been a challenge to be able to find affordable medication for [name]. I have asked him to contact his insurance company and perhaps they can give him some feedback on what diabetic medication they will cover.  He reports that he passed out on Sunday morning. Doesn't remember anything about the events only that he felt very tired. Next thing he remembers is that he was in the ED. He reports that they told him he was dehydrated. He has been working mowing lawns. He does this first thing in the morning when it is not too hot. He also reports that he drinks fluids. I am requesting labs from ED visit to see if any need to be followed up on. Pt is not able to give me any specifics.  Inquires about going back to work. He is on disability for his cardiac hx. 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 He reports that his wife is concerned about him returning to work and that he be asked to lift heavy object because he is a Materials engineer"husky fellow". I explained that he would need to find work that would not involved lifting heavy objects. 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 He is pleasant and, I think, he would do well in one of these settings. He is concerned about his anxiety. Talking about going back to work is making him anxious. I explained that we may be able to control his symptoms with medication (SSRI or antianxiety medication such as buspirone). I think that going back to work will help him with his overall sense of well being. He is going to schedule a follow up  appt with his cardiologist and I asked [name] to get his opinion on his returning to the work force.   Past Medical History  Diagnosis Date  . Coronary atherosclerosis of artery bypass graft     Age 33 -   . Chest pain, unspecified   . Uncontrolled diabetes mellitus with complications 2002    poorly controlled  . HLD (hyperlipidemia)     poorly controlled  . HTN (hypertension)   . Obesity, unspecified   . Hx-TIA (transient ischemic attack) 2009    possible-([facility]) Normal MRI of brain and MRA of the head and neck  . Noncompliance     Current Outpatient Prescriptions on File Prior to Visit  Medication Sig Dispense Refill  . aspirin 162 MG EC tablet Take 162 mg by mouth daily.      . Linagliptin-Metformin HCl 2.5-500 MG TABS Take 1 tablet by mouth daily.  30 tablet  0  . metoprolol tartrate (LOPRESSOR) 25 MG tablet Take 1 tablet (25 mg total) by mouth 2 (two) times daily.  60 tablet  11  . rosuvastatin (CRESTOR) 20 MG tablet Take 20 mg by mouth daily.       No current facility-administered medications on file prior to visit.     Review of Systems  Constitutional: Positive for fatigue.  HENT: Negative.  Eyes: Negative.   Respiratory: Negative.  Negative for chest tightness, shortness of breath and wheezing.   Cardiovascular: Negative.  Negative for chest pain and leg swelling.  Gastrointestinal: Negative.   Endocrine: Negative.   Genitourinary: Negative.   Musculoskeletal: Negative.   Skin: Negative.   Allergic/Immunologic: Negative.   Neurological: Positive for syncope.       Recent episode of passing out at home and [facility]. They told him he was dehydrated.  Hematological: Negative.   Psychiatric/Behavioral: Negative.   All other systems reviewed and are negative.      Objective:  BP 120/77  Pulse 94  Temp(Src) 97.9 F (36.6 C) (Oral)  Resp 14  Wt 236 lb 4 oz (107.162 kg)  SpO2 98%   Physical Exam  Constitutional: He is oriented to person,  place, and time. He appears well-developed and well-nourished. No distress.  HENT:  Head: Normocephalic and atraumatic.  Eyes: Conjunctivae and EOM are normal.  Cardiovascular: Normal rate, regular rhythm, normal heart sounds and intact distal pulses.  Exam reveals no gallop and no friction rub.   No murmur heard. Pulmonary/Chest: Effort normal and breath sounds normal. No respiratory distress. He has no wheezes. He has no rales.  Musculoskeletal: Normal range of motion.  Neurological: He is alert and oriented to person, place, and time.  Skin: Skin is warm and dry.  Psychiatric: He has a normal mood and affect. His behavior is normal. Judgment and thought content normal.      Assessment & Plan:   1. Diabetes mellitus type 2 with complications, uncontrolled Cannot afford levemir. I have had a very difficult time getting him medication that he can afford. Extensive cardiac hx. Diabetes is out of control. He is on disability. We have contacted [employer] for medication assistance. Pt provided with information to contact and set up appointment with them. Currently on Tradjenta which he is able to get because of a discount card. I am checking his A1c which I suspect is not therapeutic.  - Basic metabolic panel - Hemoglobin A1c  2. Other fatigue Recent episode of hospital ED visit for passing out. He was told he was dehydrated but labs do not support this. I am repeating a bmet and check his thyroid as well. CBC was normal.  - TSH

## 2014-04-03 NOTE — Progress Notes (Signed)
Pre visit review using our clinic review tool, if applicable. No additional management support is needed unless otherwise documented below in the visit note. 

## 2014-04-04 ENCOUNTER — Encounter: Payer: Self-pay | Admitting: *Deleted

## 2014-05-16 ENCOUNTER — Ambulatory Visit (INDEPENDENT_AMBULATORY_CARE_PROVIDER_SITE_OTHER): Payer: Commercial Managed Care - HMO | Admitting: Adult Health

## 2014-05-16 ENCOUNTER — Encounter: Payer: Self-pay | Admitting: Adult Health

## 2014-05-16 ENCOUNTER — Ambulatory Visit (INDEPENDENT_AMBULATORY_CARE_PROVIDER_SITE_OTHER)
Admission: RE | Admit: 2014-05-16 | Discharge: 2014-05-16 | Disposition: A | Discharge status: Home / Self Care | Payer: Commercial Managed Care - HMO | Source: Ambulatory Visit | Attending: Adult Health | Admitting: Adult Health

## 2014-05-16 VITALS — BP 117/77 | HR 93 | Temp 98.2°F | Resp 14 | Wt 234.5 lb

## 2014-05-16 DIAGNOSIS — R234 Changes in skin texture: Secondary | ICD-10-CM

## 2014-05-16 DIAGNOSIS — R52 Pain, unspecified: Secondary | ICD-10-CM

## 2014-05-16 MED ORDER — METFORMIN HCL 500 MG PO TABS: 500.0000 mg | ORAL_TABLET | Freq: Two times a day (BID) | ORAL | Status: DC

## 2014-05-16 MED ORDER — INSULIN DETEMIR 100 UNIT/ML FLEXPEN: 10.0000 [IU] | PEN_INJECTOR | Freq: Two times a day (BID) | SUBCUTANEOUS | Status: DC

## 2014-05-16 MED ORDER — CANAGLIFLOZIN 100 MG PO TABS: 100.0000 mg | ORAL_TABLET | Freq: Every day | ORAL | Status: DC

## 2014-05-16 NOTE — Progress Notes (Signed)
Patient ID: [name], male   DOB: [birth year], 33 y.o.   MRN: [id number]   Subjective:    Patient ID: [name], male    DOB: 01/12/1981, 33 y.o.   MRN: [id number]  HPI  Pt is a very pleasant 33 y/o male who presents to clinic with bilateral LE wounds. Pt with hx of CAD s/p CABG in 2007 at age 54, Diabetes which has been difficult to control 2/2 not able to afford medications. I have been giving him samples. He has been outside mowing his yard. Does not recall any injury to his legs. He has one wound on the back of his left leg and the other on the right inner ankle. His wife has been treating it with a special dressing for diabetics but she does not know what the ingredient is. Pt reports that the areas look smaller than previous; however, they have been present for over 3 weeks and have not fully healed.  He reports going to [facility] yesterday because of painful urination. Pt is on cipro bid x 7 days for a urinary tract infection.  Past Medical History  Diagnosis Date  . Coronary atherosclerosis of artery bypass graft     Age 59 - [place]  . Chest pain, unspecified   . Uncontrolled diabetes mellitus with complications 2002    poorly controlled  . HLD (hyperlipidemia)     poorly controlled  . HTN (hypertension)   . Obesity, unspecified   . Hx-TIA (transient ischemic attack) 2009    possible-([facility]) Normal MRI of brain and MRA of the head and neck  . Noncompliance     Current Outpatient Prescriptions on File Prior to Visit  Medication Sig Dispense Refill  . aspirin 162 MG EC tablet Take 162 mg by mouth daily.      . Linagliptin-Metformin HCl 2.5-500 MG TABS Take 1 tablet by mouth daily.  30 tablet  0  . metoprolol tartrate (LOPRESSOR) 25 MG tablet Take 1 tablet (25 mg total) by mouth 2 (two) times daily.  60 tablet  11  . rosuvastatin (CRESTOR) 20 MG tablet Take 20 mg by mouth daily.       No current facility-administered medications on file prior to visit.    Review of  Systems  Constitutional: Positive for fever (believed this was 2/2 UTI) and fatigue.       Currently being treated for UTI.  Respiratory: Negative.   Cardiovascular: Negative.   Musculoskeletal:       Pain in right lower extremity - shin  Skin: Positive for wound (2 wounds bil extremities x 2-3 weeks. Won't heal).  Neurological: Negative.   Psychiatric/Behavioral: Negative.   All other systems reviewed and are negative.      Objective:  BP 117/77  Pulse 93  Temp(Src) 98.2 F (36.8 C) (Oral)  Resp 14  Wt 234 lb 8 oz (106.369 kg)  SpO2 97%   Physical Exam  Constitutional: He is oriented to person, place, and time. He appears well-developed and well-nourished. No distress.  HENT:  Head: Normocephalic and atraumatic.  Eyes: Conjunctivae and EOM are normal.  Neck: Neck supple.  Cardiovascular: Normal rate, regular rhythm and intact distal pulses.   Pulmonary/Chest: Effort normal. No respiratory distress.  Musculoskeletal: Normal range of motion.  Neurological: He is alert and oriented to person, place, and time. Coordination normal.  Skin: Skin is warm and dry.     Psychiatric: He has a normal mood and affect. His behavior is  normal. Judgment and thought content normal.      Assessment & Plan:   1. Pain Right anterior leg (shin). Has a small area where he appears to have injured himself. I will send him for xray to r/o any bony abnormality. Suspect this may be m/s in origin from possible injury while mowing. Area does not look infected. - DG Tibia/Fibula Right; Future  2. Wound eschar of foot Wound on left posterior leg and right medial malleolus. I am referring him to [facility] to evaluate if any PAD given his extensive cardiac hx at such a young age and diabetes. RTC in 1 month for follow up.

## 2014-05-16 NOTE — Progress Notes (Signed)
Pre visit review using our clinic review tool, if applicable. No additional management support is needed unless otherwise documented below in the visit note. 

## 2014-05-16 NOTE — Patient Instructions (Signed)
  I have sent in a prescription trial for Invokana. Take this once daily.  Continue your metformin.  I have also sent in a prescription for Levemir. You will start 10 units twice a day.  [facility].  Apply petroleum to both wounds and cover with a non stick gauze. Change when becomes wet.

## 2014-06-19 ENCOUNTER — Ambulatory Visit: Payer: Commercial Managed Care - HMO | Admitting: Adult Health

## 2014-06-19 DIAGNOSIS — Z0289 Encounter for other administrative examinations: Secondary | ICD-10-CM

## 2014-06-29 ENCOUNTER — Emergency Department: Payer: Self-pay | Admitting: Emergency Medicine

## 2014-06-29 LAB — COMPREHENSIVE METABOLIC PANEL
ALBUMIN: 3.3 g/dL — AB (ref 3.4–5.0)
ALK PHOS: 77 U/L
AST: 14 U/L — AB (ref 15–37)
Anion Gap: 12 (ref 7–16)
BUN: 7 mg/dL (ref 7–18)
Bilirubin,Total: 0.3 mg/dL (ref 0.2–1.0)
CREATININE: 0.91 mg/dL (ref 0.60–1.30)
Calcium, Total: 9 mg/dL (ref 8.5–10.1)
Chloride: 102 mmol/L (ref 98–107)
Co2: 23 mmol/L (ref 21–32)
EGFR (Non-African Amer.): >60
Glucose: 375 mg/dL — ABNORMAL HIGH (ref 65–99)
Osmolality: 287 (ref 275–301)
Potassium: 3.6 mmol/L (ref 3.5–5.1)
SGPT (ALT): 23 U/L
Sodium: 137 mmol/L (ref 136–145)
Total Protein: 7.7 g/dL (ref 6.4–8.2)

## 2014-06-29 LAB — CBC
HCT: 40.4 % (ref 40.0–52.0)
HGB: 13.1 g/dL (ref 13.0–18.0)
MCH: 27.8 pg (ref 26.0–34.0)
MCHC: 32.4 g/dL (ref 32.0–36.0)
MCV: 86 fL (ref 80–100)
Platelet: 342 10*3/uL (ref 150–440)
RBC: 4.7 10*6/uL (ref 4.40–5.90)
RDW: 12.6 % (ref 11.5–14.5)
WBC: 11.1 10*3/uL — ABNORMAL HIGH (ref 3.8–10.6)

## 2014-06-29 LAB — URINALYSIS, COMPLETE
BILIRUBIN, UR: NEGATIVE
LEUKOCYTE ESTERASE: NEGATIVE
Nitrite: NEGATIVE
PH: 5 (ref 4.5–8.0)
Protein: NEGATIVE
RBC,UR: 2 /HPF (ref 0–5)
SPECIFIC GRAVITY: 1.035 (ref 1.003–1.030)
Squamous Epithelial: NONE SEEN

## 2014-07-01 ENCOUNTER — Ambulatory Visit (INDEPENDENT_AMBULATORY_CARE_PROVIDER_SITE_OTHER): Payer: Commercial Managed Care - HMO | Admitting: Internal Medicine

## 2014-07-01 DIAGNOSIS — IMO0002 Reserved for concepts with insufficient information to code with codable children: Secondary | ICD-10-CM

## 2014-07-01 DIAGNOSIS — R300 Dysuria: Secondary | ICD-10-CM

## 2014-07-01 DIAGNOSIS — E118 Type 2 diabetes mellitus with unspecified complications: Secondary | ICD-10-CM

## 2014-07-01 DIAGNOSIS — E1165 Type 2 diabetes mellitus with hyperglycemia: Secondary | ICD-10-CM

## 2014-07-01 LAB — URINE CULTURE

## 2014-07-07 NOTE — Assessment & Plan Note (Signed)
No showed

## 2014-07-07 NOTE — Progress Notes (Signed)
Patient ID: [name], male   DOB: 22-Aug-1981, 33 y.o.   MRN: [id number]  Patient no showed for appt

## 2014-07-09 ENCOUNTER — Encounter: Payer: Self-pay | Admitting: Internal Medicine

## 2014-07-09 ENCOUNTER — Ambulatory Visit (INDEPENDENT_AMBULATORY_CARE_PROVIDER_SITE_OTHER): Payer: Commercial Managed Care - HMO | Admitting: Internal Medicine

## 2014-07-09 VITALS — BP 118/80 | HR 88 | Temp 98.1°F | Wt 224.5 lb

## 2014-07-09 DIAGNOSIS — N410 Acute prostatitis: Secondary | ICD-10-CM

## 2014-07-09 LAB — CBC
HCT: 40.5 % (ref 39.0–52.0)
HEMOGLOBIN: 13.8 g/dL (ref 13.0–17.0)
MCHC: 34 g/dL (ref 30.0–36.0)
MCV: 83.5 fl (ref 78.0–100.0)
PLATELETS: 396 10*3/uL (ref 150.0–400.0)
RBC: 4.86 Mil/uL (ref 4.22–5.81)
RDW: 12.2 % (ref 11.5–15.5)
WBC: 9.9 10*3/uL (ref 4.0–10.5)

## 2014-07-09 MED ORDER — SULFAMETHOXAZOLE-TMP DS 800-160 MG PO TABS: 1.0000 | ORAL_TABLET | Freq: Two times a day (BID) | ORAL | Status: DC

## 2014-07-09 MED ORDER — TAMSULOSIN HCL 0.4 MG PO CAPS: 0.4000 mg | ORAL_CAPSULE | Freq: Every day | ORAL | Status: DC

## 2014-07-09 NOTE — Progress Notes (Signed)
HPI  Pt presents to the clinic today with c/o prostatitis. He reports he was seen at the [facility] ED 2 weeks ago. He was diagnosed with prostatitis. He has taken Tramadol and Cipro as prescribed but reports that he has not had any relief. He continues to have urgency and frequency. He reports that he has to strain to urinate. He has also noticed some blood in his urine over the last 2-3 days. He denies fever or back pain.   Review of Systems  Past Medical History  Diagnosis Date  . Coronary atherosclerosis of artery bypass graft     Age 21 - [place]  . Chest pain, unspecified   . Uncontrolled diabetes mellitus with complications 2002    poorly controlled  . HLD (hyperlipidemia)     poorly controlled  . HTN (hypertension)   . Obesity, unspecified   . Hx-TIA (transient ischemic attack) 2009    possible-([facility]) Normal MRI of brain and MRA of the head and neck  . Noncompliance     Family History  Problem Relation Age of Onset  . Emphysema Father     + smoker  . COPD Father     Died age 1  . Coronary artery disease Maternal Grandfather   . Heart attack Maternal Grandfather   . Diabetes Maternal Grandfather   . Hypertension Maternal Grandfather   . Hyperlipidemia Maternal Grandfather   . Heart disease Maternal Grandfather     CAD  . Lung cancer      paternal uncles and aunts (all smokers)  . Coronary artery disease      premature-family history    History   Social History  . Marital Status: Married    Spouse Name: N/A    Number of Children: 1  . Years of Education: N/A   Occupational History  . disability for heart condition    Social History Main Topics  . Smoking status: Former Smoker    Quit date: 10/11/2004  . Smokeless tobacco: Former Neurosurgeon     Comment: used to smoke 1 1/2 ppd  . Alcohol Use: No     Comment: 1-2 drinks per month  . Drug Use: No  . Sexual Activity: Not on file   Other Topics Concern  . Not on file   Social History Narrative      Caffeine: 3 cups coffee ~ 4 times per week, 2L diet soda per day      Lives with wife and son (54 y/o)      Lives in [place]      On disability      Exercises - walk, push ups, sit-ups. Coaches baseball. Hunting.          Allergies  Allergen Reactions  . Nitroglycerin Nausea And Vomiting    Constitutional: Denies fever, malaise, fatigue, headache or abrupt weight changes.   GU: Pt reports urgency, frequency and pain in his testicles/rectum. Denies burning sensation, odor or discharge. Skin: Denies redness, rashes, lesions or ulcercations.   No other specific complaints in a complete review of systems (except as listed in HPI above).    Objective:   Physical Exam  BP 118/80  Pulse 88  Temp(Src) 98.1 F (36.7 C) (Oral)  Wt 224 lb 8 oz (101.833 kg)  SpO2 98% Wt Readings from Last 3 Encounters:  07/09/14 224 lb 8 oz (101.833 kg)  05/16/14 234 lb 8 oz (106.369 kg)  04/03/14 236 lb 4 oz (107.162 kg)    General: Appears his  stated age, well developed, well nourished in NAD. Cardiovascular: Normal rate and rhythm. S1,S2 noted.  No murmur, rubs or gallops noted. No JVD or BLE edema. No carotid bruits noted. Pulmonary/Chest: Normal effort and positive vesicular breath sounds. No respiratory distress. No wheezes, rales or ronchi noted.  Abdomen: Soft and tender on the RLQ, LLQ. Normal bowel sounds, no bruits noted. No distention or masses noted. Liver, spleen and kidneys non palpable. Tender to palpation over the bladder area. No CVA tenderness.      Assessment & Plan:   Prostatitis:   Urinalysis: He was unable to urinate Change Cipro to Septra BID x 4 weeks Start flomax White count today Drink plenty of fluids  RTC as needed or if symptoms persist.

## 2014-07-09 NOTE — Progress Notes (Signed)
Pre visit review using our clinic review tool, if applicable. No additional management support is needed unless otherwise documented below in the visit note. 

## 2014-07-09 NOTE — Patient Instructions (Addendum)

## 2014-07-16 ENCOUNTER — Emergency Department: Payer: Self-pay | Admitting: Emergency Medicine

## 2014-07-16 LAB — COMPREHENSIVE METABOLIC PANEL
Albumin: 3 g/dL — ABNORMAL LOW (ref 3.4–5.0)
Alkaline Phosphatase: 78 U/L
Anion Gap: 8 (ref 7–16)
BILIRUBIN TOTAL: 0.2 mg/dL (ref 0.2–1.0)
BUN: 9 mg/dL (ref 7–18)
CHLORIDE: 105 mmol/L (ref 98–107)
Calcium, Total: 8.8 mg/dL (ref 8.5–10.1)
Co2: 21 mmol/L (ref 21–32)
Creatinine: 0.76 mg/dL (ref 0.60–1.30)
EGFR (African American): >60
GLUCOSE: 314 mg/dL — AB (ref 65–99)
OSMOLALITY: 279 (ref 275–301)
Potassium: 4.2 mmol/L (ref 3.5–5.1)
SGOT(AST): 20 U/L (ref 15–37)
SGPT (ALT): 20 U/L
SODIUM: 134 mmol/L — AB (ref 136–145)
TOTAL PROTEIN: 7.7 g/dL (ref 6.4–8.2)

## 2014-07-16 LAB — URINALYSIS, COMPLETE
BACTERIA: NONE SEEN
Bilirubin,UR: NEGATIVE
Blood: NEGATIVE
KETONE: NEGATIVE
LEUKOCYTE ESTERASE: NEGATIVE
NITRITE: NEGATIVE
PROTEIN: NEGATIVE
Ph: 6 (ref 4.5–8.0)
RBC,UR: 2 /HPF (ref 0–5)
SPECIFIC GRAVITY: 1.026 (ref 1.003–1.030)
Squamous Epithelial: NONE SEEN
WBC UR: 2 /HPF (ref 0–5)

## 2014-07-16 LAB — CBC
HCT: 39.1 % — ABNORMAL LOW (ref 40.0–52.0)
HGB: 12.9 g/dL — ABNORMAL LOW (ref 13.0–18.0)
MCH: 28 pg (ref 26.0–34.0)
MCHC: 33.1 g/dL (ref 32.0–36.0)
MCV: 85 fL (ref 80–100)
Platelet: 323 10*3/uL (ref 150–440)
RBC: 4.61 10*6/uL (ref 4.40–5.90)
RDW: 12.8 % (ref 11.5–14.5)
WBC: 12.4 10*3/uL — ABNORMAL HIGH (ref 3.8–10.6)

## 2014-07-17 ENCOUNTER — Ambulatory Visit (INDEPENDENT_AMBULATORY_CARE_PROVIDER_SITE_OTHER): Payer: Commercial Managed Care - HMO | Admitting: Internal Medicine

## 2014-07-17 ENCOUNTER — Emergency Department: Payer: Self-pay | Admitting: Emergency Medicine

## 2014-07-17 DIAGNOSIS — N410 Acute prostatitis: Secondary | ICD-10-CM

## 2014-07-17 LAB — COMPREHENSIVE METABOLIC PANEL
ALBUMIN: 3.3 g/dL — AB (ref 3.4–5.0)
ALK PHOS: 87 U/L
ALT: 23 U/L
ANION GAP: 6 — AB (ref 7–16)
AST: 20 U/L (ref 15–37)
BUN: 6 mg/dL — ABNORMAL LOW (ref 7–18)
Bilirubin,Total: 0.3 mg/dL (ref 0.2–1.0)
CHLORIDE: 103 mmol/L (ref 98–107)
Calcium, Total: 8.9 mg/dL (ref 8.5–10.1)
Co2: 23 mmol/L (ref 21–32)
Creatinine: 0.72 mg/dL (ref 0.60–1.30)
EGFR (African American): >60
EGFR (Non-African Amer.): >60
Glucose: 198 mg/dL — ABNORMAL HIGH (ref 65–99)
Osmolality: 268 (ref 275–301)
Potassium: 4.1 mmol/L (ref 3.5–5.1)
Sodium: 132 mmol/L — ABNORMAL LOW (ref 136–145)
Total Protein: 8.4 g/dL — ABNORMAL HIGH (ref 6.4–8.2)

## 2014-07-17 LAB — CBC WITH DIFFERENTIAL/PLATELET
BASOS PCT: 0.7 %
Basophil #: 0.1 10*3/uL (ref 0.0–0.1)
EOS PCT: 0.7 %
Eosinophil #: 0.1 10*3/uL (ref 0.0–0.7)
HCT: 45.4 % (ref 40.0–52.0)
HGB: 14.6 g/dL (ref 13.0–18.0)
LYMPHS PCT: 16.2 %
Lymphocyte #: 2.7 10*3/uL (ref 1.0–3.6)
MCH: 27.7 pg (ref 26.0–34.0)
MCHC: 32.1 g/dL (ref 32.0–36.0)
MCV: 86 fL (ref 80–100)
MONO ABS: 1.3 x10 3/mm — AB (ref 0.2–1.0)
MONOS PCT: 7.6 %
Neutrophil #: 12.6 10*3/uL — ABNORMAL HIGH (ref 1.4–6.5)
Neutrophil %: 74.8 %
Platelet: 192 10*3/uL (ref 150–440)
RBC: 5.26 10*6/uL (ref 4.40–5.90)
RDW: 12.8 % (ref 11.5–14.5)
WBC: 16.8 10*3/uL — ABNORMAL HIGH (ref 3.8–10.6)

## 2014-07-18 ENCOUNTER — Inpatient Hospital Stay: Payer: Self-pay | Admitting: Internal Medicine

## 2014-07-18 LAB — PROTIME-INR
INR: 1
Prothrombin Time: 13.5 secs (ref 11.5–14.7)

## 2014-07-18 LAB — APTT: Activated PTT: 29.2 secs (ref 23.6–35.9)

## 2014-07-19 LAB — BODY FLUID CELL COUNT WITH DIFFERENTIAL
Neutrophils: 100 %
Nucleated Cell Count: 2263903 /mm3

## 2014-07-19 LAB — CREATININE, SERUM
Creatinine: 0.84 mg/dL (ref 0.60–1.30)
EGFR (African American): >60
EGFR (Non-African Amer.): >60

## 2014-07-20 DIAGNOSIS — N410 Acute prostatitis: Secondary | ICD-10-CM | POA: Insufficient documentation

## 2014-07-20 LAB — CBC WITH DIFFERENTIAL/PLATELET
BASOS ABS: 0.1 10*3/uL (ref 0.0–0.1)
Basophil %: 0.8 %
Eosinophil #: 0.2 10*3/uL (ref 0.0–0.7)
Eosinophil %: 1.8 %
HCT: 38.9 % — ABNORMAL LOW (ref 40.0–52.0)
HGB: 12.8 g/dL — ABNORMAL LOW (ref 13.0–18.0)
LYMPHS PCT: 30.4 %
Lymphocyte #: 2.8 10*3/uL (ref 1.0–3.6)
MCH: 28 pg (ref 26.0–34.0)
MCHC: 32.9 g/dL (ref 32.0–36.0)
MCV: 85 fL (ref 80–100)
Monocyte #: 1.1 x10 3/mm — ABNORMAL HIGH (ref 0.2–1.0)
Monocyte %: 11.4 %
NEUTROS ABS: 5.2 10*3/uL (ref 1.4–6.5)
Neutrophil %: 55.6 %
Platelet: 330 10*3/uL (ref 150–440)
RBC: 4.58 10*6/uL (ref 4.40–5.90)
RDW: 12.6 % (ref 11.5–14.5)
WBC: 9.3 10*3/uL (ref 3.8–10.6)

## 2014-07-20 LAB — URINE CULTURE

## 2014-07-20 NOTE — Progress Notes (Signed)
Patient ID: [name], male   DOB: 04-Sep-1981, 33 y.o.   MRN: [id number] Patient no showed.

## 2014-07-20 NOTE — Assessment & Plan Note (Signed)
Patient no showed for a 30 minute follow up for second time in one month.

## 2014-07-22 LAB — BODY FLUID CULTURE

## 2014-07-23 LAB — CULTURE, BLOOD (SINGLE)

## 2014-08-07 ENCOUNTER — Ambulatory Visit: Payer: Self-pay | Admitting: Urology

## 2014-08-28 ENCOUNTER — Encounter: Payer: Self-pay | Admitting: Cardiovascular Disease

## 2014-08-28 ENCOUNTER — Ambulatory Visit (INDEPENDENT_AMBULATORY_CARE_PROVIDER_SITE_OTHER): Payer: Commercial Managed Care - HMO | Admitting: Cardiovascular Disease

## 2014-08-28 VITALS — BP 110/82 | HR 94 | Ht 72.0 in | Wt 223.5 lb

## 2014-08-28 DIAGNOSIS — E118 Type 2 diabetes mellitus with unspecified complications: Secondary | ICD-10-CM

## 2014-08-28 DIAGNOSIS — I10 Essential (primary) hypertension: Secondary | ICD-10-CM

## 2014-08-28 DIAGNOSIS — R079 Chest pain, unspecified: Secondary | ICD-10-CM

## 2014-08-28 DIAGNOSIS — I25810 Atherosclerosis of coronary artery bypass graft(s) without angina pectoris: Secondary | ICD-10-CM

## 2014-08-28 DIAGNOSIS — E1165 Type 2 diabetes mellitus with hyperglycemia: Secondary | ICD-10-CM

## 2014-08-28 DIAGNOSIS — IMO0002 Reserved for concepts with insufficient information to code with codable children: Secondary | ICD-10-CM

## 2014-08-28 MED ORDER — PRAVASTATIN SODIUM 40 MG PO TABS: 40.0000 mg | ORAL_TABLET | Freq: Every evening | ORAL | Status: DC

## 2014-08-28 NOTE — Assessment & Plan Note (Signed)
Previously was on metoprolol. He is declining other medications at this time

## 2014-08-28 NOTE — Assessment & Plan Note (Signed)
Currently with no symptoms of angina. We will start him on pravastatin 40 mg daily. Suggested he continue on his aspirin

## 2014-08-28 NOTE — Patient Instructions (Signed)
You are doing well. No medication changes were made.  Please call us if you have new issues that need to be addressed before your next appt.  Your physician wants you to follow-up in: 6 months.  You will receive a reminder letter in the mail two months in advance. If you don't receive a letter, please call our office to schedule the follow-up appointment.   

## 2014-08-28 NOTE — Progress Notes (Signed)
Patient ID: [name], male    DOB: [birth year], 33 y.o.   MRN: [id number]  HPI Comments: 33 yo with h/o premature CAD s/p CABG and PCI, smoking history, continued poorly controlled diabetes, medication noncompliance, previous evaluation in September 2012 for chest pain, stress test in 2011 showing no ischemia, last catheterization in 2010 showing patent grafts who presents for routine followup of his coronary artery disease.  Medication noncompliance continues to be a major issue Last stress test September 2014 showing no ischemia  In follow-up today, he reports that he is only taking aspirin and invokana. Unclear why he is not taking some of his other medications including his generic medication such as metoprolol, and simvastatin He reports that he does have some insurance and usually likes his medication off the $4 list. Reports that he was bitten by fire ants and has had large sores on his legs bilaterally. Currently with large quarter size sore on his right lower extremity that is not healing. Mild lower extremity/ankle edema Reports that he is not checking his sugars, not taking insulin. Tries to watch what he eats. Nose that his diabetes is not well controlled Reports that he is changing primary care physicians as Raquel RAy has left. Scheduled at some point to follow-up with Dr. Darrick Huntsmanullo He denies having any symptoms from his high sugars and reports not feeling well when they run low  Hemoglobin A1c typically more than 13 EKG shows no sinus rhythm with rate 94 bpm, no significant ST or T-wave changes  Other past medical history  seen in the emergency room 11/21/2013 for chronic chest pain.  Cardiac enzymes negative x2. No further workup at that time Seen in clinic in followup and felt it was atypical in nature  Chest  pain in 10/11 and [facility] where he had a stress test showing basal to mid inferolateral scar and a small area of scar in the mid anteroseptum.  There was no  ischemia.     Outpatient Encounter Prescriptions as of 08/28/2014  Medication Sig  . aspirin 162 MG EC tablet Take 162 mg by mouth daily.  . Canagliflozin 100 MG TABS Take 1 tablet (100 mg total) by mouth daily.  . metoprolol tartrate (LOPRESSOR) 25 MG tablet Take 1 tablet (25 mg total) by mouth 2 (two) times daily.  . rosuvastatin (CRESTOR) 20 MG tablet Take 20 mg by mouth daily.  Marland Kitchen. sulfamethoxazole-trimethoprim (BACTRIM DS) 800-160 MG per tablet Take 1 tablet by mouth 2 (two) times daily.  . tamsulosin (FLOMAX) 0.4 MG CAPS capsule Take 1 capsule (0.4 mg total) by mouth daily.  . traMADol (ULTRAM) 50 MG tablet Take 50 mg by mouth every 4 (four) hours.  . [DISCONTINUED] Insulin Detemir (LEVEMIR) 100 UNIT/ML Pen Inject 10 Units into the skin 2 (two) times daily.  . [DISCONTINUED] metFORMIN (GLUCOPHAGE) 500 MG tablet Take 1 tablet (500 mg total) by mouth 2 (two) times daily with a meal.  '  Review of Systems  Constitutional: Negative.   Respiratory: Negative.   Cardiovascular: Negative.   Gastrointestinal: Negative.   Endocrine: Negative.   Musculoskeletal: Negative.   Skin: Negative.   Neurological: Negative.   Hematological: Negative.   All other systems reviewed and are negative.   BP 110/82 mmHg  Pulse 94  Ht 6' (1.829 m)  Wt 223 lb 8 oz (101.379 kg)  BMI 30.31 kg/m2  Physical Exam  Constitutional: He is oriented to person, place, and time. He appears well-developed and well-nourished.  HENT:  Head: Normocephalic.  Nose: Nose normal.  Mouth/Throat: Oropharynx is clear and moist.  Eyes: Conjunctivae are normal. Pupils are equal, round, and reactive to light.  Neck: Normal range of motion. Neck supple. No JVD present.  Cardiovascular: Normal rate, regular rhythm, S1 normal, S2 normal, normal heart sounds and intact distal pulses.  Exam reveals no gallop and no friction rub.   No murmur heard. Pulmonary/Chest: Effort normal and breath sounds normal. No respiratory  distress. He has no wheezes. He has no rales. He exhibits no tenderness.  Abdominal: Soft. Bowel sounds are normal. He exhibits no distension. There is no tenderness.  Musculoskeletal: Normal range of motion. He exhibits no edema or tenderness.  Lymphadenopathy:    He has no cervical adenopathy.  Neurological: He is alert and oriented to person, place, and time. Coordination normal.  Skin: Skin is warm and dry. No rash noted. No erythema.  Psychiatric: He has a normal mood and affect. His behavior is normal. Judgment and thought content normal.      Assessment and Plan   Nursing note and vitals reviewed.

## 2014-08-28 NOTE — Assessment & Plan Note (Signed)
Stressed importance of a strict diet. Also stressed the importance of medication compliance. Currently does not have insulin or other medications apart from one oral pill Long history of medication noncompliance with poorly controlled diabetes He prefers to see primary care before being referred to endocrine

## 2014-09-04 ENCOUNTER — Ambulatory Visit (INDEPENDENT_AMBULATORY_CARE_PROVIDER_SITE_OTHER): Payer: Commercial Managed Care - HMO | Admitting: Nurse Practitioner

## 2014-09-04 ENCOUNTER — Encounter: Payer: Self-pay | Admitting: Nurse Practitioner

## 2014-09-04 VITALS — BP 100/66 | HR 98 | Temp 97.9°F | Resp 14 | Ht 72.0 in | Wt 227.2 lb

## 2014-09-04 DIAGNOSIS — M5412 Radiculopathy, cervical region: Secondary | ICD-10-CM

## 2014-09-04 DIAGNOSIS — F4322 Adjustment disorder with anxiety: Secondary | ICD-10-CM

## 2014-09-04 DIAGNOSIS — N410 Acute prostatitis: Secondary | ICD-10-CM

## 2014-09-04 NOTE — Assessment & Plan Note (Signed)
Unresolved. Patient has radicular pain into C-6 C-7 distribution on left side. Pt has not had x-rays so unsure of disc pathology. Prednisone not an option due to his uncontrolled diabetes. NSAIDs are contraindicated because of his cardiac hx. Trying Tylenol extra strength OTC max 4,000 mg daily prn for pain. He will be able to attend PT in Jan when insurance renews.

## 2014-09-04 NOTE — Assessment & Plan Note (Signed)
Unresolved. Not on anything for anxiety. Still having anxiety. Sleep is affected and gave handout on proper sleep hygiene. He is using caffeine until 8 pm in the evening. Counseled on OTC sleep meds and that they are not for long term use.

## 2014-09-04 NOTE — Assessment & Plan Note (Signed)
Resolving. Pt is being seen at [facility] ED multiple times and by Nicki Reaperegina Baity, NP. He was on Cipro x 14 days and had I and D of cyst on prostate (no notes available). 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 Informed him that this will take time to recover.

## 2014-09-04 NOTE — Patient Instructions (Addendum)
Continue to follow up with Dr. Apolinar JunesBrandon We will follow up after the holidays in Jan.  Check your blood sugars so we can better help you For Neck pain try Tylenol. Maximum dose is 4,000 mg daily (extra strength is 500 mg, can take up to 8 a day for the pain as needed).  Call us if you have any worsening symptoms Insomnia Insomnia is frequent trouble falling and/or staying asleep. Insomnia can be a long term problem or a short term problem. Both are common. Insomnia can be a short term problem when the wakefulness is related to a certain stress or worry. Long term insomnia is often related to ongoing stress during waking hours and/or poor sleeping habits. Overtime, sleep deprivation itself can make the problem worse. Every little thing feels more severe because you are overtired and your ability to cope is decreased. CAUSES   Stress, anxiety, and depression.  Poor sleeping habits.  Distractions such as TV in the bedroom.  Naps close to bedtime.  Engaging in emotionally charged conversations before bed.  Technical reading before sleep.  Alcohol and other sedatives. They may make the problem worse. They can hurt normal sleep patterns and normal dream activity.  Stimulants such as caffeine for several hours prior to bedtime.  Pain syndromes and shortness of breath can cause insomnia.  Exercise late at night.  Changing time zones may cause sleeping problems (jet lag). It is sometimes helpful to have someone observe your sleeping patterns. They should look for periods of not breathing during the night (sleep apnea). They should also look to see how long those periods last. If you live alone or observers are uncertain, you can also be observed at a sleep clinic where your sleep patterns will be professionally monitored. Sleep apnea requires a checkup and treatment. Give your caregivers your medical history. Give your caregivers observations your family has made about your sleep.  SYMPTOMS    Not feeling rested in the morning.  Anxiety and restlessness at bedtime.  Difficulty falling and staying asleep. TREATMENT   Your caregiver may prescribe treatment for an underlying medical disorders. Your caregiver can give advice or help if you are using alcohol or other drugs for self-medication. Treatment of underlying problems will usually eliminate insomnia problems.  Medications can be prescribed for short time use. They are generally not recommended for lengthy use.  Over-the-counter sleep medicines are not recommended for lengthy use. They can be habit forming.  You can promote easier sleeping by making lifestyle changes such as:  Using relaxation techniques that help with breathing and reduce muscle tension.  Exercising earlier in the day.  Changing your diet and the time of your last meal. No night time snacks.  Establish a regular time to go to bed.  Counseling can help with stressful problems and worry.  Soothing music and white noise may be helpful if there are background noises you cannot remove.  Stop tedious detailed work at least one hour before bedtime. HOME CARE INSTRUCTIONS   Keep a diary. Inform your caregiver about your progress. This includes any medication side effects. See your caregiver regularly. Take note of:  Times when you are asleep.  Times when you are awake during the night.  The quality of your sleep.  How you feel the next day. This information will help your caregiver care for you.  Get out of bed if you are still awake after 15 minutes. Read or do some quiet activity. Keep the lights down. Wait until  you feel sleepy and go back to bed.  Keep regular sleeping and waking hours. Avoid naps.  Exercise regularly.  Avoid distractions at bedtime. Distractions include watching television or engaging in any intense or detailed activity like attempting to balance the household checkbook.  Develop a bedtime ritual. Keep a familiar  routine of bathing, brushing your teeth, climbing into bed at the same time each night, listening to soothing music. Routines increase the success of falling to sleep faster.  Use relaxation techniques. This can be using breathing and muscle tension release routines. It can also include visualizing peaceful scenes. You can also help control troubling or intruding thoughts by keeping your mind occupied with boring or repetitive thoughts like the old concept of counting sheep. You can make it more creative like imagining planting one beautiful flower after another in your backyard garden.  During your day, work to eliminate stress. When this is not possible use some of the previous suggestions to help reduce the anxiety that accompanies stressful situations. MAKE SURE YOU:   Understand these instructions.  Will watch your condition.  Will get help right away if you are not doing well or get worse. Document Released: 09/24/2000 Document Revised: 12/20/2011 Document Reviewed: 10/25/2007 [facility] Patient Information 2015 [employer] [place]. This information is not intended to replace advice given to you by your health care provider. Make sure you discuss any questions you have with your health care provider.

## 2014-09-04 NOTE — Progress Notes (Signed)
Subjective:    Patient ID: [name], male    DOB: Jan 29, 1981, 33 y.o.   MRN: [id number]  HPI  [name] is a 33 yo male for a follow up of Prostatitis and other concerns.   1) He was diagnosed at [facility] ED in September and was treated by Nicki Reaperegina Baity, NP. He was a No show for Dr. Darrick Huntsmanullo twice in one month. This was due to being back in the Hospital 2 x in Oct. At [facility].   [facility]- Cyst on prostate, saw specialist (Dr. Apolinar JunesBrandon with  Radiology?? Per pt) , was infected and drained. Followed up since then and will go back in Jan. For last follow up. Pt reports all was well at the last visit. He is improving.   Took 14 days of Cipro without side effects. Also, finished Septra.   2) Pain in neck, radiates down left arm. Began 1st of 2015. Tried physical therapy, TENS unit, stretching, helped for many months until a few weeks ago. Ibuprofen is not helpful. Numbness, burning in thumb/middle finger. Intermittent, severity 7/10. No more visits left on insurance.   3) Not sleeping well. Problems staying asleep. Feels mind is running. Goes to bed around 10:30-11pm then wakes up around 4:30-5 am. Would like to sleep till 6 am. No tv in bedroom, 2-3 cups coffee in am, drinks 2-3 soda during the day with water, (recently cutting down on soda) stops soda around 8 pm. Feels tired when waking up. Denies snoring per wife and self due to losing weight.   4) ED- Tried Cialis and past without relief. Trouble initiating and maintaining erection after prostatitis.   Review of Systems  Constitutional: Positive for appetite change and fatigue. Negative for fever, chills, diaphoresis and unexpected weight change.       More hungry than usual  HENT: Positive for congestion, postnasal drip and sinus pressure. Negative for sore throat.   Eyes: Negative for visual disturbance.  Respiratory: Positive for chest tightness. Negative for shortness of breath and wheezing.        Saw Dr. Mariah MillingGollan for chest pain 08/28/14  evaluation was normal  Cardiovascular: Negative for chest pain, palpitations and leg swelling.  Gastrointestinal: Negative for nausea, vomiting, abdominal pain and diarrhea.  Endocrine: Positive for polyphagia. Negative for polydipsia and polyuria.  Genitourinary: Positive for urgency and frequency. Negative for dysuria, difficulty urinating, penile pain and testicular pain.  Musculoskeletal: Positive for back pain and neck pain. Negative for gait problem.  Skin: Negative for rash.  Neurological: Positive for headaches.  Psychiatric/Behavioral:       Positive for anxiety. Denies depression    Past Medical History  Diagnosis Date  . Coronary atherosclerosis of artery bypass graft     Age 33 - [place]  . Chest pain, unspecified   . Uncontrolled diabetes mellitus with complications 2002    poorly controlled  . HLD (hyperlipidemia)     poorly controlled  . HTN (hypertension)   . Obesity, unspecified   . Hx-TIA (transient ischemic attack) 2009    possible-([facility]) Normal MRI of brain and MRA of the head and neck  . Noncompliance     History   Social History  . Marital Status: Married    Spouse Name: N/A    Number of Children: 1  . Years of Education: N/A   Occupational History  . disability for heart condition    Social History Main Topics  . Smoking status: Former Smoker    Quit date:  10/11/2004  . Smokeless tobacco: Former NeurosurgeonUser     Comment: used to smoke 1 1/2 ppd  . Alcohol Use: No     Comment: 1-2 drinks per month  . Drug Use: No  . Sexual Activity: Not on file   Other Topics Concern  . Not on file   Social History Narrative      Caffeine: 3 cups coffee ~ 4 times per week, 2L diet soda per day      Lives with wife and son (34 y/o)      [place]      On disability      Exercises - walk, push ups, sit-ups. Coaches baseball. Hunting.          Past Surgical History  Procedure Laterality Date  . Coronary artery bypass graft  2007    x 4 (age  89)    Family History  Problem Relation Age of Onset  . Emphysema Father     + smoker  . COPD Father     Died age 34  . Coronary artery disease Maternal Grandfather   . Heart attack Maternal Grandfather   . Diabetes Maternal Grandfather   . Hypertension Maternal Grandfather   . Hyperlipidemia Maternal Grandfather   . Heart disease Maternal Grandfather     CAD  . Lung cancer      paternal uncles and aunts (all smokers)  . Coronary artery disease      premature-family history    Allergies  Allergen Reactions  . Nitroglycerin Nausea And Vomiting    Current Outpatient Prescriptions on File Prior to Visit  Medication Sig Dispense Refill  . aspirin 162 MG EC tablet Take 162 mg by mouth daily.    . Canagliflozin 100 MG TABS Take 1 tablet (100 mg total) by mouth daily. 30 tablet 0  . pravastatin (PRAVACHOL) 40 MG tablet Take 1 tablet (40 mg total) by mouth every evening. 90 tablet 3   No current facility-administered medications on file prior to visit.    BP 100/66 mmHg  Pulse 98  Temp(Src) 97.9 F (36.6 C) (Oral)  Resp 14  Ht 6' (1.829 m)  Wt 227 lb 4 oz (103.08 kg)  BMI 30.81 kg/m2  SpO2 97%       Objective:   Physical Exam  Constitutional: He is oriented to person, place, and time. He appears well-developed and well-nourished. No distress.  HENT:  Head: Normocephalic and atraumatic.  Eyes: Conjunctivae and EOM are normal. Pupils are equal, round, and reactive to light. Right eye exhibits no discharge. Left eye exhibits no discharge. No scleral icterus.  Neck: Normal range of motion. Neck supple. No thyromegaly present.  Cardiovascular: Normal rate and regular rhythm.  Exam reveals no friction rub.   No murmur heard. Pulmonary/Chest: Effort normal and breath sounds normal. No respiratory distress. He has no wheezes. He has no rales. He exhibits no tenderness.  Abdominal: Soft. Bowel sounds are normal. He exhibits no distension and no mass. There is no tenderness.  There is no rebound and no guarding.  Musculoskeletal: Normal range of motion. He exhibits no edema or tenderness.  Lymphadenopathy:    He has no cervical adenopathy.  Neurological: He is alert and oriented to person, place, and time. He has normal reflexes.  Skin: Skin is warm and dry. No rash noted. He is not diaphoretic. No erythema. No pallor.  Psychiatric: He has a normal mood and affect. His behavior is normal. Judgment and thought content normal.  Assessment & Plan:

## 2014-09-04 NOTE — Progress Notes (Signed)
Pre visit review using our clinic review tool, if applicable. No additional management support is needed unless otherwise documented below in the visit note. 

## 2014-09-14 IMAGING — CR DG TIBIA/FIBULA 2V*R*
4 series · 4 of 4 positions shown · non-contrast
Comparison: None.

CLINICAL DATA: Pain, right ankle ulcer

EXAM:
RIGHT TIBIA AND FIBULA - 2 VIEW

[view not recorded (1 of 4)]
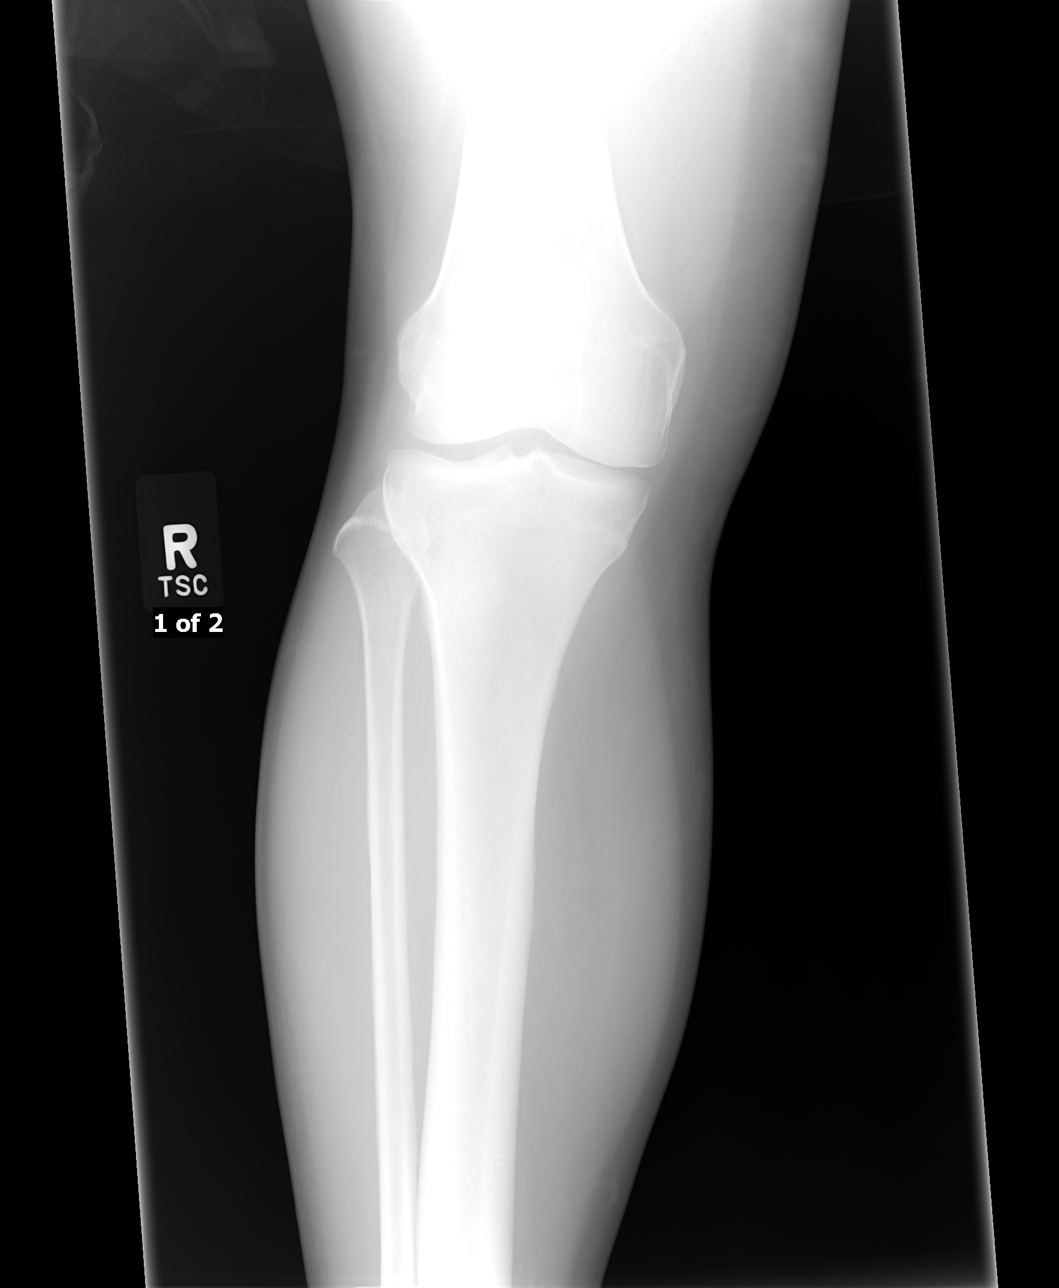

[view not recorded (2 of 4)]
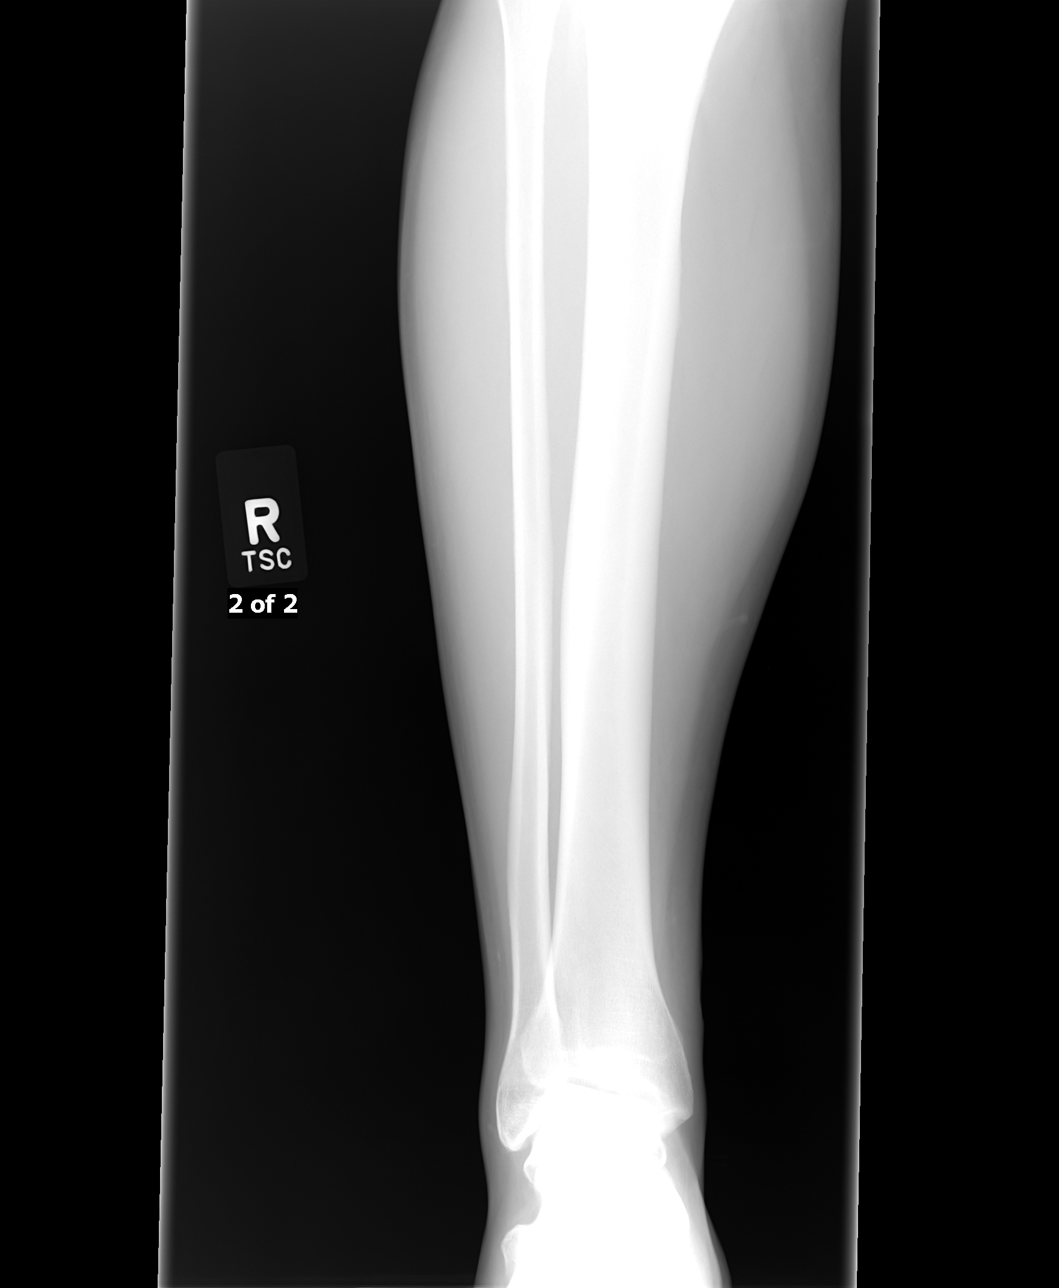

[view not recorded (3 of 4)]
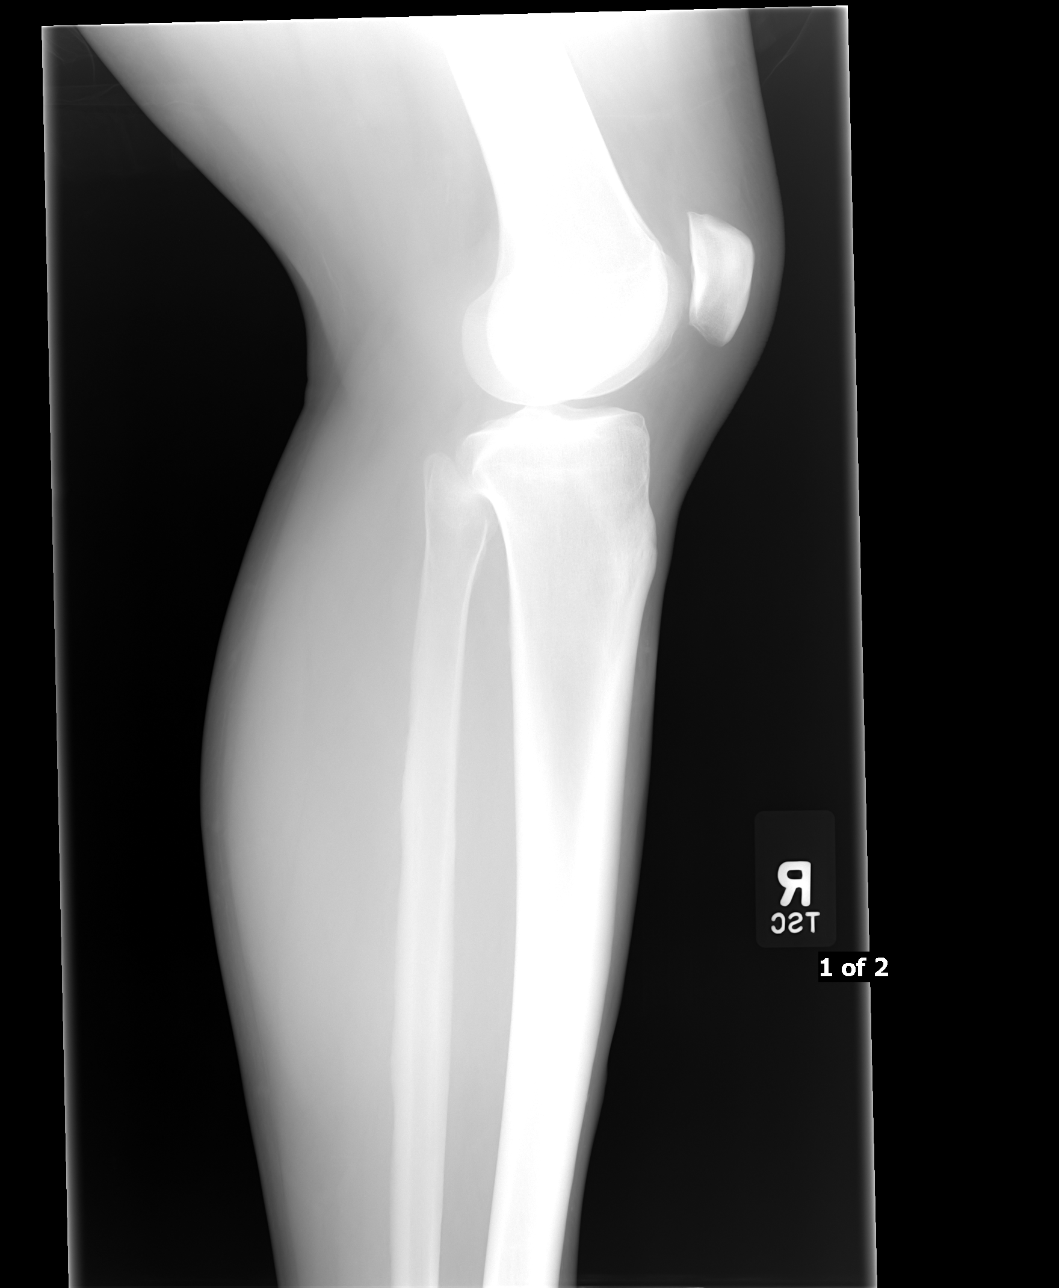

[view not recorded (4 of 4)]
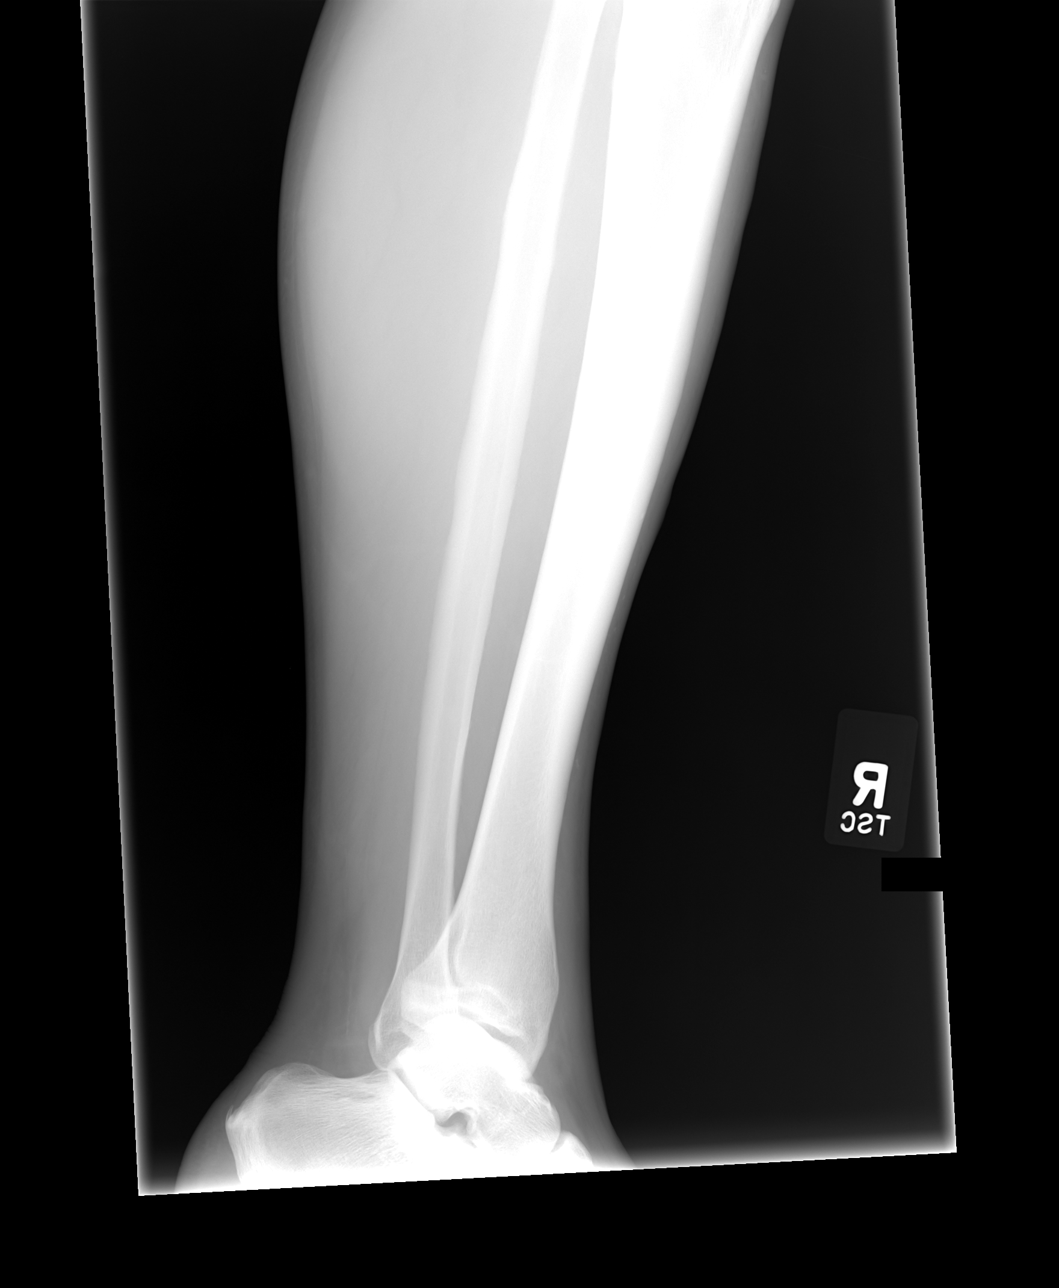

[4 of 4 positions shown; findings below may reference images not displayed]

FINDINGS: Four views of the right tibia-fibula submitted. No acute fracture or
subluxation. No periosteal reaction or bony erosion.
IMPRESSION: Negative.

## 2014-11-04 ENCOUNTER — Ambulatory Visit: Payer: Commercial Managed Care - HMO | Admitting: Nurse Practitioner

## 2014-12-09 ENCOUNTER — Encounter: Payer: Self-pay | Admitting: Nurse Practitioner

## 2014-12-09 ENCOUNTER — Ambulatory Visit (INDEPENDENT_AMBULATORY_CARE_PROVIDER_SITE_OTHER): Payer: Commercial Managed Care - HMO | Admitting: Nurse Practitioner

## 2014-12-09 DIAGNOSIS — F418 Other specified anxiety disorders: Secondary | ICD-10-CM

## 2014-12-09 DIAGNOSIS — M5412 Radiculopathy, cervical region: Secondary | ICD-10-CM

## 2014-12-09 DIAGNOSIS — IMO0002 Reserved for concepts with insufficient information to code with codable children: Secondary | ICD-10-CM

## 2014-12-09 DIAGNOSIS — E118 Type 2 diabetes mellitus with unspecified complications: Secondary | ICD-10-CM

## 2014-12-09 DIAGNOSIS — E1165 Type 2 diabetes mellitus with hyperglycemia: Secondary | ICD-10-CM

## 2014-12-09 LAB — COMPREHENSIVE METABOLIC PANEL
ALT: 21 U/L (ref 0–53)
AST: 10 U/L (ref 0–37)
Albumin: 4.3 g/dL (ref 3.5–5.2)
Alkaline Phosphatase: 50 U/L (ref 39–117)
BILIRUBIN TOTAL: 0.5 mg/dL (ref 0.2–1.2)
BUN: 12 mg/dL (ref 6–23)
CO2: 27 mEq/L (ref 19–32)
Calcium: 9.3 mg/dL (ref 8.4–10.5)
Chloride: 100 mEq/L (ref 96–112)
Creatinine, Ser: 0.74 mg/dL (ref 0.40–1.50)
GFR: 127.91 mL/min (ref >60.00)
Glucose, Bld: 329 mg/dL — ABNORMAL HIGH (ref 70–99)
POTASSIUM: 4.1 meq/L (ref 3.5–5.1)
SODIUM: 136 meq/L (ref 135–145)
TOTAL PROTEIN: 7.2 g/dL (ref 6.0–8.3)

## 2014-12-09 LAB — HEMOGLOBIN A1C: Hgb A1c MFr Bld: 12.8 % — ABNORMAL HIGH (ref 4.6–6.5)

## 2014-12-09 MED ORDER — TIZANIDINE HCL 4 MG PO TABS: 4.0000 mg | ORAL_TABLET | Freq: Every day | ORAL | Status: DC

## 2014-12-09 MED ORDER — ESCITALOPRAM OXALATE 10 MG PO TABS: 10.0000 mg | ORAL_TABLET | Freq: Every day | ORAL | Status: DC

## 2014-12-09 NOTE — Patient Instructions (Signed)
Please visit the lab before leaving today.   We will call you about your referral for Diabetes.   We will see you in 6 weeks. Please make this appointment before leaving today!

## 2014-12-09 NOTE — Progress Notes (Signed)
Subjective:    Patient ID: [name], male    DOB: 04/22/1981, 34 y.o.   MRN: [id number]  HPI  Mr. Baldus is a 34 yo male with a CC of extremity weakness, headaches, numbness x 3-6 months.   1) Patient feels tired and achy. Can not do what he wants to do because of fatigue.  Not checking BS at home or taking medications.  Feet are numb and feel cold all of the time.  Walks with a limp because legs feel weak.  Tingling in hands when he goes to bed  Burning/itching sensation into shoulder, mainly left (left dominant)   With creamer in coffee today.  Dx in 2000's    1) Anxiety- Occasional cold beer, no recreational drugs. Short fuse, lots of anxiety. Father passed at 53- emphysema, he feels his age is approaching his father's when he passed and this makes him anxious.   Review of Systems  Constitutional: Negative for fever, chills, diaphoresis, appetite change and fatigue.  Eyes: Negative for visual disturbance.  Respiratory: Negative for cough, chest tightness and wheezing.   Cardiovascular: Negative for chest pain, palpitations and leg swelling.  Gastrointestinal: Negative for nausea, vomiting and diarrhea.  Endocrine: Negative for polydipsia, polyphagia and polyuria.  Genitourinary: Negative for difficulty urinating.  Skin: Negative for color change, rash and wound.  Neurological: Positive for weakness, numbness and headaches.  Psychiatric/Behavioral: Positive for sleep disturbance and agitation. Negative for suicidal ideas and self-injury. The patient is nervous/anxious.    Past Medical History  Diagnosis Date  . Coronary atherosclerosis of artery bypass graft     Age 75 - [place]  . Chest pain, unspecified   . Uncontrolled diabetes mellitus with complications 2002    poorly controlled  . HLD (hyperlipidemia)     poorly controlled  . HTN (hypertension)   . Obesity, unspecified   . Hx-TIA (transient ischemic attack) 2009    possible-([facility]) Normal MRI of brain and  MRA of the head and neck  . Noncompliance     History   Social History  . Marital Status: Married    Spouse Name: N/A  . Number of Children: 1  . Years of Education: N/A   Occupational History  . disability for heart condition    Social History Main Topics  . Smoking status: Former Smoker    Quit date: 10/11/2004  . Smokeless tobacco: Former Neurosurgeon     Comment: used to smoke 1 1/2 ppd  . Alcohol Use: No     Comment: 1-2 drinks per month  . Drug Use: No  . Sexual Activity: Not on file   Other Topics Concern  . Not on file   Social History Narrative      Caffeine: 3 cups coffee ~ 4 times per week, 2L diet soda per day      Lives with wife and son (60 y/o)      Lives in [place]      On disability      Exercises - walk, push ups, sit-ups. Coaches baseball. Hunting.          Past Surgical History  Procedure Laterality Date  . Coronary artery bypass graft  2007    x 4 (age 12)    Family History  Problem Relation Age of Onset  . Emphysema Father     + smoker  . COPD Father     Died age 41  . Coronary artery disease Maternal Grandfather   . Heart  attack Maternal Grandfather   . Diabetes Maternal Grandfather   . Hypertension Maternal Grandfather   . Hyperlipidemia Maternal Grandfather   . Heart disease Maternal Grandfather     CAD  . Lung cancer      paternal uncles and aunts (all smokers)  . Coronary artery disease      premature-family history    Allergies  Allergen Reactions  . Nitroglycerin Nausea And Vomiting    Current Outpatient Prescriptions on File Prior to Visit  Medication Sig Dispense Refill  . aspirin 162 MG EC tablet Take 162 mg by mouth daily.    . Canagliflozin 100 MG TABS Take 1 tablet (100 mg total) by mouth daily. (Patient not taking: Reported on 12/09/2014) 30 tablet 0  . pravastatin (PRAVACHOL) 40 MG tablet Take 1 tablet (40 mg total) by mouth every evening. (Patient not taking: Reported on 12/09/2014) 90 tablet 3   No current  facility-administered medications on file prior to visit.       Objective:   Physical Exam  Constitutional: He is oriented to person, place, and time. He appears well-developed and well-nourished. No distress.  BP 120/80 mmHg  Pulse 86  Temp(Src) 97.6 F (36.4 C) (Oral)  Resp 12  Ht 6' (1.829 m)  Wt 227 lb 12.8 oz (103.329 kg)  BMI 30.89 kg/m2  SpO2 98%   HENT:  Head: Normocephalic and atraumatic.  Right Ear: External ear normal.  Left Ear: External ear normal.  Mouth/Throat: No oropharyngeal exudate.  Eyes: EOM are normal. Pupils are equal, round, and reactive to light. Right eye exhibits no discharge. Left eye exhibits no discharge. No scleral icterus.  Neck: Normal range of motion. Neck supple. No thyromegaly present.  Cardiovascular: Normal rate, regular rhythm and normal heart sounds.  Exam reveals no gallop and no friction rub.   No murmur heard. Pulmonary/Chest: Effort normal and breath sounds normal. No respiratory distress. He has no wheezes. He has no rales. He exhibits no tenderness.  Musculoskeletal: Normal range of motion. He exhibits no edema or tenderness.  Lymphadenopathy:    He has no cervical adenopathy.  Neurological: He is alert and oriented to person, place, and time. He displays normal reflexes. He exhibits normal muscle tone. Coordination normal.  No findings of weakness on exam. Decreased sensation in hands and feet to light touch.   Skin: Skin is warm. No rash noted. He is diaphoretic.  Psychiatric: He has a normal mood and affect. His behavior is normal. Judgment and thought content normal.      Assessment & Plan:

## 2014-12-09 NOTE — Progress Notes (Signed)
Pre visit review using our clinic review tool, if applicable. No additional management support is needed unless otherwise documented below in the visit note. 

## 2014-12-10 LAB — CBC WITH DIFFERENTIAL
Basophils Absolute: 0 10*3/uL (ref 0.0–0.2)
Basos: 1 %
EOS ABS: 0.1 10*3/uL (ref 0.0–0.4)
Eos: 2 %
HCT: 44.9 % (ref 37.5–51.0)
Hemoglobin: 15 g/dL (ref 12.6–17.7)
IMMATURE GRANS (ABS): 0 10*3/uL (ref 0.0–0.1)
Immature Granulocytes: 0 %
Lymphocytes Absolute: 3 10*3/uL (ref 0.7–3.1)
Lymphs: 49 %
MCH: 28.3 pg (ref 26.6–33.0)
MCHC: 33.4 g/dL (ref 31.5–35.7)
MCV: 85 fL (ref 79–97)
MONOS ABS: 0.4 10*3/uL (ref 0.1–0.9)
Monocytes: 7 %
NEUTROS PCT: 41 %
Neutrophils Absolute: 2.4 10*3/uL (ref 1.4–7.0)
RBC: 5.3 x10E6/uL (ref 4.14–5.80)
RDW: 12.8 % (ref 12.3–15.4)
WBC: 6 10*3/uL (ref 3.4–10.8)

## 2014-12-10 LAB — MICROALBUMIN, URINE: MICROALB UR: 2.1 mg/dL — AB (ref <2.0)

## 2014-12-14 NOTE — Assessment & Plan Note (Signed)
Pt and wife are interested on him starting a medication for helping with his depression and anxiety. Will start Lexapro 10 mg and see him back in 6 weeks to titrate. Discussed this takes consistent use daily over a few weeks before he will see any difference. Discussed side effects including low libido and suicidal thoughts. He will contact me if the latter occurs. FU in 6 weeks.

## 2014-12-14 NOTE — Assessment & Plan Note (Signed)
Pt is poorly controlled, non-compliant, and is very anxious today. We discussed seeing our endocrinologist Dr. Welford RochePhadke and he wanted to see how bad his lab work was prior to putting in a referral. His wife accompanies him today and she seems to be a good support for him. Will obtain Microalbumin, A1c, CMET, and CBC w/ diff.   * Up date- Seeing Dr. Welford RochePhadke on 12/25/14 as a new pt.

## 2014-12-14 NOTE — Assessment & Plan Note (Signed)
Added small dose of tizanidine 4 mg at night for some muscular relief.

## 2014-12-25 ENCOUNTER — Encounter: Payer: Self-pay | Admitting: Endocrinology

## 2014-12-25 ENCOUNTER — Ambulatory Visit (INDEPENDENT_AMBULATORY_CARE_PROVIDER_SITE_OTHER): Payer: Commercial Managed Care - HMO | Admitting: Endocrinology

## 2014-12-25 VITALS — BP 110/72 | HR 100 | Resp 14 | Ht 72.0 in | Wt 225.5 lb

## 2014-12-25 DIAGNOSIS — E785 Hyperlipidemia, unspecified: Secondary | ICD-10-CM

## 2014-12-25 DIAGNOSIS — I10 Essential (primary) hypertension: Secondary | ICD-10-CM

## 2014-12-25 DIAGNOSIS — E118 Type 2 diabetes mellitus with unspecified complications: Secondary | ICD-10-CM

## 2014-12-25 DIAGNOSIS — IMO0002 Reserved for concepts with insufficient information to code with codable children: Secondary | ICD-10-CM

## 2014-12-25 DIAGNOSIS — E1165 Type 2 diabetes mellitus with hyperglycemia: Secondary | ICD-10-CM

## 2014-12-25 LAB — HM DIABETES FOOT EXAM: HM Diabetic Foot Exam: ABNORMAL

## 2014-12-25 MED ORDER — INSULIN ASPART 100 UNIT/ML FLEXPEN: PEN_INJECTOR | SUBCUTANEOUS | Status: DC

## 2014-12-25 MED ORDER — INSULIN PEN NEEDLE 32G X 4 MM MISC: Status: DC

## 2014-12-25 MED ORDER — ONETOUCH DELICA LANCETS 33G MISC: Status: DC

## 2014-12-25 MED ORDER — PRAVASTATIN SODIUM 40 MG PO TABS: 40.0000 mg | ORAL_TABLET | Freq: Every day | ORAL | Status: DC

## 2014-12-25 MED ORDER — INSULIN GLARGINE 100 UNIT/ML SOLOSTAR PEN: 15.0000 [IU] | PEN_INJECTOR | Freq: Every day | SUBCUTANEOUS | Status: DC

## 2014-12-25 MED ORDER — GLUCOSE BLOOD VI STRP: ORAL_STRIP | Status: DC

## 2014-12-25 NOTE — Progress Notes (Signed)
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   HPI- Patient has been diagnosed with diabetes in 2002. Recalls being initially on lifestyle modifications.  Tried  Metformin, Invokana, lantus, Novolin 70/30.    *Has been off meds since Sept 2015. Was on Invokana at that time. *Insulin 2014- couldn't afford the insulin. * Is on disability since 2006  Pt is currently on a regimen of: -n/a   Last hemoglobin A1c was: Lab Results  Component Value Date   HGBA1C 12.8* 12/09/2014   HGBA1C 12.7* 04/03/2014   HGBA1C 13.6* 11/12/2013     Pt checks his sugars 0 a day . Uses ? Older glucometer. By recall/meter download/meter review they are:  PREMEAL Breakfast Lunch Dinner Bedtime Overall  Glucose range:     n/a  Mean/median:        POST-MEAL PC Breakfast PC Lunch PC Dinner  Glucose range:     Mean/median:       Hypoglycemia-  No lows. Lowest sugar was n/a; he has hypoglycemia awareness at 70.   Dietary habits- eats three times daily. Tries to limit carbs, sweetened beverages, diet sodas, desserts.  BF- steak, egg cheese biscuit, hasbrowns, diet coke or toast/coffee Lunch- [employer] sandwich , cheese sandwich Supper- steak, potatoes, veggies Snacks- nabs Exercise- walks the dog  Daily. 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 known neuropathy.  Associated history - CAD s/p CABG . has TIA. No hypothyroidism. his last TSH was  Lab Results  Component Value Date   TSH 0.65 04/03/2014    Hyperlipidemia-  his last set of lipids were- was on Pravachol 40 till about Sept 2015.  Was tolerating well.  Stopped for unclear reasons.  Lab Results  Component Value Date   CHOL 258* 11/12/2013   HDL 40.10 11/12/2013   LDLCALC 69 12/28/2010   LDLDIRECT 175.2 11/12/2013   TRIG 250.0* 11/12/2013   CHOLHDL 6 11/12/2013    Blood Pressure/HTN- Patient's blood pressure is well controlled today.  Pt has FH of DM in [place]. Quit smoking 2006.   I have reviewed the patient's past medical history, family and social history, surgical history, medications and allergies.  Past Medical History  Diagnosis Date  . Coronary atherosclerosis of artery bypass graft     Age 51 - [place]  . Chest pain, unspecified   . Uncontrolled diabetes mellitus with complications 2002    poorly controlled  . HLD (hyperlipidemia)     poorly controlled  . HTN (hypertension)   . Obesity, unspecified   . Hx-TIA (transient ischemic attack) 2009    possible-([facility]) Normal MRI of brain and MRA of the head and neck  . Noncompliance    Past  Surgical History  Procedure Laterality Date  . Coronary artery bypass graft  2007    x 4 (age 62)   Family History  Problem Relation Age of Onset  . Emphysema Father     + smoker  . COPD Father     Died age 62  . Coronary artery disease Maternal Grandfather   . Heart attack Maternal Grandfather   . Diabetes Maternal Grandfather   . Hypertension Maternal Grandfather   . Hyperlipidemia Maternal Grandfather   . Heart disease Maternal Grandfather     CAD  . Lung cancer      paternal uncles and aunts (all smokers)  . Coronary artery disease      premature-family history   History   Social History  . Marital Status: Married     Spouse Name: N/A  . Number of Children: 1  . Years of Education: N/A   Occupational History  . disability for heart condition    Social History Main Topics  . Smoking status: Former Smoker    Quit date: 10/11/2004  . Smokeless tobacco: Former Neurosurgeon     Comment: used to smoke 1 1/2 ppd  . Alcohol Use: No     Comment: 1-2 drinks per month  . Drug Use: No  . Sexual Activity: Not on file   Other Topics Concern  . Not on file   Social History Narrative      Caffeine: 3 cups coffee ~ 4 times per week, 2L diet soda per day      Lives with wife and son (56 y/o)      Lives in [place]      On disability      Exercises - walk, push ups, sit-ups. Coaches baseball. Hunting.         Current Outpatient Prescriptions on File Prior to Visit  Medication Sig Dispense Refill  . escitalopram (LEXAPRO) 10 MG tablet Take 1 tablet (10 mg total) by mouth daily. 30 tablet 1  . tiZANidine (ZANAFLEX) 4 MG tablet Take 1 tablet (4 mg total) by mouth at bedtime. 30 tablet 0   No current facility-administered medications on file prior to visit.   Allergies  Allergen Reactions  . Nitroglycerin Nausea And Vomiting     Review of Systems: [x]  complains of  [  ] denies General:   [  ] Recent weight change [ x ] Fatigue  [ x ] Loss of appetite Eyes: [ x ]  Vision Difficulty [  ]  Eye pain ENT: [  ]  Hearing difficulty [  ]  Difficulty Swallowing CVS: [ x ] Chest pain-not current [  ]  Palpitations/Irregular Heart beat [ x ]  Shortness of breath lying flat [  ] Swelling of legs Resp: [  ] Frequent Cough [ x ] Shortness of Breath  [  ]  Wheezing GI: [  ] Heartburn  [  ] Nausea or Vomiting  [  ] Diarrhea [  ] Constipation  [ x ] Abdominal Pain GU: [  ]  Polyuria  [  ]  nocturia Bones/joints:  [ x ]  Muscle aches  [ x ] Joint Pain  [  ] Bone pain Skin/Hair/Nails: [  ]  Rash  [  ] New stretch marks [  ]  Itching [  ] Hair loss [  ]  Excessive hair growth Reproduction: [  ] Low sexual desire , [   ]  Women: Menstrual cycle problems [  ]  Women: Breast Discharge [ x ] Men: Difficulty with erections [  ]  Men: Enlarged Breasts CNS: [ x ] Frequent Headaches [ x ] Blurry vision [  ] Tremors [  ] Seizures [  ] Loss of consciousness [ x ] Localized weakness Endocrine: [  ]  Excess thirst [ x ]  Feeling excessively hot [  ]  Feeling excessively cold Heme: [  ]  Easy bruising [  ]  Enlarged glands or lumps in neck Allergy: [  ]  Food allergies [  ] Environmental allergies  PE: BP 110/72 mmHg  Pulse 100  Resp 14  Ht 6' (1.829 m)  Wt 225 lb 8 oz (102.286 kg)  BMI 30.58 kg/m2  SpO2 97% Wt Readings from Last 3 Encounters:  12/25/14 225 lb 8 oz (102.286 kg)  12/09/14 227 lb 12.8 oz (103.329 kg)  09/04/14 227 lb 4 oz (103.08 kg)   GENERAL: No acute distress, well developed HEENT:  Eye exam shows normal external appearance. Oral exam shows normal mucosa .  NECK:   Neck exam shows no lymphadenopathy. No Carotids bruits. Thyroid is not enlarged and no nodules felt.  no acanthosis nigricans LUNGS:         Chest is symmetrical. Lungs are clear to auscultation.Marland Kitchen   HEART:         Heart sounds:  S1 and S2 are normal. No murmurs or clicks heard. ABDOMEN:  No Distention present. Liver and spleen are not palpable. No other mass or tenderness present. some soreness left back from recent injury.  EXTREMITIES:     There is no edema. 2+ DP pulses  NEUROLOGICAL:     Grossly intact.            Diabetic foot exam done with shoes and socks removed: decrease in Normal Monofilament testing bilaterally, esp over toes/soles. No deformities of toes.  Nails  Not dystrophic. Skin normal color. No open wounds. Dry skin.  MUSCULOSKELETAL:       There is no enlargement or gross deformity of the joints.  SKIN:       No rash  ASSESSMENT AND PLAN: Problem List Items Addressed This Visit      Cardiovascular and Mediastinum   Essential hypertension    BP at target today. Update urine MA at next few visits when sugars are  better.      Relevant Medications   pravastatin (PRAVACHOL) tablet     Endocrine   Diabetes mellitus type 2 with complications, uncontrolled - Primary    Discussed pathophysiology of DM, reviewed goal sugars and A1c. Reviewed risks of long term complications of DM with uncontrolled sugars.  Reviewed dietary modifications and recommendations for activity.   Recommend that he start checking his sugars and new meter given. Rx for testing supplies  Sent.  Foot care, eye exams, hypoglycemia discussed.  Discussed medication management. Feel that insulin therapy would be most effective at this stage.  Discussed various forms of insulin therapy. He is agreeable to start basal/bolus therapy. Since most of the time, BF is toast and coffee, will start by concentrating on lunch and dinner time readings.   Start Lantus 15 units daily at bedtime and Novolog at 5+1 scale qac. Notify if lows, check sugars 3 x daily.  Return in 2 weeks for further medication adjustments.        Relevant Medications   ONETOUCH DELICA LANCETS 33G MISC   glucose blood (ONE TOUCH ULTRA TEST) test strip   Insulin  Pen Needle (BD PEN NEEDLE NANO U/F) 32G X 4 MM MISC   pravastatin (PRAVACHOL) tablet   LANTUS SOLOSTAR 100 UNIT/ML [place] SOLN   insulin aspart (NOVOLOG FLEXPEN) 100 UNIT/ML FlexPen     Other   HLD (hyperlipidemia)    Asked him to restart his statin since he was tolerating well and probably stopped it after he ran out of RF in sept 2015. Reviewed that with his hx CABG, goal LDL<70. Will recheck lipids at follow up visits to assess response.       Relevant Medications   pravastatin (PRAVACHOL) tablet        - Return to clinic in 2 weeks with sugar log/meter.  [facility] 12/27/2014 10:13 AM

## 2014-12-25 NOTE — Patient Instructions (Signed)
Check sugars 3 times daily ( fasting and premeal readings at alternating times, or at bedtime).  Record them in a sugar log and bring log and meter to next appointment.   Start lantus at 15 units at night time  Start Novolog ( meal time insulin) per chart below-  Blood Glucose (mg/dL)  Breakfast  (Units Novolog Insulin)  Lunch  (Units Novolog Insulin)  Supper  (Units Novolog Insulin)  Nighttime (Units Novolog Insulin)   <70 Treat the low blood sugar.  Recheck blood glucose  in 15 mins. If sugar is more than 70, then take the number of units of insulin in the 70-90 row, if before a meal.   70-90   3   3   3   0  91-130 (Base Dose)  5  5  5   0   131-150  6  6  6   0   151-200  7  7  7   0   201-250  8  8  8  0   251-300  9  9  9 1    301-350  10  10  10 2    351-400  11  11  11  3    401-450  12  12  12 4    >450  13  13  13  5    Lantus insulin 15 units subcutaneous Nightly     Please come back for a follow-up appointment in 2 weeks Restart pravastatin 40 mg daily.

## 2014-12-25 NOTE — Progress Notes (Signed)
Pre visit review using our clinic review tool, if applicable. No additional management support is needed unless otherwise documented below in the visit note. 

## 2014-12-27 NOTE — Assessment & Plan Note (Signed)
BP at target today. Update urine MA at next few visits when sugars are better.

## 2014-12-27 NOTE — Assessment & Plan Note (Signed)
Discussed pathophysiology of DM, reviewed goal sugars and A1c. Reviewed risks of long term complications of DM with uncontrolled sugars.  Reviewed dietary modifications and recommendations for activity.   Recommend that he start checking his sugars and new meter given. Rx for testing supplies  Sent.  Foot care, eye exams, hypoglycemia discussed.  Discussed medication management. Feel that insulin therapy would be most effective at this stage.  Discussed various forms of insulin therapy. He is agreeable to start basal/bolus therapy. Since most of the time, BF is toast and coffee, will start by concentrating on lunch and dinner time readings.   Start Lantus 15 units daily at bedtime and Novolog at 5+1 scale qac. Notify if lows, check sugars 3 x daily.  Return in 2 weeks for further medication adjustments.

## 2014-12-27 NOTE — Assessment & Plan Note (Signed)
Asked him to restart his statin since he was tolerating well and probably stopped it after he ran out of RF in sept 2015. Reviewed that with his hx CABG, goal LDL<70. Will recheck lipids at follow up visits to assess response.

## 2014-12-30 ENCOUNTER — Emergency Department: Payer: Self-pay | Admitting: Emergency Medicine

## 2015-01-08 ENCOUNTER — Telehealth: Payer: Self-pay

## 2015-01-08 ENCOUNTER — Ambulatory Visit: Payer: Commercial Managed Care - HMO | Admitting: Endocrinology

## 2015-01-08 NOTE — Telephone Encounter (Signed)
Tried to call patient to inquire about no-show, no answer.

## 2015-01-09 ENCOUNTER — Other Ambulatory Visit: Payer: Self-pay | Admitting: *Deleted

## 2015-01-09 DIAGNOSIS — E785 Hyperlipidemia, unspecified: Secondary | ICD-10-CM

## 2015-01-09 MED ORDER — TRUEPLUS LANCETS 28G MISC: Status: DC

## 2015-01-09 MED ORDER — TRUE METRIX AIR GLUCOSE METER DEVI: 1.0000 | Freq: Every day | Status: DC

## 2015-01-09 MED ORDER — PRAVASTATIN SODIUM 40 MG PO TABS: 40.0000 mg | ORAL_TABLET | Freq: Every day | ORAL | Status: DC

## 2015-01-09 MED ORDER — TIZANIDINE HCL 4 MG PO TABS: 4.0000 mg | ORAL_TABLET | Freq: Every day | ORAL | Status: DC

## 2015-01-09 MED ORDER — TRUE METRIX LEVEL 1 LOW VI SOLN: Status: DC

## 2015-01-09 MED ORDER — BD SWAB SINGLE USE REGULAR PADS: MEDICATED_PAD | Status: DC

## 2015-01-09 MED ORDER — GLUCOSE BLOOD VI STRP
ORAL_STRIP | Status: DC
Start: 2015-01-09 — End: 2015-05-30

## 2015-01-09 MED ORDER — ESCITALOPRAM OXALATE 10 MG PO TABS: 10.0000 mg | ORAL_TABLET | Freq: Every day | ORAL | Status: DC

## 2015-01-09 NOTE — Telephone Encounter (Signed)
Ok refill? 

## 2015-01-10 DIAGNOSIS — R52 Pain, unspecified: Secondary | ICD-10-CM

## 2015-01-20 ENCOUNTER — Telehealth: Payer: Self-pay | Admitting: Nurse Practitioner

## 2015-01-20 ENCOUNTER — Ambulatory Visit: Payer: Commercial Managed Care - HMO | Admitting: Nurse Practitioner

## 2015-01-20 NOTE — Telephone Encounter (Signed)
Ok to cancel

## 2015-01-20 NOTE — Telephone Encounter (Signed)
Pt need to cancel appt for today due to car trouble. Please advise to cancel off schedule.msn

## 2015-01-28 NOTE — H&P (Signed)
PATIENT NAME:  [name] FillersROGERS, [name] L MR#:  [id number] DATE OF BIRTH:  07-16-1981  DATE OF ADMISSION:  05/29/2012  PRIMARY CARE PHYSICIAN[facility]  CARDIOLOGIST:  Dr. Mariah MillingGollan   CHIEF COMPLAINT: Chest pain.   HISTORY OF PRESENT ILLNESS: This is a 34 year old male who presents to the Emergency Room complaining of an uncomfortable feeling in the center of his chest that began around 8:30 this morning. The patient said that he was driving when he developed these symptoms. He also developed some bilateral arm heaviness and became a little bit lightheaded and nauseated. He was therefore a bit concerned and came to the ER for further evaluation.  His symptoms presently are very similar to when he had his previous myocardial infarction as he has a history of  bypass surgery.   Given his risk factors, the hospitalist service was then contacted for further the treatment and evaluation. The patient presently is still complaining of some mild chest heaviness but no shortness of breath. No nausea, no vomiting, no diaphoresis, no palpitations, no syncope. No other associated symptoms presently.   REVIEW OF SYSTEMS: CONSTITUTIONAL: No documented fever. No weight gain or weight loss. EYES: No blurred or double vision. ENT: No tinnitus. No postnasal drip. No redness of the oropharynx.  RESPIRATORY: No cough, no wheeze, no hemoptysis. CARDIOVASCULAR: Positive chest pain/pressure. No orthopnea, no palpitations, no syncope.  GI: No nausea, no vomiting, no diarrhea, no abdominal pain, no melena, no hematochezia.  GU: No dysuria or hematuria. ENDOCRINE: No polyuria or nocturia. No heat or cold intolerance. HEME: No anemia, no bruising, no bleeding. INTEGUMENT: No rashes. No lesions. MUSCULOSKELETAL: No arthritis, no swelling, no gout. NEUROLOGIC: No numbness, no tingling, no ataxia, no seizure-type activity.  PSYCH: No anxiety, no insomnia, no ADD.   PAST MEDICAL HISTORY:  1. Coronary artery  disease, status post coronary artery bypass graft.  2. Diabetes.  3. Hyperlipidemia.   ALLERGIES: No known drug allergies.   SOCIAL HISTORY: No smoking. Occasional alcohol use. No illicit drug abuse. Lives at home with his wife.   FAMILY HISTORY: Mother is alive and healthy. Father died from complications of emphysema.   CURRENT MEDICATIONS:  1. Aspirin 81 mg daily.  2. Crestor 20 mg daily.  3. Metformin 1000 mg b.i.d.  4. Novolin 70/30, 30 units in the morning and 20 units in the evening.   PHYSICAL EXAMINATION: VITAL SIGNS: Temperature 97,  pulse 82, respirations 16, blood pressure 139/81, sats 100% on room air.   GENERAL: He is a pleasant appearing male, a bit lethargic but in no apparent distress.   HEENT: Atraumatic, normocephalic. Extraocular muscles are intact. Pupils are equal and reactive to light. Sclerae anicteric. No conjunctival injection. No pharyngeal erythema.   NECK: Supple. No jugular venous distention, no bruits, no lymphadenopathy or thyromegaly.   HEART: Regular rate and rhythm. No murmurs, no rubs, and no clicks.   LUNGS: Clear to auscultation bilaterally. No rales, no rhonchi, no wheezes.   ABDOMEN: Soft, flat, nontender, nondistended. Has good bowel sounds. No hepatosplenomegaly appreciated.   EXTREMITIES: No evidence of any cyanosis, clubbing, or peripheral edema. Has +2 pedal and radial pulses bilaterally.   NEUROLOGICAL: The patient is alert, awake, and oriented times three with no focal motor or sensory deficits appreciated bilaterally.   SKIN: Moist and warm with no rash appreciated.   LYMPHATIC: There is no cervical or axillary lymphadenopathy.   LABORATORY, DIAGNOSTIC AND RADIOLOGICAL DATA: Serum glucose of 302, BUN 12, creatinine 0.6,  sodium 137, potassium 4, chloride 104, bicarbonate 26. The patient's white cell count is 5.4, hemoglobin 15.3, hematocrit 45.6, platelet count 261, troponin is less than 0.02. EKG showed normal sinus rhythm with  normal axis and no evidence of any ST or T-wave changes.   ASSESSMENT AND PLAN: This is a 34 year old male with history of coronary artery disease status post coronary artery bypass graft, diabetes, and hyperlipidemia who presents to the hospital with chest pain.  1. Chest pain:  The patient does have significant risk factors given his previous history of CABG and uncontrolled diabetes. His symptoms presently are very similar to when he had his previous myocardial infarction. Therefore, I will observe him overnight on telemetry, continue serial cardiac markers. Continue aspirin, nitroglycerin, and statin for now. We will get a cardiology consult. The patient is followed by the [facility]. The patient did have a Myoview in October 2011, which was normal. Therefore, I will not repeat that now until further evaluation from cardiology.  2. Diabetes: We will continue his Novolin 70/30 insulin for now, hold his metformin in case he were to get a stress test. Follow his blood sugars. Place him on a carb-controlled diet and check a hemoglobin A1c.  3. Hyperlipidemia: Continue Crestor.  4. History of coronary artery disease, status post coronary artery bypass graft. Continue aspirin, Crestor, and sublingual nitroglycerin as needed.  We will get a cardiology consult as mentioned. The patient has no acute EKG changes.   CODE STATUS: The patient is a FULL CODE.   TIME SPENT: 45 minutes.   ____________________________ Rolly Pancake. Cherlynn Kaiser, MD [id number] D:  05/29/2012 12:12:29 ET          T: 05/29/2012 12:28:11 ET         JOB#: [id number]  cc: Eustaquio Boyden, MD Houston Siren MD ELECTRONICALLY SIGNED 05/29/2012 15:20

## 2015-01-28 NOTE — Consult Note (Signed)
General Aspect 34 year old male with a history of CAD, four vessel CABG, hyperlipidemia, diabetes, and noncompliance with medications who presents with chest pain. Cardiology was consulted for further evaluation. last admission for chest pain was in 2012. Last stress test was in 2011. Last cardiac cath in 2010.  He presents to the Emergency Room complaining of an uncomfortable feeling in the center of his chest that began around 8:30 this morning. The patient said that he was driving when he developed these symptoms. Also  bilateral arm heaviness and became a little bit lightheaded and nauseated.   His symptoms presently are very similar to when he had his previous myocardial infarction as he has a history of  bypass surgery.    No nausea, no vomiting, no diaphoresis, no palpitations, no syncope. No other associated symptoms presently. Currently feels better. Denies any increase in stress.    Present Illness . ALLERGIES: No known drug allergies.   SOCIAL HISTORY: No smoking. Occasional alcohol use. No illicit drug abuse. Lives at home with his wife.   FAMILY HISTORY: Mother is alive and healthy. Father died from complications of emphysema.   Physical Exam:   GEN well developed, well nourished, no acute distress    HEENT red conjunctivae    NECK supple  No masses    RESP normal resp effort  clear BS    CARD Regular rate and rhythm    ABD denies tenderness  soft    LYMPH negative neck    EXTR negative edema    SKIN normal to palpation    NEURO motor/sensory function intact    PSYCH alert, A+O to time, place, person, good insight   Review of Systems:   Subjective/Chief Complaint Chest pain, arm tingling    General: No Complaints    Skin: No Complaints    ENT: No Complaints    Eyes: No Complaints    Neck: No Complaints    Respiratory: No Complaints    Cardiovascular: Chest pain or discomfort    Gastrointestinal: No Complaints    Genitourinary: No Complaints     Vascular: No Complaints    Musculoskeletal: No Complaints    Neurologic: No Complaints    Hematologic: No Complaints    Endocrine: No Complaints    Psychiatric: No Complaints    Review of Systems: All other systems were reviewed and found to be negative    Medications/Allergies Reviewed Medications/Allergies reviewed     Diabetes Mellitus,Type I (IDD):    mi:    Hypercholesterolemia:    HTN:    hyperlipidemia:    Diabetes Mellitus, Type II (NIDD):    Negative, Patient denies surgical history:        Admit Diagnosis:   CHEST PAIN: 29-May-2012, Active, CHEST PAIN  Home Medications: Medication Instructions Status  Crestor 20 mg oral tablet 1 tab(s) orally once a day Active  metformin 1000 mg oral tablet 1 tab(s) orally 2 times a day Active  aspirin 81 mg oral tablet 2 tab(s) orally once a day Active  Novolin 70/30 subcutaneous suspension 30 unit(s) subcutaneous once a day (in the morning) and 20 units once a day (in the evening) Active   Lab Results:  Routine Chem:  19-Aug-13 09:46    Glucose, Serum  302   BUN 12   Creatinine (comp) 0.69   Sodium, Serum 137   Potassium, Serum 4.0   Chloride, Serum 104   CO2, Serum 26   Calcium (Total), Serum 8.8   Anion Gap 7  Osmolality (calc) 285   eGFR (African American) >60   eGFR (Non-African American) >60 (eGFR values <63m/min/1.73 m2 may be an indication of chronic kidney disease (CKD). Calculated eGFR is useful in patients with stable renal function. The eGFR calculation will not be reliable in acutely ill patients when serum creatinine is changing rapidly. It is not useful in  patients on dialysis. The eGFR calculation may not be applicable to patients at the low and high extremes of body sizes, pregnant women, and vegetarians.)  Cardiac:  19-Aug-13 09:46    Troponin I < 0.02 (0.00-0.05 0.05 ng/mL or less: NEGATIVE  Repeat testing in 3-6 hrs  if clinically indicated. >0.05 ng/mL: POTENTIAL   MYOCARDIAL INJURY. Repeat  testing in 3-6 hrs if  clinically indicated. NOTE: An increase or decrease  of 30% or more on serial  testing suggests a  clinically important change)   CK, Total 166   CPK-MB, Serum 0.9 (Result(s) reported on 29 May 2012 at 10:22AM.)  Routine Hem:  19-Aug-13 09:46    WBC (CBC) 5.4   RBC (CBC) 5.33   Hemoglobin (CBC) 15.3   Hematocrit (CBC) 45.6   Platelet Count (CBC) 261 (Result(s) reported on 29 May 2012 at 10:09AM.)   MCV 86   MCH 28.8   MCHC 33.6   RDW 12.2   EKG:   Interpretation EKG shows NSR with no significant ST or T wave changes    Nitroglycerin: Other  Vital Signs/Nurse's Notes: **Vital Signs.:   19-Aug-13 18:41   Vital Signs Type Q 4hr   Temperature Temperature (F) 97.5   Celsius 36.3   Temperature Source Oral   Pulse Pulse 79   Respirations Respirations 18   Systolic BP Systolic BP 1354  Diastolic BP (mmHg) Diastolic BP (mmHg) 78   Mean BP 92   Pulse Ox % Pulse Ox % 96   Pulse Ox Activity Level  At rest   Oxygen Delivery Room Air/ 21 %     Impression [age]year old male with history of coronary artery disease status post coronary artery bypass graft, diabetes, and hyperlipidemia who presents to the hospital with chest pain.   1. Chest pain:    history of CABG and uncontrolled diabetes. Atypical tyupe sx. Options discussed with him. He would like to hold off on any stress testing at this time. Possibly consider outpt treadmill.   observe him overnight on telemetry, continue serial cardiac markers.  D/C in Am if enz negative and no more sx. Possible anxiety component. ---Need to restart crestor (stopped taking)  2. Diabetes:  on Novolin 70/30 insulin, metformin  3. Hyperlipidemia:  Continue Crestor.   4. History of coronary artery disease, status post coronary artery bypass graft.  Continue aspirin, Crestor, and sublingual nitroglycerin as needed.   The patient has no acute EKG changes. D/C in AM if stable.    Electronic Signatures: GIda Rogue(MD)  (Signed 19-Aug-13 18:55)  Authored: General Aspect/Present Illness, History and Physical Exam, Review of System, Past Medical History, Health Issues, Home Medications, Labs, EKG , Allergies, Vital Signs/Nurse's Notes, Impression/Plan   Last Updated: 19-Aug-13 18:55 by GIda Rogue(MD)

## 2015-01-28 NOTE — Discharge Summary (Signed)
PATIENT NAME:  [name], [name] MR#:  [id number] DATE OF BIRTH:  07-06-1981  DATE OF ADMISSION:  05/29/2012 DATE OF DISCHARGE:  05/30/2012  For a detailed note, please take a look at the history and physical done on admission.   DIAGNOSES AT DISCHARGE:  1. Chest pain, likely related to anxiety/stress mediated.  2. History of coronary artery disease, status post coronary artery bypass graft. 3. Diabetes. 4. Hyperlipidemia.   DIET: Patient is being discharged on an American Diabetic Association low fat diet.   ACTIVITY: As tolerated.   FOLLOW UP: Follow up is with Dr. Julien Nordmannimothy Gollan in the next 1 to 2 weeks.   DISCHARGE MEDICATIONS:   1. Aspirin 81 mg daily.  2. Crestor 20 mg daily.  3. Metformin 1000 mg b.i.d.  4. Novolin 70/30, 30 units in the morning, 20 units in the evening.   CONSULTANT DURING THE HOSPITAL COURSE: Dr. Julien Nordmannimothy Gollan from cardiology.   BRIEF HOSPITAL COURSE: This is a 34 year old male with past medical history as mentioned above presented to the hospital with an uncomfortable feeling in the center of his chest along with some left arm pain and dizziness.  1. Chest pain. Given patient's previous history of coronary artery disease and diabetes his symptoms were typical and suspicious for angina therefore he was observed overnight on telemetry, had three sets of cardiac markers checked which were negative. Patient had a stress test done about a couple of years ago which was normal. I did go ahead and obtain a cardiology consult, patient was seen by Dr. Mariah MillingGollan. As per him patient did not require an acute cardiac intervention presently. He recommended follow up as an outpatient and to consider outpatient treadmill stress test. Likely his chest pain had anxiety/stress component to it. Patient currently is chest pain free and hemodynamically stable and therefore is being discharged home.  2. Diabetes. Patient was continued on his 70/30 insulin and he will resume that along with  his metformin upon discharge.  3. Hyperlipidemia. Patient was maintained on his Crestor and will resume that upon discharge too.  4. CODE STATUS: Patient is a FULL CODE.   TIME SPENT WITH DISCHARGE: 35 minutes.  ____________________________ Rolly PancakeVivek J. Cherlynn KaiserSainani, MD vjs:cms D: 05/30/2012 14:13:06 ET T: 05/31/2012 10:06:07 ET JOB#: [id number]  cc: Rolly PancakeVivek J. Cherlynn KaiserSainani, MD, <Dictator> Antonieta Ibaimothy J. Gollan, MD [place] SirenVIVEK J SAINANI MD ELECTRONICALLY SIGNED 05/31/2012 12:54

## 2015-01-31 NOTE — Consult Note (Signed)
Brief Consult Note: Diagnosis: Anxiety.   Comments: I was unable to see the patient today. will return tomorrow am.  Electronic Signatures: Kristine LineaPucilowska, Deiona Hooper (MD)  (Signed 01-Sep-14 18:00)  Authored: Brief Consult Note   Last Updated: 01-Sep-14 18:00 by Kristine LineaPucilowska, Geraldine Tesar (MD)

## 2015-01-31 NOTE — H&P (Signed)
PATIENT NAME:  [name], [name] MR#:  [id number] DATE OF BIRTH:  1981-01-15  DATE OF ADMISSION:  06/12/2013 DATE OF DISCHARGE: 06/13/2013   NOTE: This is history and physical as well as discharge summary.  REFERRING PHYSICIAN: Vipul S. Manuella Ghazi, MD  ATTENDING PHYSICIAN: Demika Langenderfer B. Bary Leriche, MD  IDENTIFYING DATA: [name] is a 34 year old male with history of depression.   CHIEF COMPLAINT: "I don't want to wake up."   HISTORY OF PRESENT ILLNESS: [name] has a history of coronary artery disease with a quadruple bypass leading to his disability. He reports that since his surgery in 2007, he has never been the same. He complains of depression with poor sleep, decreased appetite, anhedonia, feeling of guilt, hopelessness, worthlessness, poor energy and concentration, social isolation, irritability and frequent suicidal ideation. He denies ever attempting suicide but is preoccupied with thoughts of dying, not waking up, going away, especially when his heart problems become more prominent. The patient was admitted to medical floor again for unstable angina and chest pain. His work-up was again negative. It is very likely that the patient experiences anxiety leading to multiple repeated hospitalizations and testing. He feels that he did not get a fair shake in life and he is in conflict with his whole family, who have very strong work Nurse, learning disability and believe the patient is a parasite living off disability. He does not go to church, as he believes that people at his church and in church in general are dishonest. He is married and has a child, but reports that lately he has very little joy from interacting with his son. He is short and irritable with him. He used to spend some time playing baseball with his son or watching his baseball games, but it gives him no pleasure anymore. He believes that he is not a good father and husband. He has never been physically abusive towards his wife or son, but does not believe  that he creates a loving atmosphere at home. He has not been compliant with his medications including insulin. He does not test his blood sugar. He infrequently take any medications whatsoever. He is not interested at this point in starting an antidepressant, but he inquires about therapy. He feels that maybe now it is time to start talking about his problems.   PAST PSYCHIATRIC HISTORY: He reports that he became depressed at the age of 24 but has never been treated. There are no suicide attempts. No hospitalizations. He denies alcohol or illicit substance use, but he did have a period of heavy drinking when young and well.   FAMILY PSYCHIATRIC HISTORY: None reported.   PAST MEDICAL HISTORY: Coronary artery disease, status post CABG, diabetes, dyslipidemia.   ALLERGIES: NITROGLYCERIN.   MEDICATIONS ON ADMISSION: Reportedly, the patient had been taking no medicines prior to admission to medical floor.   MEDICATIONS AT THE TIME OF TRANSFER FROM MEDICAL FLOOR: Aspirin 81 mg, atorvastatin 40 mg at bedtime, Novolin 70/30 with 30 units twice daily, metformin 1000 mg twice daily, Zoloft 25 mg at bedtime, trazodone.   SOCIAL HISTORY: He was employed as a Arts administrator. He is married, lives with his wife. Wife is employed. He has a son. He is on disability. He has Medicaid.   REVIEW OF SYSTEMS:    CONSTITUTIONAL: No fevers or chills. No weight changes.  EYES: No double or blurred vision.  ENT: No hearing loss.  RESPIRATORY: No shortness of breath or cough.  CARDIOVASCULAR: Positive for occasional chest pain.  GASTROINTESTINAL: No abdominal  pain, nausea, vomiting or diarrhea.  GENITOURINARY: No incontinence or frequency.  ENDOCRINE: No heat or cold intolerance.  LYMPHATIC: No anemia or easy bruising.  INTEGUMENTARY: No acne or rash.  MUSCULOSKELETAL: No muscle or joint pain.  NEUROLOGIC: No tingling or weakness.  PSYCHIATRIC: See history of present illness for details.   PHYSICAL EXAMINATION: VITAL  SIGNS: Blood pressure 143/87, pulse 80, respirations 18, temperature 98.1.  GENERAL: This is a well-developed male in no acute distress.  HEENT: The pupils are equal, round and reactive to light. Sclerae are anicteric.  NECK: Supple. No thyromegaly.  LUNGS: Clear to auscultation. No dullness to percussion.  HEART: Regular rhythm and rate. No murmurs, rubs or gallops.  ABDOMEN: Soft, nontender, nondistended. Positive bowel sounds.  MUSCULOSKELETAL: Normal muscle strength in all extremities.  SKIN: No rashes or bruises.  LYMPHATIC: No cervical adenopathy.  NEUROLOGIC: Cranial nerves II through XII are intact.   LABORATORY DATA: Chemistries are within normal limits. Blood glucose over 200. Cardiac enzymes negative. TSH 4.49. Urine tox screen negative for substances. CBC within normal limits. Urinalysis is not suggestive of urinary tract infection, over 500 glucose.   EKG: Normal sinus rhythm with nonspecific ST and T wave abnormality.   MENTAL STATUS EXAMINATION: On admission, the patient is alert and oriented to person, place, time and situation. He is pleasant, polite and cooperative. He is wearing private clothes. He maintains good eye contact. His speech is soft. I initially met him on the medical floor. He was a depressed with flat and tearful affect, wanting to come to psychiatry to start therapy and possibly medications. Today he is disappointed with admission to psychiatry. He believes that there is too much stimulation, that he was hoping for one-on-one treatment and is not able to participate in groups. He denies thoughts of hurting himself or others. There are no delusions or paranoia. There are no auditory or visual hallucinations. His cognition is grossly intact. He registers three out of three and recalls three out of three objects after five minutes. He can spell "world" forward and backward. He knows the current president. His insight and judgment are fair.   SUICIDE RISK ASSESSMENT:  This is a patient with a long history of untreated depression, who in general is treatment noncompliant and who reports a long history of depression with chronic suicidal ideation. He has never acted upon it and suicidal ideation passes. He never develops plan, no attempts, no rehearsal. He is agreeable not only to psychotherapy, but accepted prescribed medication. He has every intention to follow up with a provider in the community. He is forward thinking and optimistic about the future. He is a loving father and husband.   DIAGNOSES: AXIS I: Major depressive disorder, recurrent, severe.  AXIS II: Personality disorder, not otherwise specified.  AXIS III: Diabetes, coronary artery disease, status post coronary artery bypass grafting, dyslipidemia.  AXIS IV: Mental and physical illness, family conflict, primary support, treatment noncompliance.  AXIS V: Global assessment of functioning: 50.   PLAN: The patient was admitted to [facility] for safety, stabilization and medication management. He was initially placed on suicide precautions and was closely monitored for any unsafe behaviors. He underwent full psychiatric and risk assessment. He received pharmacotherapy, individual and group psychotherapy, substance abuse counseling, and support from therapeutic milieu.  1.  Suicidal ideation: The patient reports passive suicidal ideations with no intention or plan. This is not new. The patient has had similar thoughts since his coronary artery bypass  in 2007. He is able to contract for safety.  2.  Mood: The patient was started on Zoloft by [facility] on the medical floor. He tolerated well and agrees to increase the dose to 50 mg at night. We also started Abilify 2 mg daily with the patient's permission. Trazodone was used to help with sleep.  3.  Medical: We will continue all medication as prescribed on the medical floor.  4.  Diabetes: The patient was educated  about the importance of compliance with glucose-lowering drugs and consequences it may have to his health, including heart health.  5.  Anxiety: It is likely that the patient suffers severe anxiety leading to multiple hospital admissions. He was started on antidepressants and will enter therapy. He was encouraged to address his health issue with his therapist.  6.  Disposition: The patient was discharged to home with his wife.   MEDICATIONS AT THE TIME OF DISCHARGE: Zoloft 50 mg at bedtime, trazodone 50 mg at bedtime, Abilify 2 mg daily, metformin 1000 mg twice daily, insulin 70/30 with 30 units twice daily, Lipitor 40 mg daily, aspirin 81 mg daily.   ____________________________ Wardell Honour Bary Leriche, MD jbp:jm D: 06/13/2013 21:14:41 ET T: 06/13/2013 21:55:00 ET JOB#: [id number]  cc: Chosen Garron B. Bary Leriche, MD, <Dictator> Clovis Fredrickson MD ELECTRONICALLY SIGNED 06/19/2013 6:27

## 2015-01-31 NOTE — Discharge Summary (Signed)
PATIENT NAME:  [name], [name] MR#:  [id number] DATE OF BIRTH:  Aug 16, 1981  DATE OF ADMISSION:  06/11/2013 DATE OF DISCHARGE:  06/12/2013  For a detailed note, please see the history and physical done on admission by Dr. Randol KernElgergawy.   DIAGNOSES AT DISCHARGE: Are as follows:  1.  Chest pain, likely related to anxiety/depression. 2.  History of coronary artery disease, status post bypass. 3.  Hyperlipidemia. 4.  Diabetes. 5.  Medical noncompliance.  6.  Depression.   DISPOSITION: The patient was discharged to [facility].   DIET: The patient was discharged on a low-sodium, low-fat, American Diabetic Association diet.   ACTIVITY: As tolerated.   FOLLOWUP: With Dr. Kirke CorinArida in the next 1 to 2 weeks after he gets discharged from Behavioral Medicine. The patient also needs to get himself a primary care physician.   DISCHARGE MEDICATIONS: Aspirin 81 mg daily, metformin 1000 mg b.i.d., insulin 70/30, 30 units b.i.d. before meals, Zoloft 25 mg at bedtime and Lipitor 40 mg daily.   CONSULTANTS DURING HOSPITAL COURSE: Dr. Mariah MillingGollan from cardiology and Dr. Jennet MaduroPucilowska from psychiatry.   PERTINENT STUDIES DONE DURING THE HOSPITAL COURSE: Are as follows: A chest x-ray done on admission showing no acute cardiopulmonary disease. A nuclear medicine myocardial scan done on 09/02 showing small to moderate fixed perfusion defect in the mid inferolateral wall consistent with old scar, EF of 48%. No EKG changes concerning for ischemia. Overall a low risk scan.   BRIEF HOSPITAL COURSE: This is a 34 year old male with medical problems as mentioned above, presented to the hospital with chest pain.  1.  Chest pain. The patient does have significant risk factors given his previous history of coronary artery disease and bypass; therefore, was observed overnight on telemetry. He had 3 sets of cardiac markers that were negative. He underwent a nuclear medicine myocardial scan the day after admission, which was a low  risk scan showing no evidence of myocardial ischemia. His chest pain has since then resolved. The most likely cause of his chest pain was probably related to anxiety/depression.  2.  Depression. When the patient presented to the hospital, he asked for possibly speaking to a psychiatrist. A psychiatric consult was obtained. The patient was seen by Dr. Jennet MaduroPucilowska. After her evaluation, she thought the patient would benefit from inpatient psychiatric care. [facility] He was started on some Zoloft and further care is as per psychiatry.  3.  Diabetes. The patient's blood sugars were significantly uncontrolled. His A1c was 11.9, but he was noncompliant with his metformin and insulin and has not taken it for quite a while; therefore, he was currently being discharged back on his 70/30 insulin and metformin. He likely needs to get himself a primary care physician and be compliant with his meds prior to being discharged from Behavioral Medicine.  4.  Hyperlipidemia. The patient did have a lipid profile checked. His LDL was 194. His total cholesterol was 309. Again, this is probably secondary to medical noncompliance. He was on a statin prior to coming in. He was restarted back on his Lipitor prior to discharge.   CODE STATUS: The patient is a full code.   TIME SPENT ON DISCHARGE: 40 minutes.  ____________________________ Rolly PancakeVivek J. Cherlynn KaiserSainani, MD vjs:aw D: 06/13/2013 08:20:43 ET T: 06/13/2013 08:41:26 ET JOB#: [id number]  cc: Rolly PancakeVivek J. Cherlynn KaiserSainani, MD, <Dictator> Muhammad A. Kirke CorinArida, MD [place] SirenVIVEK J SAINANI MD ELECTRONICALLY SIGNED 06/23/2013 15:09

## 2015-01-31 NOTE — H&P (Signed)
PATIENT NAME:  [name], MOCTEZUMA MR#:  [id number] DATE OF BIRTH:  11/13/1980  DATE OF ADMISSION:  06/11/2013  REFERRING PHYSICIAN: Lucrezia Europe, MD  PRIMARY CARE PHYSICIAN: Dr. Sharen Hones at [facility].   CARDIOLOGIST: Julien Nordmann, MD  The patient has not been seeing his PCP or cardiologist for the last few months.   CHIEF COMPLAINT: Chest pain.   HISTORY OF PRESENT ILLNESS: This is a 34 year old male with known history of coronary artery disease status post CABG, diabetes mellitus and hyperlipidemia, noncompliance with meds or with his followups, has not been taking any meds for the last few months, who presents with complaints of chest pain today. Reports chest pain started this morning at rest when he was reading a book. As well, he reported it was pressure-like quality and tightness chest pain in the past when he had a MI. The patient did receive 324 of aspirin in the ED and nitro paste. Reports currently he is chest pain-free. His chest pain is gone, although he reports some mild nausea and sweating, but denies any vomiting, shortness of breath, palpitations or diaphoresis. The patient's first troponin was negative. His EKG did not show any significant finding from the last one, but given his risk factors hospitalist service was requested to admit the patient for further evaluation of his chest pain.   PAST MEDICAL HISTORY: 1.  Coronary artery disease status post CABG.  2.  Diabetes mellitus.  3.  Hyperlipidemia.   ALLERGIES: According to medical records, allergic to nitroglycerin, but upon further questioning he reports this caused him to have nausea. It does not appear to be a true allergy as he tolerated nitro paste in the ED.   SOCIAL HISTORY: He quit smoking in 2007. Occasional alcohol use. No illicit drug abuse. On disability. Lives at home with his wife. He has 1 son.   FAMILY HISTORY: No significant history of coronary artery disease at young age. Significant for  emphysema in his father.   HOME MEDICATIONS: The patient has not been taking any medications at home for the last few months.   REVIEW OF SYSTEMS: CONSTITUTIONAL: The patient denies fever, chills, fatigue, weakness, pain, weight gain, weight loss.  EYES: Denies blurry vision, double vision, glaucoma or cataracts.  ENT: Denies tinnitus, epistaxis, ear pain, discharge or hearing loss.  RESPIRATORY: Denies cough, wheezing, hemoptysis, dyspnea or COPD. CARDIOVASCULAR: Has complaints of pressure-like chest pain, currently resolved. Denies orthopnea edema, arrhythmia or palpitations.  GASTROINTESTINAL: Has complaints of nausea, but denies any vomiting, diarrhea, abdominal pain, hematemesis, diarrhea or constipation.  GENITOURINARY: Denies dysuria, hematuria or renal colic.  ENDOCRINE: Denies polyuria, polydipsia, heat or cold intolerance.  HEMATOLOGY: Denies anemia, easy bruising or bleeding diathesis.  INTEGUMENTARY: Denies acne, rash or skin lesions.  MUSCULOSKELETAL: Denies neck pain, back pain, arthritis, cramps or swelling.  NEUROLOGIC: Denies numbness, tremors, weakness, dysarthria, CVA or TIA.  PSYCHIATRIC: Reports history of anxiety. Denies substance or alcohol abuse. Denies schizophrenia.   PHYSICAL EXAMINATION: VITAL SIGNS: Pulse 78, respiratory rate 16, blood pressure 132/79 and saturating 96% on room air.  GENERAL: Young male who lies comfortable and in no apparent distress.  HEENT: Head atraumatic, normocephalic. Pupils equal and reactive to light. Pink conjunctivae. Anicteric sclerae. Moist oral mucosa.  NECK: Supple. No thyromegaly. No JVD.  LUNGS: Good air entry bilaterally. No wheezing, rales or rhonchi.  HEART: S1 and S2 heard. No rubs, murmurs or gallops. No carotid bruits.  ABDOMEN: Soft, nontender, nondistended. Bowel sounds present.  EXTREMITIES: No edema.  No clubbing. No cyanosis. Dorsalis pedis pulses +2. Radial pulses +2.  PSYCHIATRIC: Appropriate affect. Awake and  alert x 3. Intact judgment and insight. Denies any suicidal or homicidal ideation.  NEUROLOGIC: Cranial nerves grossly intact. Motor 5/5. No focal deficits.  LYMPHATICS: No cervical or supraclavicular lymphadenopathy.   PERTINENT LABORATORY AND DIAGNOSTICS: Glucose 488, BUN 13, creatinine 0.89, sodium 132, potassium 3.9, chloride 97, CO2 27. TSH 4.49. Troponin less than 0.02. Urine drugs negative for all drugs. White blood cells 7.3, hemoglobin 15.6, hematocrit 45.5, platelets 257. Urinalysis negative for leukocyte esterase, but positive for glucose with no ketones.   EKG showing normal sinus rhythm at 88 beats per minute with no ST or T wave changes from last EKG in July 2014.  ASSESSMENT AND PLAN: 1.  Chest pain. The patient has significant risk factors including his history of coronary artery disease with coronary artery bypass graft, as well as uncontrolled diabetes and hyperlipidemia. The patient will be admitted to telemetry. We will continue to cycle his cardiac enzymes and follow the trend. Will continue him on aspirin and nitroglycerin and statin. Will schedule the patient for stress test treadmill in a.m.  2.  Diabetes mellitus. The patient is not taking any home meds. Appears to be uncontrolled. He already received 10 of insulin in the ED. Will resume him on insulin sliding scale. Most likely he will need to be on metformin on discharge. We will check his Hemoglobin A1c. 3.  Hyperlipidemia. We will check lipid panel. Will start him back on Crestor.  4.  History of coronary artery disease status post coronary artery bypass graft.  Will continue the patient on aspirin, Crestor and nitro paste.  5.  Deep vein thrombosis prophylaxis. Sub-Q heparin.   CODE STATUS: The patient is FULL CODE.   TOTAL TIME SPENT ON ADMISSION AND PATIENT CARE: 50 minutes.  ____________________________ Starleen Armsawood S. Arine Foley, MD dse:sb D: 06/11/2013 04:27:05 ET T: 06/11/2013 09:47:26 ET JOB#: [id number]  cc: Starleen Armsawood  S. Kreig Parson, MD, <Dictator> Leahann Lempke Teena IraniS Jameya Pontiff MD ELECTRONICALLY SIGNED 06/12/2013 2:54

## 2015-01-31 NOTE — Consult Note (Signed)
Brief Consult Note: Diagnosis: Major depressive disorder recurrent severe.   Patient was seen by consultant.   Recommend further assessment or treatment.   Discussed with Attending MD.   Comments: [name] has a long h/o untreated depression with passive suicidal ideation.   PLAN: 1. Will transfer to psychiatry when medically cleared. The patient and his wife agree with the plan.  Electronic Signatures: Kristine LineaPucilowska, Tywon Niday (MD)  (Signed 02-Sep-14 11:30)  Authored: Brief Consult Note   Last Updated: 02-Sep-14 11:30 by Kristine LineaPucilowska, Iran Rowe (MD)

## 2015-01-31 NOTE — Consult Note (Signed)
General Aspect Primary cardiologist: Dr. Rockey Situ  34 year old male with a history of CAD, four vessel CABG in 2008, hyperlipidemia, diabetes, and noncompliance with medications who presents with chest pain. Cardiology was consulted for further evaluation. last admission for chest pain was in 2013. Last stress test was in May of 2013 at [facility] which showed inferolateral scar with no ischemia and normal EF. Last cardiac cath in 2010.  He presents to the Emergency Room complaining of an uncomfortable feeling in the center of his chest yesterday which lasted for hours and radiated to left arm. It felt as heavy pressure with dyspnea. IT was not triggered by activites.    His symptoms presently are very similar to when he had his previous myocardial infarction as he has a history of  bypass surgery. He is still having mild discomfort.  No recent exertional symptoms. He has not been taking his diabetes and hyperlipidemia medications.   Present Illness . ALLERGIES: No known drug allergies.   SOCIAL HISTORY: No smoking. Occasional alcohol use. No illicit drug abuse. Lives at home with his wife and son.   FAMILY HISTORY: Mother is alive and healthy. Father died from complications of emphysema.   Physical Exam:  GEN well developed, well nourished, no acute distress   HEENT red conjunctivae   NECK supple  No masses   RESP normal resp effort  clear BS   CARD Regular rate and rhythm   ABD denies tenderness  soft   LYMPH negative neck   EXTR negative edema   SKIN normal to palpation   NEURO motor/sensory function intact   PSYCH alert, A+O to time, place, person, good insight   Review of Systems:  Subjective/Chief Complaint Chest pain, arm tingling   General: No Complaints   Skin: No Complaints   ENT: No Complaints   Eyes: No Complaints   Neck: No Complaints   Respiratory: No Complaints   Cardiovascular: Chest pain or discomfort   Gastrointestinal: No Complaints    Genitourinary: No Complaints   Vascular: No Complaints   Musculoskeletal: No Complaints   Neurologic: No Complaints   Hematologic: No Complaints   Endocrine: No Complaints   Psychiatric: No Complaints   Review of Systems: All other systems were reviewed and found to be negative   Medications/Allergies Reviewed Medications/Allergies reviewed     TIA:    Diabetes Mellitus,Type I (IDD):    mi:    Hypercholesterolemia:    HTN:    hyperlipidemia:    Diabetes Mellitus, Type II (NIDD):    Quadruple Bypass:    Negative, Patient denies surgical history:   Lab Results: Thyroid:  01-Sep-14 00:10   Thyroid Stimulating Hormone 4.49 (0.45-4.50 (International Unit)  ----------------------- Pregnant patients have  different reference  ranges for TSH:  - - - - - - - - - -  Pregnant, first trimetser:  0.36 - 2.50 uIU/mL)  Routine Chem:  01-Sep-14 00:10   Hemoglobin A1c ([facility])  11.9 (The American Diabetes Association recommends that a primary goal of therapy should be <7% and that physicians should reevaluate the treatment regimen in patients with HbA1c values consistently >8%.)  Cholesterol, Serum  309  Triglycerides, Serum  390  HDL (INHOUSE)  37  VLDL Cholesterol Calculated  78  LDL Cholesterol Calculated  194 (Result(s) reported on 11 Jun 2013 at 04:34AM.)  Glucose, Serum  488  BUN 13  Creatinine (comp) 0.89  Sodium, Serum  132  Potassium, Serum 3.9  Chloride, Serum  97  CO2, Serum  27  Calcium (Total), Serum 9.5  Anion Gap 8  Osmolality (calc) 286  eGFR (African American) >60  eGFR (Non-African American) >60 (eGFR values <56m/min/1.73 m2 may be an indication of chronic kidney disease (CKD). Calculated eGFR is useful in patients with stable renal function. The eGFR calculation will not be reliable in acutely ill patients when serum creatinine is changing rapidly. It is not useful in  patients on dialysis. The eGFR calculation may not be applicable to  patients at the low and high extremes of body sizes, pregnant women, and vegetarians.)  Urine Drugs:  [id number]  Tricyclic Antidepressant, Ur Qual (comp) NEGATIVE (Result(s) reported on 11 Jun 2013 at 02:08AM.)  Amphetamines, Urine Qual. NEGATIVE  MDMA, Urine Qual. NEGATIVE  Cocaine Metabolite, Urine Qual. NEGATIVE  Opiate, Urine qual NEGATIVE  Phencyclidine, Urine Qual. NEGATIVE  Cannabinoid, Urine Qual. NEGATIVE  Barbiturates, Urine Qual. NEGATIVE  Benzodiazepine, Urine Qual. NEGATIVE (----------------- The URINE DRUG SCREEN provides only a preliminary, unconfirmed analytical test result and should not be used for non-medical  purposes.  Clinical consideration and professional judgment should be  applied to any positive drug screen result due to possible interfering substances.  A more specific alternate chemical method must be used in order to obtain a confirmed analytical result.  Gas chromatography/mass spectrometry (GC/MS) is the preferred confirmatory method.)  Methadone, Urine Qual. NEGATIVE  Cardiac:  01-Sep-14 00:10   CK, Total 60  CPK-MB, Serum 0.8 (Result(s) reported on 11 Jun 2013 at 12:40AM.)  Troponin I < 0.02 (0.00-0.05 0.05 ng/mL or less: NEGATIVE  Repeat testing in 3-6 hrs  if clinically indicated. >0.05 ng/mL: POTENTIAL  MYOCARDIAL INJURY. Repeat  testing in 3-6 hrs if  clinically indicated. NOTE: An increase or decrease  of 30% or more on serial  testing suggests a  clinically important change)    07:55   CK, Total 54  CPK-MB, Serum 0.9 (Result(s) reported on 11 Jun 2013 at 08:36AM.)  Troponin I < 0.02 (0.00-0.05 0.05 ng/mL or less: NEGATIVE  Repeat testing in 3-6 hrs  if clinically indicated. >0.05 ng/mL: POTENTIAL  MYOCARDIAL INJURY. Repeat  testing in 3-6 hrs if  clinically indicated. NOTE: An increase or decrease  of 30% or more on serial  testing suggests a  clinically important change)  Routine UA:  01-Sep-14 01:04   Color (UA)  Straw  Clarity (UA) Clear  Glucose (UA) >=500  Bilirubin (UA) Negative  Ketones (UA) Negative  Specific Gravity (UA) 1.033  Blood (UA) Negative  pH (UA) 7.0  Protein (UA) Negative  Nitrite (UA) Negative  Leukocyte Esterase (UA) Negative (Result(s) reported on 11 Jun 2013 at 02:07AM.)  RBC (UA) <1 /HPF  WBC (UA) <1 /HPF  Bacteria (UA) NONE SEEN  Epithelial Cells (UA) NONE SEEN  Result(s) reported on 11 Jun 2013 at 02:07AM.  Routine Hem:  01-Sep-14 00:10   WBC (CBC) 7.3  RBC (CBC) 5.19  Hemoglobin (CBC) 15.6  Hematocrit (CBC) 45.5  Platelet Count (CBC) 257 (Result(s) reported on 11 Jun 2013 at 12:25AM.)  MCV 88  MCH 30.0  MCHC 34.3  RDW 12.6   EKG:  EKG Interp. by me   Interpretation EKG shows NSR with no significant ST or T wave changes   Radiology Results: XRay:    01-Sep-14 00:50, Chest PA and Lateral  Chest PA and Lateral   REASON FOR EXAM:    Chest Pain  COMMENTS:       PROCEDURE: DXR - DXR CHEST PA (OR AP) AND  LATERAL  - Jun 11 2013 12:50AM     RESULT: Comparison is made to the previous exam of 04/10/2013.    Sternotomy wires present. The lungs are clear. The heart and pulmonary   vessels are normal. The bony and mediastinal structures are unremarkable.   There is no effusion. There is no pneumothorax or evidence of congestive   failure.    IMPRESSION:  No acute cardiopulmonary disease. Stable appearance.    Dictation Site: 6    Verified By: Sundra Aland, M.D., MD    Nitroglycerin: Other  Vital Signs/Nurse's Notes: **Vital Signs.:   01-Sep-14 08:06  Vital Signs Type Routine  Temperature Temperature (F) 97.8  Celsius 36.5  Temperature Source oral  Pulse Pulse 73  Respirations Respirations 17  Systolic BP Systolic BP 829  Diastolic BP (mmHg) Diastolic BP (mmHg) 71  Mean BP 83  Pulse Ox % Pulse Ox % 97  Pulse Ox Activity Level  At rest  Oxygen Delivery Room Air/ 21 %    Impression 34 year old male with history of coronary artery disease  status post coronary artery bypass graft, diabetes, and hyperlipidemia who presents to the hospital with chest pain.   1. Chest pain:  possible unstable angina. No recent exertional symptoms.   history of CABG and uncontrolled diabetes/ hyperlipidemia. Recommend a treadmil Myoview tomorrow.   I had a prolonged discussion with him about importance of taking his medications and controlling risk factors. He is at significant risk for graft failure. This was discussed with him and family.   2. Diabetes:  Hgb A1c was 11.9. He does not like using insulin injections due to pain. Consider outpatient endocinology consult for other options.   3. Hyperlipidemia:  LDL: 197 . Resume Crestor.   4. History of coronary artery disease, status post coronary artery bypass graft.  Continue aspirin, Crestor.   Electronic Signatures: Kathlyn Sacramento (MD)  (Signed 01-Sep-14 13:14)  Authored: General Aspect/Present Illness, History and Physical Exam, Review of System, Past Medical History, Labs, EKG , Radiology, Allergies, Vital Signs/Nurse's Notes, Impression/Plan   Last Updated: 01-Sep-14 13:14 by Kathlyn Sacramento (MD)

## 2015-02-01 NOTE — Discharge Summary (Signed)
PATIENT NAME:  [name], [name] DATE OF BIRTH:  April 24, 1981  DATE OF ADMISSION:  07/18/2014 DATE OF DISCHARGE:  07/20/2014  ADMISSION DIAGNOSIS: Prostatic abscess.   DISCHARGE DIAGNOSES: 1.  Methicillin sensitive Staph aureus prostatic abscess.  2.  Urinary retention.  3.  History of diabetes, Type 2, without any complications.   4.  History of  CAD  CONSULTATIONS: Urology.   PROCEDURES: The patient underwent a percutaneous drainage on 07/18/2014 by interventional radiology.   PERTINENT CULTURES:  1.  Blood culture was negative to date.  2.  Prostatic abscess culture was positive for MSSA.   LABORATORY DATA: White blood cells 9.3, hemoglobin 13, hematocrit 39, platelets are 330,000.  HOSPITAL COURSE: The patient is a  34 year old male who was treated for prostatitis as an outpatient on several antibiotics, found to have a prostatic abscess. He was admitted for his prostatic abscess and subsequently had a percutaneous drain placed by IR. For further details, please refer to the H and P.  1.  Prostatic abscess. The patient as mentioned and had a percutaneous drain placed by interventional radiology. He will continue with this drain until seen by [name] BostonBrandon Allison, NP, on Monday or Tuesday. His culture from the prostate abscess actually is MSSA, so he may be discharged with ciprofloxacin, which he was already taking.  2.  Urinary retention. The patient had a Foley in place. He passed his voiding trial and therefore does not need a Foley. 3.  Type 2 diabetes without complications. The patient may resume his outpatient medications.   4.  History of coronary artery disease and myocardial infarction. The patient is on aspirin, which he may restart.   DISCHARGE MEDICATIONS:  1.  Aspirin 81 mg 2 tablets daily.  2.  Linagliptin/metformin 2.5/500 daily.  3.  Acetaminophen oxycodone 325/5 mg 1-2 tablets q. 6 hours p.r.n. pain.  4.  Docusate 2 tablets at bedtime.  5.  Ciprofloxacin 500  mg q. 12 hours x 14 days.   DRAIN CARE: The patient will continue his percutaneous drain. He will monitor input and empty daily. The nurse is to teach him this.   DISCHARGE DIET: Low sodium, ADA diet.   DISCHARGE ACTIVITY: As tolerated.   DISCHARGE FOLLOWUP: The patient will follow up with [name] BostonBrandon  Allison, NP, on Monday. Until then, he will have his percutaneous drain in place.   PHYSICAL EXAMINATION:  VITAL SIGNS: On exam, temperature is 97.5, pulse is 92, respirations 17, blood pressure 133/87, 98% on room air.  GENERAL: The patient was alert, oriented, in no acute distress.  CARDIOVASCULAR: Regular rate and rhythm. No murmurs, gallops, or rubs. PMI is not displaced.  LUNGS: Clear to auscultation without crackles, rales, rhonchi, or wheezing. Normal to percussion.  BACK: No CVA or vertebral tenderness.  ABDOMEN: Bowel sounds are positive. No tenderness hepatosplenomegaly. No rebound, guarding.  EXTREMITIES: No clubbing, cyanosis or edema.  NEUROLOGIC: Cranial nerves II through XII are intact. There are no focal deficits.   DISPOSITION: The patient is stable for discharge.  TIME 45 minutes  ____________________________ [name] Voong P. Juliene PinaMody, MD spm:MT D: 07/20/2014 12:17:17 ET T: 07/20/2014 13:46:06 ET JOB#: [id number]  cc: [name] Crespin P. Juliene PinaMody, MD, <Dictator> [name] BostonBrandon Allison, NP  [name] ContesSITAL P Rachna Schonberger MD ELECTRONICALLY SIGNED 07/20/2014 21:24

## 2015-02-01 NOTE — Consult Note (Signed)
PATIENT NAME:  [name], [name] MR#:  [id number] DATE OF BIRTH:  1981-02-05  DATE OF CONSULTATION:  07/18/2014  REFERRING PHYSICIAN:   CONSULTING PHYSICIAN:  Claris Gladden, MD  CHIEF COMPLAINT:  Prostate abscess.    HISTORY OF PRESENT ILLNESS:  [name] is a 34 year old male who presented yesterday to [facility] after Emergency Room followup on 07/16/2014. He reports a 3 week history of worsening urinary urgency, frequency, and difficulty voiding. He was treated with multiple antibiotics including Bactrim, Cipro, as well as Flomax and tramadol. He ultimately presented to the Emergency Room with urinary retention and elevated PVR of 600 mL, at which time a Foley catheter was placed.  He underwent a CT scan of the pelvis which demonstrated a significant size prostatic abscess measuring 5.6 x 3.3 x 2.3. He had a borderline leukocytosis of 12.4 and UA was relatively unremarkable. He was discharged with close urology followup.   Upon imaging review the decision was made to have the patient admitted for percutaneous drainage of the prostatic abscess given its large size and minimal response to p.o. antibiotics. The case was discussed with  interventional radiology, who agreed to proceed with percutaneous drainage. Care was coordinated with the patient as well as medicine in order to admit the patient to facilitate this procedure today.   The patient today reports diffuse pelvic and penile pain. He has a Foley catheter in place. No fevers or chills, no nausea or vomiting. He has been n.p.o. since early this morning in anticipation of this procedure.    PAST MEDICAL HISTORY:  1.  Hypertension.   2.  Coronary artery disease.  3.  TIA.   4.  History of MI at age 30.  5.  Type 2 diabetes.  6.  Hyperlipidemia.   ALLERGIES: NITROGLYCERIN.    MEDICATIONS:  Linagliptin-metformin 2.5-500 mg p.o. daily, docusate senna 2 tabs at bedtime, Levaquin 500 daily, Cipro 500 b.i.d., aspirin 81 mg 2 tabs,  Percocet 5-325 1-2 tabs q. 6  p.r.n.  SOCIAL HISTORY: Former smoker, quit just prior to his MI at age 50.  Sexually active with 1 partner.  No smoking. No illicit drugs. No history of IV drug use, alcohol abuse.   REVIEW OF SYSTEMS:  CONSTITUTIONAL:  Reports overall weakness without fevers or chills.  HEENT: Normal vision.   RESPIRATORY: No shortness of breath.   CARDIOVASCULAR: No chest pain.   GASTROINTESTINAL: No nausea, vomiting, diarrhea, or abdominal pain.   GENITOURINARY: Significant for difficulty voiding and penile pain as per HPI above. ENDOCRINE: Significant for poorly controlled blood sugars.   HEMATOLOGIC:  No easy bruising.   SKIN: No rash. He does report multiple lesions on his lower extremities from fire ant bites. NEUROLOGIC:  No weakness or slurred speech.  PSYCHIATRIC: No anxiety or .    All other symptoms are negative as per HPI.    PHYSICAL EXAMINATION:   GENERAL:  The  patient is awake, in hospital bed, in no acute distress.  VITAL SIGNS:  Afebrile, pulse 89, blood pressure 134/83, saturation 100% on room air.   HEENT Atraumatic, normocephalic.  LUNGS: No respiratory distress, no increased work of breathing.  HEART:  No lower extremity edema, clubbing, or cyanosis.  ABDOMEN: Soft, nontender, nondistended. No CVA tenderness bilaterally.  SKIN: Multiple scabbed areas on the lower extremities consistent with a history of fire ant bites.  No surrounding cellulitis.  GENITOURINARY: Penile Foley catheter in place. No discharge or drainage around the catheter noted. Urine is clear  without debris or clots. No evidence of gross hematuria. No scrotal edema or tenderness. Bilateral testicles present without masses or lesions.  Testicles nontender. RECTAL: Deferred today due to presence of large abscess and concern for possible seeding. NEUROLOGIC: Grossly intact.  PSYCHIATRIC: Alert and oriented x 3.    LABORATORY DATA:  No current laboratories today for review. Urine and  blood cultures pending. Previous UA in the ED unremarkable.    IMAGING: CT scan reviewed which shows a large prostatic abscess as described above. Please see report for further details.   ASSESSMENT AND PLAN: This is a 34 year old male with a large prostatic abscess who has failed management with oral antibiotics. As such I have arranged for admission today to medicine as well as percutaneous drainage of the abscess and drain placement with interventional radiology. I discussed with the patient his options including percutaneous drainage versus transurethral drainage, however I did explain to him that there is a possibility for ejaculatory dysfunction and possible issues with fertility in the setting transurethral drainage, therefore would highly consider percutaneous drainage given his age. He is agreeable with this plan. Advised blood and urine cultures as well as cultures to be collected the time of abscess drainage for gram stain and fluid culture. Recommend IV antibiotics in the form of empiric Levaquin and ceftriaxone and adjust as appropriate based on culture and sensitivity data. Recommend maintaining Foley catheter for some period of time given urinary retention in the setting of abscess. We will follow this patient closely.   ____________________________ Claris GladdenAshley J. Nyesha Cliff, MD ajb:bu D: 07/18/2014 16:37:16 ET T: 07/18/2014 16:52:08 ET JOB#: [id number]  cc: Claris GladdenAshley J. Queen Abbett, MD, <Dictator> Claris GladdenASHLEY J Hena Ewalt MD ELECTRONICALLY SIGNED 07/21/2014 9:07

## 2015-02-01 NOTE — Consult Note (Signed)
Chief Complaint:  Subjective/Chief Complaint s/p prostatic drain placement, 25 ml purulent material drained.  Wound culture growing staph, sensitivities pending.  Vancomycin added.  Foley remains in place.  No evers or chills.  Reports decreased pelvic pain and pressure since drain placement, feeling better.   VITAL SIGNS/ANCILLARY NOTES: **Vital Signs.:   09-Oct-15 07:38  Vital Signs Type Q 8hr  Temperature Temperature (F) 97.8  Celsius 36.5  Temperature Source oral  Pulse Pulse 85  Respirations Respirations 19  Systolic BP Systolic BP 811  Diastolic BP (mmHg) Diastolic BP (mmHg) 78  Mean BP 93  Pulse Ox % Pulse Ox % 96  Pulse Ox Activity Level  At rest  Oxygen Delivery Room Air/ 21 %  *Intake and Output.:   Shift 09-Oct-15 15:00  Grand Totals Intake:  855.6 Output:  1700    Net:  -844.4 24 Hr.:  -844.4  Oral Intake      In:  0  IV (Primary)      In:  850.6  Other Intake cc     In:  5  Urine ml     Out:  1700  Length of Stay Totals Intake:  1648.6 Output:  3830    Net:  -2181.4   Brief Assessment:  GEN well developed, well nourished, no acute distress   Cardiac Regular   Respiratory normal resp effort   Gastrointestinal Normal   Gastrointestinal details normal Soft  Nontender  Nondistended  No masses palpable  No rebound tenderness  No gaurding  No rigidity   EXTR negative cyanosis/clubbing   Additional Physical Exam Foley in place draining clear yellow urine  Prostatic drain in place into right gluteus, c/d/i site.  Draining sangenous/ purulent drainaged, approximately 15 ml in drainage bag.   Lab Results: BF Analysis:  08-Oct-15 15:50   Body Fluid Source (CC) MISCELLANEOUS SOURCE  Color - (BF) AMBER  Clarity (BF) CLOUDY  NCC  (nucleated cell count) [id number]  Neutrophils  (BF) 100  Routine Micro:  08-Oct-15 11:15   Micro Text Report BLOOD CULTURE   COMMENT                   NO GROWTH IN 18-24 HOURS   ANTIBIOTIC                       Culture Comment  NO GROWTH IN 18-24 HOURS  Result(s) reported on 19 Jul 2014 at 11:00AM.    11:25   Micro Text Report BLOOD CULTURE   COMMENT                   NO GROWTH IN 18-24 HOURS   ANTIBIOTIC                       Culture Comment NO GROWTH IN 18-24 HOURS  Result(s) reported on 19 Jul 2014 at 11:00AM.    11:30   Micro Text Report URINE CULTURE   COMMENT                   NO GROWTH IN 8-12 HOURS   ANTIBIOTIC                       Specimen Source INDWELLING CATHETER  Culture Comment NO GROWTH IN 8-12 HOURS  Result(s) reported on 19 Jul 2014 at 11:24AM.    15:50   Organism Name [place]  Micro Text Report  BODY FLUID CULTURE   ORGANISM 1                HEAVY GROWTH STAPHYLOCOCCUS AUREUS   COMMENT                   SENSITIVITIES TO FOLLOW   ANTIBIOTIC                       Specimen Source prostate abscess  Organism 1 HEAVY GROWTH STAPHYLOCOCCUS AUREUS  Culture Comment SENSITIVITIES TO FOLLOW  Result(s) reported on 19 Jul 2014 at 10:44AM.  Routine Chem:  09-Oct-15 11:22   Creatinine (comp) 0.84  eGFR (African American) >60  eGFR (Non-African American) >60 (eGFR values <86m/min/1.73 m2 may be an indication of chronic kidney disease (CKD). Calculated eGFR, using the MRDR Study equation, is useful in  patients with stable renal function. The eGFR calculation will not be reliable in acutely ill patients when serum creatinine is changing rapidly. It is not useful in patients on dialysis. The eGFR calculation may not be applicable to patients at the low and high extremes of body sizes, pregnant women, and vetetarians.)   Radiology Results: CT:    08-Oct-15 16:08, CT Guide for Abscess Drainage (Specify)  CT Guide for Abscess Drainage (Specify)   REASON FOR EXAM:    prosate absecess  COMMENTS:       PROCEDURE: CT  - CT GUIDED ABSCESS DRAINAGE  - Jul 18 2014  4:08PM     CLINICAL DATA:  5 cm prostate abscess. Percutaneous drainage  is  Requested to avoid possible long-term complications from surgical  approach.    EXAM:  CT GUIDED DRAINAGE OF PROSTATE ABSCESS    ANESTHESIA/SEDATION:  Intravenous Fentanyl and Versed were administered as conscious  sedation during continuous cardiorespiratory monitoring by the  radiology RN, with a total moderate sedation time of 25 minutes.    PROCEDURE:  The procedure, risks, benefits, and alternatives were explained to  the patient. Questions regarding the procedure were encouraged and  answered. The patient understands and consents to the procedure.    Patient placed prone. Select axial scans through the lower pelvis  were obtained. The prostatic collection was localized and an  appropriate skin entry site determined. Skin site was marked.    The operative field was prepped with chlorhexidinein a sterile  fashion, and a sterile drape was applied covering the operative  field. A sterile gown and sterile gloves were used for the  procedure. Local anesthesia was provided with 1% Lidocaine.  Under CT fluoroscopic guidance, an 18 gauge trocar needlewas  advanced into the collection from a right trans gluteal approach.  Purulent material could be aspirated. An Amplatz guidewire advanced  easily within the collection, its position confirmed on CT  fluoroscopy. Tract was dilated to facilitate placement of a 12  French pigtail catheter, formed centrally within the collection.  Approximately 25 mL of purulent material were aspirated, a sample  sent for Gram stain, culture and sensitivity. The catheter was  secured externally with 0 Prolene suture and StatLock, flushed and  placed to a gravity bag. The patient tolerated the procedure well.    COMPLICATIONS:  None immediate    FINDINGS:  Prostatic abscess was evident on the scout images. A Foley catheter  marked the course of the prostatic urethra, which could be avoided  using a transgluteal approach. The drain catheter was  placed under  CT guidance as above. Sample sent for culture.  IMPRESSION:  1. Technically successful CT-guided prostate abscess drain catheter  placement.      Electronically Signed    By: Arne Cleveland M.D.    On: 07/19/2014 09:23       Verified By: Kandis Cocking, M.D.,   Assessment/Plan:  Invasive Device Daily Assessment of Necessity:  Does the patient currently have any of the following indwelling devices? foley   Indwelling Urinary Catheter continued, requirement due to   Reason to continue Indwelling Urinary Catheter preop or postop order according to surgical protocols  urinary retention   Assessment/Plan:  Assessment 34 yo M with large prostatic abscess s/p PPD #1 IR drainage of frank purulent material.  Wound culture growing Staph Aureus, sensitivites pending.  Vancomycin added to abx regimen.   Plan -leave Foley in place until tomorrow given history of urinary retention, prostatic abscess  -voiding trial tomorrow -drain with continued purulent output today, would leave in place until minimal  -continue to flush drain per IR recs -adjust abx based on culture and sensitivity data   Electronic Signatures: Sherlynn Stalls (MD)  (Signed 09-Oct-15 13:22)  Authored: Chief Complaint, VITAL SIGNS/ANCILLARY NOTES, Brief Assessment, Lab Results, Radiology Results, Assessment/Plan   Last Updated: 09-Oct-15 13:22 by Sherlynn Stalls (MD)

## 2015-02-01 NOTE — H&P (Signed)
PATIENT NAME:  [name], [name] DATE OF BIRTH:  1981/05/11  DATE OF ADMISSION:  07/18/2014  PRIMARY CARE PHYSICIAN: non local CHIEF COMPLAINT: Pain around the scrotal area along with dragging sensation.    HISTORY OF PRESENT ILLNESS: [name] is a pleasant 34 year old Caucasian gentleman with multiple medical problems including diabetes, hypertension, and history of coronary artery disease status post CABG, who is admitted directly from urology office for above chief complaint. The patient has been having on and off symptoms of UTI along with dragging/heaviness feeling around his prostate area. He was seen in the Emergency Room 2 days in a row, was started on Cipro, Levaquin and Bactrim, which was given through his primary care physician for  symptoms of possible UTI/prostatitis. Work-up in the Emergency Room on October 7 showed the patient does have a collection of fluid in his prostate was seen as an outpatient by Dr. Apolinar JunesBrandon of urology, who recommended the patient has developed possibly a prostate abscess.   PAST MEDICAL HISTORY:  1. Hypertension.  2. Type 2 diabetes.  3. TIA.  4. History of MI at age 34.  5. Coronary artery disease status post CABG at age 34.  46. Hyperlipidemia.  7. Hypertension.   ALLERGIES: NITROGLYCERIN.   MEDICATIONS:  1. Linagliptin/metformin 2.5/500 p.o. daily.  2. Docusate/Senna 2 tablets daily at bedtime.  3. Levaquin 500 daily.  4. Cipro 500 b.i.d.  5. Aspirin 81 mg 2 tablets daily.  6. Acetaminophen/oxycodone 5/325, 1 to 2 every 6 hours as needed.   FAMILY HISTORY: Positive for CAD/MI in the patient's grandfather.   SOCIAL HISTORY: Nonsmoker, nonalcoholic, sexually active with 1 partner. No history of blood transfusion, history of oral drug use in the remote past. No history of IV drug use. Denies any alcohol abuse.   REVIEW OF SYSTEMS:  CONSTITUTIONAL: Positive for fatigue, weakness.  EYES: No blurred or double vision, glaucoma.  EARS,  NOSE AND THROAT:  No tinnitus, ear pain, hearing loss.  RESPIRATORY: No cough, wheeze, hemoptysis, or dyspnea.  CARDIOVASCULAR: No chest pain, orthopnea, edema, or hypertension.  GASTROINTESTINAL: No nausea, vomiting, diarrhea, abdominal pain.  GENITOURINARY: No dysuria, hematuria, or frequency.  ENDOCRINE: No polyuria, nocturia or thyroid problems.  HEMATOLOGY: No anemia or easy bruising or bleeding.  SKIN: No acne, rash.  GENITOURINARY: Positive for dysuria. Positive for pain around the scrotal area.  MUSCULOSKELETAL: No arthritis or cramps or swelling.  NEUROLOGIC: No CVA, TIA or dementia.  PSYCHIATRIC: No anxiety or depression.  All other systems reviewed and negative.   PHYSICAL EXAMINATION:  GENERAL: The patient is awake, alert, oriented x 3, not in acute distress.  VITAL SIGNS: Afebrile, pulse is 89, blood pressure is 134/83, sats 100% on room air.  HEENT: Atraumatic, normocephalic. Pupils are equal, round and reactive to light and accommodation. EOM intact. Oral mucosa is moist.  NECK: Supple. No JVD. No carotid bruit.  LUNGS: Clear to auscultation bilaterally. No rales, rhonchi, respiratory distress or labored breathing.  HEART: Both the heart sounds are normal. Rate, rhythm regular. PMI not lateralized.  CHEST: Is nontender.  EXTREMITIES: Good pedal pulses, good femoral pulses. No lower extremity edema.  GENITOURINARY: The patient does have a Foley catheter that was placed about 2 days ago in the Emergency Room for his difficulty emptying the bladder.  NEUROLOGIC: Grossly normal.  Cranial nerves II-XII normal. No motor or sensory deficit.  PSYCHIATRIC: The patient is awake, alert, oriented x 3.  SKIN: Warm and dry.  LABS AND IMAGING:  hemoglobin and hematocrit 14.6 and 45.4. Glucose is 198, BUN is 0.72, sodium is 132, total protein is 8.4, albumin is 3.3. CT of the pelvis with contrast showed consistent with phlegmon/abscess of within the prostate. Is about 5.6 x 3.3 x 3.2. Fluid  collection more superiorly is primarily within the rightward aspect of the prostate. No air is seen in the lesion. There is subtle enhancement of the wall of this lesion. No mass or abscess is seen outside the prostate. No adenopathy. Ultrasound of the testicle appears normal scrotal ultrasound. No evidence of testicular torsion.   ASSESSMENT: A 34 year old, [name], with history of diabetes, coronary artery disease, status post CABG, hypertension, comes in with:   1. Acute prostate abscess versus phlegmon. The patient has been having elevated white count and pyuria along with penile discharge which is consistent more favoring with acute prostate abscess. The patient will be admitted, started on IV Levaquin and Rocephin. I spoke with Dr. Deanne Coffer, interventional radiology, who will perform drainage of the prostate fluid and send it down for culture and sensitivity. Will follow up blood cultures and urine cultures.  Dr. Apolinar Junes from urology to see the patient.  2. Type 2 diabetes. We will hold keep the patient on sliding scale insulin. Once his procedure is over we will start him back on his home medications.  3. History of CAD status post CABG. Will hold off on aspirin since he is undergoing procedure. He is not on any other cardiac medications at this time.  4. Deep vein thrombosis prophylaxis. Subcutaneous heparin will be resumed once the procedure is completed.  The above was discussed with the patient and the patient's wife who was present in the room.    CODE STATUS: The patient is a full code.   TIME SPENT: 50 minutes.     ____________________________ Wylie Hail Allena Katz, MD sap:JT D: 07/18/2014 14:03:31 ET T: 07/18/2014 15:00:40 ET JOB#: [id number]  cc: [name] A. Allena Katz, MD, <Dictator> Willow Ora MD ELECTRONICALLY SIGNED 07/29/2014 15:51

## 2015-03-18 ENCOUNTER — Other Ambulatory Visit: Payer: Self-pay

## 2015-03-18 ENCOUNTER — Emergency Department: Payer: Commercial Managed Care - HMO

## 2015-03-18 ENCOUNTER — Emergency Department
Admission: EM | Admit: 2015-03-18 | Discharge: 2015-03-18 | Disposition: A | Discharge status: Home / Self Care | Payer: Commercial Managed Care - HMO | Attending: Emergency Medicine | Admitting: Emergency Medicine

## 2015-03-18 DIAGNOSIS — X58XXXA Exposure to other specified factors, initial encounter: Secondary | ICD-10-CM | POA: Diagnosis not present

## 2015-03-18 DIAGNOSIS — Y9289 Other specified places as the place of occurrence of the external cause: Secondary | ICD-10-CM | POA: Diagnosis not present

## 2015-03-18 DIAGNOSIS — Z794 Long term (current) use of insulin: Secondary | ICD-10-CM | POA: Diagnosis not present

## 2015-03-18 DIAGNOSIS — T7840XA Allergy, unspecified, initial encounter: Secondary | ICD-10-CM | POA: Diagnosis present

## 2015-03-18 DIAGNOSIS — Y9389 Activity, other specified: Secondary | ICD-10-CM | POA: Diagnosis not present

## 2015-03-18 DIAGNOSIS — I10 Essential (primary) hypertension: Secondary | ICD-10-CM | POA: Insufficient documentation

## 2015-03-18 DIAGNOSIS — Z87891 Personal history of nicotine dependence: Secondary | ICD-10-CM | POA: Diagnosis not present

## 2015-03-18 DIAGNOSIS — Y998 Other external cause status: Secondary | ICD-10-CM | POA: Diagnosis not present

## 2015-03-18 DIAGNOSIS — Z79899 Other long term (current) drug therapy: Secondary | ICD-10-CM | POA: Diagnosis not present

## 2015-03-18 DIAGNOSIS — R079 Chest pain, unspecified: Secondary | ICD-10-CM | POA: Diagnosis not present

## 2015-03-18 DIAGNOSIS — E1165 Type 2 diabetes mellitus with hyperglycemia: Secondary | ICD-10-CM | POA: Insufficient documentation

## 2015-03-18 DIAGNOSIS — L500 Allergic urticaria: Secondary | ICD-10-CM | POA: Insufficient documentation

## 2015-03-18 DIAGNOSIS — L509 Urticaria, unspecified: Secondary | ICD-10-CM

## 2015-03-18 DIAGNOSIS — R739 Hyperglycemia, unspecified: Secondary | ICD-10-CM

## 2015-03-18 LAB — CBC WITH DIFFERENTIAL/PLATELET
BASOS ABS: 0 10*3/uL (ref 0–0.1)
Basophils Relative: 0 %
EOS ABS: 0 10*3/uL (ref 0–0.7)
Eosinophils Relative: 0 %
HEMATOCRIT: 43 % (ref 40.0–52.0)
Hemoglobin: 14.8 g/dL (ref 13.0–18.0)
Lymphocytes Relative: 23 %
Lymphs Abs: 2.1 10*3/uL (ref 1.0–3.6)
MCH: 29.5 pg (ref 26.0–34.0)
MCHC: 34.3 g/dL (ref 32.0–36.0)
MCV: 85.9 fL (ref 80.0–100.0)
MONO ABS: 0.6 10*3/uL (ref 0.2–1.0)
Monocytes Relative: 7 %
NEUTROS PCT: 70 %
Neutro Abs: 6.1 10*3/uL (ref 1.4–6.5)
PLATELETS: 237 10*3/uL (ref 150–440)
RBC: 5.01 MIL/uL (ref 4.40–5.90)
RDW: 12.5 % (ref 11.5–14.5)
WBC: 8.9 10*3/uL (ref 3.8–10.6)

## 2015-03-18 LAB — COMPREHENSIVE METABOLIC PANEL
ALT: 24 U/L (ref 17–63)
AST: 26 U/L (ref 15–41)
Albumin: 3.6 g/dL (ref 3.5–5.0)
Alkaline Phosphatase: 42 U/L (ref 38–126)
Anion gap: 12 (ref 5–15)
BILIRUBIN TOTAL: 0.6 mg/dL (ref 0.3–1.2)
BUN: 18 mg/dL (ref 6–20)
CALCIUM: 8.5 mg/dL — AB (ref 8.9–10.3)
CHLORIDE: 102 mmol/L (ref 101–111)
CO2: 23 mmol/L (ref 22–32)
CREATININE: 0.94 mg/dL (ref 0.61–1.24)
GFR calc Af Amer: >60 mL/min (ref >60)
GFR calc non Af Amer: >60 mL/min (ref >60)
Glucose, Bld: 331 mg/dL — ABNORMAL HIGH (ref 65–99)
Potassium: 3.6 mmol/L (ref 3.5–5.1)
Sodium: 137 mmol/L (ref 135–145)
TOTAL PROTEIN: 6.5 g/dL (ref 6.5–8.1)

## 2015-03-18 LAB — TROPONIN I

## 2015-03-18 LAB — GLUCOSE, CAPILLARY: GLUCOSE-CAPILLARY: 296 mg/dL — AB (ref 65–99)

## 2015-03-18 MED ORDER — HYDROXYZINE PAMOATE 25 MG PO CAPS: 25.0000 mg | ORAL_CAPSULE | Freq: Three times a day (TID) | ORAL | Status: DC | PRN

## 2015-03-18 MED ORDER — DEXAMETHASONE SODIUM PHOSPHATE 10 MG/ML IJ SOLN
10.0000 mg | Freq: Once | INTRAMUSCULAR | Status: AC
  Administered 2015-03-18: 10 mg via INTRAVENOUS

## 2015-03-18 MED ORDER — DEXAMETHASONE SODIUM PHOSPHATE 10 MG/ML IJ SOLN
INTRAMUSCULAR | Status: AC
  Administered 2015-03-18: 10 mg via INTRAVENOUS
  Filled 2015-03-18: qty 1

## 2015-03-18 MED ORDER — DIPHENHYDRAMINE HCL 50 MG/ML IJ SOLN
INTRAMUSCULAR | Status: AC
  Filled 2015-03-18: qty 1

## 2015-03-18 MED ORDER — DIPHENHYDRAMINE HCL 50 MG/ML IJ SOLN
25.0000 mg | Freq: Once | INTRAMUSCULAR | Status: AC
  Administered 2015-03-18: 25 mg via INTRAVENOUS

## 2015-03-18 MED ORDER — LORATADINE 10 MG PO TABS: 10.0000 mg | ORAL_TABLET | Freq: Every day | ORAL | Status: DC

## 2015-03-18 NOTE — ED Notes (Addendum)
Pt to triage via w/c with no distress noted; pt reports while at ballgame P[place], began breaking out in hives/itching with no known cause

## 2015-03-18 NOTE — ED Notes (Signed)
At this time, pt is resting quietly on stretcher,  He is in NAD, resp even and unlabored.

## 2015-03-18 NOTE — Discharge Instructions (Signed)

## 2015-03-18 NOTE — ED Notes (Signed)
Prior to benadryl and decadron pt states that he was feeling 8/10 chest pressure.

## 2015-03-18 NOTE — ED Notes (Signed)
Pt back from x-ray.significant improvement in skin- pt states that he is not itching as much and the hives are improving.   Reports chest pressure is 5/10, still c/o some SOB, but resps are even and unlabored.   Family at bedside, call bell within reach.

## 2015-03-18 NOTE — ED Provider Notes (Signed)
CSN: [id number]     Arrival date & time 03/18/15  2013 History   First MD Initiated Contact with Patient 03/18/15 2049     Chief Complaint  Patient presents with  . Allergic Reaction     (Consider location/radiation/quality/duration/timing/severity/associated sxs/prior Treatment) HPI Patient arrives today states he has had a baseball game earlier states that he developed hives became dizzy short of breath now he states that he has pressure on his chest to get a history of a CABG he thinks that it may be due to whatever's causing the reaction he has no radiation of the pain any nausea any disorientation but he just "" does not feel right" no other complaints at this time denies any pain is unsure of what could cause a reaction states no new foods or environmental exposures or medications or drinks does not believe he was bitten by anything Past Medical History  Diagnosis Date  . Coronary atherosclerosis of artery bypass graft     Age 34 - [place]  . Chest pain, unspecified   . Uncontrolled diabetes mellitus with complications 2002    poorly controlled  . HLD (hyperlipidemia)     poorly controlled  . HTN (hypertension)   . Obesity, unspecified   . Hx-TIA (transient ischemic attack) 2009    possible-([facility]) Normal MRI of brain and MRA of the head and neck  . Noncompliance    Past Surgical History  Procedure Laterality Date  . Coronary artery bypass graft  2007    x 4 (age 34)   Family History  Problem Relation Age of Onset  . Emphysema Father     + smoker  . COPD Father     Died age 61  . Coronary artery disease Maternal Grandfather   . Heart attack Maternal Grandfather   . Diabetes Maternal Grandfather   . Hypertension Maternal Grandfather   . Hyperlipidemia Maternal Grandfather   . Heart disease Maternal Grandfather     CAD  . Lung cancer      paternal uncles and aunts (all smokers)  . Coronary artery disease      premature-family history   History  Substance Use  Topics  . Smoking status: Former Smoker    Quit date: 10/11/2004  . Smokeless tobacco: Former Neurosurgeon     Comment: used to smoke 1 1/2 ppd  . Alcohol Use: No     Comment: 1-2 drinks per month    Review of Systems  Constitutional: Negative.   HENT: Negative.   Eyes: Negative.   Respiratory: Negative.   Cardiovascular: Positive for chest pain.  Gastrointestinal: Negative.   Musculoskeletal: Negative.   Skin: Positive for rash.  Neurological: Negative.   All other systems reviewed and are negative.     Allergies  Nitroglycerin  Home Medications   Prior to Admission medications   Medication Sig Start Date End Date Taking? Authorizing Provider  Alcohol Swabs (B-D SINGLE USE SWABS REGULAR) PADS Use as directed 01/09/15   Carollee Leitz, NP  Blood Glucose Calibration (TRUE METRIX LEVEL 1) LOW SOLN Use as directed 01/09/15   Carollee Leitz, NP  Blood Glucose Monitoring Suppl (TRUE METRIX AIR GLUCOSE METER) DEVI 1 Device by Does not apply route daily. Use as directed 01/09/15   Carollee Leitz, NP  escitalopram (LEXAPRO) 10 MG tablet Take 1 tablet (10 mg total) by mouth daily. 01/09/15   Carollee Leitz, NP  glucose blood (TRUE METRIX BLOOD GLUCOSE TEST) test strip Use as instructed  01/09/15   Carollee Leitzarrie M Doss, NP  hydrOXYzine (VISTARIL) 25 MG capsule Take 1 capsule (25 mg total) by mouth 3 (three) times daily as needed. 03/18/15   Kyla Duffy William C Laurren Lepkowski, PA-C  insulin aspart (NOVOLOG FLEXPEN) 100 UNIT/ML FlexPen Use on a sliding scale between 5-15 units with each meal. Max daily dose 45 units 12/25/14   Radhika P Phadke, MD  Insulin Glargine (LANTUS SOLOSTAR) 100 UNIT/ML Solostar Pen Inject 15 Units into the skin daily at 10 pm. 12/25/14   Radhika P Phadke, MD  Insulin Pen Needle (BD PEN NEEDLE NANO U/F) 32G X 4 MM MISC For insulin injections three times daily 12/25/14   Radhika P Phadke, MD  loratadine (CLARITIN) 10 MG tablet Take 1 tablet (10 mg total) by mouth daily. 03/18/15 03/17/16  Shacoya Burkhammer William C Niza Soderholm,  PA-C  pravastatin (PRAVACHOL) 40 MG tablet Take 1 tablet (40 mg total) by mouth daily. 01/09/15   Carollee Leitzarrie M Doss, NP  tiZANidine (ZANAFLEX) 4 MG tablet Take 1 tablet (4 mg total) by mouth at bedtime. 01/09/15   Carollee Leitzarrie M Doss, NP  TRUEPLUS LANCETS 28G MISC Use as directed 01/09/15   Carollee Leitzarrie M Doss, NP   BP 107/65 mmHg  Pulse 110  Resp 16  Ht 6' (1.829 m)  Wt 230 lb (104.327 kg)  BMI 31.19 kg/m2  SpO2 95% Physical Exam Male appearing stated age well-developed well-nourished no acute distress Vitals reviewed Head ears eyes nose neck and throat exams unremarkable airway is open and intact Cardiovascular regular rate and rhythm no murmurs rubs gallops Pulmonary lungs clear to auscultation bilaterally Abdomen soft flat nontender bowel sounds positive in all 4 quadrants Skin patient has a generalized urticarial rash mostlyon his feet arms and chest Musculoskeletal moving all extremities without difficulty Neuro exams nonfocal cranial nerves II through XII grossly intact ED Course  Procedures (including critical care time) Labs Review Labs Reviewed  COMPREHENSIVE METABOLIC PANEL - Abnormal; Notable for the following:    Glucose, Bld 331 (*)    Calcium 8.5 (*)    All other components within normal limits  GLUCOSE, CAPILLARY - Abnormal; Notable for the following:    Glucose-Capillary 296 (*)    All other components within normal limits  CBC WITH DIFFERENTIAL/PLATELET  TROPONIN I    Imaging Review Dg Chest 2 View  03/18/2015   CLINICAL DATA:  Onset tonight of hives, labored breathing, and fatigue. Prior cardiac bypass.  EXAM: CHEST  2 VIEW  COMPARISON:  12/30/2014.  FINDINGS: Normal cardiomediastinal silhouette. Clear lung fields. No effusion or pneumothorax. Prior CABG. Negative osseous structures.  IMPRESSION: No active disease.  Similar appearance to priors.   Electronically Signed   By: Davonna BellingJohn  Curnes M.D.   On: 03/18/2015 22:11     EKG Interpretation None      MDM  Decision making  on this patient given his otherwise negative workup and after sterile and IV Benadryl the clearing of his rash will discharge him home with prescriptions for some and histamines noting that his blood glucose was elevated in the department today 331 and that he has a history of this recommended he follow up with his primary care provider tomorrow to work on a plan for better control return here for any acute concerns or worsening symptoms  Final diagnoses:  Hyperglycemia  Allergic reaction, initial encounter  Urticaria        Mccrae Speciale Rosalyn GessWilliam C Sharde Gover, PA-C 03/18/15 2332  Jene Everyobert Kinner, MD 03/18/15 [phone]

## 2015-03-24 ENCOUNTER — Encounter: Payer: Self-pay | Admitting: Nurse Practitioner

## 2015-03-24 ENCOUNTER — Ambulatory Visit (INDEPENDENT_AMBULATORY_CARE_PROVIDER_SITE_OTHER): Payer: Commercial Managed Care - HMO | Admitting: Nurse Practitioner

## 2015-03-24 VITALS — BP 110/78 | HR 86 | Temp 98.0°F | Resp 14 | Ht 72.0 in | Wt 236.1 lb

## 2015-03-24 DIAGNOSIS — Z598 Other problems related to housing and economic circumstances: Secondary | ICD-10-CM

## 2015-03-24 DIAGNOSIS — M545 Low back pain: Secondary | ICD-10-CM

## 2015-03-24 DIAGNOSIS — Z91199 Patient's noncompliance with other medical treatment and regimen due to unspecified reason: Secondary | ICD-10-CM

## 2015-03-24 DIAGNOSIS — Z9119 Patient's noncompliance with other medical treatment and regimen: Secondary | ICD-10-CM | POA: Diagnosis not present

## 2015-03-24 DIAGNOSIS — E1165 Type 2 diabetes mellitus with hyperglycemia: Secondary | ICD-10-CM

## 2015-03-24 DIAGNOSIS — N410 Acute prostatitis: Secondary | ICD-10-CM

## 2015-03-24 DIAGNOSIS — F4322 Adjustment disorder with anxiety: Secondary | ICD-10-CM

## 2015-03-24 DIAGNOSIS — Z599 Problem related to housing and economic circumstances, unspecified: Secondary | ICD-10-CM

## 2015-03-24 DIAGNOSIS — E118 Type 2 diabetes mellitus with unspecified complications: Secondary | ICD-10-CM

## 2015-03-24 DIAGNOSIS — R109 Unspecified abdominal pain: Secondary | ICD-10-CM

## 2015-03-24 DIAGNOSIS — IMO0002 Reserved for concepts with insufficient information to code with codable children: Secondary | ICD-10-CM

## 2015-03-24 LAB — POCT URINALYSIS DIPSTICK
BILIRUBIN UA: NEGATIVE
Blood, UA: NEGATIVE
Glucose, UA: 500
KETONES UA: NEGATIVE
Leukocytes, UA: NEGATIVE
Nitrite, UA: NEGATIVE
Protein, UA: NEGATIVE
SPEC GRAV UA: 1.015
Urobilinogen, UA: 0.2
pH, UA: 5.5

## 2015-03-24 MED ORDER — TIZANIDINE HCL 4 MG PO TABS: 4.0000 mg | ORAL_TABLET | Freq: Every day | ORAL | Status: DC

## 2015-03-24 NOTE — Progress Notes (Signed)
Subjective:    Patient ID: [name], male    DOB: 05/22/1981, 34 y.o.   MRN: [id number]  HPI  [name] is a 34 yo male here for ER follow up and back pain x 3 weeks.  1)  ER on 03/18/15 with CC of dizziness and SOB. IV benadryl was given, hyperglycemia 331 blood sugar. Denies problems with allergic reaction from the ER. Did not mention back pain in ER.  Sees Dr. Welford Roche for diabetes  2) Hyperglycemia- Per Dr. Ephriam Jenkins note: March Visit's recommendations- Lantus 15 units daily at bedtime and Novolog 5+1 check sugars 3 x daily How he is currently taking:  Lantus 40 units in the morning and at night, he reports it is helpful 100's to 200's. Couldn't get Novolog- due to cost; able to test 2-3 x a day   2) Back pain x 3 weeks; POCT urine requested due to prostate problems in the past. More on left side > right side, Flank area, not drinking much fluids, dysuria then switched to water and cranberry juice- no dysuria currently, pain with picking up objects. New mattress x 1 year, can't sit for long or walk for long without hurting. 2006 herniated 2 discs he reports, Constant- sharp pain without relief, at night. When lying on stomach and chest it helps.  Ibuprofen- Not helpful Aleve- Not helpful Wife massaged back- somewhat helpful   3) Started discussing- dizziness with looking up and down, shoulder improved, but left arm has numbness, he reports multiple aches and pains. His anxiety has increased since his evaluation is due for his disability status next week.   Review of Systems  Constitutional: Negative for fever, chills, diaphoresis, activity change and fatigue.  Eyes: Negative for visual disturbance.  Gastrointestinal: Negative for nausea, vomiting, diarrhea and rectal pain.  Genitourinary: Positive for flank pain. Negative for dysuria, urgency, frequency, hematuria and difficulty urinating.  Musculoskeletal: Positive for back pain and arthralgias. Negative for myalgias, joint  swelling, gait problem, neck pain and neck stiffness.  Skin: Negative for rash.  Neurological: Negative for dizziness, weakness and numbness.  Psychiatric/Behavioral: Negative for suicidal ideas and self-injury. The patient is not nervous/anxious.    Past Medical History  Diagnosis Date  . Coronary atherosclerosis of artery bypass graft     Age 34 - [place]  . Chest pain, unspecified   . Uncontrolled diabetes mellitus with complications 2002    poorly controlled  . HLD (hyperlipidemia)     poorly controlled  . HTN (hypertension)   . Obesity, unspecified   . Hx-TIA (transient ischemic attack) 2009    possible-([facility]) Normal MRI of brain and MRA of the head and neck  . Noncompliance     History   Social History  . Marital Status: Married    Spouse Name: N/A  . Number of Children: 1  . Years of Education: N/A   Occupational History  . disability for heart condition    Social History Main Topics  . Smoking status: Former Smoker    Quit date: 10/11/2004  . Smokeless tobacco: Former Neurosurgeon     Comment: used to smoke 1 1/2 ppd  . Alcohol Use: No     Comment: 1-2 drinks per month  . Drug Use: No  . Sexual Activity: Not on file   Other Topics Concern  . Not on file   Social History Narrative      Caffeine: 3 cups coffee ~ 4 times per week, 2L diet soda per day  Lives with wife and son (89 y/o)      Lives in [place]      On disability      Exercises - walk, push ups, sit-ups. Coaches baseball. Hunting.          Past Surgical History  Procedure Laterality Date  . Coronary artery bypass graft  2007    x 4 (age 38)    Family History  Problem Relation Age of Onset  . Emphysema Father     + smoker  . COPD Father     Died age 50  . Coronary artery disease Maternal Grandfather   . Heart attack Maternal Grandfather   . Diabetes Maternal Grandfather   . Hypertension Maternal Grandfather   . Hyperlipidemia Maternal Grandfather   . Heart disease Maternal  Grandfather     CAD  . Lung cancer      paternal uncles and aunts (all smokers)  . Coronary artery disease      premature-family history    Allergies  Allergen Reactions  . Nitroglycerin Nausea And Vomiting    Current Outpatient Prescriptions on File Prior to Visit  Medication Sig Dispense Refill  . Alcohol Swabs (B-D SINGLE USE SWABS REGULAR) PADS Use as directed 300 each 1  . Blood Glucose Calibration (TRUE METRIX LEVEL 1) LOW SOLN Use as directed 1 each 1  . Blood Glucose Monitoring Suppl (TRUE METRIX AIR GLUCOSE METER) DEVI 1 Device by Does not apply route daily. Use as directed 1 Device 0  . escitalopram (LEXAPRO) 10 MG tablet Take 1 tablet (10 mg total) by mouth daily. 90 tablet 0  . glucose blood (TRUE METRIX BLOOD GLUCOSE TEST) test strip Use as instructed 300 each 1  . hydrOXYzine (VISTARIL) 25 MG capsule Take 1 capsule (25 mg total) by mouth 3 (three) times daily as needed. 15 capsule 0  . Insulin Glargine (LANTUS SOLOSTAR) 100 UNIT/ML Solostar Pen Inject 15 Units into the skin daily at 10 pm. 5 pen 3  . Insulin Pen Needle (BD PEN NEEDLE NANO U/F) 32G X 4 MM MISC For insulin injections three times daily 100 each 11  . TRUEPLUS LANCETS 28G MISC Use as directed 300 each 1  . insulin aspart (NOVOLOG FLEXPEN) 100 UNIT/ML FlexPen Use on a sliding scale between 5-15 units with each meal. Max daily dose 45 units (Patient not taking: Reported on 03/24/2015) 15 mL 3   No current facility-administered medications on file prior to visit.      Objective:   Physical Exam  Constitutional: He is oriented to person, place, and time. He appears well-developed and well-nourished. No distress.  BP 110/78 mmHg  Pulse 86  Temp(Src) 98 F (36.7 C)  Resp 14  Wt 236 lb 1.9 oz (107.103 kg)  SpO2 97%   HENT:  Head: Normocephalic and atraumatic.  Right Ear: External ear normal.  Left Ear: External ear normal.  Eyes: EOM are normal. Pupils are equal, round, and reactive to light. Right eye  exhibits no discharge. Left eye exhibits no discharge. No scleral icterus.  Cardiovascular: Normal rate and regular rhythm.   Pulmonary/Chest: Effort normal and breath sounds normal. No respiratory distress. He has no wheezes. He has no rales. He exhibits no tenderness.  Musculoskeletal: He exhibits tenderness. He exhibits no edema.  Decreased ROM with all extremities/joints due to pain (pt reports), Tenderness of left flank at first palpation then none at second- similar amounts of pressure  Neurological: He is alert and oriented to person, place,  and time.  Skin: Skin is warm and dry. No rash noted. He is not diaphoretic.  Psychiatric: He has a normal mood and affect. His behavior is normal. Judgment and thought content normal.      Assessment & Plan:

## 2015-03-24 NOTE — Assessment & Plan Note (Signed)
Patient seems to be non-compliant with follow up with providers and this may in part be due to financial concerns. Pt has disability evaluation coming up soon. I asked him to follow up with Dr. Welford Roche due to financial concerns with his Novolog.

## 2015-03-24 NOTE — Progress Notes (Signed)
Pre visit review using our clinic review tool, if applicable. No additional management support is needed unless otherwise documented below in the visit note. 

## 2015-03-24 NOTE — Assessment & Plan Note (Signed)
Patient has left flank pain- doubt kidney etiology due to patient's history of constant stabbing pain without fever/nausea x 3 weeks. He has multiple MSK complaints and a negative for infection POCT urine (will obtain culture). Right flank is less painful per pt. Upped flexeril to twice daily or 2 tablets at night since he reports they were not working at the previous dosage. Will FU in 4 weeks.

## 2015-03-24 NOTE — Assessment & Plan Note (Signed)
Patient is unable to establish with medication help resources and clinic help resources due to his insurance. He reports Novolog would be 600 dollars and is sticking with Lantus and titrated himself. He gets other diabetic supplies without cost.

## 2015-03-24 NOTE — Assessment & Plan Note (Signed)
POCT urine because pt had this in past. Only thing found was glucose (500) in urine. Will culture. Denies any prostate issues today.

## 2015-03-24 NOTE — Assessment & Plan Note (Signed)
Increasing anxiety- situational. Pt is currently on medication Lexapro 10 mg daily. FU in 1 month

## 2015-03-24 NOTE — Assessment & Plan Note (Signed)
Patient reports he is taking Lantus 40 units morning and night. He reports no lows and reports checking sugars 2-3 x daily. He had not checked by today's visit. He reports upper 100's-low 200's range. He did not follow up with Dr. Welford Roche and reports Novolog is too expensive and is 600 dollars to obtain. Asked him to make a follow up appointment with Dr. Welford Roche soon.

## 2015-03-24 NOTE — Patient Instructions (Addendum)
I sent in Zanaflex- can take 2 at night for muscle spasms (or 1 during the day and 1 at night). Cannot do stronger medication (tramadol) due to increase probability of seizure with your other medications.   Follow up in 1 month. Please follow up with Dr. Welford Roche for diabetes soon!

## 2015-04-24 ENCOUNTER — Encounter: Payer: Self-pay | Admitting: Urgent Care

## 2015-04-24 ENCOUNTER — Emergency Department
Admission: EM | Admit: 2015-04-24 | Discharge: 2015-04-24 | Disposition: A | Discharge status: Home / Self Care | Payer: Commercial Managed Care - HMO | Attending: Emergency Medicine | Admitting: Emergency Medicine

## 2015-04-24 DIAGNOSIS — L509 Urticaria, unspecified: Secondary | ICD-10-CM | POA: Diagnosis not present

## 2015-04-24 DIAGNOSIS — Z794 Long term (current) use of insulin: Secondary | ICD-10-CM | POA: Insufficient documentation

## 2015-04-24 DIAGNOSIS — E118 Type 2 diabetes mellitus with unspecified complications: Secondary | ICD-10-CM | POA: Diagnosis not present

## 2015-04-24 DIAGNOSIS — Z79899 Other long term (current) drug therapy: Secondary | ICD-10-CM | POA: Insufficient documentation

## 2015-04-24 DIAGNOSIS — I10 Essential (primary) hypertension: Secondary | ICD-10-CM | POA: Diagnosis not present

## 2015-04-24 DIAGNOSIS — R21 Rash and other nonspecific skin eruption: Secondary | ICD-10-CM | POA: Diagnosis present

## 2015-04-24 DIAGNOSIS — Z87891 Personal history of nicotine dependence: Secondary | ICD-10-CM | POA: Diagnosis not present

## 2015-04-24 MED ORDER — EPINEPHRINE 0.3 MG/0.3ML IJ SOAJ
0.3000 mg | Freq: Once | INTRAMUSCULAR | Status: DC
Start: 2015-04-24 — End: 2015-10-13

## 2015-04-24 MED ORDER — METHYLPREDNISOLONE SODIUM SUCC 125 MG IJ SOLR
INTRAMUSCULAR | Status: AC
  Administered 2015-04-24: 125 mg via INTRAVENOUS
  Filled 2015-04-24: qty 2

## 2015-04-24 MED ORDER — DIPHENHYDRAMINE HCL 50 MG/ML IJ SOLN
25.0000 mg | Freq: Once | INTRAMUSCULAR | Status: AC
  Administered 2015-04-24: 25 mg via INTRAVENOUS

## 2015-04-24 MED ORDER — METHYLPREDNISOLONE SODIUM SUCC 125 MG IJ SOLR
125.0000 mg | Freq: Once | INTRAMUSCULAR | Status: AC
  Administered 2015-04-24: 125 mg via INTRAVENOUS

## 2015-04-24 MED ORDER — FAMOTIDINE IN NACL 20-0.9 MG/50ML-% IV SOLN
INTRAVENOUS | Status: AC
  Administered 2015-04-24: 20 mg via INTRAVENOUS
  Filled 2015-04-24: qty 50

## 2015-04-24 MED ORDER — FAMOTIDINE IN NACL 20-0.9 MG/50ML-% IV SOLN
20.0000 mg | Freq: Once | INTRAVENOUS | Status: AC
  Administered 2015-04-24: 20 mg via INTRAVENOUS

## 2015-04-24 MED ORDER — DIPHENHYDRAMINE HCL 50 MG/ML IJ SOLN
INTRAMUSCULAR | Status: AC
  Administered 2015-04-24: 25 mg via INTRAVENOUS
  Filled 2015-04-24: qty 1

## 2015-04-24 NOTE — ED Notes (Signed)
Pt asleep in bed at this time, NAD noted. SO at bedside.

## 2015-04-24 NOTE — ED Notes (Signed)
NAD Noted at time of D/C. Pt taken to the lobby via wheelchair at this time. Wife at bedside to receive D/C instructions.

## 2015-04-24 NOTE — ED Provider Notes (Signed)
[facility] Emergency Department Provider Note  ____________________________________________  Time seen: 6:30 AM  I have reviewed the triage vital signs and the nursing notes.   HISTORY  Chief Complaint Rash     HPI [name] is a 34 y.o. male presents with hives to bilateral lower extremity and abdominal wall. Patient not aware of any known allergies. Of note patient states he was seen 2 months prior for the same. Patient denies any difficulty breathing no difficulty swallowing.     Past Medical History  Diagnosis Date  . Coronary atherosclerosis of artery bypass graft     Age 34 - [place]  . Chest pain, unspecified   . Uncontrolled diabetes mellitus with complications 2002    poorly controlled  . HLD (hyperlipidemia)     poorly controlled  . HTN (hypertension)   . Obesity, unspecified   . Hx-TIA (transient ischemic attack) 2009    possible-([facility]) Normal MRI of brain and MRA of the head and neck  . Noncompliance     Patient Active Problem List   Diagnosis Date Noted  . Non compliance with medical treatment 03/24/2015  . Left flank pain 03/24/2015  . Cervical radiculitis 09/04/2014  . Acute prostatitis 07/20/2014  . Diabetes mellitus type 2 with complications, uncontrolled 02/13/2014  . Macular edema 02/13/2014  . HLD (hyperlipidemia) 11/08/2013  . Left shoulder pain 11/08/2013  . Financial difficulties 11/08/2013  . Depression with anxiety 11/08/2013  . Adjustment disorder with anxiety 10/26/2012  . Headache(784.0) 05/04/2011  . OBESITY 12/22/2010  . CHEST PAIN UNSPECIFIED 03/05/2009  . HYPERLIPIDEMIA-MIXED 03/03/2009  . Essential hypertension 03/03/2009  . CAD, ARTERY BYPASS GRAFT 03/03/2009    Past Surgical History  Procedure Laterality Date  . Coronary artery bypass graft  2007    x 4 (age 34)    Current Outpatient Rx  Name  Route  Sig  Dispense  Refill  . Alcohol Swabs (B-D SINGLE USE SWABS REGULAR) PADS      Use  as directed   300 each   1   . Blood Glucose Calibration (TRUE METRIX LEVEL 1) LOW SOLN      Use as directed   1 each   1   . Blood Glucose Monitoring Suppl (TRUE METRIX AIR GLUCOSE METER) DEVI   Does not apply   1 Device by Does not apply route daily. Use as directed   1 Device   0   . escitalopram (LEXAPRO) 10 MG tablet   Oral   Take 1 tablet (10 mg total) by mouth daily.   90 tablet   0   . glucose blood (TRUE METRIX BLOOD GLUCOSE TEST) test strip      Use as instructed   300 each   1   . hydrOXYzine (VISTARIL) 25 MG capsule   Oral   Take 1 capsule (25 mg total) by mouth 3 (three) times daily as needed.   15 capsule   0   . insulin aspart (NOVOLOG FLEXPEN) 100 UNIT/ML FlexPen      Use on a sliding scale between 5-15 units with each meal. Max daily dose 45 units Patient not taking: Reported on 03/24/2015   15 mL   3   . Insulin Glargine (LANTUS SOLOSTAR) 100 UNIT/ML Solostar Pen   Subcutaneous   Inject 15 Units into the skin daily at 10 pm.   5 pen   3   . Insulin Pen Needle (BD PEN NEEDLE NANO U/F) 32G X 4  MM MISC      For insulin injections three times daily   100 each   11   . tiZANidine (ZANAFLEX) 4 MG tablet   Oral   Take 1 tablet (4 mg total) by mouth at bedtime.   60 tablet   0   . TRUEPLUS LANCETS 28G MISC      Use as directed   300 each   1     Allergies Nitroglycerin  Family History  Problem Relation Age of Onset  . Emphysema Father     + smoker  . COPD Father     Died age 24  . Coronary artery disease Maternal Grandfather   . Heart attack Maternal Grandfather   . Diabetes Maternal Grandfather   . Hypertension Maternal Grandfather   . Hyperlipidemia Maternal Grandfather   . Heart disease Maternal Grandfather     CAD  . Lung cancer      paternal uncles and aunts (all smokers)  . Coronary artery disease      premature-family history    Social History History  Substance Use Topics  . Smoking status: Former Smoker     Quit date: 10/11/2004  . Smokeless tobacco: Former Neurosurgeon     Comment: used to smoke 1 1/2 ppd  . Alcohol Use: No     Comment: 1-2 drinks per month    Review of Systems  Constitutional: Negative for fever. Eyes: Negative for visual changes. ENT: Negative for sore throat. Cardiovascular: Negative for chest pain. Respiratory: Negative for shortness of breath. Gastrointestinal: Negative for abdominal pain, vomiting and diarrhea. Genitourinary: Negative for dysuria. Musculoskeletal: Negative for back pain. Skin: Positive for rash. Neurological: Negative for headaches, focal weakness or numbness.   10-point ROS otherwise negative.  ____________________________________________   PHYSICAL EXAM:  VITAL SIGNS: ED Triage Vitals  Enc Vitals Group     BP 04/24/15 0626 130/85 mmHg     Pulse Rate 04/24/15 0626 106     Resp 04/24/15 0626 20     Temp 04/24/15 0626 98.6 F (37 C)     Temp Source 04/24/15 0626 Oral     SpO2 04/24/15 0626 95 %     Weight 04/24/15 0626 234 lb (106.142 kg)     Height 04/24/15 0626 6' (1.829 m)     Head Cir --      Peak Flow --      Pain Score 04/24/15 0627 0     Pain Loc --      Pain Edu? --      Excl. in GC? --      Constitutional: Alert and oriented. Well appearing and in no distress. Eyes: Conjunctivae are normal. PERRL. Normal extraocular movements. ENT   Head: Normocephalic and atraumatic.   Nose: No congestion/rhinnorhea.   Mouth/Throat: Mucous membranes are moist.   Neck: No stridor. Cardiovascular: Normal rate, regular rhythm. Normal and symmetric distal pulses are present in all extremities. No murmurs, rubs, or gallops. Respiratory: Normal respiratory effort without tachypnea nor retractions. Breath sounds are clear and equal bilaterally. No wheezes/rales/rhonchi. Gastrointestinal: Soft and nontender. No distention. There is no CVA tenderness. Genitourinary: deferred Musculoskeletal: Nontender with normal range of motion in  all extremities. No joint effusions.  No lower extremity tenderness nor edema. Neurologic:  Normal speech and language. No gross focal neurologic deficits are appreciated. Speech is normal.  Skin:  Hives noted on the bilateral lower extremity and abdominal wall Psychiatric: Mood and affect are normal. Speech and behavior are normal. Patient  exhibits appropriate insight and judgment.  ____________________________________________         INITIAL IMPRESSION / ASSESSMENT AND PLAN / ED COURSE  Pertinent labs & imaging results that were available during my care of the patient were reviewed by me and considered in my medical decision making (see chart for details).  History and physical exam consistent with allergic reaction of unknown etiology. Patient received sign Medrol 125 mg Benadryl 50 mg and Pepcid. I will refer the patient to Dr. Elenore Rota (allergist). In addition I'll prescribe the patient an EpiPen.  ____________________________________________   FINAL CLINICAL IMPRESSION(S) / ED DIAGNOSES  Final diagnoses:  Hives      Darci Current, MD 04/29/15 [phone]

## 2015-04-24 NOTE — ED Notes (Signed)
Patient presents with hives to body. Unknown etiology. Was here x 2 months ago for the same.

## 2015-04-24 NOTE — Discharge Instructions (Signed)
Hives Hives are itchy, red, swollen areas of the skin. They can vary in size and location on your body. Hives can come and go for hours or several days (acute hives) or for several weeks (chronic hives). Hives do not spread from person to person (noncontagious). They may get worse with scratching, exercise, and emotional stress. CAUSES   Allergic reaction to food, additives, or drugs.  Infections, including the common cold.  Illness, such as vasculitis, lupus, or thyroid disease.  Exposure to sunlight, heat, or cold.  Exercise.  Stress.  Contact with chemicals. SYMPTOMS   Red or white swollen patches on the skin. The patches may change size, shape, and location quickly and repeatedly.  Itching.  Swelling of the hands, feet, and face. This may occur if hives develop deeper in the skin. DIAGNOSIS  Your caregiver can usually tell what is wrong by performing a physical exam. Skin or blood tests may also be done to determine the cause of your hives. In some cases, the cause cannot be determined. TREATMENT  Mild cases usually get better with medicines such as antihistamines. Severe cases may require an emergency epinephrine injection. If the cause of your hives is known, treatment includes avoiding that trigger.  HOME CARE INSTRUCTIONS   Avoid causes that trigger your hives.  Take antihistamines as directed by your caregiver to reduce the severity of your hives. Non-sedating or low-sedating antihistamines are usually recommended. Do not drive while taking an antihistamine.  Take any other medicines prescribed for itching as directed by your caregiver.  Wear loose-fitting clothing.  Keep all follow-up appointments as directed by your caregiver. SEEK MEDICAL CARE IF:   You have persistent or severe itching that is not relieved with medicine.  You have painful or swollen joints. SEEK IMMEDIATE MEDICAL CARE IF:   You have a fever.  Your tongue or lips are swollen.  You have  trouble breathing or swallowing.  You feel tightness in the throat or chest.  You have abdominal pain. These problems may be the first sign of a life-threatening allergic reaction. Call your local emergency services (911 in [place] MAKE SURE YOU:   Understand these instructions.  Will watch your condition.  Will get help right away if you are not doing well or get worse. Document Released: 09/27/2005 Document Revised: 10/02/2013 Document Reviewed: 12/21/2011 [employer] Patient Information 2015 [employer]. This information is not intended to replace advice given to you by your health care provider. Make sure you discuss any questions you have with your health care provider.  

## 2015-05-07 ENCOUNTER — Ambulatory Visit (INDEPENDENT_AMBULATORY_CARE_PROVIDER_SITE_OTHER): Payer: Commercial Managed Care - HMO | Admitting: Cardiovascular Disease

## 2015-05-07 ENCOUNTER — Encounter: Payer: Self-pay | Admitting: Cardiovascular Disease

## 2015-05-07 VITALS — BP 106/72 | HR 80 | Ht 72.0 in | Wt 234.5 lb

## 2015-05-07 DIAGNOSIS — E118 Type 2 diabetes mellitus with unspecified complications: Secondary | ICD-10-CM

## 2015-05-07 DIAGNOSIS — I25810 Atherosclerosis of coronary artery bypass graft(s) without angina pectoris: Secondary | ICD-10-CM | POA: Diagnosis not present

## 2015-05-07 DIAGNOSIS — R079 Chest pain, unspecified: Secondary | ICD-10-CM | POA: Diagnosis not present

## 2015-05-07 DIAGNOSIS — I10 Essential (primary) hypertension: Secondary | ICD-10-CM | POA: Diagnosis not present

## 2015-05-07 DIAGNOSIS — E785 Hyperlipidemia, unspecified: Secondary | ICD-10-CM

## 2015-05-07 DIAGNOSIS — IMO0002 Reserved for concepts with insufficient information to code with codable children: Secondary | ICD-10-CM

## 2015-05-07 DIAGNOSIS — R0789 Other chest pain: Secondary | ICD-10-CM

## 2015-05-07 DIAGNOSIS — E1165 Type 2 diabetes mellitus with hyperglycemia: Secondary | ICD-10-CM

## 2015-05-07 MED ORDER — ATORVASTATIN CALCIUM 80 MG PO TABS: 80.0000 mg | ORAL_TABLET | Freq: Every day | ORAL | Status: DC

## 2015-05-07 NOTE — Assessment & Plan Note (Signed)
Recommended strict follow-up with his primary care and endocrine Unable to afford NovoLog

## 2015-05-07 NOTE — Progress Notes (Signed)
Patient ID: AYINDE SWIM, male    DOB: 07-15-1981, 34 y.o.   MRN: [id number]  HPI Comments: 34 yo with h/o premature CAD s/p CABG and PCI, smoking history, continued poorly controlled diabetes, medication noncompliance, previous evaluation in September 2012 for chest pain, stress test in 2011 showing no ischemia, last catheterization in 2010 showing patent grafts who presents for routine followup of his coronary artery disease.  Medication noncompliance continues to be a major issue Last stress test September 2014 showing no ischemia  In follow-up today, he is not taking any of his NovoLog. It is too expensive Scheduled to see new endocrinology August 15. Sugars continue to run high He reports having occasional fluttering in his chest lasting one hour typically once or twice per week in the evenings. Denies any chest pain with exertion concerning for angina. Is having neck pain at times, muscle spasm in his posterior shoulder areas Currently not taking metoprolol. Also not on a statin. Never had a problem on the statins per the patient  EKG on today's visit shows normal sinus rhythm with rate 80 bpm, no significant ST or T-wave changes  Other past medical history Previous Hemoglobin A1c typically more than 13   seen in the emergency room 11/21/2013 for chronic chest pain.  Cardiac enzymes negative x2. No further workup at that time Seen in clinic in followup and felt it was atypical in nature  Chest  pain in 10/11 and went to [facility] where he had a stress test showing basal to mid inferolateral scar and a small area of scar in the mid anteroseptum.  There was no ischemia.     Allergies  Allergen Reactions  . Nitroglycerin Nausea And Vomiting    Current Outpatient Prescriptions on File Prior to Visit  Medication Sig Dispense Refill  . Alcohol Swabs (B-D SINGLE USE SWABS REGULAR) PADS Use as directed 300 each 1  . Blood Glucose Calibration (TRUE METRIX LEVEL 1) LOW SOLN Use as directed  1 each 1  . Blood Glucose Monitoring Suppl (TRUE METRIX AIR GLUCOSE METER) DEVI 1 Device by Does not apply route daily. Use as directed 1 Device 0  . EPINEPHrine (EPIPEN 2-PAK) 0.3 mg/0.3 mL IJ SOAJ injection Inject 0.3 mLs (0.3 mg total) into the muscle once. 1 Device 0  . escitalopram (LEXAPRO) 10 MG tablet Take 1 tablet (10 mg total) by mouth daily. 90 tablet 0  . glucose blood (TRUE METRIX BLOOD GLUCOSE TEST) test strip Use as instructed 300 each 1  . hydrOXYzine (VISTARIL) 25 MG capsule Take 1 capsule (25 mg total) by mouth 3 (three) times daily as needed. 15 capsule 0  . insulin aspart (NOVOLOG FLEXPEN) 100 UNIT/ML FlexPen Use on a sliding scale between 5-15 units with each meal. Max daily dose 45 units 15 mL 3  . Insulin Glargine (LANTUS SOLOSTAR) 100 UNIT/ML Solostar Pen Inject 15 Units into the skin daily at 10 pm. 5 pen 3  . Insulin Pen Needle (BD PEN NEEDLE NANO U/F) 32G X 4 MM MISC For insulin injections three times daily 100 each 11  . tiZANidine (ZANAFLEX) 4 MG tablet Take 1 tablet (4 mg total) by mouth at bedtime. 60 tablet 0  . TRUEPLUS LANCETS 28G MISC Use as directed 300 each 1   No current facility-administered medications on file prior to visit.    Past Medical History  Diagnosis Date  . Coronary atherosclerosis of artery bypass graft     Age 78 - [place]  .  Chest pain, unspecified   . Uncontrolled diabetes mellitus with complications 2002    poorly controlled  . HLD (hyperlipidemia)     poorly controlled  . HTN (hypertension)   . Obesity, unspecified   . Hx-TIA (transient ischemic attack) 2009    possible-([facility]) Normal MRI of brain and MRA of the head and neck  . Noncompliance     Past Surgical History  Procedure Laterality Date  . Coronary artery bypass graft  2007    x 4 (age 34)    Social History  reports that he quit smoking about 10 years ago. He has quit using smokeless tobacco. He reports that he does not drink alcohol or use illicit  drugs.  Family History family history includes COPD in his father; Coronary artery disease in his maternal grandfather and another family member; Diabetes in his maternal grandfather; Emphysema in his father; Heart attack in his maternal grandfather; Heart disease in his maternal grandfather; Hyperlipidemia in his maternal grandfather; Hypertension in his maternal grandfather; Lung cancer in an other family member.   Review of Systems  Constitutional: Negative.   HENT: Negative.   Eyes: Negative.   Respiratory: Negative.   Cardiovascular: Negative.   Gastrointestinal: Negative.   Endocrine: Negative.   Musculoskeletal: Negative.   Skin: Negative.   Allergic/Immunologic: Negative.   Neurological: Negative.   Hematological: Negative.   Psychiatric/Behavioral: Negative.   All other systems reviewed and are negative.   BP 106/72 mmHg  Pulse 80  Ht 6' (1.829 m)  Wt 234 lb 8 oz (106.369 kg)  BMI 31.80 kg/m2  Physical Exam  Constitutional: He is oriented to person, place, and time. He appears well-developed and well-nourished.  HENT:  Head: Normocephalic.  Nose: Nose normal.  Mouth/Throat: Oropharynx is clear and moist.  Eyes: Conjunctivae are normal. Pupils are equal, round, and reactive to light.  Neck: Normal range of motion. Neck supple. No JVD present.  Cardiovascular: Normal rate, regular rhythm, S1 normal, S2 normal, normal heart sounds and intact distal pulses.  Exam reveals no gallop and no friction rub.   No murmur heard. Pulmonary/Chest: Effort normal and breath sounds normal. No respiratory distress. He has no wheezes. He has no rales. He exhibits no tenderness.  Abdominal: Soft. Bowel sounds are normal. He exhibits no distension. There is no tenderness.  Musculoskeletal: Normal range of motion. He exhibits no edema or tenderness.  Lymphadenopathy:    He has no cervical adenopathy.  Neurological: He is alert and oriented to person, place, and time. Coordination  normal.  Skin: Skin is warm and dry. No rash noted. No erythema.  Psychiatric: He has a normal mood and affect. His behavior is normal. Judgment and thought content normal.      Assessment and Plan   Nursing note and vitals reviewed.

## 2015-05-07 NOTE — Assessment & Plan Note (Signed)
Recommended if he has worsening shortness of breath or chest pain symptoms that he call our office for stress testing High risk of recurrent CAD

## 2015-05-07 NOTE — Assessment & Plan Note (Signed)
Blood pressure is low on today's visit. We will hold off on adding beta blocker

## 2015-05-07 NOTE — Assessment & Plan Note (Signed)
Atypical chest pain in the office today. Recommended he call us if symptoms get worse

## 2015-05-07 NOTE — Patient Instructions (Signed)
Please start atorvastatin 1/2 pill for a few weeks, Then increase up to a full pill.  Please call us if you have new issues that need to be addressed before your next appt.  Your physician wants you to follow-up in: 6 months.  You will receive a reminder letter in the mail two months in advance. If you don't receive a letter, please call our office to schedule the follow-up appointment.

## 2015-05-07 NOTE — Assessment & Plan Note (Signed)
We will start Lipitor 40 mg daily with titration up to 80 mg daily

## 2015-05-09 ENCOUNTER — Other Ambulatory Visit: Payer: Self-pay | Admitting: Nurse Practitioner

## 2015-05-09 MED ORDER — INSULIN ASPART 100 UNIT/ML ~~LOC~~ SOLN: SUBCUTANEOUS | Status: DC

## 2015-05-09 NOTE — Progress Notes (Signed)
Pt cannot afford Novalog Flexpen and he is ok with going back to the Novalog that has to be drawn up. He states that he has needles and lancets and would like to have his insulin sent to [facility] on [address].

## 2015-05-26 ENCOUNTER — Other Ambulatory Visit: Payer: Self-pay | Admitting: Pediatrics

## 2015-05-26 ENCOUNTER — Ambulatory Visit
Admission: RE | Admit: 2015-05-26 | Discharge: 2015-05-26 | Disposition: A | Discharge status: Home / Self Care | Payer: Disability Insurance | Source: Ambulatory Visit | Attending: Pediatrics | Admitting: Pediatrics

## 2015-05-26 ENCOUNTER — Ambulatory Visit: Payer: Commercial Managed Care - HMO | Admitting: Internal Medicine

## 2015-05-26 DIAGNOSIS — Z951 Presence of aortocoronary bypass graft: Secondary | ICD-10-CM | POA: Diagnosis not present

## 2015-05-26 DIAGNOSIS — I639 Cerebral infarction, unspecified: Secondary | ICD-10-CM

## 2015-05-26 DIAGNOSIS — G459 Transient cerebral ischemic attack, unspecified: Secondary | ICD-10-CM

## 2015-05-26 DIAGNOSIS — I214 Non-ST elevation (NSTEMI) myocardial infarction: Secondary | ICD-10-CM

## 2015-05-30 ENCOUNTER — Other Ambulatory Visit: Payer: Self-pay

## 2015-05-30 ENCOUNTER — Emergency Department: Payer: Commercial Managed Care - HMO

## 2015-05-30 ENCOUNTER — Emergency Department
Admission: EM | Admit: 2015-05-30 | Discharge: 2015-05-30 | Disposition: A | Discharge status: Home / Self Care | Payer: Commercial Managed Care - HMO | Attending: Emergency Medicine | Admitting: Emergency Medicine

## 2015-05-30 ENCOUNTER — Encounter: Payer: Self-pay | Admitting: Emergency Medicine

## 2015-05-30 DIAGNOSIS — I10 Essential (primary) hypertension: Secondary | ICD-10-CM | POA: Diagnosis not present

## 2015-05-30 DIAGNOSIS — E860 Dehydration: Secondary | ICD-10-CM | POA: Diagnosis not present

## 2015-05-30 DIAGNOSIS — Z79899 Other long term (current) drug therapy: Secondary | ICD-10-CM | POA: Diagnosis not present

## 2015-05-30 DIAGNOSIS — IMO0002 Reserved for concepts with insufficient information to code with codable children: Secondary | ICD-10-CM

## 2015-05-30 DIAGNOSIS — R739 Hyperglycemia, unspecified: Secondary | ICD-10-CM

## 2015-05-30 DIAGNOSIS — Z87891 Personal history of nicotine dependence: Secondary | ICD-10-CM | POA: Diagnosis not present

## 2015-05-30 DIAGNOSIS — E1165 Type 2 diabetes mellitus with hyperglycemia: Secondary | ICD-10-CM | POA: Diagnosis not present

## 2015-05-30 DIAGNOSIS — R42 Dizziness and giddiness: Secondary | ICD-10-CM | POA: Diagnosis present

## 2015-05-30 DIAGNOSIS — Z794 Long term (current) use of insulin: Secondary | ICD-10-CM | POA: Insufficient documentation

## 2015-05-30 DIAGNOSIS — Z7982 Long term (current) use of aspirin: Secondary | ICD-10-CM | POA: Insufficient documentation

## 2015-05-30 LAB — GLUCOSE, CAPILLARY
GLUCOSE-CAPILLARY: 500 mg/dL — AB (ref 65–99)
GLUCOSE-CAPILLARY: 310 mg/dL — AB (ref 65–99)
Glucose-Capillary: 386 mg/dL — ABNORMAL HIGH (ref 65–99)
Glucose-Capillary: 276 mg/dL — ABNORMAL HIGH (ref 65–99)

## 2015-05-30 LAB — URINALYSIS COMPLETE WITH MICROSCOPIC (ARMC ONLY)
BILIRUBIN URINE: NEGATIVE
Bacteria, UA: NONE SEEN
Glucose, UA: >500 mg/dL — AB
KETONES UR: NEGATIVE mg/dL
Leukocytes, UA: NEGATIVE
Nitrite: NEGATIVE
PH: 5 (ref 5.0–8.0)
PROTEIN: NEGATIVE mg/dL
SQUAMOUS EPITHELIAL / LPF: NONE SEEN
Specific Gravity, Urine: 1.031 — ABNORMAL HIGH (ref 1.005–1.030)
WBC, UA: NONE SEEN WBC/hpf (ref 0–5)

## 2015-05-30 LAB — BASIC METABOLIC PANEL
Anion gap: 10 (ref 5–15)
BUN: 13 mg/dL (ref 6–20)
CALCIUM: 9.1 mg/dL (ref 8.9–10.3)
CHLORIDE: 97 mmol/L — AB (ref 101–111)
CO2: 24 mmol/L (ref 22–32)
CREATININE: 0.95 mg/dL (ref 0.61–1.24)
GFR calc Af Amer: >60 mL/min (ref >60)
GFR calc non Af Amer: >60 mL/min (ref >60)
GLUCOSE: 623 mg/dL — AB (ref 65–99)
Potassium: 4.9 mmol/L (ref 3.5–5.1)
Sodium: 131 mmol/L — ABNORMAL LOW (ref 135–145)

## 2015-05-30 LAB — CBC
HCT: 44.7 % (ref 40.0–52.0)
Hemoglobin: 15.1 g/dL (ref 13.0–18.0)
MCH: 28.9 pg (ref 26.0–34.0)
MCHC: 33.8 g/dL (ref 32.0–36.0)
MCV: 85.6 fL (ref 80.0–100.0)
PLATELETS: 215 10*3/uL (ref 150–440)
RBC: 5.22 MIL/uL (ref 4.40–5.90)
RDW: 12.5 % (ref 11.5–14.5)
WBC: 6.2 10*3/uL (ref 3.8–10.6)

## 2015-05-30 LAB — LACTIC ACID, PLASMA
LACTIC ACID, VENOUS: 1.9 mmol/L (ref 0.5–2.0)
Lactic Acid, Venous: 3.5 mmol/L (ref 0.5–2.0)

## 2015-05-30 LAB — BLOOD GAS, VENOUS
Acid-base deficit: 1.3 mmol/L (ref 0.0–2.0)
BICARBONATE: 24.3 meq/L (ref 21.0–28.0)
FIO2: 0.24
PCO2 VEN: 43 mmHg — AB (ref 44.0–60.0)
PH VEN: 7.36 (ref 7.320–7.430)
Patient temperature: 37

## 2015-05-30 LAB — TROPONIN I
Troponin I: <0.03 ng/mL (ref <0.031)
Troponin I: <0.03 ng/mL

## 2015-05-30 LAB — LIPASE, BLOOD: LIPASE: 23 U/L (ref 22–51)

## 2015-05-30 MED ORDER — INSULIN ASPART 100 UNIT/ML ~~LOC~~ SOLN
4.0000 [IU] | Freq: Once | SUBCUTANEOUS | Status: AC
  Administered 2015-05-30: 4 [IU] via SUBCUTANEOUS
  Filled 2015-05-30: qty 1

## 2015-05-30 MED ORDER — SODIUM CHLORIDE 0.9 % IV BOLUS (SEPSIS)
1000.0000 mL | Freq: Once | INTRAVENOUS | Status: AC
  Administered 2015-05-30: 1000 mL via INTRAVENOUS

## 2015-05-30 MED ORDER — INSULIN ASPART 100 UNIT/ML ~~LOC~~ SOLN
12.0000 [IU] | Freq: Once | SUBCUTANEOUS | Status: AC
  Administered 2015-05-30: 12 [IU] via SUBCUTANEOUS
  Filled 2015-05-30: qty 12

## 2015-05-30 NOTE — ED Notes (Signed)
Says he has been feeling bad since yesterday.  Says numbness in extrems.  Dizzy, vision is blurred.  Says sugars are running in 200s recently.

## 2015-05-30 NOTE — Discharge Instructions (Signed)
Please return to the emergency room right away should her symptoms such as today's return, he develop a fever, any chest pain or trouble breathing, he feel weak or dizzy, nausea vomiting, or other new concerns or symptoms arise.  Insulin Treatment for Diabetes Diabetes is a disease that does not go away (chronic). It occurs when the body does not properly use the sugar (glucose) that is released from food after it is digested. Glucose levels are controlled by a hormone called insulin, which is made by your pancreas. Depending on the type of diabetes you have, either of the following will apply:   The pancreas does not make any insulin (type 1 diabetes).  The pancreas makes too little insulin, and the body cannot respond normally to the insulin that is made (type 2 diabetes). Without insulin, death can occur. However, with the addition of insulin, blood sugar monitoring, and treatment, someone with diabetes can live a full and productive life. This document will discuss the role of insulin in your treatment and provide information about its use.  HOW IS INSULIN GIVEN? Insulin is a medicine that can only be given by injection. Taking it by mouth makes it inactive because of the acid in your stomach. Insulin is injected under the skin by a syringe and needle, an insulin pen, a pump, or a jet injector. Your dose will be determined by your health care provider based on your individual needs. You will also be given guidance on which method of giving insulin is right for you. Remember that if you give insulin with a needle and syringe, you must do so using only a special insulin syringe made for this purpose. WHERE ON THE BODY SHOULD INSULIN BE INJECTED? Insulin is injected into the fatty layer of tissue just under your skin. Good places to inject insulin include the upper arm, the front and outer area of the thigh, the hips, and the abdomen. Giving your insulin in the abdomen is preferred because this provides  the most rapid and consistent absorption. Avoid the area 2 inches (5 cm) around the navel and avoid injecting into areas on your body with scar tissue. In addition, it is important to rotate your injection sites with every shot to prevent irritation and improve absorption. WHAT ARE THE DIFFERENT TYPES OF INSULIN?  If you have type 1 diabetes, you must take insulin to stay alive. Your body does not produce it. If you have type 2 diabetes, you might require insulin in addition to, or instead of, other medicines. In either case, proper use of insulin is critical to control your diabetes.  There are a number of different types of insulin. Usually, you will give yourself injections, though others can be trained to give them to you. Some people have an insulin pump that delivers insulin continuously through a tube (cannula) that is placed under the skin. Using insulin requires that you check your blood sugar several times a day. The exact number of times and time of day to check will vary depending on your type of diabetes, your type of insulin, and treatment goals. Your health care provider will direct you.  Generally, different insulins have different properties. The following is a general guide. Specifics will vary by product, and new products are introduced periodically.   Rapid-acting insulin starts working quickly (in as little as 5 minutes) and wears off in 4 to 6 hours (sometimes longer). This type of insulin works well when taken just before a meal to bring your  blood sugar quickly back to normal.   Short-acting insulin starts working in about 30 minutes and can last 6 to 10 hours. This type of insulin should be taken about 30 minutes before you start eating a meal.  Intermediate-acting insulin starts working in 1-2 hours and wears off after about 10 to 18 hours. This insulin will lower your blood sugar for a longer period of time, but it will not be as effective in lowering your blood sugar right  after a meal.   Long-acting insulin mimics the small amount of insulin that your pancreas usually produces throughout the day. You need to have some insulin present at all times. It is crucial to the metabolism of brain cells and other cells. Long-acting insulin is meant to be used either once or twice a day. It is usually used in combination with other types of insulin, or in combination with other diabetes medicines.  Discuss the type of insulin you are taking with your health care provider or pharmacist. You will then be aware of when the insulin can be expected to peak and when it will wear off. This is important to know so you can plan for meal times and periods of exercise.  Your health care provider will usually have a strategy in mind when treating you with insulin. This will vary with your type of diabetes, your diabetes treatment goals, and your health history. It is important that you understand this strategy so you can be an active partner in treating your diabetes. Here are some terms you might hear:   Basal insulin. This refers to the small amount of insulin that needs to be present in your blood at all times. Sometimes oral medicines will be enough. For other people, and especially for people with type 1 diabetes, insulin is needed. Usually, intermediate-acting or long-acting insulin is used once or twice a day to accomplish this.   Prandial (meal-related) insulin. Your blood sugar will rise rapidly after a meal. Rapid-acting or short-acting insulin can be used right before the meal to bring your blood sugar back to normal quickly. You might be instructed to adjust the amount of insulin depending on how much carbohydrate (starch) is in your meal.   Corrective insulin. You might be instructed to check your blood sugar at certain times of the day. You then might use a small amount of rapid-acting or short-acting insulin to bring the blood sugar down to normal if it is elevated.   Tight  control (also called intensive therapy). Tight control means keeping your blood sugar as close to your target as possible and keeping it from going too high after meals. People with tight control of their diabetes are shown to have fewer long-term problems from their diabetes.   Glycohemoglobin (also called glyco, glycosylated hemoglobin, hemoglobin A1c, or A1c) level. This measures how well your blood sugar has been controlled during the past 1 to 3 months. It helps your health care provider see how effective your treatment is and decide if any changes are needed. Your health care provider will discuss your target glycohemoglobin level with you.  Insulin treatment requires your careful attention. Treatment plans will be different for different people. Some people do well with a simple program. Others require more complicated programs, with multiple insulin injections daily. You will work with your health care provider to develop the best program for you. Regardless of your insulin treatment plan, you must also do your best on weight control, diet and food choices,  exercise, blood pressure control, cholesterol control, and stress levels.  WHAT ARE THE SIDE EFFECTS OF INSULIN? Although insulin treatment is important, it does have some side effects, such as:   Insulin can cause your blood sugar to go too low (hypoglycemia).   Weight gain can occur.   Improper injection technique can cause hypoglycemia, blood sugar to go too high (hyperglycemia), skin injury or irritation, or other problems. You must learn to inject insulin properly. Document Released: 12/24/2008 Document Revised: 02/11/2014 Document Reviewed: 03/11/2013 [facility] Patient Information 2015 [place], [place]. This information is not intended to replace advice given to you by your health care provider. Make sure you discuss any questions you have with your health care provider.

## 2015-05-30 NOTE — ED Provider Notes (Signed)
[facility] Emergency Department Provider Note  ____________________________________________  Time seen: Approximately 10:22 AM  I have reviewed the triage vital signs and the nursing notes.   HISTORY  Chief Complaint Eye Problem and Dizziness    HPI [name] is a 34 y.o. male history of coronary bypass, previous heart attack, and insulin-dependent diabetes.He reports that yesterday he felt like headed and weak, this morning while at work he began to feel nauseated, lightheaded as though he was going to pass out. Felt like both eyes "went blurry" for a little while driving his car, but this has improved. He reports he feels leg his heart is pounding fast his chest but does not have any chest pain or trouble breathing. Denies any fevers or chills. States the last time he checked his blood sugar was in the 200s, but has not checked it in the last 24 hours.   Past Medical History  Diagnosis Date  . Coronary atherosclerosis of artery bypass graft     Age 26 -   . Chest pain, unspecified   . Uncontrolled diabetes mellitus with complications 2002    poorly controlled  . HLD (hyperlipidemia)     poorly controlled  . HTN (hypertension)   . Obesity, unspecified   . Hx-TIA (transient ischemic attack) 2009    possible-([facility]) Normal MRI of brain and MRA of the head and neck  . Noncompliance     Patient Active Problem List   Diagnosis Date Noted  . Non compliance with medical treatment 03/24/2015  . Left flank pain 03/24/2015  . Cervical radiculitis 09/04/2014  . Acute prostatitis 07/20/2014  . Diabetes mellitus type 2 with complications, uncontrolled 02/13/2014  . Macular edema 02/13/2014  . HLD (hyperlipidemia) 11/08/2013  . Left shoulder pain 11/08/2013  . Financial difficulties 11/08/2013  . Depression with anxiety 11/08/2013  . Adjustment disorder with anxiety 10/26/2012  . Headache(784.0) 05/04/2011  . OBESITY 12/22/2010  . Atypical  chest pain 03/05/2009  . Hyperlipidemia 03/03/2009  . Essential hypertension 03/03/2009  . CAD, ARTERY BYPASS GRAFT 03/03/2009    Past Surgical History  Procedure Laterality Date  . Coronary artery bypass graft  2007    x 4 (age 63)    Current Outpatient Rx  Name  Route  Sig  Dispense  Refill  . aspirin EC 81 MG tablet   Oral   Take 81 mg by mouth daily.         Marland Kitchen atorvastatin (LIPITOR) 80 MG tablet   Oral   Take 1 tablet (80 mg total) by mouth daily.   90 tablet   3   . escitalopram (LEXAPRO) 10 MG tablet   Oral   Take 1 tablet (10 mg total) by mouth daily.   90 tablet   0   . insulin aspart (NOVOLOG) 100 UNIT/ML injection      Use on a sliding scale between 5-15 units with meals. Max daily 45 units   3 vial   PRN   . Insulin Glargine (LANTUS SOLOSTAR) 100 UNIT/ML Solostar Pen   Subcutaneous   Inject 15 Units into the skin daily at 10 pm. Patient taking differently: Inject 50 Units into the skin 2 (two) times daily.    5 pen   3   . tiZANidine (ZANAFLEX) 4 MG tablet   Oral   Take 1 tablet (4 mg total) by mouth at bedtime.   60 tablet   0   . EPINEPHrine (EPIPEN 2-PAK) 0.3 mg/0.3 mL  IJ SOAJ injection   Intramuscular   Inject 0.3 mLs (0.3 mg total) into the muscle once.   1 Device   0     Allergies Nitroglycerin  Family History  Problem Relation Age of Onset  . Emphysema Father     + smoker  . COPD Father     Died age 72  . Coronary artery disease Maternal Grandfather   . Heart attack Maternal Grandfather   . Diabetes Maternal Grandfather   . Hypertension Maternal Grandfather   . Hyperlipidemia Maternal Grandfather   . Heart disease Maternal Grandfather     CAD  . Lung cancer      paternal uncles and aunts (all smokers)  . Coronary artery disease      premature-family history    Social History Social History  Substance Use Topics  . Smoking status: Former Smoker    Quit date: 10/11/2004  . Smokeless tobacco: Former Neurosurgeon      Comment: used to smoke 1 1/2 ppd  . Alcohol Use: No     Comment: 1-2 drinks per month    Review of Systems Constitutional: No fever, some chills Eyes: See history of present illness ENT: No sore throat. Feels very dry Cardiovascular: Denies chest pain. Feels palpitations Respiratory: Denies shortness of breath. Gastrointestinal: No abdominal pain.  Some nausea no vomiting  No diarrhea.  No constipation. Genitourinary: Negative for dysuria. Increased frequency. Musculoskeletal: Negative for back pain. Skin: Negative for rash. Neurological: Negative for headaches, focal weakness or numbness.  10-point ROS otherwise negative.  ____________________________________________   PHYSICAL EXAM:  VITAL SIGNS: ED Triage Vitals  Enc Vitals Group     BP 05/30/15 0944 131/81 mmHg     Pulse Rate 05/30/15 0944 94     Resp 05/30/15 0944 16     Temp 05/30/15 0944 97.5 F (36.4 C)     Temp Source 05/30/15 0944 Oral     SpO2 05/30/15 0944 98 %     Weight 05/30/15 0944 230 lb (104.327 kg)     Height 05/30/15 0944 6' (1.829 m)     Head Cir --      Peak Flow --      Pain Score --      Pain Loc --      Pain Edu? --      Excl. in GC? --     Constitutional: Alert and oriented. Well appearing and in no acute distress, slight diaphoresis. Eyes: Conjunctivae are normal. PERRL. EOMI. Head: Atraumatic. Nose: No congestion/rhinnorhea. Mouth/Throat: Mucous membranes are dry.  Oropharynx non-erythematous. Neck: No stridor.   Cardiovascular: Normal rate, regular rhythm. Grossly normal heart sounds.  Good peripheral circulation. Old midline sternotomy Respiratory: Normal respiratory effort.  No retractions. Lungs CTAB. Gastrointestinal: Soft and nontender. No distention. No abdominal bruits. No CVA tenderness. Musculoskeletal: No lower extremity tenderness nor edema.  No joint effusions. Neurologic:  Normal speech and language. No gross focal neurologic deficits are appreciated. No gait  instability. Skin:  Skin is warm, dry and intact. No rash noted. Psychiatric: Mood and affect are normal. Speech and behavior are normal.  ____________________________________________   LABS (all labs ordered are listed, but only abnormal results are displayed)  Labs Reviewed  BASIC METABOLIC PANEL - Abnormal; Notable for the following:    Sodium 131 (*)    Chloride 97 (*)    Glucose, Bld 623 (*)    All other components within normal limits  URINALYSIS COMPLETEWITH MICROSCOPIC ([facility] ONLY) - Abnormal; Notable for  the following:    Color, Urine STRAW (*)    APPearance CLEAR (*)    Glucose, UA >500 (*)    Specific Gravity, Urine 1.031 (*)    Hgb urine dipstick 1+ (*)    All other components within normal limits  BLOOD GAS, VENOUS - Abnormal; Notable for the following:    pCO2, Ven 43 (*)    All other components within normal limits  LACTIC ACID, PLASMA - Abnormal; Notable for the following:    Lactic Acid, Venous 3.5 (*)    All other components within normal limits  GLUCOSE, CAPILLARY - Abnormal; Notable for the following:    Glucose-Capillary >600 (*)    All other components within normal limits  GLUCOSE, CAPILLARY - Abnormal; Notable for the following:    Glucose-Capillary 500 (*)    All other components within normal limits  GLUCOSE, CAPILLARY - Abnormal; Notable for the following:    Glucose-Capillary >600 (*)    All other components within normal limits  GLUCOSE, CAPILLARY - Abnormal; Notable for the following:    Glucose-Capillary 386 (*)    All other components within normal limits  GLUCOSE, CAPILLARY - Abnormal; Notable for the following:    Glucose-Capillary 310 (*)    All other components within normal limits  GLUCOSE, CAPILLARY - Abnormal; Notable for the following:    Glucose-Capillary 276 (*)    All other components within normal limits  CBC  LACTIC ACID, PLASMA  LIPASE, BLOOD  TROPONIN I  TROPONIN I  CBG MONITORING, ED  CBG MONITORING, ED  CBG  MONITORING, ED  CBG MONITORING, ED  CBG MONITORING, ED  CBG MONITORING, ED  CBG MONITORING, ED  CBG MONITORING, ED  CBG MONITORING, ED  CBG MONITORING, ED  CBG MONITORING, ED  CBG MONITORING, ED  CBG MONITORING, ED  CBG MONITORING, ED  CBG MONITORING, ED  CBG MONITORING, ED   ____________________________________________  EKG  Both Reviewed and interpreted by me  EKG time 9:45 AM Normal sinus rhythm Ventricular rate 95 PR 134 QRS 96 QTc 4:30 There is J elevation noted in 2-3 aVF and lead 1 as well as slight in V5 V6. Compared with previous EKG from June, it appears that this is likely not a new change. ____________________________________________  Repeat EKG at 945 Normal sinus rhythm Ventricular rate 92 PR 150 QRS 88 QTc 420 There is J elevation noted in 2-3 aVF and lead 1 as well as slight in V5 V6. Compared with previous EKG from June, it appears that this is likely not a new change.   RADIOLOGY  DG Chest Port 1 View (Final result) Result time: 05/30/15 13:54:52   Final result by Rad Results In Interface (05/30/15 13:54:52)   Narrative:   CLINICAL DATA: Acute onset chest pain and palpitations. Previous myocardial infarct. Hypertension.  EXAM: PORTABLE CHEST - 1 VIEW  COMPARISON: None.  FINDINGS: The heart size and mediastinal contours are within normal limits. Both lungs are clear. No evidence of pleural effusion. Prior CABG again noted.  IMPRESSION: No active disease.      ____________________________________________   PROCEDURES  Procedure(s) performed: None  Critical Care performed: No  ----------------------------------------- 10:26 AM on 05/30/2015 -----------------------------------------  At 10 AM I discussed EKG and symptomatology with Dr. Kirke Corin because of the concerns of J-point versus slight ST elevation in the setting of a patient with known coronary disease and diabetes. However, in reviewing his old EKGs and having Dr.  Kirke Corin view, appears there is no acute significant abnormality.  In addition, patient not having any chest pain. ____________________________________________   INITIAL IMPRESSION / ASSESSMENT AND PLAN / ED COURSE  Pertinent labs & imaging results that were available during my care of the patient were reviewed by me and considered in my medical decision making (see chart for details).  Patient's presentation and glucose being greater than 600 are suggestive of possibility of DKA, and likely significant dehydration leading up to today's presentation. He does, however have an abnormal EKG though this appears to be stable for him. He is not having any chest pain but does report palpitations and sweats. Overall, he appears hypovolemic. He is not confused does not have altered mental status. We'll continue to check labs, rule out diabetes ketoacidosis, acute cardiac disease, acute infectious etiologies, etc.  ----------------------------------------- 2:09 PM on 05/30/2015 -----------------------------------------  Patient reports feeling much improved. Blood sugar now less than 300. No evidence of DKA. 2 troponins are negative and no new EKG changes as compared to previous. No chest pain.  Patient reports he artery has follow-up planned on Monday with primary care doctor to address hyperglycemia. I will discharge him home. Discussed with he and his wife close return precautions. Patient agreeable, we'll continue use his insulin at home and follow-up closely with his doctor Monday. He'll return emergency room if his symptoms like today recur, he has other new concerns, develops any chest pain, or other new concerns or symptoms arise. ____________________________________________   FINAL CLINICAL IMPRESSION(S) / ED DIAGNOSES  Final diagnoses:  Uncontrolled diabetes mellitus  Hyperglycemia  Dehydration      Sharyn Creamer, MD 05/30/15 1417

## 2015-05-30 NOTE — ED Notes (Signed)
Blood glucose checked twice, once on right hand and once on left.  Glucometer reading high. Dr. Fanny Bien notified.

## 2015-06-02 ENCOUNTER — Ambulatory Visit (INDEPENDENT_AMBULATORY_CARE_PROVIDER_SITE_OTHER): Payer: Commercial Managed Care - HMO | Admitting: Nurse Practitioner

## 2015-06-02 ENCOUNTER — Encounter (INDEPENDENT_AMBULATORY_CARE_PROVIDER_SITE_OTHER): Payer: Self-pay

## 2015-06-02 VITALS — BP 118/84 | HR 82 | Temp 98.0°F | Resp 18 | Ht 72.0 in | Wt 239.6 lb

## 2015-06-02 DIAGNOSIS — E118 Type 2 diabetes mellitus with unspecified complications: Secondary | ICD-10-CM

## 2015-06-02 DIAGNOSIS — Z91199 Patient's noncompliance with other medical treatment and regimen due to unspecified reason: Secondary | ICD-10-CM

## 2015-06-02 DIAGNOSIS — F418 Other specified anxiety disorders: Secondary | ICD-10-CM | POA: Diagnosis not present

## 2015-06-02 DIAGNOSIS — Z23 Encounter for immunization: Secondary | ICD-10-CM

## 2015-06-02 DIAGNOSIS — Z598 Other problems related to housing and economic circumstances: Secondary | ICD-10-CM | POA: Diagnosis not present

## 2015-06-02 DIAGNOSIS — Z9119 Patient's noncompliance with other medical treatment and regimen: Secondary | ICD-10-CM

## 2015-06-02 DIAGNOSIS — E1165 Type 2 diabetes mellitus with hyperglycemia: Secondary | ICD-10-CM

## 2015-06-02 DIAGNOSIS — IMO0002 Reserved for concepts with insufficient information to code with codable children: Secondary | ICD-10-CM

## 2015-06-02 DIAGNOSIS — Z599 Problem related to housing and economic circumstances, unspecified: Secondary | ICD-10-CM

## 2015-06-02 LAB — HEMOGLOBIN A1C: Hgb A1c MFr Bld: 12 % — ABNORMAL HIGH (ref 4.6–6.5)

## 2015-06-02 MED ORDER — ESCITALOPRAM OXALATE 10 MG PO TABS: 10.0000 mg | ORAL_TABLET | Freq: Every day | ORAL | Status: DC

## 2015-06-02 NOTE — Patient Instructions (Addendum)
Get your eye exam soon, please.   Please take the Lexapro and follow up in 4 weeks from starting the medication.   We will contact you about your referral.   Check your blood sugars at least daily when getting the Novolog and please call Dr. Charlean Sanfilippo office to reschedule your appointment.

## 2015-06-02 NOTE — Progress Notes (Signed)
Pre visit review using our clinic review tool, if applicable. No additional management support is needed unless otherwise documented below in the visit note. 

## 2015-06-02 NOTE — Progress Notes (Signed)
Patient ID: [name], male    DOB: 11/07/1980  Age: 34 y.o. MRN: [id number]  CC: Follow-up   HPI [name] presents for follow up on diabetes and depression.   1) Diabetes- went to ER on 05/30/15-Friday felt like he was having anxiety and then had "red splotchy" vision changes, felt pre-syncopal  Went to ER, BS over 600; no cardiac changes  Currently taking:  50 units am and 50 units pm Lantus VIALS   BS 240's; few days ago checked, but not since then He reports he does not want to "waste supplies" since he is not on a regimen currently and states he will start checking frequently when getting his Novolog. He reports the novolog in vials is still financially tough and will wait till pay day to order.   2) Having financial difficulties, mother in law died recently, and wife is depressed. He reports he is the only one able to provide for the family at this time and he feels very alone. He reports his wife sleeps 3/4 of the day and wakes up when he is home from his part time job.   Never started Lexapro- he reports he never received it, but later changed his story and states he took "whatever came to him" then tried to refill it with no luck. He then began to describe a history of suicidal thoughts with no plan and reports recently he feels more depressed and the thoughts are "more frequent". He has a will to live and does not want to leave his family. He states "I am scared to do anything". He reports a history of suicidal ideations and 1 attempt when he was young, which resulted in commitment. He does not have firearms in the home and reports occasional visual hallucinations of animals specifically "rats".   History [name] has a past medical history of Coronary atherosclerosis of artery bypass graft; Chest pain, unspecified; Uncontrolled diabetes mellitus with complications (2002); HLD (hyperlipidemia); HTN (hypertension); Obesity, unspecified; TIA (transient ischemic attack) (2009); and  Noncompliance.   He has past surgical history that includes Coronary artery bypass graft (2007).   His family history includes COPD in his father; Coronary artery disease in his maternal grandfather and another family member; Diabetes in his maternal grandfather; Emphysema in his father; Heart attack in his maternal grandfather; Heart disease in his maternal grandfather; Hyperlipidemia in his maternal grandfather; Hypertension in his maternal grandfather; Lung cancer in an other family member.He reports that he quit smoking about 10 years ago. He has quit using smokeless tobacco. He reports that he does not drink alcohol or use illicit drugs.  Outpatient Prescriptions Prior to Visit  Medication Sig Dispense Refill  . aspirin EC 81 MG tablet Take 81 mg by mouth daily.    Marland Kitchen atorvastatin (LIPITOR) 80 MG tablet Take 1 tablet (80 mg total) by mouth daily. 90 tablet 3  . EPINEPHrine (EPIPEN 2-PAK) 0.3 mg/0.3 mL IJ SOAJ injection Inject 0.3 mLs (0.3 mg total) into the muscle once. 1 Device 0  . insulin aspart (NOVOLOG) 100 UNIT/ML injection Use on a sliding scale between 5-15 units with meals. Max daily 45 units 3 vial PRN  . Insulin Glargine (LANTUS SOLOSTAR) 100 UNIT/ML Solostar Pen Inject 15 Units into the skin daily at 10 pm. (Patient taking differently: Inject 50 Units into the skin 2 (two) times daily. ) 5 pen 3  . tiZANidine (ZANAFLEX) 4 MG tablet Take 1 tablet (4 mg total) by mouth at bedtime. 60 tablet  0  . escitalopram (LEXAPRO) 10 MG tablet Take 1 tablet (10 mg total) by mouth daily. 90 tablet 0   No facility-administered medications prior to visit.    ROS Review of Systems  Constitutional: Positive for fatigue. Negative for fever, chills and diaphoresis.  Eyes: Negative for visual disturbance.  Respiratory: Negative for chest tightness, shortness of breath and wheezing.   Cardiovascular: Negative for chest pain, palpitations and leg swelling.  Endocrine: Negative for polydipsia,  polyphagia and polyuria.  Neurological: Negative for dizziness, weakness and numbness.  Psychiatric/Behavioral: Positive for suicidal ideas, hallucinations and sleep disturbance. The patient is nervous/anxious.     Objective:  BP 118/84 mmHg  Pulse 82  Temp(Src) 98 F (36.7 C)  Resp 18  Ht 6' (1.829 m)  Wt 239 lb 9.6 oz (108.682 kg)  BMI 32.49 kg/m2  SpO2 97%  Physical Exam  Constitutional: He is oriented to person, place, and time. He appears well-developed and well-nourished. No distress.  HENT:  Head: Normocephalic and atraumatic.  Right Ear: External ear normal.  Left Ear: External ear normal.  Cardiovascular: Normal rate, regular rhythm and normal heart sounds.  Exam reveals no gallop and no friction rub.   No murmur heard. Pulmonary/Chest: Effort normal and breath sounds normal. No respiratory distress. He has no wheezes. He has no rales. He exhibits no tenderness.  Neurological: He is alert and oriented to person, place, and time.  Skin: Skin is warm and dry. No rash noted. He is not diaphoretic.  Psychiatric: He has a normal mood and affect. Judgment normal. His mood appears not anxious. His affect is not angry, not blunt, not labile and not inappropriate. His speech is delayed. His speech is not rapid and/or pressured, not tangential and not slurred. He is slowed. Thought content is not paranoid and not delusional. Cognition and memory are not impaired. He does not express impulsivity or inappropriate judgment. He does not exhibit a depressed mood. He expresses suicidal ideation. He expresses no homicidal ideation. He expresses no suicidal plans and no homicidal plans. He is communicative. He exhibits normal recent memory and normal remote memory. He is inattentive.    Assessment & Plan:   [name] was seen today for follow-up.  Diagnoses and all orders for this visit:  Diabetes mellitus type 2 with complications, uncontrolled -     HgB A1c  Depression with anxiety -      Ambulatory referral to Psychiatry  Encounter for immunization  Non compliance with medical treatment  Financial difficulties  Other orders -     escitalopram (LEXAPRO) 10 MG tablet; Take 1 tablet (10 mg total) by mouth daily. -     Flu Vaccine QUAD 36+ mos IM   I am having [name] maintain his Insulin Glargine, tiZANidine, EPINEPHrine, atorvastatin, insulin aspart, aspirin EC, and escitalopram.  Meds ordered this encounter  Medications  . escitalopram (LEXAPRO) 10 MG tablet    Sig: Take 1 tablet (10 mg total) by mouth daily.    Dispense:  90 tablet    Refill:  0    Order Specific Question:  Supervising Provider    Answer:  Sherlene Shams [[id number]]     Follow-up: Return in about 6 weeks (around 07/14/2015).

## 2015-06-04 ENCOUNTER — Telehealth: Payer: Self-pay | Admitting: *Deleted

## 2015-06-04 NOTE — Telephone Encounter (Signed)
Patient called and stated that he had a blood sugar on his left hand reading 426, he rechecked on his right hand and it read 446. Patient questioned the reason for the different numbers on each hand.

## 2015-06-12 ENCOUNTER — Encounter: Payer: Self-pay | Admitting: Nurse Practitioner

## 2015-06-12 NOTE — Assessment & Plan Note (Signed)
Patient is still noncompliant in following up, with medications, and is disjointed in his history. Due to patient psychiatric concerns will send to specialist. If no changes after next visit may consider discharge from practice.

## 2015-06-12 NOTE — Assessment & Plan Note (Signed)
Patient reports that he has lost the number to [facility]'s office. Patient was given phone number to call and schedule an appointment. Told patient that this is imperative for care. Patient is still uncontrolled with his diabetes and we discussed possibility of losing his limb or early death. Patient verbalized understanding. We'll obtain A1c today

## 2015-06-12 NOTE — Assessment & Plan Note (Signed)
Patient reports he is still unable to have help from different resources. He still reports that his NovoLog vials are expensive and he is not taking the NovoLog. Will refer to endocrinology for further help. Patient is disjointed history and reports that he has tried calling other people without success of a return call.

## 2015-06-12 NOTE — Assessment & Plan Note (Signed)
Patient still having trouble with depression and anxiety. Patient feels lonely and has suicidal thoughts without plan of suicide. Patient has will to live for son and wife. Patient's wife is going through complicated grieving due to loss of mother. Patient will start back on Lexapro 10 mg. Will follow up in 6 weeks. Refer to psychiatry due to thoughts of suicide and for visual hallucinations.

## 2015-06-25 ENCOUNTER — Ambulatory Visit: Payer: Commercial Managed Care - HMO | Admitting: Nurse Practitioner

## 2015-06-26 ENCOUNTER — Emergency Department: Payer: Commercial Managed Care - HMO

## 2015-06-26 ENCOUNTER — Emergency Department
Admission: EM | Admit: 2015-06-26 | Discharge: 2015-06-26 | Disposition: A | Discharge status: Home / Self Care | Payer: Commercial Managed Care - HMO | Attending: Emergency Medicine | Admitting: Emergency Medicine

## 2015-06-26 DIAGNOSIS — Z87891 Personal history of nicotine dependence: Secondary | ICD-10-CM | POA: Insufficient documentation

## 2015-06-26 DIAGNOSIS — R1013 Epigastric pain: Secondary | ICD-10-CM | POA: Diagnosis not present

## 2015-06-26 DIAGNOSIS — Z794 Long term (current) use of insulin: Secondary | ICD-10-CM | POA: Insufficient documentation

## 2015-06-26 DIAGNOSIS — Z7982 Long term (current) use of aspirin: Secondary | ICD-10-CM | POA: Insufficient documentation

## 2015-06-26 DIAGNOSIS — I2510 Atherosclerotic heart disease of native coronary artery without angina pectoris: Secondary | ICD-10-CM | POA: Diagnosis not present

## 2015-06-26 DIAGNOSIS — Z951 Presence of aortocoronary bypass graft: Secondary | ICD-10-CM | POA: Insufficient documentation

## 2015-06-26 DIAGNOSIS — E1169 Type 2 diabetes mellitus with other specified complication: Secondary | ICD-10-CM | POA: Diagnosis not present

## 2015-06-26 DIAGNOSIS — I10 Essential (primary) hypertension: Secondary | ICD-10-CM | POA: Insufficient documentation

## 2015-06-26 DIAGNOSIS — Z79899 Other long term (current) drug therapy: Secondary | ICD-10-CM | POA: Diagnosis not present

## 2015-06-26 DIAGNOSIS — R079 Chest pain, unspecified: Secondary | ICD-10-CM | POA: Insufficient documentation

## 2015-06-26 LAB — CBC
HCT: 42.6 % (ref 40.0–52.0)
Hemoglobin: 14.5 g/dL (ref 13.0–18.0)
MCH: 28.9 pg (ref 26.0–34.0)
MCHC: 34 g/dL (ref 32.0–36.0)
MCV: 84.9 fL (ref 80.0–100.0)
PLATELETS: 222 10*3/uL (ref 150–440)
RBC: 5.02 MIL/uL (ref 4.40–5.90)
RDW: 12.5 % (ref 11.5–14.5)
WBC: 5.6 10*3/uL (ref 3.8–10.6)

## 2015-06-26 LAB — BASIC METABOLIC PANEL
Anion gap: 7 (ref 5–15)
BUN: 12 mg/dL (ref 6–20)
CALCIUM: 8.9 mg/dL (ref 8.9–10.3)
CO2: 27 mmol/L (ref 22–32)
CREATININE: 0.72 mg/dL (ref 0.61–1.24)
Chloride: 105 mmol/L (ref 101–111)
GFR calc Af Amer: >60 mL/min (ref >60)
GFR calc non Af Amer: >60 mL/min (ref >60)
GLUCOSE: 283 mg/dL — AB (ref 65–99)
Potassium: 4 mmol/L (ref 3.5–5.1)
Sodium: 139 mmol/L (ref 135–145)

## 2015-06-26 LAB — TROPONIN I: Troponin I: <0.03 ng/mL (ref <0.031)

## 2015-06-26 LAB — PROTIME-INR
INR: 0.93
Prothrombin Time: 12.7 seconds (ref 11.4–15.0)

## 2015-06-26 LAB — HEPATIC FUNCTION PANEL
ALBUMIN: 3.4 g/dL — AB (ref 3.5–5.0)
ALT: 25 U/L (ref 17–63)
AST: 17 U/L (ref 15–41)
Alkaline Phosphatase: 39 U/L (ref 38–126)
BILIRUBIN TOTAL: 0.9 mg/dL (ref 0.3–1.2)
Total Protein: 6.1 g/dL — ABNORMAL LOW (ref 6.5–8.1)

## 2015-06-26 LAB — GLUCOSE, CAPILLARY: Glucose-Capillary: 248 mg/dL — ABNORMAL HIGH (ref 65–99)

## 2015-06-26 LAB — LIPASE, BLOOD: Lipase: 22 U/L (ref 22–51)

## 2015-06-26 LAB — APTT: aPTT: 26 seconds (ref 24–36)

## 2015-06-26 MED ORDER — ONDANSETRON HCL 4 MG/2ML IJ SOLN
INTRAMUSCULAR | Status: AC
  Administered 2015-06-26: 4 mg via INTRAVENOUS
  Filled 2015-06-26: qty 2

## 2015-06-26 MED ORDER — SODIUM CHLORIDE 0.9 % IV BOLUS (SEPSIS)
250.0000 mL | Freq: Once | INTRAVENOUS | Status: AC
  Administered 2015-06-26: 250 mL via INTRAVENOUS

## 2015-06-26 MED ORDER — GI COCKTAIL ~~LOC~~
ORAL | Status: AC
  Administered 2015-06-26: 30 mL via ORAL
  Filled 2015-06-26: qty 30

## 2015-06-26 MED ORDER — ASPIRIN 81 MG PO CHEW
CHEWABLE_TABLET | ORAL | Status: AC
  Filled 2015-06-26: qty 4

## 2015-06-26 MED ORDER — ASPIRIN 81 MG PO CHEW
324.0000 mg | CHEWABLE_TABLET | Freq: Once | ORAL | Status: AC
  Administered 2015-06-26: 324 mg via ORAL

## 2015-06-26 MED ORDER — GI COCKTAIL ~~LOC~~
30.0000 mL | Freq: Once | ORAL | Status: AC
  Administered 2015-06-26: 30 mL via ORAL

## 2015-06-26 MED ORDER — GI COCKTAIL ~~LOC~~: 30.0000 mL | Freq: Once | ORAL | Status: AC

## 2015-06-26 MED ORDER — ONDANSETRON HCL 4 MG/2ML IJ SOLN
4.0000 mg | Freq: Once | INTRAMUSCULAR | Status: AC
  Administered 2015-06-26: 4 mg via INTRAVENOUS

## 2015-06-26 MED ORDER — FAMOTIDINE 20 MG PO TABS: 20.0000 mg | ORAL_TABLET | Freq: Every day | ORAL | Status: DC

## 2015-06-26 MED ORDER — MORPHINE SULFATE (PF) 4 MG/ML IV SOLN
4.0000 mg | Freq: Once | INTRAVENOUS | Status: AC
  Administered 2015-06-26: 4 mg via INTRAVENOUS

## 2015-06-26 MED ORDER — MORPHINE SULFATE (PF) 4 MG/ML IV SOLN
INTRAVENOUS | Status: AC
  Administered 2015-06-26: 4 mg via INTRAVENOUS
  Filled 2015-06-26: qty 1

## 2015-06-26 NOTE — Discharge Instructions (Signed)
Abdominal Pain °Many things can cause abdominal pain. Usually, abdominal pain is not caused by a disease and will improve without treatment. It can often be observed and treated at home. Your health care provider will do a physical exam and possibly order blood tests and X-rays to help determine the seriousness of your pain. However, in many cases, more time must pass before a clear cause of the pain can be found. Before that point, your health care provider may not know if you need more testing or further treatment. °HOME CARE INSTRUCTIONS  °Monitor your abdominal pain for any changes. The following actions may help to alleviate any discomfort you are experiencing: °· Only take over-the-counter or prescription medicines as directed by your health care provider. °· Do not take laxatives unless directed to do so by your health care provider. °· Try a clear liquid diet (broth, tea, or water) as directed by your health care provider. Slowly move to a bland diet as tolerated. °SEEK MEDICAL CARE IF: °· You have unexplained abdominal pain. °· You have abdominal pain associated with nausea or diarrhea. °· You have pain when you urinate or have a bowel movement. °· You experience abdominal pain that wakes you in the night. °· You have abdominal pain that is worsened or improved by eating food. °· You have abdominal pain that is worsened with eating fatty foods. °· You have a fever. °SEEK IMMEDIATE MEDICAL CARE IF:  °· Your pain does not go away within 2 hours. °· You keep throwing up (vomiting). °· Your pain is felt only in portions of the abdomen, such as the right side or the left lower portion of the abdomen. °· You pass bloody or black tarry stools. °MAKE SURE YOU: °· Understand these instructions.   °· Will watch your condition.   °· Will get help right away if you are not doing well or get worse.   °Document Released: 07/07/2005 Document Revised: 10/02/2013 Document Reviewed: 06/06/2013 °[employer]® Patient Information  ©2015 [employer]. This information is not intended to replace advice given to you by your health care provider. Make sure you discuss any questions you have with your health care provider. ° °Chest Pain (Nonspecific) °It is often hard to give a specific diagnosis for the cause of chest pain. There is always a chance that your pain could be related to something serious, such as a heart attack or a blood clot in the lungs. You need to follow up with your health care provider for further evaluation. °CAUSES  °· Heartburn. °· Pneumonia or bronchitis. °· Anxiety or stress. °· Inflammation around your heart (pericarditis) or lung (pleuritis or pleurisy). °· A blood clot in the lung. °· A collapsed lung (pneumothorax). It can develop suddenly on its own (spontaneous pneumothorax) or from trauma to the chest. °· Shingles infection (herpes zoster virus). °The chest wall is composed of bones, muscles, and cartilage. Any of these can be the source of the pain. °· The bones can be bruised by injury. °· The muscles or cartilage can be strained by coughing or overwork. °· The cartilage can be affected by inflammation and become sore (costochondritis). °DIAGNOSIS  °Lab tests or other studies may be needed to find the cause of your pain. Your health care provider may have you take a test called an ambulatory electrocardiogram (ECG). An ECG records your heartbeat patterns over a 24-hour period. You may also have other tests, such as: °· Transthoracic echocardiogram (TTE). During echocardiography, sound waves are used to evaluate how blood   flows through your heart. °· Transesophageal echocardiogram (TEE). °· Cardiac monitoring. This allows your health care provider to monitor your heart rate and rhythm in real time. °· Holter monitor. This is a portable device that records your heartbeat and can help diagnose heart arrhythmias. It allows your health care provider to track your heart activity for several days, if needed. °· Stress  tests by exercise or by giving medicine that makes the heart beat faster. °TREATMENT  °· Treatment depends on what may be causing your chest pain. Treatment may include: °¨ Acid blockers for heartburn. °¨ Anti-inflammatory medicine. °¨ Pain medicine for inflammatory conditions. °¨ Antibiotics if an infection is present. °· You may be advised to change lifestyle habits. This includes stopping smoking and avoiding alcohol, caffeine, and chocolate. °· You may be advised to keep your head raised (elevated) when sleeping. This reduces the chance of acid going backward from your stomach into your esophagus. °Most of the time, nonspecific chest pain will improve within 2-3 days with rest and mild pain medicine.  °HOME CARE INSTRUCTIONS  °· If antibiotics were prescribed, take them as directed. Finish them even if you start to feel better. °· For the next few days, avoid physical activities that bring on chest pain. Continue physical activities as directed. °· Do not use any tobacco products, including cigarettes, chewing tobacco, or electronic cigarettes. °· Avoid drinking alcohol. °· Only take medicine as directed by your health care provider. °· Follow your health care provider's suggestions for further testing if your chest pain does not go away. °· Keep any follow-up appointments you made. If you do not go to an appointment, you could develop lasting (chronic) problems with pain. If there is any problem keeping an appointment, call to reschedule. °SEEK MEDICAL CARE IF:  °· Your chest pain does not go away, even after treatment. °· You have a rash with blisters on your chest. °· You have a fever. °SEEK IMMEDIATE MEDICAL CARE IF:  °· You have increased chest pain or pain that spreads to your arm, neck, jaw, back, or abdomen. °· You have shortness of breath. °· You have an increasing cough, or you cough up blood. °· You have severe back or abdominal pain. °· You feel nauseous or vomit. °· You have severe weakness. °· You  faint. °· You have chills. °This is an emergency. Do not wait to see if the pain will go away. Get medical help at once. Call your local emergency services (911 in [place] Do not drive yourself to the hospital. °MAKE SURE YOU:  °· Understand these instructions. °· Will watch your condition. °· Will get help right away if you are not doing well or get worse. °Document Released: 07/07/2005 Document Revised: 10/02/2013 Document Reviewed: 05/02/2008 °[employer]® Patient Information ©2015 [employer]. This information is not intended to replace advice given to you by your health care provider. Make sure you discuss any questions you have with your health care provider. ° °

## 2015-06-26 NOTE — ED Notes (Signed)
Pt to triage via w/c with no distress noted; st awoke about 6am with left sided CP radiating into neck & between shoulder blades accomp by SOB; pt reports hx MI and card surg

## 2015-06-26 NOTE — ED Provider Notes (Addendum)
[facility] Emergency Department Provider Note  ____________________________________________  Time seen: Approximately 8:02 AM  I have reviewed the triage vital signs and the nursing notes.   HISTORY  Chief Complaint Chest Pain    HPI [name] is a 34 y.o. male with a history of insulin-dependent diabetes as well as a history of coronary artery bypass graft in 2007, presents today with epigastric abdominal pain. He states that he has had loose stools for last 2-3 days with no melena or bright red blood. He states that he began having epigastric pain at 6 AM this morning. It did not feel like his prior cardiac disease. However, it does seem to radiate "all directions". Patient did not take his insulin this morning. He states his sugars "running high". He does have a history of poorly controlled diabetes mellitus. He describes the pain as a pressure-like sensation which is also sharp. He denies any recent travel, history of PE or DVT and self or family, recent surgery, or lower extremity swelling asymmetry. He states he does drink alcohol occasionally, but he has not had any recently that he can recall. He did have spicy food yesterday. He does have a history of indigestion he states and this feels more like indigestion and heart problems. Patient is very anxious about this he states.  Past Medical History  Diagnosis Date  . Coronary atherosclerosis of artery bypass graft     Age 8 - [place]  . Chest pain, unspecified   . Uncontrolled diabetes mellitus with complications 2002    poorly controlled  . HLD (hyperlipidemia)     poorly controlled  . HTN (hypertension)   . Obesity, unspecified   . Hx-TIA (transient ischemic attack) 2009    possible-([facility]) Normal MRI of brain and MRA of the head and neck  . Noncompliance     Patient Active Problem List   Diagnosis Date Noted  . Non compliance with medical treatment 03/24/2015  . Left flank pain 03/24/2015   . Cervical radiculitis 09/04/2014  . Acute prostatitis 07/20/2014  . Diabetes mellitus type 2 with complications, uncontrolled 02/13/2014  . Macular edema 02/13/2014  . HLD (hyperlipidemia) 11/08/2013  . Left shoulder pain 11/08/2013  . Financial difficulties 11/08/2013  . Depression with anxiety 11/08/2013  . Adjustment disorder with anxiety 10/26/2012  . Headache(784.0) 05/04/2011  . OBESITY 12/22/2010  . Atypical chest pain 03/05/2009  . Hyperlipidemia 03/03/2009  . Essential hypertension 03/03/2009  . CAD, ARTERY BYPASS GRAFT 03/03/2009    Past Surgical History  Procedure Laterality Date  . Coronary artery bypass graft  2007    x 4 (age 54)    Current Outpatient Rx  Name  Route  Sig  Dispense  Refill  . aspirin EC 81 MG tablet   Oral   Take 81 mg by mouth daily.         Marland Kitchen atorvastatin (LIPITOR) 80 MG tablet   Oral   Take 1 tablet (80 mg total) by mouth daily.   90 tablet   3   . EPINEPHrine (EPIPEN 2-PAK) 0.3 mg/0.3 mL IJ SOAJ injection   Intramuscular   Inject 0.3 mLs (0.3 mg total) into the muscle once.   1 Device   0   . escitalopram (LEXAPRO) 10 MG tablet   Oral   Take 1 tablet (10 mg total) by mouth daily.   90 tablet   0   . insulin aspart (NOVOLOG) 100 UNIT/ML injection      Use  on a sliding scale between 5-15 units with meals. Max daily 45 units   3 vial   PRN   . Insulin Glargine (LANTUS SOLOSTAR) 100 UNIT/ML Solostar Pen   Subcutaneous   Inject 15 Units into the skin daily at 10 pm. Patient taking differently: Inject 50 Units into the skin 2 (two) times daily.    5 pen   3   . tiZANidine (ZANAFLEX) 4 MG tablet   Oral   Take 1 tablet (4 mg total) by mouth at bedtime.   60 tablet   0     Allergies Nitroglycerin  Family History  Problem Relation Age of Onset  . Emphysema Father     + smoker  . COPD Father     Died age 58  . Coronary artery disease Maternal Grandfather   . Heart attack Maternal Grandfather   . Diabetes  Maternal Grandfather   . Hypertension Maternal Grandfather   . Hyperlipidemia Maternal Grandfather   . Heart disease Maternal Grandfather     CAD  . Lung cancer      paternal uncles and aunts (all smokers)  . Coronary artery disease      premature-family history    Social History Social History  Substance Use Topics  . Smoking status: Former Smoker    Quit date: 10/11/2004  . Smokeless tobacco: Former Neurosurgeon     Comment: used to smoke 1 1/2 ppd  . Alcohol Use: No     Comment: 1-2 drinks per month    Review of Systems Constitutional: No fever/chills Eyes: No visual changes. ENT: No sore throat. Cardiovascular: See history of present illness Respiratory: Denies shortness of breath. Gastrointestinal: No abdominal pain.  No nausea, no vomiting.  No diarrhea.  No constipation. Genitourinary: Negative for dysuria. Musculoskeletal: Negative for back pain. Skin: Negative for rash. Neurological: Negative for headaches, focal weakness or numbness. Allergic/Immunilogical: **} 10-point ROS otherwise negative.  ____________________________________________   PHYSICAL EXAM:  VITAL SIGNS: ED Triage Vitals  Enc Vitals Group     BP 06/26/15 0656 125/81 mmHg     Pulse Rate 06/26/15 0656 89     Resp 06/26/15 0656 20     Temp 06/26/15 0656 97.9 F (36.6 C)     Temp Source 06/26/15 0656 Oral     SpO2 06/26/15 0656 97 %     Weight 06/26/15 0656 234 lb (106.142 kg)     Height 06/26/15 0656 6' (1.829 m)     Head Cir --      Peak Flow --      Pain Score 06/26/15 0656 10     Pain Loc --      Pain Edu? --      Excl. in GC? --     Constitutional: Alert and oriented. Well appearing and in no acute distress., Patient is quite anxious and upset however Eyes: Conjunctivae are normal. PERRL. EOMI. Head: Atraumatic. Nose: No congestion/rhinnorhea. Mouth/Throat: Mucous membranes are moist.  Oropharynx non-erythematous. Neck: No stridor.   Cardiovascular: Normal rate, regular rhythm.  Grossly normal heart sounds.  Good peripheral circulation. Respiratory: Normal respiratory effort.  No retractions. Lungs CTAB. Gastrointestinal: Soft there is very reducible epigastric discomfort which does reproduce the patient's pain No distention. No abdominal bruits. No CVA tenderness. There is no right lower quadrant tenderness there is no guarding there is no rebound, nonsurgical abdomen. Musculoskeletal: No lower extremity tenderness nor edema.  No joint effusions. There is no asymmetry there is no Homans sign and there  is no DVT signs Neurologic:  Normal speech and language. No gross focal neurologic deficits are appreciated. No gait instability. Skin:  Skin is warm, dry and intact. No rash noted. Psychiatric: Mood and affect are quite anxious. Speech and behavior are normal.  ____________________________________________   LABS (all labs ordered are listed, but only abnormal results are displayed)  Labs Reviewed  CBC  PROTIME-INR  APTT  BASIC METABOLIC PANEL  TROPONIN I  LIPASE, BLOOD  HEPATIC FUNCTION PANEL  GLUCOSE, RANDOM   ____________________________________________  EKG  Normal sinus rhythm rate 87 bpm no acute ST elevation or acute ST depression occasional PVCs noted, normal axis, PR interval 136 QTc 4:30 ____________________________________________  RADIOLOGY I have reviewed x-rays Radiology read the x-ray is negative ____________________________________________   PROCEDURES  Procedure(s) performed: None  Critical Care performed: None  ____________________________________________   INITIAL IMPRESSION / ASSESSMENT AND PLAN / ED COURSE  Pertinent labs & imaging results that were available during my care of the patient were reviewed by me and considered in my medical decision making (see chart for details).  34 year old male with a remarkable history of a CABG already in his young life presents today with pain which she describes as epigastric and  different from his cardiac pain. It is quite reproducible. It is in the context of loose stools. He has no evidence of GI bleed. We will of course obtain blood work to rule out cardiac etiology, low suspicion for dissection low suspicion for PE. Patient has no risk factors for PE and fact.  ----------------------------------------- 8:06 AM on 06/26/2015 -----------------------------------------  , patient states he is chest pain-free although he still has reproduce will epigastric discomfort. He is less anxious, his vital signs remained reassuring. We are continuing to wait patient blood work results and we will discuss with cardiology. Gallbladder disease remains a possibility, liver function tests and pancreatic enzymes have been obtained. They are pending. Patient is stable at this time, sinus on the monitor. ____________________________________________  ----------------------------------------- 8:25 AM on 06/26/2015 ----------------------------------------- As remains chest pain-free, this time his only concern is as epigastric discomfort which is greatly ameliorated by the GI cocktail. Obviously, a GI cocktail is insufficient to rule out cardiac etiology however, I'm very encouraged by his EKG, vital signs, exam, and thus far negative cardiac enzymes. We will cycle them a second time given his history. If he remains stable, we will obtain an ultrasound of his right upper quadrant to rule out gallbladder disease. However, this epigastric discomfort at this time seems less consistent with cardiac and more consistent with a GI etiology at least with the information thus far available.  ----------------------------------------- 10:03 AM on 06/26/2015 -----------------------------------------  Patient resting completely with no discomfort at this time. I discussed with his cardiologist, Dr.Gollan, he knows the patient quite well. We reviewed patient's findings including imaging and blood work and  vital signs. Cardiology plan for this patient is discharged home with outpatient follow-up. I do not think this is unreasonable. We'll send a second cardiac markers a precaution. The patient has epigastric reproducible discomfort with a normal ultrasound normal blood work and reassuring vital signs with no evidence of progression of abdominal pain on serial exams. There is certainly nothing of the sinus suggest ACS,. The patient does have very elevated sugars at baseline, and states that "the sugars are pretty good for me". We have encouraged him to be compliant with his medications. According to cardiology the patient's hemoglobin A1c is usually over 13. He has no evidence of DKA  or anion gap. In conjunction therefore with his cardiology service, we will discharge him home with close follow-up. Return precautions and follow-up given and understood. Serial abdominal exams show no evidence of lower abdominal discomfort, and I do not believe the patient has obstruction, appendicitis, pancreatitis or other life-threatening disease process at this time.   ----------------------------------------- 11:59 AM on 06/26/2015 -----------------------------------------  Patient in no acute distress resting comfortably in the bed. His pain is gone, serial abdominal exams show no evidence of acute pathology, certainly no evidence of appendicitis or surgical abdomen. Patient is eager to go home, return precautions and follow-up stressed and understood. FINAL CLINICAL IMPRESSION(S) / ED DIAGNOSES  Final diagnoses:  Chest pain     Jeanmarie Plant, MD 06/26/15 1006  Jeanmarie Plant, MD 06/26/15 1200

## 2015-08-12 ENCOUNTER — Ambulatory Visit (INDEPENDENT_AMBULATORY_CARE_PROVIDER_SITE_OTHER): Payer: Commercial Managed Care - HMO | Admitting: Nurse Practitioner

## 2015-08-12 VITALS — BP 120/82 | HR 94 | Temp 97.6°F | Resp 18 | Ht 72.0 in | Wt 235.0 lb

## 2015-08-12 DIAGNOSIS — E1165 Type 2 diabetes mellitus with hyperglycemia: Secondary | ICD-10-CM | POA: Diagnosis not present

## 2015-08-12 DIAGNOSIS — E118 Type 2 diabetes mellitus with unspecified complications: Secondary | ICD-10-CM

## 2015-08-12 DIAGNOSIS — Z9119 Patient's noncompliance with other medical treatment and regimen: Secondary | ICD-10-CM | POA: Diagnosis not present

## 2015-08-12 DIAGNOSIS — M5412 Radiculopathy, cervical region: Secondary | ICD-10-CM | POA: Diagnosis not present

## 2015-08-12 DIAGNOSIS — Z91199 Patient's noncompliance with other medical treatment and regimen due to unspecified reason: Secondary | ICD-10-CM

## 2015-08-12 DIAGNOSIS — Z794 Long term (current) use of insulin: Secondary | ICD-10-CM

## 2015-08-12 DIAGNOSIS — IMO0002 Reserved for concepts with insufficient information to code with codable children: Secondary | ICD-10-CM

## 2015-08-12 MED ORDER — TIZANIDINE HCL 4 MG PO TABS
4.0000 mg | ORAL_TABLET | Freq: Four times a day (QID) | ORAL | Status: DC | PRN
Start: 2015-08-12 — End: 2016-06-28

## 2015-08-12 MED ORDER — GABAPENTIN 300 MG PO CAPS: 300.0000 mg | ORAL_CAPSULE | Freq: Every day | ORAL | Status: DC

## 2015-08-12 NOTE — Patient Instructions (Addendum)
Monday November 21st at 0815 in the morning with Dr. Elvera LennoxGherghe.   [address] [place] [facility]  Please take Gabapentin at night before bed for 7 days then take 1 capsule twice daily on day 8 and after. Follow up in 2 weeks.   Work on diet and exercise to help control your blood sugar.   I will work on meal time insulin and call you this week.

## 2015-08-12 NOTE — Progress Notes (Signed)
Patient ID: [name], male    DOB: 09/01/1981  Age: 34 y.o. MRN: [id number]  CC: Shoulder Pain   HPI [name] presents for CC of Numbness of left shoulder and arm x several months.   1) Worsening recently  Radiating to right side down to hand as well Shoulder blade painful Tizanidine 3 tablets at night Feels like hand is swollen Denies rashes or discolored skin    2) DM type II 50 units if over 350  Checking blood sugars infrequently usually over 300. Cannot afford Novolog. Has not called to re-schedule his endocrinologist appointment.   History [name] has a past medical history of Coronary atherosclerosis of artery bypass graft; Chest pain, unspecified; Uncontrolled diabetes mellitus with complications (2002); HLD (hyperlipidemia); HTN (hypertension); Obesity, unspecified; TIA (transient ischemic attack) (2009); and Noncompliance.   He has past surgical history that includes Coronary artery bypass graft (2007).   His family history includes COPD in his father; Coronary artery disease in his maternal grandfather and another family member; Diabetes in his maternal grandfather; Emphysema in his father; Heart attack in his maternal grandfather; Heart disease in his maternal grandfather; Hyperlipidemia in his maternal grandfather; Hypertension in his maternal grandfather; Lung cancer in an other family member.He reports that he quit smoking about 10 years ago. He has quit using smokeless tobacco. He reports that he does not drink alcohol or use illicit drugs.  Outpatient Prescriptions Prior to Visit  Medication Sig Dispense Refill  . aspirin EC 81 MG tablet Take 81 mg by mouth daily.    Marland Kitchen. atorvastatin (LIPITOR) 80 MG tablet Take 1 tablet (80 mg total) by mouth daily. (Patient taking differently: Take 80 mg by mouth every morning. ) 90 tablet 3  . EPINEPHrine (EPIPEN 2-PAK) 0.3 mg/0.3 mL IJ SOAJ injection Inject 0.3 mLs (0.3 mg total) into the muscle once. (Patient not taking: Reported  on 06/26/2015) 1 Device 0  . escitalopram (LEXAPRO) 10 MG tablet Take 1 tablet (10 mg total) by mouth daily. 90 tablet 0  . famotidine (PEPCID) 20 MG tablet Take 1 tablet (20 mg total) by mouth daily. 30 tablet 1  . insulin aspart (NOVOLOG) 100 UNIT/ML injection Use on a sliding scale between 5-15 units with meals. Max daily 45 units (Patient not taking: Reported on 06/26/2015) 3 vial PRN  . Insulin Glargine (LANTUS SOLOSTAR) 100 UNIT/ML Solostar Pen Inject 15 Units into the skin daily at 10 pm. (Patient taking differently: Inject 50 Units into the skin 2 (two) times daily. ) 5 pen 3  . tiZANidine (ZANAFLEX) 4 MG tablet Take 1 tablet (4 mg total) by mouth at bedtime. 60 tablet 0   No facility-administered medications prior to visit.    ROS Review of Systems  Constitutional: Negative for fever, chills, diaphoresis and fatigue.  Eyes: Negative for visual disturbance.  Respiratory: Negative for chest tightness, shortness of breath and wheezing.   Cardiovascular: Negative for chest pain, palpitations and leg swelling.  Gastrointestinal: Negative for nausea, vomiting and diarrhea.  Endocrine: Negative for polydipsia, polyphagia and polyuria.  Musculoskeletal: Positive for myalgias and arthralgias.       Left shoulder  Skin: Negative for rash.  Neurological: Positive for numbness. Negative for dizziness and weakness.  Psychiatric/Behavioral: The patient is not nervous/anxious.     Objective:  BP 120/82 mmHg  Pulse 94  Temp(Src) 97.6 F (36.4 C)  Resp 18  Ht 6' (1.829 m)  Wt 235 lb (106.595 kg)  BMI 31.86 kg/m2  SpO2 97%  Physical Exam  Constitutional: He is oriented to person, place, and time. He appears well-developed and well-nourished. No distress.  HENT:  Head: Normocephalic and atraumatic.  Right Ear: External ear normal.  Left Ear: External ear normal.  Cardiovascular: Normal rate, regular rhythm, normal heart sounds and intact distal pulses.  Exam reveals no gallop and no  friction rub.   No murmur heard. Pulmonary/Chest: Effort normal and breath sounds normal. No respiratory distress. He has no wheezes. He has no rales. He exhibits no tenderness.  Musculoskeletal: Normal range of motion. He exhibits no edema or tenderness.  Neurological: He is alert and oriented to person, place, and time.  Grip strength equal bilaterally, sensation is dull to soft touch, did not assess painful sensation  Skin: Skin is warm and dry. No rash noted. He is not diaphoretic.  Psychiatric: His speech is normal. His affect is blunt. Cognition and memory are normal. He expresses inappropriate judgment. He does not express impulsivity.  Patient seems to have poor follow through and his thoughts are tangential.  He is inattentive.      Assessment & Plan:   [name] was seen today for shoulder pain.  Diagnoses and all orders for this visit:  Non compliance with medical treatment  Uncontrolled type 2 diabetes mellitus with complication, with long-term current use of insulin (HCC)  Cervical radiculitis  Other orders -     gabapentin (NEURONTIN) 300 MG capsule; Take 1 capsule (300 mg total) by mouth at bedtime. Take for 7 days then up to 1 capsule twice daily afterwards. -     tiZANidine (ZANAFLEX) 4 MG tablet; Take 1 tablet (4 mg total) by mouth every 6 (six) hours as needed for muscle spasms.   I have changed [name]'s tiZANidine. I am also having him start on gabapentin. Additionally, I am having him maintain his Insulin Glargine, EPINEPHrine, atorvastatin, insulin aspart, aspirin EC, escitalopram, and famotidine.  Meds ordered this encounter  Medications  . gabapentin (NEURONTIN) 300 MG capsule    Sig: Take 1 capsule (300 mg total) by mouth at bedtime. Take for 7 days then up to 1 capsule twice daily afterwards.    Dispense:  60 capsule    Refill:  3    Order Specific Question:  Supervising Provider    Answer:  Duncan Dull L [[id number]]  . tiZANidine (ZANAFLEX) 4 MG tablet     Sig: Take 1 tablet (4 mg total) by mouth every 6 (six) hours as needed for muscle spasms.    Dispense:  120 tablet    Refill:  0    Order Specific Question:  Supervising Provider    Answer:  Sherlene Shams [[id number]]     Follow-up: Return in about 2 weeks (around 08/26/2015) for DM and neuropathy.

## 2015-08-12 NOTE — Progress Notes (Signed)
Pre visit review using our clinic review tool, if applicable. No additional management support is needed unless otherwise documented below in the visit note. 

## 2015-08-14 ENCOUNTER — Telehealth: Payer: Self-pay

## 2015-08-14 NOTE — Telephone Encounter (Signed)
Spoke with pt and he is willing to do this, but is concerned about the price and is going to call the pharmacy and find out how much it is and call us tomorrow and let us know for sure.

## 2015-08-14 NOTE — Assessment & Plan Note (Signed)
Patient still non-compliant with medical treatment. Pt reports that NovoLog is too expensive. He is using Lantus 50 units twice daily we'll try to switch him to twice daily with meals Novolin 70/30. Patient canceled appointment with Dr. Elvera LennoxGherghe and did not set up another one. [facility] Information was given to patient and he was told that this is the best way for him to have care and resources.

## 2015-08-14 NOTE — Telephone Encounter (Signed)
-----   Message from Carollee Leitzarrie M Doss, NP sent at 08/14/2015  8:03 AM EDT ----- Please let pt know that we can take him off Lantus and place him on 15 units of Novolin 70/30 twice daily with meals and titrate him upward each few days. If he is willing to do so and work with us while waiting on his endocrinology appointment I will be happy to help. If he is unwilling to comply he will have to wait.

## 2015-08-17 ENCOUNTER — Encounter: Payer: Self-pay | Admitting: Nurse Practitioner

## 2015-08-17 NOTE — Assessment & Plan Note (Signed)
Discussed gabapentin trial. Instructions were verbal and written. Pt non-compliant with follow up and cancels appointments. Discussed it is up to him to help control his blood sugar, which in turn helps with neuropathy and wound healing if he should need surgery in the future. He does not want imaging due to financial matters at this time.

## 2015-08-17 NOTE — Assessment & Plan Note (Signed)
Pt never re-scheduled appointment from canceling his endocrinology appointment. Pt is non-compliant with treatment and I am unsure where this is coming from. His wife is very supportive and seems to be knowledgeable. FU in 2 weeks. Will get him set up with Dr. Elvera LennoxGherghe for further help with his diabetes with hyperglycemia. Will try to get him to find out what is cheaper than novolog.

## 2015-08-26 ENCOUNTER — Ambulatory Visit: Payer: Commercial Managed Care - HMO | Admitting: Nurse Practitioner

## 2015-08-26 DIAGNOSIS — Z0289 Encounter for other administrative examinations: Secondary | ICD-10-CM

## 2015-09-01 ENCOUNTER — Ambulatory Visit: Payer: Commercial Managed Care - HMO | Admitting: Internal Medicine

## 2015-09-01 DIAGNOSIS — Z0289 Encounter for other administrative examinations: Secondary | ICD-10-CM

## 2015-09-24 IMAGING — CR DG CHEST 2V
2 series · 2 of 2 positions shown · non-contrast
Comparison: 03/18/2015 .

CLINICAL DATA: CABG.

EXAM:
CHEST  2 VIEW

[chest pa]
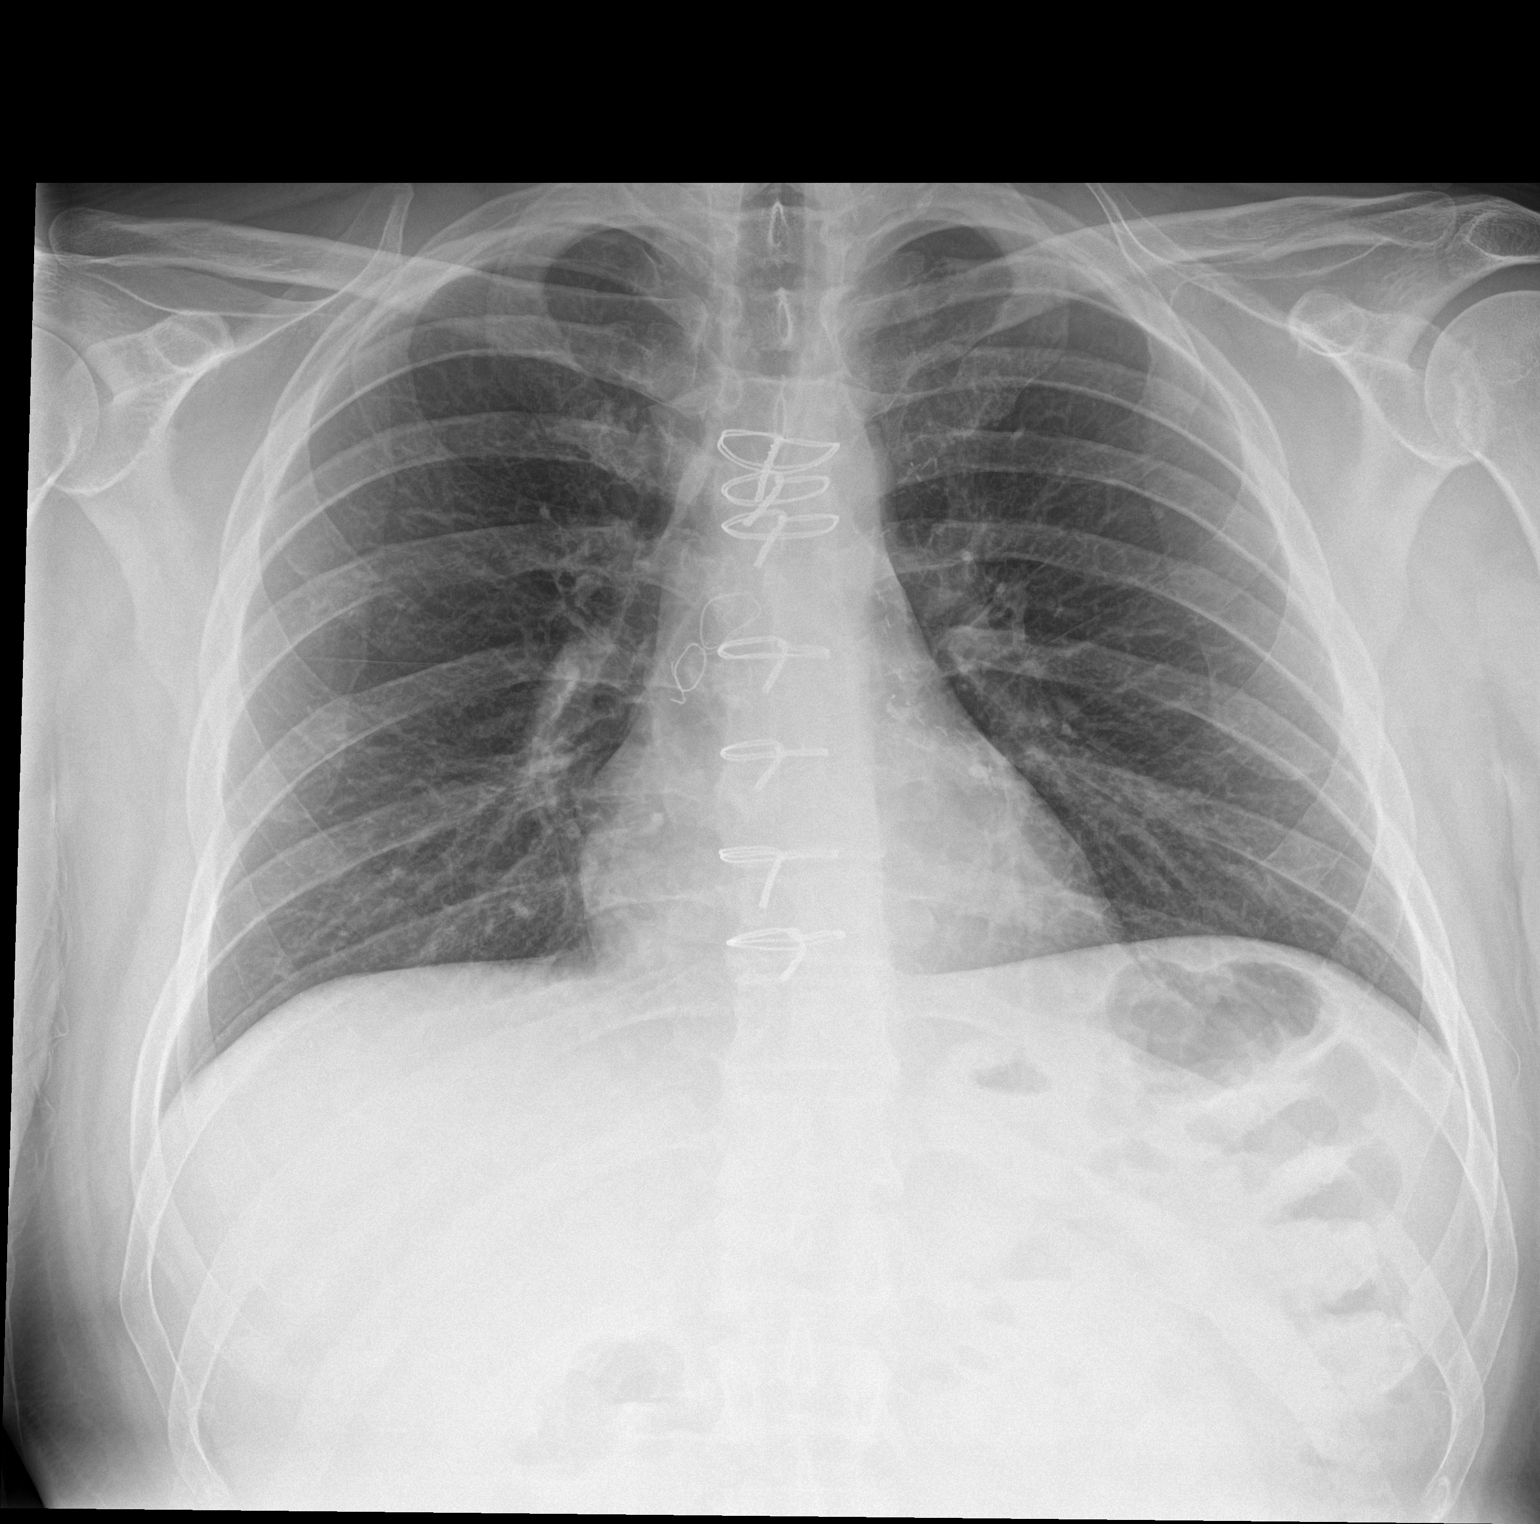

[chest lat]
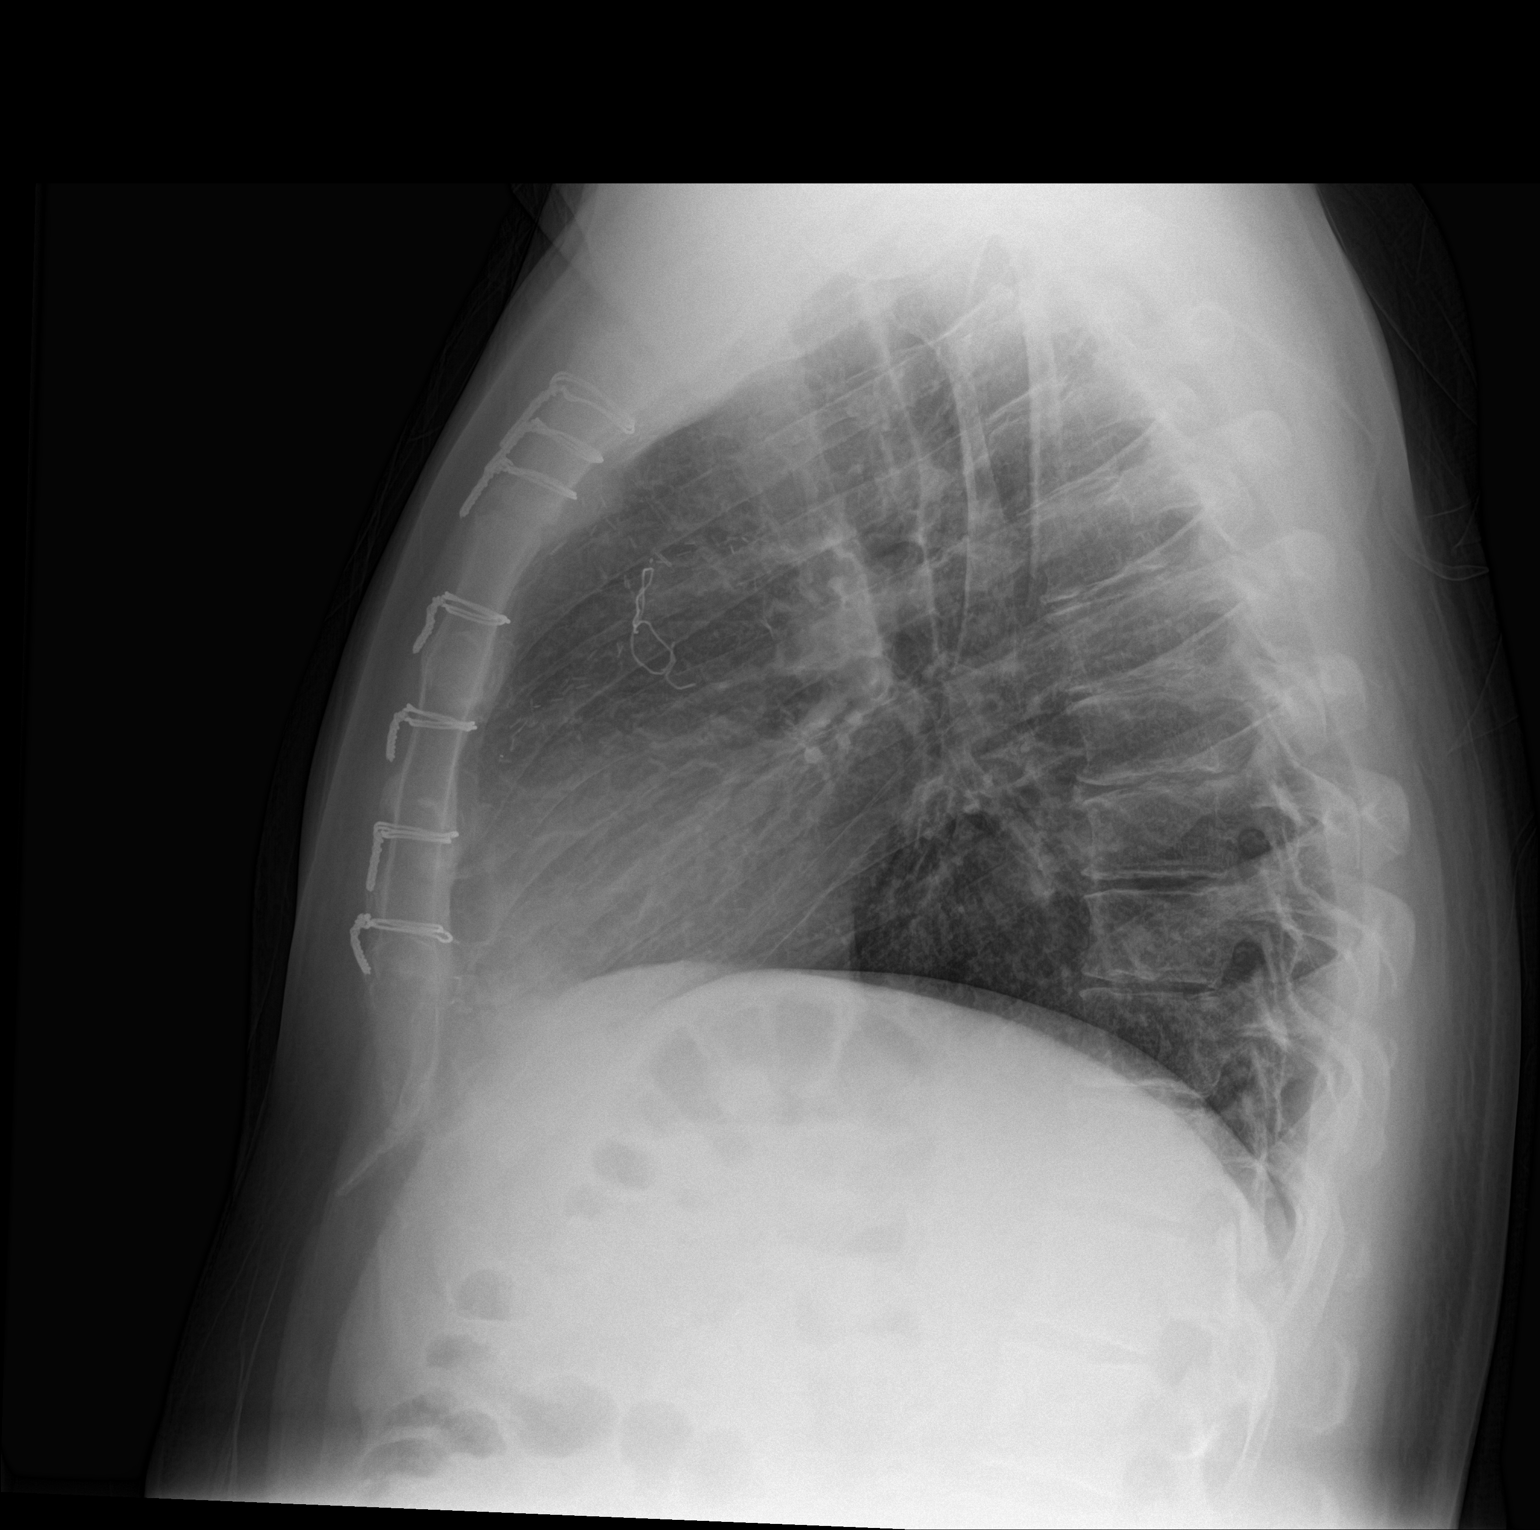

[2 of 2 positions shown; findings below may reference images not displayed]

FINDINGS: Mediastinum hilar structures normal. CABG. No acute cardiopulmonary
disease identified. No pleural effusion or pneumothorax.
IMPRESSION: 1. Prior CABG.
2. No acute cardiopulmonary disease. Chest is stable since
03/18/2015.

## 2015-09-28 ENCOUNTER — Emergency Department: Payer: Commercial Managed Care - HMO

## 2015-09-28 ENCOUNTER — Encounter: Payer: Self-pay | Admitting: Emergency Medicine

## 2015-09-28 ENCOUNTER — Observation Stay
Admission: EM | Admit: 2015-09-28 | Discharge: 2015-09-29 | Disposition: A | Discharge status: Home / Self Care | Payer: Commercial Managed Care - HMO | Attending: Internal Medicine | Admitting: Internal Medicine

## 2015-09-28 DIAGNOSIS — Z87891 Personal history of nicotine dependence: Secondary | ICD-10-CM | POA: Diagnosis not present

## 2015-09-28 DIAGNOSIS — R531 Weakness: Secondary | ICD-10-CM

## 2015-09-28 DIAGNOSIS — E669 Obesity, unspecified: Secondary | ICD-10-CM | POA: Insufficient documentation

## 2015-09-28 DIAGNOSIS — Z794 Long term (current) use of insulin: Secondary | ICD-10-CM | POA: Insufficient documentation

## 2015-09-28 DIAGNOSIS — F4322 Adjustment disorder with anxiety: Secondary | ICD-10-CM | POA: Insufficient documentation

## 2015-09-28 DIAGNOSIS — R51 Headache: Secondary | ICD-10-CM | POA: Diagnosis not present

## 2015-09-28 DIAGNOSIS — Z833 Family history of diabetes mellitus: Secondary | ICD-10-CM | POA: Diagnosis not present

## 2015-09-28 DIAGNOSIS — Z951 Presence of aortocoronary bypass graft: Secondary | ICD-10-CM | POA: Insufficient documentation

## 2015-09-28 DIAGNOSIS — Z8249 Family history of ischemic heart disease and other diseases of the circulatory system: Secondary | ICD-10-CM | POA: Insufficient documentation

## 2015-09-28 DIAGNOSIS — E785 Hyperlipidemia, unspecified: Secondary | ICD-10-CM | POA: Diagnosis not present

## 2015-09-28 DIAGNOSIS — M5412 Radiculopathy, cervical region: Secondary | ICD-10-CM | POA: Insufficient documentation

## 2015-09-28 DIAGNOSIS — I252 Old myocardial infarction: Secondary | ICD-10-CM | POA: Insufficient documentation

## 2015-09-28 DIAGNOSIS — Z79899 Other long term (current) drug therapy: Secondary | ICD-10-CM | POA: Diagnosis not present

## 2015-09-28 DIAGNOSIS — M25512 Pain in left shoulder: Secondary | ICD-10-CM | POA: Diagnosis not present

## 2015-09-28 DIAGNOSIS — N410 Acute prostatitis: Secondary | ICD-10-CM | POA: Insufficient documentation

## 2015-09-28 DIAGNOSIS — I493 Ventricular premature depolarization: Secondary | ICD-10-CM | POA: Diagnosis not present

## 2015-09-28 DIAGNOSIS — I25810 Atherosclerosis of coronary artery bypass graft(s) without angina pectoris: Secondary | ICD-10-CM | POA: Insufficient documentation

## 2015-09-28 DIAGNOSIS — I10 Essential (primary) hypertension: Secondary | ICD-10-CM | POA: Insufficient documentation

## 2015-09-28 DIAGNOSIS — Z8673 Personal history of transient ischemic attack (TIA), and cerebral infarction without residual deficits: Secondary | ICD-10-CM | POA: Insufficient documentation

## 2015-09-28 DIAGNOSIS — E1165 Type 2 diabetes mellitus with hyperglycemia: Secondary | ICD-10-CM | POA: Diagnosis not present

## 2015-09-28 DIAGNOSIS — Z825 Family history of asthma and other chronic lower respiratory diseases: Secondary | ICD-10-CM | POA: Diagnosis not present

## 2015-09-28 DIAGNOSIS — Z888 Allergy status to other drugs, medicaments and biological substances status: Secondary | ICD-10-CM | POA: Insufficient documentation

## 2015-09-28 DIAGNOSIS — R109 Unspecified abdominal pain: Secondary | ICD-10-CM | POA: Diagnosis not present

## 2015-09-28 DIAGNOSIS — R55 Syncope and collapse: Secondary | ICD-10-CM | POA: Diagnosis not present

## 2015-09-28 DIAGNOSIS — M6289 Other specified disorders of muscle: Secondary | ICD-10-CM | POA: Diagnosis present

## 2015-09-28 DIAGNOSIS — R61 Generalized hyperhidrosis: Secondary | ICD-10-CM | POA: Insufficient documentation

## 2015-09-28 DIAGNOSIS — I4892 Unspecified atrial flutter: Secondary | ICD-10-CM | POA: Diagnosis not present

## 2015-09-28 DIAGNOSIS — R42 Dizziness and giddiness: Secondary | ICD-10-CM | POA: Diagnosis not present

## 2015-09-28 DIAGNOSIS — H3581 Retinal edema: Secondary | ICD-10-CM | POA: Insufficient documentation

## 2015-09-28 DIAGNOSIS — Z6832 Body mass index (BMI) 32.0-32.9, adult: Secondary | ICD-10-CM | POA: Diagnosis not present

## 2015-09-28 DIAGNOSIS — Z7982 Long term (current) use of aspirin: Secondary | ICD-10-CM | POA: Insufficient documentation

## 2015-09-28 DIAGNOSIS — Z801 Family history of malignant neoplasm of trachea, bronchus and lung: Secondary | ICD-10-CM | POA: Insufficient documentation

## 2015-09-28 DIAGNOSIS — Z9114 Patient's other noncompliance with medication regimen: Secondary | ICD-10-CM | POA: Insufficient documentation

## 2015-09-28 DIAGNOSIS — R079 Chest pain, unspecified: Secondary | ICD-10-CM | POA: Diagnosis present

## 2015-09-28 DIAGNOSIS — R0789 Other chest pain: Principal | ICD-10-CM | POA: Insufficient documentation

## 2015-09-28 DIAGNOSIS — R202 Paresthesia of skin: Secondary | ICD-10-CM | POA: Diagnosis present

## 2015-09-28 LAB — CBC
HEMATOCRIT: 46.6 % (ref 40.0–52.0)
Hemoglobin: 15.6 g/dL (ref 13.0–18.0)
MCH: 28.5 pg (ref 26.0–34.0)
MCHC: 33.5 g/dL (ref 32.0–36.0)
MCV: 85 fL (ref 80.0–100.0)
PLATELETS: 263 10*3/uL (ref 150–440)
RBC: 5.48 MIL/uL (ref 4.40–5.90)
RDW: 12.2 % (ref 11.5–14.5)
WBC: 6.1 10*3/uL (ref 3.8–10.6)

## 2015-09-28 LAB — TROPONIN I: Troponin I: <0.03 ng/mL (ref <0.031)

## 2015-09-28 LAB — BASIC METABOLIC PANEL
Anion gap: 8 (ref 5–15)
BUN: 14 mg/dL (ref 6–20)
CALCIUM: 9 mg/dL (ref 8.9–10.3)
CO2: 25 mmol/L (ref 22–32)
CREATININE: 0.82 mg/dL (ref 0.61–1.24)
Chloride: 101 mmol/L (ref 101–111)
GFR calc Af Amer: >60 mL/min (ref >60)
GLUCOSE: 356 mg/dL — AB (ref 65–99)
POTASSIUM: 4.4 mmol/L (ref 3.5–5.1)
SODIUM: 134 mmol/L — AB (ref 135–145)

## 2015-09-28 LAB — GLUCOSE, CAPILLARY: GLUCOSE-CAPILLARY: 245 mg/dL — AB (ref 65–99)

## 2015-09-28 LAB — TSH: TSH: 2.165 u[IU]/mL (ref 0.350–4.500)

## 2015-09-28 MED ORDER — INSULIN GLARGINE 100 UNIT/ML ~~LOC~~ SOLN
20.0000 [IU] | Freq: Every day | SUBCUTANEOUS | Status: DC
  Filled 2015-09-28: qty 0.2

## 2015-09-28 MED ORDER — ASPIRIN EC 325 MG PO TBEC
325.0000 mg | DELAYED_RELEASE_TABLET | Freq: Every day | ORAL | Status: DC
  Administered 2015-09-29: 325 mg via ORAL
  Filled 2015-09-28: qty 1

## 2015-09-28 MED ORDER — GABAPENTIN 300 MG PO CAPS
300.0000 mg | ORAL_CAPSULE | Freq: Every day | ORAL | Status: DC
  Administered 2015-09-28: 300 mg via ORAL
  Filled 2015-09-28: qty 1

## 2015-09-28 MED ORDER — INSULIN GLARGINE 100 UNIT/ML ~~LOC~~ SOLN
60.0000 [IU] | Freq: Every day | SUBCUTANEOUS | Status: DC
  Administered 2015-09-28: 60 [IU] via SUBCUTANEOUS
  Filled 2015-09-28 (×2): qty 0.6

## 2015-09-28 MED ORDER — ATORVASTATIN CALCIUM 20 MG PO TABS
80.0000 mg | ORAL_TABLET | Freq: Every day | ORAL | Status: DC
  Administered 2015-09-28 – 2015-09-29 (×2): 80 mg via ORAL
  Filled 2015-09-28 (×2): qty 4

## 2015-09-28 MED ORDER — ACETAMINOPHEN 325 MG PO TABS: 650.0000 mg | ORAL_TABLET | Freq: Four times a day (QID) | ORAL | Status: DC | PRN

## 2015-09-28 MED ORDER — TIZANIDINE HCL 4 MG PO TABS: 4.0000 mg | ORAL_TABLET | Freq: Four times a day (QID) | ORAL | Status: DC | PRN

## 2015-09-28 MED ORDER — ESCITALOPRAM OXALATE 10 MG PO TABS
10.0000 mg | ORAL_TABLET | Freq: Every day | ORAL | Status: DC
  Administered 2015-09-28 – 2015-09-29 (×2): 10 mg via ORAL
  Filled 2015-09-28 (×2): qty 1

## 2015-09-28 MED ORDER — INSULIN GLARGINE 100 UNIT/ML ~~LOC~~ SOLN
30.0000 [IU] | Freq: Every day | SUBCUTANEOUS | Status: DC
  Administered 2015-09-29: 30 [IU] via SUBCUTANEOUS
  Filled 2015-09-28: qty 0.3

## 2015-09-28 MED ORDER — SODIUM CHLORIDE 0.9 % IV BOLUS (SEPSIS)
1000.0000 mL | Freq: Once | INTRAVENOUS | Status: AC
  Administered 2015-09-28: 1000 mL via INTRAVENOUS

## 2015-09-28 MED ORDER — ONDANSETRON HCL 4 MG/2ML IJ SOLN: 4.0000 mg | Freq: Four times a day (QID) | INTRAMUSCULAR | Status: DC | PRN

## 2015-09-28 MED ORDER — ONDANSETRON HCL 4 MG PO TABS: 4.0000 mg | ORAL_TABLET | Freq: Four times a day (QID) | ORAL | Status: DC | PRN

## 2015-09-28 MED ORDER — ENOXAPARIN SODIUM 40 MG/0.4ML ~~LOC~~ SOLN
40.0000 mg | SUBCUTANEOUS | Status: DC
  Administered 2015-09-28: 40 mg via SUBCUTANEOUS
  Filled 2015-09-28: qty 0.4

## 2015-09-28 MED ORDER — SODIUM CHLORIDE 0.9 % IV SOLN: 250.0000 mL | INTRAVENOUS | Status: DC | PRN

## 2015-09-28 MED ORDER — SODIUM CHLORIDE 0.9 % IJ SOLN
3.0000 mL | Freq: Two times a day (BID) | INTRAMUSCULAR | Status: DC
  Administered 2015-09-28 – 2015-09-29 (×2): 3 mL via INTRAVENOUS

## 2015-09-28 MED ORDER — SODIUM CHLORIDE 0.9 % IJ SOLN: 3.0000 mL | INTRAMUSCULAR | Status: DC | PRN

## 2015-09-28 MED ORDER — FAMOTIDINE 20 MG PO TABS
20.0000 mg | ORAL_TABLET | Freq: Every day | ORAL | Status: DC
  Administered 2015-09-28 – 2015-09-29 (×2): 20 mg via ORAL
  Filled 2015-09-28 (×2): qty 1

## 2015-09-28 MED ORDER — SODIUM CHLORIDE 0.9 % IJ SOLN
3.0000 mL | Freq: Two times a day (BID) | INTRAMUSCULAR | Status: DC
  Administered 2015-09-29: 3 mL via INTRAVENOUS

## 2015-09-28 MED ORDER — ASPIRIN 81 MG PO CHEW
324.0000 mg | CHEWABLE_TABLET | Freq: Once | ORAL | Status: AC
  Administered 2015-09-28: 324 mg via ORAL
  Filled 2015-09-28: qty 4

## 2015-09-28 MED ORDER — ACETAMINOPHEN 650 MG RE SUPP: 650.0000 mg | Freq: Four times a day (QID) | RECTAL | Status: DC | PRN

## 2015-09-28 NOTE — Care Management Obs Status (Signed)
MEDICARE OBSERVATION STATUS NOTIFICATION   Patient Details  Name: [name] MRN: [id number] Date of Birth: 07/30/1981   Medicare Observation Status Notification Given:  Yes    Caren MacadamMichelle Donyell Ding, RN 09/28/2015, 6:20 PM

## 2015-09-28 NOTE — H&P (Signed)
[facility] [place] at [facility]   PATIENT NAME: [name]    MR#:  [id number]  DATE OF BIRTH:  1981/03/06  DATE OF ADMISSION:  09/28/2015  PRIMARY CARE PHYSICIAN: Carollee Leitz, NP   REQUESTING/REFERRING PHYSICIAN: Janalyn Harder M.D.  CHIEF COMPLAINT:   Chief Complaint  Patient presents with  . Chest Pain    HISTORY OF PRESENT ILLNESS: [name]  is a 34 y.o. male with a known history of  coronary artery disease status post CABG, hypertension, , diabetes type 2 presents with complaint of chest pressure since this morning. Patient reports that he was watching TV and developed these symptoms. He also has been having some paresthesia. Patient reports that the chest pressure lasted for few hours now mostly resolved. Denies any nausea vomiting or diarrhea  PAST MEDICAL HISTORY:   Past Medical History  Diagnosis Date  . Coronary atherosclerosis of artery bypass graft     Age 34 - [place]  . Chest pain, unspecified   . Uncontrolled diabetes mellitus with complications (HCC) 2002    poorly controlled  . HLD (hyperlipidemia)     poorly controlled  . HTN (hypertension)   . Obesity, unspecified   . Hx-TIA (transient ischemic attack) 2009    possible-([facility]) Normal MRI of brain and MRA of the head and neck  . Noncompliance     PAST SURGICAL HISTORY:  Past Surgical History  Procedure Laterality Date  . Coronary artery bypass graft  2007    x 4 (age 34)    SOCIAL HISTORY:  Social History  Substance Use Topics  . Smoking status: Former Smoker    Quit date: 10/11/2004  . Smokeless tobacco: Former Neurosurgeon     Comment: used to smoke 1 1/2 ppd  . Alcohol Use: No     Comment: 1-2 drinks per month    FAMILY HISTORY:  Family History  Problem Relation Age of Onset  . Emphysema Father     + smoker  . COPD Father     Died age 19  . Coronary artery disease Maternal Grandfather   . Heart attack Maternal Grandfather   . Diabetes Maternal  Grandfather   . Hypertension Maternal Grandfather   . Hyperlipidemia Maternal Grandfather   . Heart disease Maternal Grandfather     CAD  . Lung cancer      paternal uncles and aunts (all smokers)  . Coronary artery disease      premature-family history    DRUG ALLERGIES:  Allergies  Allergen Reactions  . Nitroglycerin Nausea And Vomiting    REVIEW OF SYSTEMS:   CONSTITUTIONAL: No fever, fatigue or weakness.  EYES: No blurred or double vision.  EARS, NOSE, AND THROAT: No tinnitus or ear pain.  RESPIRATORY: No cough, shortness of breath, wheezing or hemoptysis.  CARDIOVASCULAR: Positive chest pain, orthopnea, edema.  GASTROINTESTINAL: No nausea, vomiting, diarrhea or abdominal pain.  GENITOURINARY: No dysuria, hematuria.  ENDOCRINE: No polyuria, nocturia,  HEMATOLOGY: No anemia, easy bruising or bleeding SKIN: No rash or lesion. MUSCULOSKELETAL: No joint pain or arthritis.   NEUROLOGIC: No tingling, numbness, weakness.  PSYCHIATRY: No anxiety or depression.   MEDICATIONS AT HOME:  Prior to Admission medications   Medication Sig Start Date End Date Taking? Authorizing Provider  aspirin EC 81 MG tablet Take 81 mg by mouth daily.   Yes Historical Provider, MD  atorvastatin (LIPITOR) 80 MG tablet Take 1 tablet (80 mg total) by mouth daily. Patient taking differently: Take  80 mg by mouth every morning.  05/07/15  Yes Antonieta Iba, MD  Insulin Glargine (LANTUS SOLOSTAR) 100 UNIT/ML Solostar Pen Inject 15 Units into the skin daily at 10 pm. Patient taking differently: Inject 30-60 Units into the skin 2 (two) times daily. Inject 30 units subcutaneous every morning. Inject 60 units subcutaneous every night at bedtime. Per sliding scale. 12/25/14  Yes Radhika P Phadke, MD  EPINEPHrine (EPIPEN 2-PAK) 0.3 mg/0.3 mL IJ SOAJ injection Inject 0.3 mLs (0.3 mg total) into the muscle once. Patient not taking: Reported on 06/26/2015 04/24/15   Darci Current, MD  escitalopram (LEXAPRO) 10 MG  tablet Take 1 tablet (10 mg total) by mouth daily. 06/02/15   Carollee Leitz, NP  famotidine (PEPCID) 20 MG tablet Take 1 tablet (20 mg total) by mouth daily. 06/26/15 06/25/16  Jeanmarie Plant, MD  gabapentin (NEURONTIN) 300 MG capsule Take 1 capsule (300 mg total) by mouth at bedtime. Take for 7 days then up to 1 capsule twice daily afterwards. 08/12/15   Carollee Leitz, NP  insulin aspart (NOVOLOG) 100 UNIT/ML injection Use on a sliding scale between 5-15 units with meals. Max daily 45 units Patient not taking: Reported on 06/26/2015 05/09/15   Carollee Leitz, NP  tiZANidine (ZANAFLEX) 4 MG tablet Take 1 tablet (4 mg total) by mouth every 6 (six) hours as needed for muscle spasms. 08/12/15   Carollee Leitz, NP      PHYSICAL EXAMINATION:   VITAL SIGNS: Blood pressure 137/92, pulse 84, temperature 98.4 F (36.9 C), temperature source Oral, resp. rate 22, height 6' (1.829 m), weight 108.863 kg (240 lb), SpO2 98 %.  GENERAL:  34 y.o.-year-old patient lying lying in the bed with no acute distress.  EYES: Pupils equal, round, reactive to light and accommodation. No scleral icterus. Extraocular muscles intact.  HEENT: Head atraumatic, normocephalic. Oropharynx and nasopharynx clear.  NECK:  Supple, no jugular venous distention. No thyroid enlargement, no tenderness.  LUNGS: Normal breath sounds bilaterally, no wheezing, rales,rhonchi or crepitation. No use of accessory muscles of respiration.  CARDIOVASCULAR: S1, S2 normal. No murmurs, rubs, or gallops.  ABDOMEN: Soft, nontender, nondistended. Bowel sounds present. No organomegaly or mass.  EXTREMITIES: No pedal edema, cyanosis, or clubbing.  NEUROLOGIC: Cranial nerves II through XII are intact. Muscle strength 5/5 in all extremities. Sensation intact. Gait not checked.  PSYCHIATRIC: The patient is alert and oriented x 3.  SKIN: No obvious rash, lesion, or ulcer.   LABORATORY PANEL:   CBC  Recent Labs Lab 09/28/15 1244  WBC 6.1  HGB 15.6  HCT 46.6   PLT 263  MCV 85.0  MCH 28.5  MCHC 33.5  RDW 12.2   ------------------------------------------------------------------------------------------------------------------  Chemistries   Recent Labs Lab 09/28/15 1244  NA 134*  K 4.4  CL 101  CO2 25  GLUCOSE 356*  BUN 14  CREATININE 0.82  CALCIUM 9.0   ------------------------------------------------------------------------------------------------------------------ estimated creatinine clearance is 160.2 mL/min (by C-G formula based on Cr of 0.82). ------------------------------------------------------------------------------------------------------------------ No results for input(s): TSH, T4TOTAL, T3FREE, THYROIDAB in the last 72 hours.  Invalid input(s): FREET3   Coagulation profile No results for input(s): INR, PROTIME in the last 168 hours. ------------------------------------------------------------------------------------------------------------------- No results for input(s): DDIMER in the last 72 hours. -------------------------------------------------------------------------------------------------------------------  Cardiac Enzymes  Recent Labs Lab 09/28/15 1244  TROPONINI <0.03   ------------------------------------------------------------------------------------------------------------------ Invalid input(s): POCBNP  ---------------------------------------------------------------------------------------------------------------  Urinalysis    Component Value Date/Time   COLORURINE STRAW* 05/30/2015 0952   COLORURINE Straw 07/16/2014 0551  APPEARANCEUR CLEAR* 05/30/2015 0952   APPEARANCEUR Clear 07/16/2014 0551   LABSPEC 1.031* 05/30/2015 0952   LABSPEC 1.026 07/16/2014 0551   PHURINE 5.0 05/30/2015 0952   PHURINE 6.0 07/16/2014 0551   GLUCOSEU >500* 05/30/2015 0952   GLUCOSEU >=500 07/16/2014 0551   HGBUR 1+* 05/30/2015 0952   HGBUR Negative 07/16/2014 0551   BILIRUBINUR NEGATIVE 05/30/2015 0952    BILIRUBINUR neg 03/24/2015 0918   BILIRUBINUR Negative 07/16/2014 0551   KETONESUR NEGATIVE 05/30/2015 0952   KETONESUR Negative 07/16/2014 0551   PROTEINUR NEGATIVE 05/30/2015 0952   PROTEINUR neg 03/24/2015 0918   PROTEINUR Negative 07/16/2014 0551   UROBILINOGEN 0.2 03/24/2015 0918   NITRITE NEGATIVE 05/30/2015 0952   NITRITE neg 03/24/2015 0918   NITRITE Negative 07/16/2014 0551   LEUKOCYTESUR NEGATIVE 05/30/2015 0952   LEUKOCYTESUR Negative 07/16/2014 0551     RADIOLOGY: Dg Chest 2 View  09/28/2015  CLINICAL DATA:  Chest pain and dizziness EXAM: CHEST - 2 VIEW COMPARISON:  06/26/2015 FINDINGS: The heart size and mediastinal contours are within normal limits. Both lungs are clear. The visualized skeletal structures are unremarkable. Postsurgical changes are again seen. IMPRESSION: No active disease. Electronically Signed   By: Alcide CleverMark  Lukens M.D.   On: 09/28/2015 13:39   Ct Head Wo Contrast  09/28/2015  CLINICAL DATA:  Chest pain, weakness and dizziness. Pale and diaphoretic. EXAM: CT HEAD WITHOUT CONTRAST TECHNIQUE: Contiguous axial images were obtained from the base of the skull through the vertex without intravenous contrast. COMPARISON:  Head CT dated 02/15/2012. FINDINGS: Ventricles are normal in size and configuration. All areas of the brain demonstrate normal gray-white matter attenuation. There is no mass, hemorrhage, edema or other evidence of acute parenchymal abnormality. No extra-axial hemorrhage. No osseous abnormality appreciated. Visualized upper paranasal sinuses are clear. Mastoid air cells are clear. Superficial soft tissues are unremarkable. IMPRESSION: No acute findings.  No intracranial mass, hemorrhage or edema. Electronically Signed   By: Bary RichardStan  Maynard M.D.   On: 09/28/2015 15:08    EKG: Orders placed or performed during the hospital encounter of 09/28/15  . EKG 12-Lead  . EKG 12-Lead  . ED EKG within 10 minutes  . ED EKG within 10 minutes    IMPRESSION  AND PLAN:  Patient is a 34 year old white male with history of coronary artery disease comes in with chest pressure paresthesias  1. Chest pain patient's troponin is negative EKG nonrevealing. At this time will place him under obscure cardiology consult continue aspirin.  2. Hyperlipidemia continue atorvastatin  3. Diabetes type 2 patient's Lantus dose is very unusual he states that he takes 30 units in the morning and 60 units in the evening time.  4. Hyperlipidemia continue atorvastatin  5. Paresthesias I would check a vitamin B12 level.  All the records are reviewed and case discussed with ED provider. Management plans discussed with the patient, family and they are in agreement.  CODE STATUS: Full    TOTAL TIME TAKING CARE OF THIS PATIENT: 45 minutes minutes.    Auburn BilberryPATEL, Elson Ulbrich M.D on 09/28/2015 at 4:10 PM  Between 7am to 6pm - Pager - [phone]  After 6pm go to [email or web address] - password [email or web address] [facility]  [facility]  Office  [phone]  CC: Primary care physician; Carollee Leitzoss, Carrie M, NP

## 2015-09-28 NOTE — ED Provider Notes (Addendum)
[facility] Emergency Department Provider Note  ____________________________________________  Time seen: 1338  I have reviewed the triage vital signs and the nursing notes.  History by:   Patient along with wife  HISTORY  Chief Complaint Chest Pain tightness in chest vertigo Weakness Paresthesia    HPI [name] is a 34 y.o. 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 He also reports that he has had a TIA in the past. He presents today complaining of tightness in his chest that began around 10 AM. He also reports having numbness all over, including both hands and feet and legs. He feels weak in general. He denies any headache. He feels like his breathing is labored.  Patient takes an aspirin a day, though he said he forgets. He is not on any blood thinner.   Past Medical History  Diagnosis Date  . Coronary atherosclerosis of artery bypass graft     Age 34 - [place]  . Chest pain, unspecified   . Uncontrolled diabetes mellitus with complications (HCC) 2002    poorly controlled  . HLD (hyperlipidemia)     poorly controlled  . HTN (hypertension)   . Obesity, unspecified   . Hx-TIA (transient ischemic attack) 2009    possible-([facility]) Normal MRI of brain and MRA of the head and neck  . Noncompliance     Patient Active Problem List   Diagnosis Date Noted  . Non compliance with medical treatment 03/24/2015  . Left flank pain 03/24/2015  . Cervical radiculitis 09/04/2014  . Acute prostatitis 07/20/2014  . Diabetes mellitus type 2 with complications, uncontrolled (HCC) 02/13/2014  . Macular edema 02/13/2014  . HLD (hyperlipidemia) 11/08/2013  . Left shoulder pain 11/08/2013  . Financial difficulties 11/08/2013  . Depression with anxiety 11/08/2013  . Adjustment disorder with anxiety 10/26/2012  . Headache(784.0) 05/04/2011  . OBESITY 12/22/2010  . Atypical chest pain  03/05/2009  . Hyperlipidemia 03/03/2009  . Essential hypertension 03/03/2009  . CAD, ARTERY BYPASS GRAFT 03/03/2009    Past Surgical History  Procedure Laterality Date  . Coronary artery bypass graft  2007    x 4 (age 34)    Current Outpatient Rx  Name  Route  Sig  Dispense  Refill  . aspirin EC 81 MG tablet   Oral   Take 81 mg by mouth daily.         Marland Kitchen. atorvastatin (LIPITOR) 80 MG tablet   Oral   Take 1 tablet (80 mg total) by mouth daily. Patient taking differently: Take 80 mg by mouth every morning.    90 tablet   3   . EPINEPHrine (EPIPEN 2-PAK) 0.3 mg/0.3 mL IJ SOAJ injection   Intramuscular   Inject 0.3 mLs (0.3 mg total) into the muscle once. Patient not taking: Reported on 06/26/2015   1 Device   0   . escitalopram (LEXAPRO) 10 MG tablet   Oral   Take 1 tablet (10 mg total) by mouth daily.   90 tablet   0   . famotidine (PEPCID) 20 MG tablet   Oral   Take 1 tablet (20 mg total) by mouth daily.   30 tablet   1   . gabapentin (NEURONTIN) 300 MG capsule   Oral   Take 1 capsule (300 mg total) by mouth at bedtime. Take for 7 days then up to 1 capsule twice daily afterwards.  60 capsule   3   . insulin aspart (NOVOLOG) 100 UNIT/ML injection      Use on a sliding scale between 5-15 units with meals. Max daily 45 units Patient not taking: Reported on 06/26/2015   3 vial   PRN   . Insulin Glargine (LANTUS SOLOSTAR) 100 UNIT/ML Solostar Pen   Subcutaneous   Inject 15 Units into the skin daily at 10 pm. Patient taking differently: Inject 50 Units into the skin 2 (two) times daily.    5 pen   3   . tiZANidine (ZANAFLEX) 4 MG tablet   Oral   Take 1 tablet (4 mg total) by mouth every 6 (six) hours as needed for muscle spasms.   120 tablet   0     Allergies Nitroglycerin  Family History  Problem Relation Age of Onset  . Emphysema Father     + smoker  . COPD Father     Died age 21  . Coronary artery disease Maternal Grandfather   . Heart  attack Maternal Grandfather   . Diabetes Maternal Grandfather   . Hypertension Maternal Grandfather   . Hyperlipidemia Maternal Grandfather   . Heart disease Maternal Grandfather     CAD  . Lung cancer      paternal uncles and aunts (all smokers)  . Coronary artery disease      premature-family history    Social History Social History  Substance Use Topics  . Smoking status: Former Smoker    Quit date: 10/11/2004  . Smokeless tobacco: Former Neurosurgeon     Comment: used to smoke 1 1/2 ppd  . Alcohol Use: No     Comment: 1-2 drinks per month    Review of Systems  Constitutional: Negative for fever/chills. ENT: Negative for congestion. Cardiovascular: positive for chest tightness and chest discomfort. Respiratory: patient reports "labored breathing" Gastrointestinal: Negative for abdominal pain, vomiting and diarrhea. Genitourinary: Negative for dysuria. Musculoskeletal: No back pain. Skin: Negative for rash. Neurological: numbness bilateral arms and legs.  10-point ROS otherwise negative.  ____________________________________________   PHYSICAL EXAM:  VITAL SIGNS: ED Triage Vitals  Enc Vitals Group     BP 09/28/15 1237 125/91 mmHg     Pulse Rate 09/28/15 1237 96     Resp 09/28/15 1237 16     Temp 09/28/15 1237 98.4 F (36.9 C)     Temp Source 09/28/15 1237 Oral     SpO2 09/28/15 1237 96 %     Weight 09/28/15 1237 240 lb (108.863 kg)     Height 09/28/15 1237 6' (1.829 m)     Head Cir --      Peak Flow --      Pain Score 09/28/15 1243 10     Pain Loc --      Pain Edu? --      Excl. in GC? --     Constitutional: Alert and oriented. He appears to be speaking slowly and deliberately. He appears to be in no acute distress with normal restaurant effort. ENT   Head: Normocephalic and atraumatic.   Nose: No congestion/rhinnorhea.       Mouth: No erythema, no swelling   Cardiovascular: Normal rate, regular rhythm, no murmur noted Respiratory:  Normal  respiratory effort, no tachypnea.    Breath sounds are clear and equal bilaterally.  Gastrointestinal: Soft, no distention. Nontender Back: No muscle spasm, no tenderness, no CVA tenderness. Musculoskeletal: No deformity noted. Nontender with normal range of motion in all extremities.  No noted edema. Neurologic:  Communicative. Grip strength on the right appears less than the left. Sensation in the right leg and right face is diminished compared to the left. Sensation in the right arm is similar to the left. 5 over 5 strength in all extremities except for the right lower. His emanation of this extremity is limited as he has pain when he tries to raise the leg. Cranial nerves II through XII are overall intact except for the concerns of sensation as noted above. Skin:  Skin is warm, dry. No rash noted. Psychiatric: quiet, soft spoken, distant affect. Otherwise, cognitive and communicative.  ____________________________________________    LABS (pertinent positives/negatives)  Labs Reviewed  BASIC METABOLIC PANEL - Abnormal; Notable for the following:    Sodium 134 (*)    Glucose, Bld 356 (*)    All other components within normal limits  CBC  TROPONIN I     ____________________________________________   EKG  ED ECG REPORT I, Smokey Melott W, the attending physician, personally viewed and interpreted this ECG.   Date: 09/28/2015  EKG Time: 1241  Rate: 96  Rhythm:  Normal sinus rhythm  Axis: Normal  Intervals: Normal  ST&T Change: None noted   ____________________________________________    RADIOLOGY  Chest x-ray IMPRESSION: No active disease.  CT head: IMPRESSION: No acute findings. No intracranial mass, hemorrhage or edema. ____________________________________________   PROCEDURES   ____________________________________________   INITIAL IMPRESSION / ASSESSMENT AND PLAN / ED COURSE  Pertinent labs & imaging results that were available during my care of  the patient were reviewed by me and considered in my medical decision making (see chart for details).  Reports a history in the 34 year old male with a history of prior myocardial infarction and a 4 vessel CABG and diabetes. Complains of chest tightness and discomfort with paresthesias. On neurologic exam, he has decreased grip strength in the right and some decreased strength in the right leg with diminished sensation in the right leg and right face. With these neurologic findings, I have ordered a CT scan of his head.  Patient's troponin is normal. He will need serial enzymes.  Given his notable cardiac history, we will seek admission hospital for serial cardiac enzymes and monitoring and further cardiac workup.  This is been discussed with Dr. Eliane Decree  ____________________________________________   FINAL CLINICAL IMPRESSION(S) / ED DIAGNOSES  Final diagnoses:  Chest pain, unspecified chest pain type  Right sided weakness  Paresthesia    Darien Ramus, MD 09/28/15 1517

## 2015-09-28 NOTE — Progress Notes (Addendum)
A & O. NSR. Room air. No chest pain. Skin checked by Trinna Postanya RN. Trop neg. CT head. Cxr neg. Pt has no further concerns at this time.

## 2015-09-28 NOTE — ED Notes (Signed)
Patient presents to the ED with chest pain, weakness and dizziness.  Patient appears very pale and diaphoretic.   Patient is answering questions quietly, does not appear to want to speak much.   Patient has a history of heart surgery and previous MIs.  Patient is closing eyes and when he was standing was wobbling like he was too weak to stand.

## 2015-09-29 LAB — BASIC METABOLIC PANEL
ANION GAP: 6 (ref 5–15)
BUN: 16 mg/dL (ref 6–20)
CALCIUM: 8.8 mg/dL — AB (ref 8.9–10.3)
CO2: 25 mmol/L (ref 22–32)
Chloride: 105 mmol/L (ref 101–111)
Creatinine, Ser: 0.79 mg/dL (ref 0.61–1.24)
GLUCOSE: 298 mg/dL — AB (ref 65–99)
POTASSIUM: 4.2 mmol/L (ref 3.5–5.1)
Sodium: 136 mmol/L (ref 135–145)

## 2015-09-29 LAB — CBC
HEMATOCRIT: 43.6 % (ref 40.0–52.0)
Hemoglobin: 14.6 g/dL (ref 13.0–18.0)
MCH: 28.5 pg (ref 26.0–34.0)
MCHC: 33.4 g/dL (ref 32.0–36.0)
MCV: 85.2 fL (ref 80.0–100.0)
PLATELETS: 236 10*3/uL (ref 150–440)
RBC: 5.12 MIL/uL (ref 4.40–5.90)
RDW: 12.1 % (ref 11.5–14.5)
WBC: 6.8 10*3/uL (ref 3.8–10.6)

## 2015-09-29 LAB — HEMOGLOBIN A1C: HEMOGLOBIN A1C: 11.4 % — AB (ref 4.0–6.0)

## 2015-09-29 LAB — TROPONIN I: Troponin I: <0.03 ng/mL (ref <0.031)

## 2015-09-29 LAB — VITAMIN B12: Vitamin B-12: 282 pg/mL (ref 180–914)

## 2015-09-29 MED ORDER — ESCITALOPRAM OXALATE 10 MG PO TABS: 20.0000 mg | ORAL_TABLET | Freq: Every day | ORAL | Status: DC

## 2015-09-29 NOTE — Progress Notes (Signed)
Inpatient Diabetes Program Recommendations  AACE/ADA: New Consensus Statement on Inpatient Glycemic Control (2015)  Target Ranges:  Prepandial:   less than 140 mg/dL      Peak postprandial:   less than 180 mg/dL (1-2 hours)      Critically ill patients:  140 - 180 mg/dL   Review of Glycemic Control:  Results for [name], [name] (MRN [id number]) as of 09/29/2015 11:00  Ref. Range 09/28/2015 22:27  Glucose-Capillary Latest Ref Range: 65-99 mg/dL 409245 (H)    Diabetes history: Uncontrolled diabetes Outpatient Diabetes medications: Lantus 30 untis q AM and Lantus 60 units q HS, Novolog 5-15 units tid with meals Current orders for Inpatient glycemic control:  Lantus 30 units q AM and 60 units q PM  Inpatient Diabetes Program Recommendations:    Please consider adding Novolog moderate correction tid with meals and HS and also Novolog 4 units tid with meals (Hold if patient eats less than 50%). Note that A1C in August was 12.0%.  Needs improved control of diabetes at home.   Thanks, Beryl MeagerJenny Jacob Cicero, RN, BC-ADM Inpatient Diabetes Coordinator Pager [phone]

## 2015-09-29 NOTE — Progress Notes (Signed)
[facility]         [place], [place]   09/29/2015  Patient: [name]   Date of Birth:  01-18-1981  Date of admission:  09/28/2015  Date of Discharge  09/29/2015    To Whom it May Concern:   [name]  May return to work on 09/30/2015.  WORK-EMPLOYMENT:  No new restrictions.  Resume former duties.  If you have any questions or concerns, please don't hesitate to call.  Sincerely,   Elby ShowersWALSH, CATHERINE M.D Pager [phone] Office : [phone]   .

## 2015-09-29 NOTE — Discharge Instructions (Signed)
You have an increased dose of your antidepressant. You should follow up with your primary care physician to improve blood sugar control and discuss stress.  DIET:  Cardiac diet and Diabetic diet  DISCHARGE CONDITION:  Stable  ACTIVITY:  Activity as tolerated  OXYGEN:  Home Oxygen: No.   Oxygen Delivery: room air  DISCHARGE LOCATION:  home   If you experience worsening of your admission symptoms, develop shortness of breath, life threatening emergency, suicidal or homicidal thoughts you must seek medical attention immediately by calling 911 or calling your MD immediately  if symptoms less severe.  You Must read complete instructions/literature along with all the possible adverse reactions/side effects for all the Medicines you take and that have been prescribed to you. Take any new Medicines after you have completely understood and accpet all the possible adverse reactions/side effects.   Please note  You were cared for by a hospitalist during your hospital stay. If you have any questions about your discharge medications or the care you received while you were in the hospital after you are discharged, you can call the unit and asked to speak with the hospitalist on call if the hospitalist that took care of you is not available. Once you are discharged, your primary care physician will handle any further medical issues. Please note that NO REFILLS for any discharge medications will be authorized once you are discharged, as it is imperative that you return to your primary care physician (or establish a relationship with a primary care physician if you do not have one) for your aftercare needs so that they can reassess your need for medications and monitor your lab values.

## 2015-09-29 NOTE — Progress Notes (Signed)
A &O. NSR. Room air. Pt reports no pain. IV and tele removed. Prescription  Given to pt. Pt has no further concerns at this time.

## 2015-09-30 ENCOUNTER — Telehealth: Payer: Self-pay

## 2015-09-30 NOTE — Telephone Encounter (Signed)
Transition Care Management Follow-up Telephone Call   Date discharged? 09/29/15   How have you been since you were released from the hospital?  Denies chest pain.  Everything is working [employer]. I believe I am doing all right.   Do you understand why you were in the hospital? Yes, stress related chest pain.   Do you understand the discharge instructions? Yes, and I am taking it easy.     Where were you discharged to? Home.   Items Reviewed:  Medications reviewed: Yes, no problems.  Allergies reviewed: Yes, no changes.  Dietary changes reviewed: Yes, cardiac diet and diabetic diet, no problems.  Referrals reviewed: Yes, none at this time.   Functional Questionnaire:  Activities of Daily Living (ADLs):   He states they are independent in the following: Independent in all ADLs States they require assistance with the following: Does not require assistance at this time.   Any transportation issues/concerns?: No   Any patient concerns? Not at this time.   Confirmed importance and date/time of follow-up visits scheduled Yes, appointment made 10/03/15.    Provider Appointment booked with Naomie Deanarrie Doss, NP (PCP).  Confirmed with patient if condition begins to worsen call PCP or go to the ER.  Patient was given the office number and encouraged to call back with question or concerns.  : Yes, patient verbalized understanding.

## 2015-10-02 NOTE — Discharge Summary (Signed)
[facility] [place] [facility]  DISCHARGE SUMMARY   PATIENT NAME: [name]    MR#:  [id number]  DATE OF BIRTH:  October 31, 1980  DATE OF ADMISSION:  09/28/2015 ADMITTING PHYSICIAN: Auburn BilberryShreyang Patel, MD  DATE OF DISCHARGE: 09/29/2015  PRIMARY CARE PHYSICIAN: Carollee Leitzoss, Carrie M, NP    ADMISSION DIAGNOSIS:  Paresthesia [R20.2] Right sided weakness [M62.89] Chest pain, unspecified chest pain type [R07.9]  DISCHARGE DIAGNOSIS:  Active Problems:   Chest pain   SECONDARY DIAGNOSIS:   Past Medical History  Diagnosis Date  . Coronary atherosclerosis of artery bypass graft     Age 89 - [place]  . Chest pain, unspecified   . Uncontrolled diabetes mellitus with complications (HCC) 2002    poorly controlled  . HLD (hyperlipidemia)     poorly controlled  . HTN (hypertension)   . Obesity, unspecified   . Hx-TIA (transient ischemic attack) 2009    possible-([facility]) Normal MRI of brain and MRA of the head and neck  . Noncompliance     HOSPITAL COURSE:   Patient is a 34 year old white male with history of coronary artery disease that this post CABG comes in with chest pressure paresthesias  1. Chest pain: Patient is high risk given his history of coronary artery disease and CABG. Troponin was negative, EKG nonrevealing, no events on telemetry. He was chest pain free after admission. After lengthy discussion it seems that his chest pain may be more anxiety related. [facility].  2. Hyperlipidemia continue atorvastatin  3. Diabetes type 2: Hemoglobin A1c is elevated at 11.4. He admits that he does not comply to a carbohydrate modified diet and that blood sugars are often high at home. He states that he takes 30 units of Lantus in the morning and 60 in the evening. He agrees to contact his primary care physician for improve management of his diabetes. Lifestyle modification counseling provided.  4. Paresthesias: He had a  CT scan of his head which was normal. B12 level is low normal. Would consider B12 supplementation.  5. Anxiety: After lengthy discussion with the patient it seems that he has significant anxiety due to financial and personal stressors. I increase the dose of his SSRI. I encouraged him to find counseling.   DISCHARGE CONDITIONS:   Stable. He will need to follow-up with his primary care provider in one to 2 weeks.  CONSULTS OBTAINED:  Treatment Team:  Auburn BilberryShreyang Patel, MD  DRUG ALLERGIES:   Allergies  Allergen Reactions  . Nitroglycerin Nausea And Vomiting    DISCHARGE MEDICATIONS:   Discharge Medication List as of 09/29/2015 12:02 PM    CONTINUE these medications which have CHANGED   Details  escitalopram (LEXAPRO) 10 MG tablet Take 2 tablets (20 mg total) by mouth daily., Starting 09/29/2015, Until Discontinued, Print      CONTINUE these medications which have NOT CHANGED   Details  aspirin EC 81 MG tablet Take 81 mg by mouth daily., Until Discontinued, Historical Med    atorvastatin (LIPITOR) 80 MG tablet Take 1 tablet (80 mg total) by mouth daily., Starting 05/07/2015, Until Discontinued, Normal    Insulin Glargine (LANTUS SOLOSTAR) 100 UNIT/ML Solostar Pen Inject 15 Units into the skin daily at 10 pm., Starting 12/25/2014, Until Discontinued, Normal    EPINEPHrine (EPIPEN 2-PAK) 0.3 mg/0.3 mL IJ SOAJ injection Inject 0.3 mLs (0.3 mg total) into the muscle once., Starting 04/24/2015, Print    famotidine (PEPCID) 20 MG tablet  Take 1 tablet (20 mg total) by mouth daily., Starting 06/26/2015, Until Fri 06/25/16, Print    gabapentin (NEURONTIN) 300 MG capsule Take 1 capsule (300 mg total) by mouth at bedtime. Take for 7 days then up to 1 capsule twice daily afterwards., Starting 08/12/2015, Until Discontinued, Normal    insulin aspart (NOVOLOG) 100 UNIT/ML injection Use on a sliding scale between 5-15 units with meals. Max daily 45 units, Normal    tiZANidine (ZANAFLEX) 4 MG tablet  Take 1 tablet (4 mg total) by mouth every 6 (six) hours as needed for muscle spasms., Starting 08/12/2015, Until Discontinued, Normal         DISCHARGE INSTRUCTIONS:    Stable condition. No home health needs. Follow-up with primary care in 1-2 weeks. Heart healthy carbohydrate modified diet.  If you experience worsening of your admission symptoms, develop shortness of breath, life threatening emergency, suicidal or homicidal thoughts you must seek medical attention immediately by calling 911 or calling your MD immediately  if symptoms less severe.  You Must read complete instructions/literature along with all the possible adverse reactions/side effects for all the Medicines you take and that have been prescribed to you. Take any new Medicines after you have completely understood and accept all the possible adverse reactions/side effects.   Please note  You were cared for by a hospitalist during your hospital stay. If you have any questions about your discharge medications or the care you received while you were in the hospital after you are discharged, you can call the unit and asked to speak with the hospitalist on call if the hospitalist that took care of you is not available. Once you are discharged, your primary care physician will handle any further medical issues. Please note that NO REFILLS for any discharge medications will be authorized once you are discharged, as it is imperative that you return to your primary care physician (or establish a relationship with a primary care physician if you do not have one) for your aftercare needs so that they can reassess your need for medications and monitor your lab values.    Today   CHIEF COMPLAINT:   Chief Complaint  Patient presents with  . Chest Pain    HISTORY OF PRESENT ILLNESS:  [name] is a 34 y.o. male with a known history of coronary artery disease status post CABG, hypertension, , diabetes type 2 presents with complaint  of chest pressure since this morning. Patient reports that he was watching TV and developed these symptoms. He also has been having some paresthesia. Patient reports that the chest pressure lasted for few hours now mostly resolved. Denies any nausea vomiting or diarrhea  VITAL SIGNS:  Blood pressure 137/84, pulse 95, temperature 97.8 F (36.6 C), temperature source Oral, resp. rate 18, height 6' (1.829 m), weight 108.863 kg (240 lb), SpO2 99 %.  I/O:  No intake or output data in the 24 hours ending 10/02/15 1606  PHYSICAL EXAMINATION:  GENERAL:  34 y.o.-year-old patient lying in the bed with no acute distress.  LUNGS: Normal breath sounds bilaterally, no wheezing, rales,rhonchi or crepitation. No use of accessory muscles of respiration.  CARDIOVASCULAR: S1, S2 normal. No murmurs, rubs, or gallops.  ABDOMEN: Soft, non-tender, non-distended. Bowel sounds present. No organomegaly or mass.  EXTREMITIES: No pedal edema, cyanosis, or clubbing.  NEUROLOGIC: Cranial nerves II through XII are intact. Muscle strength 5/5 in all extremities. Sensation intact. Gait not checked.  PSYCHIATRIC: The patient is alert and oriented x 3.  SKIN: No obvious rash, lesion, or ulcer.   DATA REVIEW:   CBC  Recent Labs Lab 09/29/15 0521  WBC 6.8  HGB 14.6  HCT 43.6  PLT 236    Chemistries   Recent Labs Lab 09/29/15 0521  NA 136  K 4.2  CL 105  CO2 25  GLUCOSE 298*  BUN 16  CREATININE 0.79  CALCIUM 8.8*    Cardiac Enzymes  Recent Labs Lab 09/29/15 0521  TROPONINI <0.03    Microbiology Results  Results for orders placed or performed in visit on 07/18/14  Culture, blood (single)     Status: None   Collection Time: 07/18/14 11:15 AM  Result Value Ref Range Status   Micro Text Report   Final       COMMENT                   NO GROWTH AEROBICALLY/ANAEROBICALLY IN 5 DAYS   ANTIBIOTIC                                                      Culture, blood (single)     Status: None    Collection Time: 07/18/14 11:25 AM  Result Value Ref Range Status   Micro Text Report   Final       COMMENT                   NO GROWTH AEROBICALLY/ANAEROBICALLY IN 5 DAYS   ANTIBIOTIC                                                      Urine culture     Status: None   Collection Time: 07/18/14 11:30 AM  Result Value Ref Range Status   Micro Text Report   Final       SOURCE: INDWELLING CATHETER    COMMENT                   NO GROWTH IN 36 HOURS   ANTIBIOTIC                                                      Body fluid culture     Status: None   Collection Time: 07/18/14  3:50 PM  Result Value Ref Range Status   Micro Text Report   Final       SOURCE: prostate abscess    ORGANISM 1                HEAVY GROWTH STAPHYLOCOCCUS AUREUS   COMMENT                   NO ANAEROBES ISOLATED IN 4 DAYS   GRAM STAIN                MANY WHITE BLOOD CELLS   GRAM STAIN                MODERATE RED BLOOD CELLS   GRAM STAIN  MODERATE GRAM POSITIVE COCCI IN PAIRS IN CLUSTERS   ANTIBIOTIC                    ORG#1     CIPROFLOXACIN                 S         CLINDAMYCIN                   S         ERYTHROMYCIN                  S         GENTAMICIN                    S         LEVOFLOXACIN                  S         LINEZOLID                     S         OXACILLIN                     S         TIGECYCLINE                   S         CEFOXITIN SCREEN              NEGATIVE  INDUCIBLE CLINDAMYCIN RESISTANNEGATIVE  TRIMETHOPRIM/SULFAMETHOXAZOLE S             RADIOLOGY:  No results found.  EKG:   Orders placed or performed during the hospital encounter of 09/28/15  . EKG 12-Lead  . EKG 12-Lead  . ED EKG within 10 minutes  . ED EKG within 10 minutes  . EKG      Management plans discussed with the patient, family and they are in agreement.  CODE STATUS: Full  TOTAL TIME TAKING CARE OF THIS PATIENT: 45 minutes.  Greater than 50% of time spent in care coordination  and counseling.  Elby Showers M.D on 10/02/2015 at 4:06 PM  Between 7am to 6pm - Pager - [phone]  After 6pm go to [email or web address] - password [email or web address] [facility]  [facility]  Office  [phone]  CC: Primary care physician; Carollee Leitz, NP

## 2015-10-03 ENCOUNTER — Encounter: Payer: Self-pay | Admitting: Nurse Practitioner

## 2015-10-03 ENCOUNTER — Ambulatory Visit (INDEPENDENT_AMBULATORY_CARE_PROVIDER_SITE_OTHER): Payer: Commercial Managed Care - HMO | Admitting: Nurse Practitioner

## 2015-10-03 VITALS — BP 122/80 | HR 92 | Resp 16 | Ht 72.0 in | Wt 241.0 lb

## 2015-10-03 DIAGNOSIS — M5412 Radiculopathy, cervical region: Secondary | ICD-10-CM

## 2015-10-03 DIAGNOSIS — F418 Other specified anxiety disorders: Secondary | ICD-10-CM | POA: Diagnosis not present

## 2015-10-03 DIAGNOSIS — D6489 Other specified anemias: Secondary | ICD-10-CM | POA: Diagnosis not present

## 2015-10-03 DIAGNOSIS — E1165 Type 2 diabetes mellitus with hyperglycemia: Secondary | ICD-10-CM

## 2015-10-03 DIAGNOSIS — Z9119 Patient's noncompliance with other medical treatment and regimen: Secondary | ICD-10-CM | POA: Diagnosis not present

## 2015-10-03 DIAGNOSIS — Z91199 Patient's noncompliance with other medical treatment and regimen due to unspecified reason: Secondary | ICD-10-CM

## 2015-10-03 MED ORDER — INSULIN ASPART 100 UNIT/ML ~~LOC~~ SOLN
SUBCUTANEOUS | Status: DC
Start: 2015-10-03 — End: 2017-03-18

## 2015-10-03 MED ORDER — CYANOCOBALAMIN 1000 MCG/ML IJ SOLN: 1000.0000 ug | Freq: Once | INTRAMUSCULAR | Status: DC

## 2015-10-03 MED ORDER — CYANOCOBALAMIN 1000 MCG/ML IJ SOLN
1000.0000 ug | Freq: Once | INTRAMUSCULAR | Status: AC
  Administered 2015-10-03: 1000 ug via INTRAMUSCULAR

## 2015-10-03 NOTE — Progress Notes (Addendum)
Patient ID: [name], male    DOB: Oct 18, 1980  Age: 34 y.o. MRN: [id number]  CC: Follow-up   HPI [name] presents for Transitional Care Management visit.   1) patient was admitted to the hospital on 09/28/2015 He was discharged 09/29/2015 Discharge diagnosis was chest pain This was thought to be due to anxiety. He is denied counseling multiple times and psychiatry referrals he still declines today.  Patient reports he has been on disability for over 10 years and it is recently been discontinued.   Increased dose of SSRI at last ER visit. He reports no change  Patient still having paresthesias his CT scan of his head was normal. B12 level was low normal patient is noncompliant with oral medications we will start injections today and do once monthly recheck in 2 months.   Diabetes is uncontrolled A1c is 11.4 currently. He does not check his blood sugars regularly he reports he is running in the 230s but has not checked his blood sugar this morning  He keeps canceling his appointments to endocrinology as well He denies being able to afford NovoLog at this time  I have personally reviewed the hospital records and discharge summary.   History [name] has a past medical history of Coronary atherosclerosis of artery bypass graft; Chest pain, unspecified; Uncontrolled diabetes mellitus with complications (HCC) (2002); HLD (hyperlipidemia); HTN (hypertension); Obesity, unspecified; TIA (transient ischemic attack) (2009); and Noncompliance.   He has past surgical history that includes Coronary artery bypass graft (2007).   His family history includes COPD in his father; Coronary artery disease in his maternal grandfather; Diabetes in his maternal grandfather; Emphysema in his father; Heart attack in his maternal grandfather; Heart disease in his maternal grandfather; Hyperlipidemia in his maternal grandfather; Hypertension in his maternal grandfather.He reports that he quit smoking about 10  years ago. He has quit using smokeless tobacco. He reports that he does not drink alcohol or use illicit drugs.  Outpatient Prescriptions Prior to Visit  Medication Sig Dispense Refill  . aspirin EC 81 MG tablet Take 81 mg by mouth daily.    Marland Kitchen. atorvastatin (LIPITOR) 80 MG tablet Take 1 tablet (80 mg total) by mouth daily. (Patient taking differently: Take 80 mg by mouth every morning. ) 90 tablet 3  . EPINEPHrine (EPIPEN 2-PAK) 0.3 mg/0.3 mL IJ SOAJ injection Inject 0.3 mLs (0.3 mg total) into the muscle once. (Patient not taking: Reported on 06/26/2015) 1 Device 0  . escitalopram (LEXAPRO) 10 MG tablet Take 2 tablets (20 mg total) by mouth daily. 60 tablet 0  . famotidine (PEPCID) 20 MG tablet Take 1 tablet (20 mg total) by mouth daily. 30 tablet 1  . gabapentin (NEURONTIN) 300 MG capsule Take 1 capsule (300 mg total) by mouth at bedtime. Take for 7 days then up to 1 capsule twice daily afterwards. 60 capsule 3  . insulin aspart (NOVOLOG) 100 UNIT/ML injection Use on a sliding scale between 5-15 units with meals. Max daily 45 units (Patient not taking: Reported on 06/26/2015) 3 vial PRN  . Insulin Glargine (LANTUS SOLOSTAR) 100 UNIT/ML Solostar Pen Inject 15 Units into the skin daily at 10 pm. (Patient taking differently: Inject 30-60 Units into the skin 2 (two) times daily. Inject 30 units subcutaneous every morning. Inject 60 units subcutaneous every night at bedtime. Per sliding scale.) 5 pen 3  . tiZANidine (ZANAFLEX) 4 MG tablet Take 1 tablet (4 mg total) by mouth every 6 (six) hours as needed for muscle spasms.  120 tablet 0   No facility-administered medications prior to visit.    ROS Review of Systems  Constitutional: Negative for fever, chills, diaphoresis and fatigue.  Eyes: Negative for visual disturbance.  Respiratory: Negative for chest tightness, shortness of breath and wheezing.   Cardiovascular: Negative for chest pain, palpitations and leg swelling.  Gastrointestinal: Negative  for nausea, vomiting and diarrhea.  Skin: Negative for rash.  Psychiatric/Behavioral: Positive for sleep disturbance. Negative for suicidal ideas. The patient is nervous/anxious.     Objective:  BP 122/80 mmHg  Pulse 92  Resp 6  Ht 6' (1.829 m)  Wt 241 lb (109.317 kg)  BMI 32.68 kg/m2  Physical Exam  Constitutional: He is oriented to person, place, and time. He appears well-developed and well-nourished. No distress.  HENT:  Head: Normocephalic and atraumatic.  Right Ear: External ear normal.  Left Ear: External ear normal.  Cardiovascular: Normal rate, regular rhythm and normal heart sounds.  Exam reveals no gallop and no friction rub.   No murmur heard. Pulmonary/Chest: Effort normal and breath sounds normal. No respiratory distress. He has no wheezes. He has no rales. He exhibits no tenderness.  Neurological: He is alert and oriented to person, place, and time.  Skin: Skin is warm and dry. No rash noted. He is not diaphoretic.  Psychiatric: He has a normal mood and affect. His behavior is normal. Judgment and thought content normal.   Assessment & Plan:   [name] was seen today for follow-up.  Diagnoses and all orders for this visit:  Anemia due to other cause -     cyanocobalamin ((VITAMIN B-12)) injection 1,000 mcg; Inject 1 mL (1,000 mcg total) into the muscle once.  Other orders -     Discontinue: cyanocobalamin (,VITAMIN B-12,) 1000 MCG/ML injection; Inject 1 mL (1,000 mcg total) into the muscle once. -     Discontinue: cyanocobalamin (,VITAMIN B-12,) 1000 MCG/ML injection; Inject 1 mL (1,000 mcg total) into the muscle once.  I have discontinued Mr. Goldston cyanocobalamin and cyanocobalamin. I am also having him maintain his Insulin Glargine, EPINEPHrine, atorvastatin, insulin aspart, aspirin EC, famotidine, gabapentin, tiZANidine, and escitalopram. We administered cyanocobalamin.  Meds ordered this encounter  Medications  . DISCONTD: cyanocobalamin (,VITAMIN B-12,)  1000 MCG/ML injection    Sig: Inject 1 mL (1,000 mcg total) into the muscle once.    Dispense:  1 mL    Refill:  0  . DISCONTD: cyanocobalamin (,VITAMIN B-12,) 1000 MCG/ML injection    Sig: Inject 1 mL (1,000 mcg total) into the muscle once.    Dispense:  1 mL    Refill:  0  . cyanocobalamin ((VITAMIN B-12)) injection 1,000 mcg    Sig:     Follow-up: Return in about 4 weeks (around 10/31/2015) for Follow up and B12 .

## 2015-10-03 NOTE — Patient Instructions (Addendum)
[facility] Follow [facility]  Good luck finding a new job!   See you next month for follow up and B12 injection.

## 2015-10-03 NOTE — Assessment & Plan Note (Signed)
Patient is currently on Lexapro 20 mg up from 10 in the ER. He did not feel any difference but it is only been 2 weeks. We will follow-up next month

## 2015-10-03 NOTE — Assessment & Plan Note (Signed)
Started B12 injections today will do monthly instead of weekly at the beginning due to his low normal B12 reading in the hospital. Patient is noncompliant with oral medications. Patient denies significant gabapentin at this point he did not know is ready and has been at the pharmacy since November 1. He was advised to start this.

## 2015-10-03 NOTE — Assessment & Plan Note (Addendum)
Patient is still noncompliant with medical treatment advice. He uses the emergency department for care often. He is using the Lantus whichever way he feels. His blood sugars are still uncontrolled and high A1c of 11.4 he cancels all of his referrals. He is also had 2 no-shows and several cancellations with our office. Patient is not compliant with his medications including Neurontin and NovoLog. He feels that he cannot afford these medications despite being on Medicare and previously disability. He has been given information about how to afford his medications and financial resources that are available in the past.  I have personally reviewed the hospital records and discharge summary

## 2015-10-13 ENCOUNTER — Encounter: Payer: Self-pay | Admitting: Emergency Medicine

## 2015-10-13 ENCOUNTER — Emergency Department
Admission: EM | Admit: 2015-10-13 | Discharge: 2015-10-13 | Disposition: A | Discharge status: Home / Self Care | Payer: Commercial Managed Care - HMO | Attending: Emergency Medicine | Admitting: Emergency Medicine

## 2015-10-13 DIAGNOSIS — Z87891 Personal history of nicotine dependence: Secondary | ICD-10-CM | POA: Insufficient documentation

## 2015-10-13 DIAGNOSIS — L500 Allergic urticaria: Secondary | ICD-10-CM | POA: Insufficient documentation

## 2015-10-13 DIAGNOSIS — X58XXXA Exposure to other specified factors, initial encounter: Secondary | ICD-10-CM | POA: Insufficient documentation

## 2015-10-13 DIAGNOSIS — F419 Anxiety disorder, unspecified: Secondary | ICD-10-CM | POA: Insufficient documentation

## 2015-10-13 DIAGNOSIS — E118 Type 2 diabetes mellitus with unspecified complications: Secondary | ICD-10-CM | POA: Insufficient documentation

## 2015-10-13 DIAGNOSIS — Y998 Other external cause status: Secondary | ICD-10-CM | POA: Insufficient documentation

## 2015-10-13 DIAGNOSIS — Y9389 Activity, other specified: Secondary | ICD-10-CM | POA: Insufficient documentation

## 2015-10-13 DIAGNOSIS — Y9289 Other specified places as the place of occurrence of the external cause: Secondary | ICD-10-CM | POA: Insufficient documentation

## 2015-10-13 DIAGNOSIS — Z794 Long term (current) use of insulin: Secondary | ICD-10-CM | POA: Insufficient documentation

## 2015-10-13 DIAGNOSIS — Z7982 Long term (current) use of aspirin: Secondary | ICD-10-CM | POA: Insufficient documentation

## 2015-10-13 DIAGNOSIS — I10 Essential (primary) hypertension: Secondary | ICD-10-CM | POA: Insufficient documentation

## 2015-10-13 DIAGNOSIS — T7840XA Allergy, unspecified, initial encounter: Secondary | ICD-10-CM | POA: Insufficient documentation

## 2015-10-13 DIAGNOSIS — Z79899 Other long term (current) drug therapy: Secondary | ICD-10-CM | POA: Insufficient documentation

## 2015-10-13 DIAGNOSIS — L509 Urticaria, unspecified: Secondary | ICD-10-CM

## 2015-10-13 MED ORDER — SODIUM CHLORIDE 0.9 % IV BOLUS (SEPSIS)
1000.0000 mL | Freq: Once | INTRAVENOUS | Status: AC
  Administered 2015-10-13: 1000 mL via INTRAVENOUS

## 2015-10-13 MED ORDER — LORAZEPAM 2 MG/ML IJ SOLN
0.5000 mg | Freq: Once | INTRAMUSCULAR | Status: AC
  Administered 2015-10-13: 0.5 mg via INTRAVENOUS
  Filled 2015-10-13: qty 1

## 2015-10-13 MED ORDER — FAMOTIDINE 20 MG PO TABS: 20.0000 mg | ORAL_TABLET | Freq: Two times a day (BID) | ORAL | Status: DC

## 2015-10-13 MED ORDER — PREDNISONE 20 MG PO TABS: ORAL_TABLET | ORAL | Status: DC

## 2015-10-13 MED ORDER — EPINEPHRINE 0.3 MG/0.3ML IJ SOAJ: 0.3000 mg | Freq: Once | INTRAMUSCULAR | Status: DC

## 2015-10-13 NOTE — Discharge Instructions (Signed)
1. Take the following medicines for the next 4 days: °Prednisone 60mg daily °Pepcid 20mg twice daily °2. Take Benadryl as needed for itching. °3. Use Epi-Pen in case of acute, life-threatening allergic reaction. °4. Return to the ER for worsening symptoms, persistent vomiting, difficulty breathing or other concerns. ° °Allergies °An allergy is an abnormal reaction to a substance by the body's defense system (immune system). Allergies can develop at any age. °WHAT CAUSES ALLERGIES? °An allergic reaction happens when the immune system mistakenly reacts to a normally harmless substance, called an allergen, as if it were harmful. The immune system releases antibodies to fight the substance. Antibodies eventually release a chemical called histamine into the bloodstream. The release of histamine is meant to protect the body from infection, but it also causes discomfort. °An allergic reaction can be triggered by: °· Eating an allergen. °· Inhaling an allergen. °· Touching an allergen. °WHAT TYPES OF ALLERGIES ARE THERE? °There are many types of allergies. Common types include: °· Seasonal allergies. People with this type of allergy are usually allergic to substances that are only present during certain seasons, such as molds and pollens. °· Food allergies. °· Drug allergies. °· Insect allergies. °· Animal dander allergies. °WHAT ARE SYMPTOMS OF ALLERGIES? °Possible allergy symptoms include: °· Swelling of the lips, face, tongue, mouth, or throat. °· Sneezing, coughing, or wheezing. °· Nasal congestion. °· Tingling in the mouth. °· Rash. °· Itching. °· Itchy, red, swollen areas of skin (hives). °· Watery eyes. °· Vomiting. °· Diarrhea. °· Dizziness. °· Lightheadedness. °· Fainting. °· Trouble breathing or swallowing. °· Chest tightness. °· Rapid heartbeat. °HOW ARE ALLERGIES DIAGNOSED? °Allergies are diagnosed with a medical and family history and one or more of the following: °· Skin tests. °· Blood tests. °· A food diary.  A food diary is a record of all the foods and drinks you have in a day and of all the symptoms you experience. °· The results of an elimination diet. An elimination diet involves eliminating foods from your diet and then adding them back in one by one to find out if a certain food causes an allergic reaction. °HOW ARE ALLERGIES TREATED? °There is no cure for allergies, but allergic reactions can be treated with medicine. Severe reactions usually need to be treated at a hospital. °HOW CAN REACTIONS BE PREVENTED? °The best way to prevent an allergic reaction is by avoiding the substance you are allergic to. Allergy shots and medicines can also help prevent reactions in some cases. People with severe allergic reactions may be able to prevent a life-threatening reaction called anaphylaxis with a medicine given right after exposure to the allergen. °  °This information is not intended to replace advice given to you by your health care provider. Make sure you discuss any questions you have with your health care provider. °  °Document Released: 12/21/2002 Document Revised: 10/18/2014 Document Reviewed: 07/09/2014 °Elsevier Interactive Patient Education ©2016 [employer] ° °Hives °Hives are itchy, red, swollen areas of the skin. They can vary in size and location on your body. Hives can come and go for hours or several days (acute hives) or for several weeks (chronic hives). Hives do not spread from person to person (noncontagious). They may get worse with scratching, exercise, and emotional stress. °CAUSES  °· Allergic reaction to food, additives, or drugs. °· Infections, including the common cold. °· Illness, such as vasculitis, lupus, or thyroid disease. °· Exposure to sunlight, heat, or cold. °· Exercise. °· Stress. °· Contact with   chemicals. °SYMPTOMS  °· Red or white swollen patches on the skin. The patches may change size, shape, and location quickly and repeatedly. °· Itching. °· Swelling of the hands, feet, and  face. This may occur if hives develop deeper in the skin. °DIAGNOSIS  °Your caregiver can usually tell what is wrong by performing a physical exam. Skin or blood tests may also be done to determine the cause of your hives. In some cases, the cause cannot be determined. °TREATMENT  °Mild cases usually get better with medicines such as antihistamines. Severe cases may require an emergency epinephrine injection. If the cause of your hives is known, treatment includes avoiding that trigger.  °HOME CARE INSTRUCTIONS  °· Avoid causes that trigger your hives. °· Take antihistamines as directed by your caregiver to reduce the severity of your hives. Non-sedating or low-sedating antihistamines are usually recommended. Do not drive while taking an antihistamine. °· Take any other medicines prescribed for itching as directed by your caregiver. °· Wear loose-fitting clothing. °· Keep all follow-up appointments as directed by your caregiver. °SEEK MEDICAL CARE IF:  °· You have persistent or severe itching that is not relieved with medicine. °· You have painful or swollen joints. °SEEK IMMEDIATE MEDICAL CARE IF:  °· You have a fever. °· Your tongue or lips are swollen. °· You have trouble breathing or swallowing. °· You feel tightness in the throat or chest. °· You have abdominal pain. °These problems may be the first sign of a life-threatening allergic reaction. Call your local emergency services (911 in [place] °MAKE SURE YOU:  °· Understand these instructions. °· Will watch your condition. °· Will get help right away if you are not doing well or get worse. °  °This information is not intended to replace advice given to you by your health care provider. Make sure you discuss any questions you have with your health care provider. °  °Document Released: 09/27/2005 Document Revised: 10/02/2013 Document Reviewed: 12/21/2011 °Elsevier Interactive Patient Education ©2016 [employer]. ° °

## 2015-10-13 NOTE — ED Provider Notes (Signed)
[facility] Emergency Department Provider Note  ____________________________________________  Time seen: Approximately 1:58 AM  I have reviewed the triage vital signs and the nursing notes.   HISTORY  Chief Complaint Allergic Reaction    HPI [name] is a 35 y.o. male who presents to the ED via EMS from home with a chief complain of allergic reaction. Patient states he awoke with a sensation of throat closing and hives on his trunk. Started on gabapentin 3 days ago for peripheral neuropathy. Denies other new medications including over-the-counter medicines. Denies new exposures or products. Ate hamburger for dinner last night.Received per EMS: 50 mg IV Benadryl, 125 mg IV SoluMedrol and 150 mg IV Zantac. EMS reports hives improving. Patient reports he is already feeling much better. Denies recent fever, chills, chest pain, shortness of breath, abdominal pain, nausea, vomiting, diarrhea.   Past Medical History  Diagnosis Date  . Coronary atherosclerosis of artery bypass graft     Age 89 - [place]  . Chest pain, unspecified   . Uncontrolled diabetes mellitus with complications (HCC) 2002    poorly controlled  . HLD (hyperlipidemia)     poorly controlled  . HTN (hypertension)   . Obesity, unspecified   . Hx-TIA (transient ischemic attack) 2009    possible-([facility]) Normal MRI of brain and MRA of the head and neck  . Noncompliance     Patient Active Problem List   Diagnosis Date Noted  . Chest pain 09/28/2015  . Non compliance with medical treatment 03/24/2015  . Left flank pain 03/24/2015  . Cervical radiculitis 09/04/2014  . Acute prostatitis 07/20/2014  . Diabetes mellitus type 2 with complications, uncontrolled (HCC) 02/13/2014  . Macular edema 02/13/2014  . HLD (hyperlipidemia) 11/08/2013  . Left shoulder pain 11/08/2013  . Financial difficulties 11/08/2013  . Depression with anxiety 11/08/2013  . Adjustment disorder with anxiety  10/26/2012  . Headache(784.0) 05/04/2011  . OBESITY 12/22/2010  . Atypical chest pain 03/05/2009  . Hyperlipidemia 03/03/2009  . Essential hypertension 03/03/2009  . CAD, ARTERY BYPASS GRAFT 03/03/2009    Past Surgical History  Procedure Laterality Date  . Coronary artery bypass graft  2007    x 4 (age 24)    Current Outpatient Rx  Name  Route  Sig  Dispense  Refill  . aspirin EC 81 MG tablet   Oral   Take 81 mg by mouth daily.         Marland Kitchen atorvastatin (LIPITOR) 80 MG tablet   Oral   Take 1 tablet (80 mg total) by mouth daily. Patient taking differently: Take 80 mg by mouth every morning.    90 tablet   3   . EPINEPHrine (EPIPEN 2-PAK) 0.3 mg/0.3 mL IJ SOAJ injection   Intramuscular   Inject 0.3 mLs (0.3 mg total) into the muscle once. Patient not taking: Reported on 06/26/2015   1 Device   0   . escitalopram (LEXAPRO) 10 MG tablet   Oral   Take 2 tablets (20 mg total) by mouth daily.   60 tablet   0   . famotidine (PEPCID) 20 MG tablet   Oral   Take 1 tablet (20 mg total) by mouth daily.   30 tablet   1   . gabapentin (NEURONTIN) 300 MG capsule   Oral   Take 1 capsule (300 mg total) by mouth at bedtime. Take for 7 days then up to 1 capsule twice daily afterwards.   60 capsule   3   .  insulin aspart (NOVOLOG) 100 UNIT/ML injection      Use on a sliding scale between 5-15 units with meals. Max daily 45 units   3 vial   PRN   . Insulin Glargine (LANTUS SOLOSTAR) 100 UNIT/ML Solostar Pen   Subcutaneous   Inject 15 Units into the skin daily at 10 pm. Patient taking differently: Inject 30-60 Units into the skin 2 (two) times daily. Inject 30 units subcutaneous every morning. Inject 60 units subcutaneous every night at bedtime. Per sliding scale.   5 pen   3   . tiZANidine (ZANAFLEX) 4 MG tablet   Oral   Take 1 tablet (4 mg total) by mouth every 6 (six) hours as needed for muscle spasms.   120 tablet   0     Allergies Nitroglycerin  Family History   Problem Relation Age of Onset  . Emphysema Father     + smoker  . COPD Father     Died age 36  . Coronary artery disease Maternal Grandfather   . Heart attack Maternal Grandfather   . Diabetes Maternal Grandfather   . Hypertension Maternal Grandfather   . Hyperlipidemia Maternal Grandfather   . Heart disease Maternal Grandfather     CAD  . Lung cancer      paternal uncles and aunts (all smokers)  . Coronary artery disease      premature-family history    Social History Social History  Substance Use Topics  . Smoking status: Former Smoker    Quit date: 10/11/2004  . Smokeless tobacco: Former NeurosurgeonUser     Comment: used to smoke 1 1/2 ppd  . Alcohol Use: No     Comment: 1-2 drinks per month    Review of Systems Constitutional: No fever/chills Eyes: No visual changes. ENT: No sore throat. Cardiovascular: Denies chest pain. Respiratory: Denies shortness of breath. Gastrointestinal: No abdominal pain.  No nausea, no vomiting.  No diarrhea.  No constipation. Genitourinary: Negative for dysuria. Musculoskeletal: Negative for back pain. Skin: Positive for rash. Neurological: Negative for headaches, focal weakness or numbness.  10-point ROS otherwise negative.  ____________________________________________   PHYSICAL EXAM:  VITAL SIGNS: ED Triage Vitals  Enc Vitals Group     BP 10/13/15 0150 136/98 mmHg     Pulse Rate 10/13/15 0150 91     Resp --      Temp 10/13/15 0150 97.8 F (36.6 C)     Temp Source 10/13/15 0150 Oral     SpO2 10/13/15 0150 97 %     Weight 10/13/15 0150 247 lb 6.4 oz (112.22 kg)     Height 10/13/15 0150 6' (1.829 m)     Head Cir --      Peak Flow --      Pain Score 10/13/15 0151 10     Pain Loc --      Pain Edu? --      Excl. in GC? --     Constitutional: Alert and oriented. Well appearing and mildly anxious. Eyes: Conjunctivae are normal. PERRL. EOMI. Head: Atraumatic. Nose: No congestion/rhinnorhea. Mouth/Throat: Mucous membranes are  moist.  Oropharynx non-erythematous.  There is no facial, lips or tongue swelling. Neck: No stridor.  Soft submental space. Cardiovascular: Normal rate, regular rhythm. Grossly normal heart sounds.  Good peripheral circulation. Respiratory: Normal respiratory effort.  No retractions. Lungs CTAB. No wheezing. Gastrointestinal: Soft and nontender. No distention. No abdominal bruits. No CVA tenderness. Musculoskeletal: No lower extremity tenderness nor edema.  No joint effusions. Neurologic:  Normal speech and language. No gross focal neurologic deficits are appreciated. No gait instability. Skin:  Skin is warm, dry and intact. Hives noted along trunk, back and forearms.Marland Kitchen Psychiatric: Mood and affect are anxiety. Speech and behavior are normal.  ____________________________________________   LABS (all labs ordered are listed, but only abnormal results are displayed)  Labs Reviewed - No data to display ____________________________________________  EKG  ED ECG REPORT I, Ashlon Lottman J, the attending physician, personally viewed and interpreted this ECG.   Date: 10/13/2015  EKG Time: 0148  Rate: 91  Rhythm: normal EKG, normal sinus rhythm  Axis: Normal  Intervals:none  ST&T Change: Nonspecific  ____________________________________________  RADIOLOGY  None ____________________________________________   PROCEDURES  Procedure(s) performed: None  Critical Care performed: No  ____________________________________________   INITIAL IMPRESSION / ASSESSMENT AND PLAN / ED COURSE  Pertinent labs & imaging results that were available during my care of the patient were reviewed by me and considered in my medical decision making (see chart for details).  35 year old male with acute allergic reaction. He was properly medicated by EMS prior to arrival. Hives are starting to improve. Given patient's complex cardiac history and no airway distress, will hold off on epinephrine. Will continue  to monitor in the ED  ----------------------------------------- 4:07 AM on 10/13/2015 -----------------------------------------  Patient sleeping in no acute distress. Will continue to observe for a total of 3 hours.   ----------------------------------------- 5:08 AM on 10/13/2015 -----------------------------------------  Patient sleeping in no acute distress. Hives resolved. Room air saturations 98%. Blood pressure improved. No angioedema, drooling or airway distress. Will continue patient on prednisone and Pepcid for total of 5 days, EpiPen as needed and follow-up with ENT for allergy testing. Strict return precautions given. Patient verbalizes understanding and agrees with plan of care. ____________________________________________   FINAL CLINICAL IMPRESSION(S) / ED DIAGNOSES  Final diagnoses:  Allergic reaction, initial encounter      Irean Hong, MD 10/13/15 [phone]

## 2015-10-13 NOTE — ED Notes (Signed)
Per EMS patient went to sleep and woke up out of anxiety.  He felt as if his throat were closing.  Upon arrival. He has scant hives along his upper posterior back and both inner forearms.  He was treated with 50mg  benadryl, 125 Solu Medrol and Zantac by EMS.  Patient states he feels much better.  He also states his anxiety is high at the moment

## 2015-10-21 ENCOUNTER — Encounter: Payer: Self-pay | Admitting: Nurse Practitioner

## 2015-10-22 ENCOUNTER — Telehealth: Payer: Self-pay

## 2015-10-22 ENCOUNTER — Telehealth: Payer: Self-pay | Admitting: Nurse Practitioner

## 2015-10-22 NOTE — Telephone Encounter (Signed)
[facility] dismissed [facility] by Naomie Deanarrie Doss NP-C , effective October 21, 2015. Dismissal letter sent out by certified / registered mail. [facility]  Certified dismissal letter returned as undeliverable, unclaimed, return to sender after three attempts by [employer]. Letter placed in another envelope and resent as 1st class mail which does not require a signature. 11/21/15 DAJ

## 2015-10-22 NOTE — Telephone Encounter (Signed)
Started Dismissal process in HIM.

## 2015-11-03 ENCOUNTER — Ambulatory Visit: Payer: Commercial Managed Care - HMO | Admitting: Nurse Practitioner

## 2015-11-04 ENCOUNTER — Ambulatory Visit: Payer: Commercial Managed Care - HMO | Admitting: Nurse Practitioner

## 2015-11-07 ENCOUNTER — Ambulatory Visit: Payer: Self-pay | Admitting: Nurse Practitioner

## 2015-11-07 ENCOUNTER — Encounter: Payer: Self-pay | Admitting: *Deleted

## 2015-11-10 ENCOUNTER — Ambulatory Visit: Payer: Commercial Managed Care - HMO | Admitting: Nurse Practitioner

## 2015-12-08 ENCOUNTER — Emergency Department
Admission: EM | Admit: 2015-12-08 | Discharge: 2015-12-08 | Disposition: A | Discharge status: Home / Self Care | Payer: Self-pay | Attending: Emergency Medicine | Admitting: Emergency Medicine

## 2015-12-08 ENCOUNTER — Emergency Department: Payer: Self-pay

## 2015-12-08 ENCOUNTER — Encounter: Payer: Self-pay | Admitting: Emergency Medicine

## 2015-12-08 DIAGNOSIS — Z87891 Personal history of nicotine dependence: Secondary | ICD-10-CM | POA: Insufficient documentation

## 2015-12-08 DIAGNOSIS — R110 Nausea: Secondary | ICD-10-CM | POA: Insufficient documentation

## 2015-12-08 DIAGNOSIS — I10 Essential (primary) hypertension: Secondary | ICD-10-CM | POA: Insufficient documentation

## 2015-12-08 DIAGNOSIS — E118 Type 2 diabetes mellitus with unspecified complications: Secondary | ICD-10-CM | POA: Insufficient documentation

## 2015-12-08 DIAGNOSIS — Z794 Long term (current) use of insulin: Secondary | ICD-10-CM | POA: Insufficient documentation

## 2015-12-08 DIAGNOSIS — R1031 Right lower quadrant pain: Secondary | ICD-10-CM | POA: Insufficient documentation

## 2015-12-08 DIAGNOSIS — Z7982 Long term (current) use of aspirin: Secondary | ICD-10-CM | POA: Insufficient documentation

## 2015-12-08 DIAGNOSIS — Z79899 Other long term (current) drug therapy: Secondary | ICD-10-CM | POA: Insufficient documentation

## 2015-12-08 HISTORY — DX: Calculus of kidney: N20.0

## 2015-12-08 LAB — URINALYSIS COMPLETE WITH MICROSCOPIC (ARMC ONLY)
BACTERIA UA: NONE SEEN
Bilirubin Urine: NEGATIVE
Glucose, UA: >500 mg/dL — AB
Ketones, ur: NEGATIVE mg/dL
LEUKOCYTES UA: NEGATIVE
NITRITE: NEGATIVE
PH: 5 (ref 5.0–8.0)
PROTEIN: NEGATIVE mg/dL
SQUAMOUS EPITHELIAL / LPF: NONE SEEN
Specific Gravity, Urine: 1.033 — ABNORMAL HIGH (ref 1.005–1.030)
WBC UA: NONE SEEN WBC/hpf (ref 0–5)

## 2015-12-08 LAB — COMPREHENSIVE METABOLIC PANEL
ALT: 27 U/L (ref 17–63)
ANION GAP: 7 (ref 5–15)
AST: 16 U/L (ref 15–41)
Albumin: 4.3 g/dL (ref 3.5–5.0)
Alkaline Phosphatase: 50 U/L (ref 38–126)
BUN: 14 mg/dL (ref 6–20)
CHLORIDE: 104 mmol/L (ref 101–111)
CO2: 25 mmol/L (ref 22–32)
Calcium: 9 mg/dL (ref 8.9–10.3)
Creatinine, Ser: 0.9 mg/dL (ref 0.61–1.24)
Glucose, Bld: 351 mg/dL — ABNORMAL HIGH (ref 65–99)
POTASSIUM: 4.4 mmol/L (ref 3.5–5.1)
Sodium: 136 mmol/L (ref 135–145)
Total Bilirubin: 0.7 mg/dL (ref 0.3–1.2)
Total Protein: 7.5 g/dL (ref 6.5–8.1)

## 2015-12-08 LAB — CBC
HEMATOCRIT: 45.4 % (ref 40.0–52.0)
HEMOGLOBIN: 15.7 g/dL (ref 13.0–18.0)
MCH: 28.8 pg (ref 26.0–34.0)
MCHC: 34.7 g/dL (ref 32.0–36.0)
MCV: 83.2 fL (ref 80.0–100.0)
Platelets: 236 10*3/uL (ref 150–440)
RBC: 5.46 MIL/uL (ref 4.40–5.90)
RDW: 12.6 % (ref 11.5–14.5)
WBC: 6.2 10*3/uL (ref 3.8–10.6)

## 2015-12-08 LAB — LIPASE, BLOOD: Lipase: 32 U/L (ref 11–51)

## 2015-12-08 MED ORDER — ONDANSETRON HCL 4 MG/2ML IJ SOLN
4.0000 mg | Freq: Once | INTRAMUSCULAR | Status: AC
  Administered 2015-12-08: 4 mg via INTRAVENOUS
  Filled 2015-12-08: qty 2

## 2015-12-08 MED ORDER — DICYCLOMINE HCL 20 MG PO TABS: 20.0000 mg | ORAL_TABLET | Freq: Three times a day (TID) | ORAL | Status: DC | PRN

## 2015-12-08 MED ORDER — MORPHINE SULFATE (PF) 4 MG/ML IV SOLN
4.0000 mg | Freq: Once | INTRAVENOUS | Status: AC
  Administered 2015-12-08: 4 mg via INTRAVENOUS
  Filled 2015-12-08: qty 1

## 2015-12-08 MED ORDER — SODIUM CHLORIDE 0.9 % IV BOLUS (SEPSIS)
1000.0000 mL | Freq: Once | INTRAVENOUS | Status: AC
  Administered 2015-12-08: 1000 mL via INTRAVENOUS

## 2015-12-08 MED ORDER — ONDANSETRON HCL 4 MG PO TABS: 4.0000 mg | ORAL_TABLET | Freq: Every day | ORAL | Status: DC | PRN

## 2015-12-08 MED ORDER — ONDANSETRON HCL 4 MG/2ML IJ SOLN
4.0000 mg | Freq: Once | INTRAMUSCULAR | Status: AC | PRN
  Administered 2015-12-08: 4 mg via INTRAVENOUS
  Filled 2015-12-08: qty 2

## 2015-12-08 MED ORDER — FENTANYL CITRATE (PF) 100 MCG/2ML IJ SOLN
50.0000 ug | Freq: Once | INTRAMUSCULAR | Status: AC
  Administered 2015-12-08: 50 ug via INTRAVENOUS

## 2015-12-08 MED ORDER — FENTANYL CITRATE (PF) 100 MCG/2ML IJ SOLN
INTRAMUSCULAR | Status: AC
  Administered 2015-12-08: 50 ug via INTRAVENOUS
  Filled 2015-12-08: qty 2

## 2015-12-08 NOTE — ED Notes (Signed)
Lower R abdominal pain x 4 days.

## 2015-12-08 NOTE — Discharge Instructions (Signed)

## 2015-12-08 NOTE — ED Provider Notes (Signed)
[facility] Emergency Department Provider Note  ____________________________________________  Time seen: Approximately 11:49 AM  I have reviewed the triage vital signs and the nursing notes.   HISTORY  Chief Complaint Abdominal Pain    HPI [name] is a 35 y.o. male with a history of coronary artery disease status post CABG as well as kidney stones who is presenting today with right lower quadrant abdominal pain. He says the pain is a 10 out of 10 and sharp at this time. He says that it is been on and off over the past 3 days and associated with nausea but no vomiting. He denies any fever or chills. He says the pain is now radiating up into his right sided back as well as his right testicle. He denies any blood in his urine.   Past Medical History  Diagnosis Date  . Coronary atherosclerosis of artery bypass graft     Age 21 - [place]  . Chest pain, unspecified   . Uncontrolled diabetes mellitus with complications (HCC) 2002    poorly controlled  . HLD (hyperlipidemia)     poorly controlled  . HTN (hypertension)   . Obesity, unspecified   . Hx-TIA (transient ischemic attack) 2009    possible-([facility]) Normal MRI of brain and MRA of the head and neck  . Noncompliance   . Kidney stone   . MI, old     Patient Active Problem List   Diagnosis Date Noted  . Chest pain 09/28/2015  . Non compliance with medical treatment 03/24/2015  . Left flank pain 03/24/2015  . Cervical radiculitis 09/04/2014  . Acute prostatitis 07/20/2014  . Diabetes mellitus type 2 with complications, uncontrolled (HCC) 02/13/2014  . Macular edema 02/13/2014  . HLD (hyperlipidemia) 11/08/2013  . Left shoulder pain 11/08/2013  . Financial difficulties 11/08/2013  . Depression with anxiety 11/08/2013  . Adjustment disorder with anxiety 10/26/2012  . Headache(784.0) 05/04/2011  . OBESITY 12/22/2010  . Atypical chest pain 03/05/2009  . Hyperlipidemia 03/03/2009  . Essential  hypertension 03/03/2009  . CAD, ARTERY BYPASS GRAFT 03/03/2009    Past Surgical History  Procedure Laterality Date  . Coronary artery bypass graft  2007    x 4 (age 14)    Current Outpatient Rx  Name  Route  Sig  Dispense  Refill  . aspirin EC 81 MG tablet   Oral   Take 81 mg by mouth daily.         Marland Kitchen atorvastatin (LIPITOR) 80 MG tablet   Oral   Take 1 tablet (80 mg total) by mouth daily. Patient taking differently: Take 80 mg by mouth every morning.    90 tablet   3   . escitalopram (LEXAPRO) 10 MG tablet   Oral   Take 2 tablets (20 mg total) by mouth daily.   60 tablet   0   . Insulin Glargine (LANTUS SOLOSTAR) 100 UNIT/ML Solostar Pen   Subcutaneous   Inject 15 Units into the skin daily at 10 pm. Patient taking differently: Inject 50 Units into the skin 2 (two) times daily.    5 pen   3   . EPINEPHrine 0.3 mg/0.3 mL IJ SOAJ injection   Intramuscular   Inject 0.3 mLs (0.3 mg total) into the muscle once. Patient not taking: Reported on 12/08/2015   1 Device   0   . famotidine (PEPCID) 20 MG tablet   Oral   Take 1 tablet (20 mg total) by mouth 2 (  two) times daily.   8 tablet   0   . gabapentin (NEURONTIN) 300 MG capsule   Oral   Take 1 capsule (300 mg total) by mouth at bedtime. Take for 7 days then up to 1 capsule twice daily afterwards.   60 capsule   3   . insulin aspart (NOVOLOG) 100 UNIT/ML injection      Use on a sliding scale between 5-15 units with meals. Max daily 45 units   3 vial   PRN   . predniSONE (DELTASONE) 20 MG tablet      3 tablets daily 4 days   12 tablet   0   . tiZANidine (ZANAFLEX) 4 MG tablet   Oral   Take 1 tablet (4 mg total) by mouth every 6 (six) hours as needed for muscle spasms.   120 tablet   0     Allergies Nitroglycerin  Family History  Problem Relation Age of Onset  . Emphysema Father     + smoker  . COPD Father     Died age 85  . Coronary artery disease Maternal Grandfather   . Heart attack  Maternal Grandfather   . Diabetes Maternal Grandfather   . Hypertension Maternal Grandfather   . Hyperlipidemia Maternal Grandfather   . Heart disease Maternal Grandfather     CAD  . Lung cancer      paternal uncles and aunts (all smokers)  . Coronary artery disease      premature-family history    Social History Social History  Substance Use Topics  . Smoking status: Former Smoker    Quit date: 10/11/2004  . Smokeless tobacco: Former Neurosurgeon     Comment: used to smoke 1 1/2 ppd  . Alcohol Use: No     Comment: 1-2 drinks per month    Review of Systems Constitutional: No fever/chills Eyes: No visual changes. ENT: No sore throat. Cardiovascular: Denies chest pain. Respiratory: Denies shortness of breath. Gastrointestinal: no vomiting.  No diarrhea.  No constipation. Genitourinary: Negative for dysuria. Musculoskeletal:  Right-sided flank pain Skin: Negative for rash. Neurological: Negative for headaches, focal weakness or numbness.  10-point ROS otherwise negative.  ____________________________________________   PHYSICAL EXAM:  VITAL SIGNS: ED Triage Vitals  Enc Vitals Group     BP 12/08/15 0957 120/86 mmHg     Pulse Rate 12/08/15 0957 92     Resp 12/08/15 0957 18     Temp 12/08/15 0957 97.8 F (36.6 C)     Temp Source 12/08/15 0957 Oral     SpO2 12/08/15 0957 98 %     Weight 12/08/15 0957 235 lb (106.595 kg)     Height 12/08/15 0957 6' (1.829 m)     Head Cir --      Peak Flow --      Pain Score 12/08/15 0958 9     Pain Loc --      Pain Edu? --      Excl. in GC? --     Constitutional: Alert and oriented. Well appearing and in no acute distress. Eyes: Conjunctivae are normal. PERRL. EOMI. Head: Atraumatic. Nose: No congestion/rhinnorhea. Mouth/Throat: Mucous membranes are moist.  Oropharynx non-erythematous. Neck: No stridor.   Cardiovascular: Normal rate, regular rhythm. Grossly normal heart sounds.  Good peripheral circulation. Respiratory: Normal  respiratory effort.  No retractions. Lungs CTAB. Gastrointestinal: Soft with right lower quadrant tenderness to palpation. No rebound or guarding.. No distention. Right-sided CVA tenderness to palpation. Musculoskeletal: No lower extremity tenderness  nor edema.  No joint effusions. Neurologic:  Normal speech and language. No gross focal neurologic deficits are appreciated.  Skin:  Skin is warm, dry and intact. No rash noted. Psychiatric: Mood and affect are normal. Speech and behavior are normal.  ____________________________________________   LABS (all labs ordered are listed, but only abnormal results are displayed)  Labs Reviewed  COMPREHENSIVE METABOLIC PANEL - Abnormal; Notable for the following:    Glucose, Bld 351 (*)    All other components within normal limits  URINALYSIS COMPLETEWITH MICROSCOPIC ([facility] ONLY) - Abnormal; Notable for the following:    Color, Urine YELLOW (*)    APPearance CLEAR (*)    Glucose, UA >500 (*)    Specific Gravity, Urine 1.033 (*)    Hgb urine dipstick 1+ (*)    All other components within normal limits  LIPASE, BLOOD  CBC   ____________________________________________  EKG   ____________________________________________  RADIOLOGY  No acute intra-abdominal pathology. ____________________________________________   PROCEDURES    ____________________________________________   INITIAL IMPRESSION / ASSESSMENT AND PLAN / ED COURSE  Pertinent labs & imaging results that were available during my care of the patient were reviewed by me and considered in my medical decision making (see chart for details).  ----------------------------------------- 2:10 PM on 12/08/2015 -----------------------------------------  Patient resting without any acute distress at this time. He says his pain is about a 5 out of 10. I discussed with him the lab as well as imaging results. There were no acute issues found on the CAT scan of the abdomen and  pelvis. The appendix was visualized and normal. Patient says the pain isn't coming and going. He says he also is having some recent issues with stooling including moderately loose stools. He says the pain is coming and going in a cramping type quality but then when he "was doubled over this morning" that is what made him come in the hospital. I reexamined his abdomen and he is minimally tender to the right lower quadrant. There are no masses palpated. He says that this does not feel similar to when he had prostatitis. He says that the pain was directly between his legs which she is not experiencing at this time. He denied any heavy lifting or bending that could've caused this issue. He says that when he lifts his right leg it is painful. Possible musculoskeletal versus intra-abdominal issues such as spasm of the GI tract. We'll discharge with Zofran as well as Bentyl. Also counseled to try muscle cream such as icy hot or Aspercreme to the affected areas. He understands plan and is willing to comply. We also discussed return precautions such as any worsening or concerning symptoms such as worsening abdominal pain with nausea and vomiting. I also asked him again about chest pain or shortness of breath and he denies. He also denies any pain to the upper abdomen. ____________________________________________   FINAL CLINICAL IMPRESSION(S) / ED DIAGNOSES  Final diagnoses:  Right lower quadrant pain      Myrna Blazer, MD 12/08/15 1413

## 2016-01-12 ENCOUNTER — Telehealth: Payer: Self-pay | Admitting: Cardiovascular Disease

## 2016-01-12 NOTE — Telephone Encounter (Signed)
3 attempts to schedule from recall list.   Patient will call us when he is ready.  01[facility]12[facility]16 [facility]

## 2016-01-31 ENCOUNTER — Encounter: Payer: Self-pay | Admitting: Urgent Care

## 2016-01-31 ENCOUNTER — Emergency Department: Payer: Self-pay

## 2016-01-31 ENCOUNTER — Emergency Department
Admission: EM | Admit: 2016-01-31 | Discharge: 2016-01-31 | Disposition: A | Discharge status: Home / Self Care | Payer: Self-pay | Attending: Emergency Medicine | Admitting: Emergency Medicine

## 2016-01-31 DIAGNOSIS — I252 Old myocardial infarction: Secondary | ICD-10-CM | POA: Insufficient documentation

## 2016-01-31 DIAGNOSIS — F329 Major depressive disorder, single episode, unspecified: Secondary | ICD-10-CM | POA: Insufficient documentation

## 2016-01-31 DIAGNOSIS — E860 Dehydration: Secondary | ICD-10-CM | POA: Insufficient documentation

## 2016-01-31 DIAGNOSIS — R55 Syncope and collapse: Secondary | ICD-10-CM

## 2016-01-31 DIAGNOSIS — E669 Obesity, unspecified: Secondary | ICD-10-CM | POA: Insufficient documentation

## 2016-01-31 DIAGNOSIS — Z7982 Long term (current) use of aspirin: Secondary | ICD-10-CM | POA: Insufficient documentation

## 2016-01-31 DIAGNOSIS — Z951 Presence of aortocoronary bypass graft: Secondary | ICD-10-CM | POA: Insufficient documentation

## 2016-01-31 DIAGNOSIS — Z79899 Other long term (current) drug therapy: Secondary | ICD-10-CM | POA: Insufficient documentation

## 2016-01-31 DIAGNOSIS — Z794 Long term (current) use of insulin: Secondary | ICD-10-CM | POA: Insufficient documentation

## 2016-01-31 DIAGNOSIS — I10 Essential (primary) hypertension: Secondary | ICD-10-CM | POA: Insufficient documentation

## 2016-01-31 DIAGNOSIS — Z8673 Personal history of transient ischemic attack (TIA), and cerebral infarction without residual deficits: Secondary | ICD-10-CM | POA: Insufficient documentation

## 2016-01-31 DIAGNOSIS — Z87891 Personal history of nicotine dependence: Secondary | ICD-10-CM | POA: Insufficient documentation

## 2016-01-31 DIAGNOSIS — E119 Type 2 diabetes mellitus without complications: Secondary | ICD-10-CM | POA: Insufficient documentation

## 2016-01-31 DIAGNOSIS — E785 Hyperlipidemia, unspecified: Secondary | ICD-10-CM | POA: Insufficient documentation

## 2016-01-31 DIAGNOSIS — B349 Viral infection, unspecified: Secondary | ICD-10-CM | POA: Insufficient documentation

## 2016-01-31 LAB — CBC
HEMATOCRIT: 40 % (ref 40.0–52.0)
HEMOGLOBIN: 13.9 g/dL (ref 13.0–18.0)
MCH: 29.2 pg (ref 26.0–34.0)
MCHC: 34.7 g/dL (ref 32.0–36.0)
MCV: 84.2 fL (ref 80.0–100.0)
Platelets: 213 10*3/uL (ref 150–440)
RBC: 4.76 MIL/uL (ref 4.40–5.90)
RDW: 12.5 % (ref 11.5–14.5)
WBC: 8.1 10*3/uL (ref 3.8–10.6)

## 2016-01-31 LAB — URINALYSIS COMPLETE WITH MICROSCOPIC (ARMC ONLY)
BACTERIA UA: NONE SEEN
Bilirubin Urine: NEGATIVE
Glucose, UA: >500 mg/dL — AB
KETONES UR: NEGATIVE mg/dL
Leukocytes, UA: NEGATIVE
NITRITE: NEGATIVE
PH: 6 (ref 5.0–8.0)
PROTEIN: NEGATIVE mg/dL
SPECIFIC GRAVITY, URINE: 1.031 — AB (ref 1.005–1.030)
Squamous Epithelial / LPF: NONE SEEN

## 2016-01-31 LAB — RAPID INFLUENZA A&B ANTIGENS
Influenza A (ARMC): NEGATIVE
Influenza B (ARMC): NEGATIVE

## 2016-01-31 LAB — BASIC METABOLIC PANEL
ANION GAP: 8 (ref 5–15)
BUN: 12 mg/dL (ref 6–20)
CALCIUM: 8.5 mg/dL — AB (ref 8.9–10.3)
CO2: 22 mmol/L (ref 22–32)
Chloride: 106 mmol/L (ref 101–111)
Creatinine, Ser: 0.71 mg/dL (ref 0.61–1.24)
GLUCOSE: 283 mg/dL — AB (ref 65–99)
POTASSIUM: 3.8 mmol/L (ref 3.5–5.1)
Sodium: 136 mmol/L (ref 135–145)

## 2016-01-31 LAB — TROPONIN I

## 2016-01-31 LAB — GLUCOSE, CAPILLARY: Glucose-Capillary: 201 mg/dL — ABNORMAL HIGH (ref 65–99)

## 2016-01-31 LAB — CK: Total CK: 106 U/L (ref 49–397)

## 2016-01-31 LAB — MONONUCLEOSIS SCREEN: Mono Screen: NEGATIVE

## 2016-01-31 MED ORDER — OXYCODONE HCL 5 MG PO TABS
5.0000 mg | ORAL_TABLET | Freq: Once | ORAL | Status: AC
  Administered 2016-01-31: 5 mg via ORAL
  Filled 2016-01-31: qty 1

## 2016-01-31 MED ORDER — DOXYCYCLINE HYCLATE 100 MG PO CAPS: 100.0000 mg | ORAL_CAPSULE | Freq: Two times a day (BID) | ORAL | Status: DC

## 2016-01-31 MED ORDER — ONDANSETRON 4 MG PO TBDP: 4.0000 mg | ORAL_TABLET | Freq: Four times a day (QID) | ORAL | Status: DC | PRN

## 2016-01-31 MED ORDER — SODIUM CHLORIDE 0.9 % IV BOLUS (SEPSIS)
1000.0000 mL | Freq: Once | INTRAVENOUS | Status: AC
  Administered 2016-01-31: 1000 mL via INTRAVENOUS

## 2016-01-31 MED ORDER — OXYCODONE HCL 5 MG PO TABS: 5.0000 mg | ORAL_TABLET | Freq: Four times a day (QID) | ORAL | Status: DC | PRN

## 2016-01-31 MED ORDER — IBUPROFEN 600 MG PO TABS
600.0000 mg | ORAL_TABLET | Freq: Once | ORAL | Status: AC
  Administered 2016-01-31: 600 mg via ORAL
  Filled 2016-01-31: qty 1

## 2016-01-31 MED ORDER — SODIUM CHLORIDE 0.9 % IV BOLUS (SEPSIS)
1000.0000 mL | Freq: Once | INTRAVENOUS | Status: AC
Start: 2016-01-31 — End: 2016-01-31
  Administered 2016-01-31: 1000 mL via INTRAVENOUS

## 2016-01-31 MED ORDER — ONDANSETRON HCL 4 MG/2ML IJ SOLN
4.0000 mg | Freq: Once | INTRAMUSCULAR | Status: AC
  Administered 2016-01-31: 4 mg via INTRAVENOUS
  Filled 2016-01-31: qty 2

## 2016-01-31 NOTE — ED Notes (Signed)
Patient able to ambulate to nurses station and back to room without difficulty

## 2016-01-31 NOTE — ED Provider Notes (Addendum)
[facility] Emergency Department Provider Note  ____________________________________________  Time seen: Approximately 7:40 AM  I have reviewed the triage vital signs and the nursing notes.   HISTORY  Chief Complaint Loss of Consciousness; Fever; Cough; and Weakness    HPI [name] is a 35 y.o. male reports he's been feeling sick with chills, body aches, and a fever since Tuesday of last week.  He reports that these and feeling weak, fatigued, and dehydrated. He did try to keep up with fluids. Denies any nausea vomiting or abdominal pain. He reports a frequent dry cough, states really bad body aches. Denies headache or neck pain or stiffness.  This morning he was getting up to go to work and when he sat up on the bed and his wife witnessed him pass out, he sort of slid off the bed onto the floor and did not injure himself. He woke up oriented on the floor within a few moments, and there was no seizure.  He didn't taser 40s feels very weak, fatigued and has body aches everywhere.  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  He did receive his influenza vaccine this year. He reports his throat also feels scratchy and dry, but denies it being painful.  No chest pain. No shortness of breath.   Past Medical History  Diagnosis Date  . Coronary atherosclerosis of artery bypass graft     Age 42 - [place]  . Chest pain, unspecified   . Uncontrolled diabetes mellitus with complications (HCC) 2002    poorly controlled  . HLD (hyperlipidemia)     poorly controlled  . HTN (hypertension)   . Obesity, unspecified   . Hx-TIA (transient ischemic attack) 2009    possible-([facility]) Normal MRI of brain and MRA of the head and neck  . Noncompliance   . Kidney stone   . MI, old     Patient Active Problem List   Diagnosis Date Noted  . Chest pain 09/28/2015  . Non compliance with medical treatment 03/24/2015   . Left flank pain 03/24/2015  . Cervical radiculitis 09/04/2014  . Acute prostatitis 07/20/2014  . Diabetes mellitus type 2 with complications, uncontrolled (HCC) 02/13/2014  . Macular edema 02/13/2014  . HLD (hyperlipidemia) 11/08/2013  . Left shoulder pain 11/08/2013  . Financial difficulties 11/08/2013  . Depression with anxiety 11/08/2013  . Adjustment disorder with anxiety 10/26/2012  . Headache(784.0) 05/04/2011  . OBESITY 12/22/2010  . Atypical chest pain 03/05/2009  . Hyperlipidemia 03/03/2009  . Essential hypertension 03/03/2009  . CAD, ARTERY BYPASS GRAFT 03/03/2009    Past Surgical History  Procedure Laterality Date  . Coronary artery bypass graft  2007    x 4 (age 69)    Current Outpatient Rx  Name  Route  Sig  Dispense  Refill  . aspirin EC 81 MG tablet   Oral   Take 81 mg by mouth daily.         Marland Kitchen atorvastatin (LIPITOR) 80 MG tablet   Oral   Take 1 tablet (80 mg total) by mouth daily. Patient taking differently: Take 80 mg by mouth every morning.    90 tablet   3   . dicyclomine (BENTYL) 20 MG tablet   Oral   Take 1 tablet (20 mg total) by mouth 3 (three) times daily as needed for spasms.   30 tablet   0   . doxycycline (  VIBRAMYCIN) 100 MG capsule   Oral   Take 1 capsule (100 mg total) by mouth 2 (two) times daily.   20 capsule   0   . EPINEPHrine 0.3 mg/0.3 mL IJ SOAJ injection   Intramuscular   Inject 0.3 mLs (0.3 mg total) into the muscle once. Patient not taking: Reported on 12/08/2015   1 Device   0   . escitalopram (LEXAPRO) 10 MG tablet   Oral   Take 2 tablets (20 mg total) by mouth daily.   60 tablet   0   . famotidine (PEPCID) 20 MG tablet   Oral   Take 1 tablet (20 mg total) by mouth 2 (two) times daily.   8 tablet   0   . gabapentin (NEURONTIN) 300 MG capsule   Oral   Take 1 capsule (300 mg total) by mouth at bedtime. Take for 7 days then up to 1 capsule twice daily afterwards.   60 capsule   3   . insulin aspart  (NOVOLOG) 100 UNIT/ML injection      Use on a sliding scale between 5-15 units with meals. Max daily 45 units   3 vial   PRN   . Insulin Glargine (LANTUS SOLOSTAR) 100 UNIT/ML Solostar Pen   Subcutaneous   Inject 15 Units into the skin daily at 10 pm. Patient taking differently: Inject 50 Units into the skin 2 (two) times daily.    5 pen   3   . ondansetron (ZOFRAN) 4 MG tablet   Oral   Take 1 tablet (4 mg total) by mouth daily as needed.   10 tablet   0   . predniSONE (DELTASONE) 20 MG tablet      3 tablets daily 4 days   12 tablet   0   . tiZANidine (ZANAFLEX) 4 MG tablet   Oral   Take 1 tablet (4 mg total) by mouth every 6 (six) hours as needed for muscle spasms.   120 tablet   0     Allergies Nitroglycerin  Family History  Problem Relation Age of Onset  . Emphysema Father     + smoker  . COPD Father     Died age 57  . Coronary artery disease Maternal Grandfather   . Heart attack Maternal Grandfather   . Diabetes Maternal Grandfather   . Hypertension Maternal Grandfather   . Hyperlipidemia Maternal Grandfather   . Heart disease Maternal Grandfather     CAD  . Lung cancer      paternal uncles and aunts (all smokers)  . Coronary artery disease      premature-family history    Social History Social History  Substance Use Topics  . Smoking status: Former Smoker    Quit date: 10/11/2004  . Smokeless tobacco: Former Neurosurgeon     Comment: used to smoke 1 1/2 ppd  . Alcohol Use: No     Comment: 1-2 drinks per month    Review of Systems Constitutional: Fever to 100.2 this morning at home, body aches and chills, general weakness Eyes: No visual changes. ENT: See history of present illness. No trouble swallowing. Cardiovascular: Denies chest pain. Respiratory: Denies shortness of breath. See history of present illness Gastrointestinal: No abdominal pain.  No nausea, no vomiting.  No diarrhea.  No constipation. Genitourinary: Negative for  dysuria. Musculoskeletal: Negative for back pain she feels like every muscles aching. Skin: Negative for rash. Neurological: Negative for headaches, focal weakness or numbness.  10-point ROS otherwise  negative.  ____________________________________________   PHYSICAL EXAM:  VITAL SIGNS: ED Triage Vitals  Enc Vitals Group     BP 01/31/16 0627 125/79 mmHg     Pulse Rate 01/31/16 0627 104     Resp 01/31/16 0627 18     Temp 01/31/16 0627 98.4 F (36.9 C)     Temp Source 01/31/16 0627 Oral     SpO2 01/31/16 0627 99 %     Weight 01/31/16 0627 220 lb (99.791 kg)     Height 01/31/16 0627 6' (1.829 m)     Head Cir --      Peak Flow --      Pain Score 01/31/16 0628 6     Pain Loc --      Pain Edu? --      Excl. in GC? --    Constitutional: Alert and oriented. Well appearing and in no acute distress.Does appear slightly fatigued and with generalized illness. Eyes: Conjunctivae are just slightly injected. PERRL. EOMI. no photophobia. Head: Atraumatic. Nose: No congestion/rhinnorhea. Mouth/Throat: Mucous membranes are Dry.  Oropharynx non-erythematous. No tonsillar hypertrophy. Neck: No stridor.  No meningismus. No headache. Cardiovascular: Slightly tachycardic rate, regular rhythm. Grossly normal heart sounds.  Good peripheral circulation. Respiratory: Normal respiratory effort.  No retractions. Lungs CTAB. Full and clear sentences. Gastrointestinal: Soft and nontender. No distention.  No CVA tenderness. Musculoskeletal: No lower extremity tenderness nor edema.  No joint effusions. Neurologic:  Normal speech and language. No gross focal neurologic deficits are appreciated. No gait instability. Skin:  Skin is warm, dry and intact. No rash noted. Performed a fairly thorough skin exam and no rashes noted or tick. Psychiatric: Mood and affect are normal. Speech and behavior are normal.  ____________________________________________   LABS (all labs ordered are listed, but only abnormal  results are displayed)  Labs Reviewed  BASIC METABOLIC PANEL - Abnormal; Notable for the following:    Glucose, Bld 283 (*)    Calcium 8.5 (*)    All other components within normal limits  URINALYSIS COMPLETEWITH MICROSCOPIC ([facility] ONLY) - Abnormal; Notable for the following:    Color, Urine YELLOW (*)    APPearance CLEAR (*)    Glucose, UA >500 (*)    Specific Gravity, Urine 1.031 (*)    Hgb urine dipstick 2+ (*)    All other components within normal limits  GLUCOSE, CAPILLARY - Abnormal; Notable for the following:    Glucose-Capillary 201 (*)    All other components within normal limits  RAPID INFLUENZA A&B ANTIGENS ([facility] ONLY)  CBC  TROPONIN I  MONONUCLEOSIS SCREEN  CK  CBG MONITORING, ED   ____________________________________________  EKG  ED ECG REPORT I, Delorice Bannister, the attending physician, personally viewed and interpreted this ECG.  Date: 01/31/2016 EKG Time: 6:30 AM Rate: 105 Rhythm: Sinus tachycardia QRS Axis: normal Intervals: normal ST/T Wave abnormalities: normal Conduction Disturbances: none Narrative Interpretation: Sinus tachycardia. No ischemic changes.  ____________________________________________  RADIOLOGY  DG Chest 2 View (Final result) Result time: 01/31/16 [id number]:24   Final result by Rad Results In Interface (01/31/16 [id number]:24)   Narrative:   CLINICAL DATA: Coughing, Fever, Syncope X 1 week now. Former smoker. Previous CABG in 2007. Previous mini stroke per patient.  EXAM: CHEST 2 VIEW  COMPARISON: 09/28/2015  FINDINGS: Status post median sternotomy and CABG. The heart is normal in size. There are no focal consolidations or pleural effusions. No pulmonary edema. Visualized osseous structures have a normal appearance.  IMPRESSION: No active cardiopulmonary disease.  Electronically Signed By: Norva Pavlov M.D. On: 01/31/2016 07:06    ____________________________________________   PROCEDURES  Procedure(s)  performed: None  Critical Care performed: No  ____________________________________________   INITIAL IMPRESSION / ASSESSMENT AND PLAN / ED COURSE  Pertinent labs & imaging results that were available during my care of the patient were reviewed by me and considered in my medical decision making (see chart for details).  Patient denies myalgias, generalized illness for about a week. He reports that he is having a dry cough, myalgias, and feeling dehydrated. He denies any abdominal pain. He is not having any cardiac symptoms, particularly no chest pain given his history of coronary disease. He does have a dry cough, but denies shortness of breath. Overall he appears well, but fatigue and likely slightly dehydrated. He is a known diabetic, but his labs do not show a low CO2 and no ketones are noted in the urine. We will hydrate him generously, check labs including influenza, Monospot, chest x-ray, etc.  He does report a syncopal episode, but is normal neurologic status without injury. He has a history of coronary disease, but his symptoms today seem to suggest generalized illness, syncope likely from dehydration and he has not had any chest pain, palpitations or evidence to suggest a cardiac etiology. His EKG is reassuring. Troponin negative.  ----------------------------------------- 9:25 AM on 01/31/2016 -----------------------------------------  After receiving 2 L of fluid, patient now ambulating well without distress back and forth to the nursing desk. He reports feeling improvement. I'll provide him prescription for nausea, pain control, and he'll follow up closely with his physician.  I will treat him with doxycycline for a seven-day course as he works outdoors, and a lumbar yard, he could've potentially a tick exposure and given the springtime I cannot exclude [facility] spotted fever.  I will prescribe the patient a narcotic pain medicine due to their condition which I anticipate  will cause at least moderate pain short term. I discussed with the patient safe use of narcotic pain medicines, and that they are not to drive, work in dangerous areas, or ever take more than prescribed (no more than 1 pill every 6 hours). We discussed that this is the type of medication that can be  overdosed on and the risks of this type of medicine. Patient is very agreeable to only use as prescribed and to never use more than prescribed.  Return precautions and treatment recommendations and follow-up discussed with the patient who is agreeable with the plan. Wife driving.  ____________________________________________   FINAL CLINICAL IMPRESSION(S) / ED DIAGNOSES  Final diagnoses:  Viral syndrome  Dehydration  Syncope, non cardiac      Sharyn Creamer, MD 01/31/16 [id number]  Sharyn Creamer, MD 01/31/16 [phone]

## 2016-01-31 NOTE — ED Notes (Signed)
Patient presents with c/o being sick for several days. (+) cough and fever reported. Patient advising that he experienced a syncopal episode this morning; length of LOC unknown at this time. Patient stating that he cannot talk because of weakness.

## 2016-01-31 NOTE — ED Notes (Signed)
Patient has had cough and fever since Tuesday. This morning around 0530 patient went to the bathroom, when he came back to bed he was sitting on the edge of the bed and wife heard him fall. Patient does not remember if he hit his head. Patient c/o pain in the right side of his ribs. Per patient wife when patient fell he landed on the floor, did not hit anything that she knows of.

## 2016-01-31 NOTE — Discharge Instructions (Signed)
You have been seen today in the Emergency Department (ED)  for syncope (passing out).  Your workup including labs and EKG show reassuring results.  Your symptoms may be due to dehydration, so it is important that you drink plenty of non-alcoholic fluids.  Because of your line of work and a possible unknown tick exposure with your symptoms, we will treat you with doxycycline for 7 days. I suspect to have a virus, but given you work around [employer] on a regular basis and outdoors and [place] the [place] [facility] spotted fever is also notably high immediately should treat you for it in the event this is a possibility.  Please call your regular doctor as soon as possible to schedule the next available clinic appointment to follow up with him/her regarding your visit to the ED and your symptoms.  Return to the Emergency Department (ED)  if you have any further syncopal episodes (pass out again) or develop ANY chest pain, pressure, tightness, trouble breathing, sudden sweating, or other symptoms that concern you.    Viral Infections A viral infection can be caused by different types of viruses.Most viral infections are not serious and resolve on their own. However, some infections may cause severe symptoms and may lead to further complications. SYMPTOMS Viruses can frequently cause:  Minor sore throat.  Aches and pains.  Headaches.  Runny nose.  Different types of rashes.  Watery eyes.  Tiredness.  Cough.  Loss of appetite.  Gastrointestinal infections, resulting in nausea, vomiting, and diarrhea. These symptoms do not respond to antibiotics because the infection is not caused by bacteria. However, you might catch a bacterial infection following the viral infection. This is sometimes called a "superinfection." Symptoms of such a bacterial infection may include:  Worsening sore throat with pus and difficulty swallowing.  Swollen neck glands.  Chills  and a high or persistent fever.  Severe headache.  Tenderness over the sinuses.  Persistent overall ill feeling (malaise), muscle aches, and tiredness (fatigue).  Persistent cough.  Yellow, green, or brown mucus production with coughing. HOME CARE INSTRUCTIONS   Only take over-the-counter or prescription medicines for pain, discomfort, diarrhea, or fever as directed by your caregiver.  Drink enough water and fluids to keep your urine clear or pale yellow. Sports drinks can provide valuable electrolytes, sugars, and hydration.  Get plenty of rest and maintain proper nutrition. Soups and broths with crackers or rice are fine. SEEK IMMEDIATE MEDICAL CARE IF:   You have severe headaches, shortness of breath, chest pain, neck pain, or an unusual rash.  You have uncontrolled vomiting, diarrhea, or you are unable to keep down fluids.  You or your child has an oral temperature above 102 F (38.9 C), not controlled by medicine.  Your baby is older than 3 months with a rectal temperature of 102 F (38.9 C) or higher.  Your baby is 473 months old or younger with a rectal temperature of 100.4 F (38 C) or higher. MAKE SURE YOU:   Understand these instructions.  Will watch your condition.  Will get help right away if you are not doing well or get worse.   This information is not intended to replace advice given to you by your health care provider. Make sure you discuss any questions you have with your health care provider.   Document Released: 07/07/2005 Document Revised: 12/20/2011 Document Reviewed: 03/05/2015 Elsevier Interactive Patient Education [employer]2016 [employer]

## 2016-04-27 ENCOUNTER — Emergency Department: Payer: BLUE CROSS/BLUE SHIELD

## 2016-04-27 ENCOUNTER — Encounter: Payer: Self-pay | Admitting: Emergency Medicine

## 2016-04-27 ENCOUNTER — Emergency Department
Admission: EM | Admit: 2016-04-27 | Discharge: 2016-04-27 | Disposition: A | Discharge status: Home / Self Care | Payer: BLUE CROSS/BLUE SHIELD | Attending: Emergency Medicine | Admitting: Emergency Medicine

## 2016-04-27 DIAGNOSIS — I252 Old myocardial infarction: Secondary | ICD-10-CM | POA: Insufficient documentation

## 2016-04-27 DIAGNOSIS — Z794 Long term (current) use of insulin: Secondary | ICD-10-CM | POA: Diagnosis not present

## 2016-04-27 DIAGNOSIS — I25810 Atherosclerosis of coronary artery bypass graft(s) without angina pectoris: Secondary | ICD-10-CM | POA: Insufficient documentation

## 2016-04-27 DIAGNOSIS — Z79899 Other long term (current) drug therapy: Secondary | ICD-10-CM | POA: Insufficient documentation

## 2016-04-27 DIAGNOSIS — Z87891 Personal history of nicotine dependence: Secondary | ICD-10-CM | POA: Diagnosis not present

## 2016-04-27 DIAGNOSIS — E1165 Type 2 diabetes mellitus with hyperglycemia: Secondary | ICD-10-CM | POA: Insufficient documentation

## 2016-04-27 DIAGNOSIS — I10 Essential (primary) hypertension: Secondary | ICD-10-CM | POA: Insufficient documentation

## 2016-04-27 DIAGNOSIS — R0602 Shortness of breath: Secondary | ICD-10-CM

## 2016-04-27 DIAGNOSIS — E785 Hyperlipidemia, unspecified: Secondary | ICD-10-CM | POA: Diagnosis not present

## 2016-04-27 DIAGNOSIS — Z7982 Long term (current) use of aspirin: Secondary | ICD-10-CM | POA: Insufficient documentation

## 2016-04-27 DIAGNOSIS — R739 Hyperglycemia, unspecified: Secondary | ICD-10-CM

## 2016-04-27 LAB — CBC
HCT: 42.3 % (ref 40.0–52.0)
Hemoglobin: 15.1 g/dL (ref 13.0–18.0)
MCH: 30.1 pg (ref 26.0–34.0)
MCHC: 35.7 g/dL (ref 32.0–36.0)
MCV: 84.4 fL (ref 80.0–100.0)
PLATELETS: 219 10*3/uL (ref 150–440)
RBC: 5.01 MIL/uL (ref 4.40–5.90)
RDW: 12.9 % (ref 11.5–14.5)
WBC: 5.8 10*3/uL (ref 3.8–10.6)

## 2016-04-27 LAB — BASIC METABOLIC PANEL
Anion gap: 6 (ref 5–15)
BUN: 14 mg/dL (ref 6–20)
CO2: 25 mmol/L (ref 22–32)
Calcium: 8.7 mg/dL — ABNORMAL LOW (ref 8.9–10.3)
Chloride: 105 mmol/L (ref 101–111)
Creatinine, Ser: 0.74 mg/dL (ref 0.61–1.24)
GFR calc Af Amer: >60 mL/min (ref >60)
Glucose, Bld: 320 mg/dL — ABNORMAL HIGH (ref 65–99)
POTASSIUM: 3.9 mmol/L (ref 3.5–5.1)
SODIUM: 136 mmol/L (ref 135–145)

## 2016-04-27 LAB — GLUCOSE, CAPILLARY: GLUCOSE-CAPILLARY: 291 mg/dL — AB (ref 65–99)

## 2016-04-27 LAB — TROPONIN I

## 2016-04-27 MED ORDER — DIPHENHYDRAMINE HCL 25 MG PO CAPS: 25.0000 mg | ORAL_CAPSULE | ORAL | Status: DC | PRN

## 2016-04-27 MED ORDER — FAMOTIDINE IN NACL 20-0.9 MG/50ML-% IV SOLN
20.0000 mg | Freq: Once | INTRAVENOUS | Status: AC
  Administered 2016-04-27: 20 mg via INTRAVENOUS
  Filled 2016-04-27: qty 50

## 2016-04-27 MED ORDER — FAMOTIDINE 40 MG PO TABS: 40.0000 mg | ORAL_TABLET | Freq: Two times a day (BID) | ORAL | Status: DC

## 2016-04-27 MED ORDER — DIPHENHYDRAMINE HCL 50 MG/ML IJ SOLN
25.0000 mg | Freq: Once | INTRAMUSCULAR | Status: AC
  Administered 2016-04-27: 25 mg via INTRAVENOUS
  Filled 2016-04-27: qty 1

## 2016-04-27 MED ORDER — SODIUM CHLORIDE 0.9 % IV BOLUS (SEPSIS)
1000.0000 mL | Freq: Once | INTRAVENOUS | Status: AC
  Administered 2016-04-27: 1000 mL via INTRAVENOUS

## 2016-04-27 NOTE — ED Notes (Signed)
Patient transported to X-ray 

## 2016-04-27 NOTE — ED Provider Notes (Signed)
[facility] Emergency Department Provider Note  ____________________________________________  Time seen: Approximately 8:42 AM  I have reviewed the triage vital signs and the nursing notes.   HISTORY  Chief Complaint Shortness of Breath    HPI [name] is a 35 y.o. male with a history of CAD status post CABG 2007, HTN, HLD, TIA, and medication noncompliance presenting with blurred vision, chest pain and shortness of breath. 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 The patient denies any new foods, medications or detergents or soaps.  He also reports that he has not taken any of his medications in several days. He denies any rash, swelling of the lips, tongue or mouth, change in voice, drooling.   Past Medical History  Diagnosis Date  . Coronary atherosclerosis of artery bypass graft     Age 37 - [place]  . Chest pain, unspecified   . Uncontrolled diabetes mellitus with complications (HCC) 2002    poorly controlled  . HLD (hyperlipidemia)     poorly controlled  . HTN (hypertension)   . Obesity, unspecified   . Hx-TIA (transient ischemic attack) 2009    possible-([facility]) Normal MRI of brain and MRA of the head and neck  . Noncompliance   . Kidney stone   . MI, old     Patient Active Problem List   Diagnosis Date Noted  . Chest pain 09/28/2015  . Non compliance with medical treatment 03/24/2015  . Left flank pain 03/24/2015  . Cervical radiculitis 09/04/2014  . Acute prostatitis 07/20/2014  . Diabetes mellitus type 2 with complications, uncontrolled (HCC) 02/13/2014  . Macular edema 02/13/2014  . HLD (hyperlipidemia) 11/08/2013  . Left shoulder pain 11/08/2013  . Financial difficulties 11/08/2013  . Depression  with anxiety 11/08/2013  . Adjustment disorder with anxiety 10/26/2012  . Headache(784.0) 05/04/2011  . OBESITY 12/22/2010  . Atypical chest pain 03/05/2009  . Hyperlipidemia 03/03/2009  . Essential hypertension 03/03/2009  . CAD, ARTERY BYPASS GRAFT 03/03/2009    Past Surgical History  Procedure Laterality Date  . Coronary artery bypass graft  2007    x 4 (age 36)    Current Outpatient Rx  Name  Route  Sig  Dispense  Refill  . aspirin EC 81 MG tablet   Oral   Take 81 mg by mouth daily.         Marland Kitchen atorvastatin (LIPITOR) 80 MG tablet   Oral   Take 1 tablet (80 mg total) by mouth daily. Patient taking differently: Take 80 mg by mouth every morning.    90 tablet   3   . dicyclomine (BENTYL) 20 MG tablet   Oral   Take 1 tablet (20 mg total) by mouth 3 (three) times daily as needed for spasms.   30 tablet   0   . escitalopram (LEXAPRO) 10 MG tablet   Oral   Take 2 tablets (20 mg total) by mouth daily.   60 tablet   0   . gabapentin (NEURONTIN) 300 MG capsule   Oral   Take 1 capsule (300 mg total) by mouth at bedtime. Take for 7 days then up to 1 capsule twice daily afterwards.   60 capsule   3   .  insulin aspart (NOVOLOG) 100 UNIT/ML injection      Use on a sliding scale between 5-15 units with meals. Max daily 45 units   3 vial   PRN   . Insulin Glargine (LANTUS SOLOSTAR) 100 UNIT/ML Solostar Pen   Subcutaneous   Inject 15 Units into the skin daily at 10 pm. Patient taking differently: Inject 50 Units into the skin 2 (two) times daily.    5 pen   3   . tiZANidine (ZANAFLEX) 4 MG tablet   Oral   Take 1 tablet (4 mg total) by mouth every 6 (six) hours as needed for muscle spasms.   120 tablet   0   . diphenhydrAMINE (BENADRYL) 25 mg capsule   Oral   Take 1 capsule (25 mg total) by mouth every 4 (four) hours as needed.   15 capsule   0   . famotidine (PEPCID) 40 MG tablet   Oral   Take 1 tablet (40 mg total) by mouth 2 (two) times daily.   10  tablet   0     Allergies Nitroglycerin  Family History  Problem Relation Age of Onset  . Emphysema Father     + smoker  . COPD Father     Died age 25  . Coronary artery disease Maternal Grandfather   . Heart attack Maternal Grandfather   . Diabetes Maternal Grandfather   . Hypertension Maternal Grandfather   . Hyperlipidemia Maternal Grandfather   . Heart disease Maternal Grandfather     CAD  . Lung cancer      paternal uncles and aunts (all smokers)  . Coronary artery disease      premature-family history    Social History Social History  Substance Use Topics  . Smoking status: Former Smoker    Quit date: 10/11/2004  . Smokeless tobacco: Former Neurosurgeon     Comment: used to smoke 1 1/2 ppd  . Alcohol Use: No     Comment: 1-2 drinks per month    Review of Systems Constitutional: No fever/chills.No lightheadedness or syncope. Eyes: Positive bilateral blurred vision. ENT: No sore throat. No congestion or rhinorrhea. Cardiovascular: Denies chest pain. Positive pain with breathing. Denies palpitations. Respiratory: Positive shortness of breath.  No cough. Gastrointestinal: No abdominal pain.  No nausea, no vomiting.  No diarrhea.  No constipation. Genitourinary: Negative for dysuria. Musculoskeletal: Negative for back pain. No lower extremity swelling. No calf tenderness. Skin: Negative for rash. Neurological: Negative for headaches. No focal numbness, tingling or weakness.   10-point ROS otherwise negative.  ____________________________________________   PHYSICAL EXAM:  VITAL SIGNS: ED Triage Vitals  Enc Vitals Group     BP 04/27/16 0836 121/84 mmHg     Pulse Rate 04/27/16 0836 88     Resp 04/27/16 0836 22     Temp 04/27/16 0836 97.6 F (36.4 C)     Temp Source 04/27/16 0836 Oral     SpO2 04/27/16 0836 99 %     Weight 04/27/16 0836 233 lb (105.688 kg)     Height 04/27/16 0836 6\' 2"  (1.88 m)     Head Cir --      Peak Flow --      Pain Score 04/27/16 0840  0     Pain Loc --      Pain Edu? --      Excl. in GC? --     Constitutional: Alert and oriented. Well appearing and in no acute distress. Answers questions appropriately. Eyes:  Conjunctivae are normal.  EOMI. No scleral icterus. Head: Atraumatic. Nose: No congestion/rhinnorhea. Mouth/Throat: Mucous membranes are moist. No evidence of swelling over the lips, tongue. Posterior palate is symmetric without swelling. Uvula is midline. Neck: No stridor.  Supple.  No JVD. No meningismus. Cardiovascular: Normal rate, regular rhythm. No murmurs, rubs or gallops.  Respiratory: Normal respiratory effort.  No accessory muscle use or retractions. Lungs CTAB.  No wheezes, rales or ronchi. Gastrointestinal: Overweight. Soft, nontender and nondistended.  No guarding or rebound.  No peritoneal signs. Musculoskeletal: No LE edema. No ttp in the calves or palpable cords.  Negative Homan's sign. The right tibia has a 4 x 0.5" scabbed abrasion with less than 2 mm of surrounding erythema, no fluctuance or purulence. The patient does not have any evidence of an acute bite over the medial lower tibia in the area where he has discomfort. Normal DP and PT pulse on the right. Neurologic:  A&Ox3.  Speech is clear.  Face and smile are symmetric.  EOMI.  Moves all extremities well. Skin:  Skin is warm, dry. Abrasion as noted above. No rash noted. Psychiatric: Depressed mood and flat affect. Speech and behavior are normal.  Normal judgement.  ____________________________________________   LABS (all labs ordered are listed, but only abnormal results are displayed)  Labs Reviewed  BASIC METABOLIC PANEL - Abnormal; Notable for the following:    Glucose, Bld 320 (*)    Calcium 8.7 (*)    All other components within normal limits  GLUCOSE, CAPILLARY - Abnormal; Notable for the following:    Glucose-Capillary 291 (*)    All other components within normal limits  CBC  TROPONIN I  URINALYSIS COMPLETEWITH MICROSCOPIC  ([facility] ONLY)  CBG MONITORING, ED   ____________________________________________  EKG  ED ECG REPORT I, Rockne Menghini, the attending physician, personally viewed and interpreted this ECG.   Date: 04/27/2016  EKG Time: 1114  Rate: 76  Rhythm: normal sinus rhythm  Axis: normal  Intervals:none  ST&T Change: No STemi  ____________________________________________  RADIOLOGY  Dg Chest 2 View  04/27/2016  CLINICAL DATA:  Blurred vision and difficulty breathing since this morning. EXAM: CHEST  2 VIEW COMPARISON:  01/31/2016 FINDINGS: Sternotomy wires are unchanged. Lungs are hypoinflated and otherwise clear. Cardiomediastinal silhouette and remainder of the exam is unchanged. IMPRESSION: Hypoinflation without acute cardiopulmonary disease. Electronically Signed   By: Elberta Fortis M.D.   On: 04/27/2016 09:18    ____________________________________________   PROCEDURES  Procedure(s) performed: None  Critical Care performed: No ____________________________________________   INITIAL IMPRESSION / ASSESSMENT AND PLAN / ED COURSE  Pertinent labs & imaging results that were available during my care of the patient were reviewed by me and considered in my medical decision making (see chart for details).  35 y.o. male w/ no known allergies, multiple cardiac comorbidities, presenting with acute onset of blurred vision, chest pain and shortness of breath in the setting of not having taken any of his medications for several days. We will get a point-of-care blood sugar to evaluate for hypoglycemia. We'll get basic labs again for electrolyte disturbance, UTI, or hyperglycemia, rule out DKA. I do not see any evidence of acute anaphylaxis or allergic reaction at this time. Given the patient's sensation, eye will initiate antihistamines, but  steroids or epinephrine are not indicated at this time.  I am more concerned that he is having side effects from not taking his medications, rather than  an acute allergic reaction, but we will monitor the patient for  both.  ----------------------------------------- 8:51 AM on 04/27/2016 -----------------------------------------  The patient denied any allergies to me, but I do see in his chart review that on 1/17 he was seen in our emergency department for hives that may have been related to gabapentin use.  ----------------------------------------- 11:35 AM on 04/27/2016 -----------------------------------------  The patient's symptoms have significantly improved since his arrival. His workup in the emergency department is reassuring. He has a normal chest x-ray, and reassuring laboratory studies. His vital signs remained stable. He does have hyperglycemia without DKA. I will encourage him to restart all of his medications and make a close PMD follow-up appointment. Dareen Pianonderson return precautions as well as follow-up instructions. Given his hyperglycemia and affect that he did not have any allergic reaction symptoms on arrival to the emergency department, I will not initiate steroids as the risk-benefit is not in favor of starting steroids. He already has an EpiPen which was prescribed to him in January. Return precautions as well as follow-up instructions were discussed. ____________________________________________  FINAL CLINICAL IMPRESSION(S) / ED DIAGNOSES  Final diagnoses:  Hyperglycemia  Shortness of breath      NEW MEDICATIONS STARTED DURING THIS VISIT:  New Prescriptions   DIPHENHYDRAMINE (BENADRYL) 25 MG CAPSULE    Take 1 capsule (25 mg total) by mouth every 4 (four) hours as needed.   FAMOTIDINE (PEPCID) 40 MG TABLET    Take 1 tablet (40 mg total) by mouth 2 (two) times daily.     Rockne MenghiniAnne-Caroline Diem Dicocco, MD 04/27/16 1137

## 2016-04-27 NOTE — ED Notes (Signed)
Pt. States he felt he got bit or stung on his lower right leg and feels "heat" there

## 2016-04-27 NOTE — ED Notes (Signed)
Pt. Reports about 0800 this morning his vision became blurry and it felt as though he was "breathing through barbed wire", he feels that his throat is closing up. Pt. Reports only known allergy as nitroglycerin and is unaware of what caused his reaction of hives in the past

## 2016-04-27 NOTE — Discharge Instructions (Signed)
Today you had a possible allergic reaction so please take Pepcid for the next 5 days as prescribed. You may additionally take Benadryl if you develop hives or any other symptoms.  Given that you had no significant signs of allergic reaction here, and that you are to have an elevated blood sugar, we will not give you steroids today. Please use your EpiPen, which was prescribed to you in January, as needed but make sure you follow the instructions carefully.  Return to the emergency department for severe pain, lightheadedness, fainting, rash, or any other symptoms concerning to you. Please restart all of your medications as prescribed.

## 2016-06-28 ENCOUNTER — Emergency Department: Payer: BLUE CROSS/BLUE SHIELD

## 2016-06-28 ENCOUNTER — Encounter: Payer: Self-pay | Admitting: Medical Oncology

## 2016-06-28 ENCOUNTER — Emergency Department
Admission: EM | Admit: 2016-06-28 | Discharge: 2016-06-28 | Disposition: A | Discharge status: Home / Self Care | Payer: BLUE CROSS/BLUE SHIELD | Attending: Emergency Medicine | Admitting: Emergency Medicine

## 2016-06-28 DIAGNOSIS — Z794 Long term (current) use of insulin: Secondary | ICD-10-CM | POA: Diagnosis not present

## 2016-06-28 DIAGNOSIS — R310 Gross hematuria: Secondary | ICD-10-CM

## 2016-06-28 DIAGNOSIS — M545 Low back pain: Secondary | ICD-10-CM | POA: Insufficient documentation

## 2016-06-28 DIAGNOSIS — I10 Essential (primary) hypertension: Secondary | ICD-10-CM | POA: Insufficient documentation

## 2016-06-28 DIAGNOSIS — Z79899 Other long term (current) drug therapy: Secondary | ICD-10-CM | POA: Diagnosis not present

## 2016-06-28 DIAGNOSIS — I252 Old myocardial infarction: Secondary | ICD-10-CM | POA: Insufficient documentation

## 2016-06-28 DIAGNOSIS — R109 Unspecified abdominal pain: Secondary | ICD-10-CM | POA: Insufficient documentation

## 2016-06-28 DIAGNOSIS — Z87891 Personal history of nicotine dependence: Secondary | ICD-10-CM | POA: Diagnosis not present

## 2016-06-28 DIAGNOSIS — I2510 Atherosclerotic heart disease of native coronary artery without angina pectoris: Secondary | ICD-10-CM | POA: Diagnosis not present

## 2016-06-28 DIAGNOSIS — E119 Type 2 diabetes mellitus without complications: Secondary | ICD-10-CM | POA: Insufficient documentation

## 2016-06-28 DIAGNOSIS — Z7982 Long term (current) use of aspirin: Secondary | ICD-10-CM | POA: Diagnosis not present

## 2016-06-28 LAB — URINALYSIS COMPLETE WITH MICROSCOPIC (ARMC ONLY)
BACTERIA UA: NONE SEEN
Bilirubin Urine: NEGATIVE
Glucose, UA: >500 mg/dL — AB
Ketones, ur: NEGATIVE mg/dL
Leukocytes, UA: NEGATIVE
NITRITE: NEGATIVE
PROTEIN: NEGATIVE mg/dL
Specific Gravity, Urine: 1.039 — ABNORMAL HIGH (ref 1.005–1.030)
pH: 5 (ref 5.0–8.0)

## 2016-06-28 MED ORDER — OXYCODONE-ACETAMINOPHEN 5-325 MG PO TABS
2.0000 | ORAL_TABLET | Freq: Once | ORAL | Status: AC
  Administered 2016-06-28: 2 via ORAL
  Filled 2016-06-28: qty 2

## 2016-06-28 MED ORDER — NAPROXEN 500 MG PO TABS: 500.0000 mg | ORAL_TABLET | Freq: Two times a day (BID) | ORAL | 0 refills | Status: DC

## 2016-06-28 MED ORDER — METHOCARBAMOL 750 MG PO TABS: 750.0000 mg | ORAL_TABLET | Freq: Four times a day (QID) | ORAL | 0 refills | Status: DC

## 2016-06-28 MED ORDER — SULFAMETHOXAZOLE-TRIMETHOPRIM 800-160 MG PO TABS: 1.0000 | ORAL_TABLET | Freq: Two times a day (BID) | ORAL | 0 refills | Status: DC

## 2016-06-28 NOTE — ED Notes (Signed)
Patient transported to CT 

## 2016-06-28 NOTE — ED Triage Notes (Signed)
Pt reports left lower back pain that began this am without injury, pain radiates down left leg.

## 2016-06-28 NOTE — ED Notes (Signed)
States he woke up with pain to lower back pain which moves into left leg  Denies any injury ambulates well to treatment room

## 2016-06-28 NOTE — ED Provider Notes (Signed)
[facility] Emergency Department Provider Note  ____________________________________________  Time seen: Approximately 11:50 AM  I have reviewed the triage vital signs and the nursing notes.   HISTORY  Chief Complaint Back Pain    HPI [name] is a 35 y.o. male since was sudden onset left lower back pain that began this morning without injury. Patient reports pain radiates down his left leg with some tingling sensation. Denies any numbness or tingling or paresthesia within itself. Past medical history significant for kidney stones. He presently denies any urinary symptoms.   Past Medical History:  Diagnosis Date  . Chest pain, unspecified   . Coronary atherosclerosis of artery bypass graft    Age 35 - [place]  . HLD (hyperlipidemia)    poorly controlled  . HTN (hypertension)   . Hx-TIA (transient ischemic attack) 2009   possible-([facility]) Normal MRI of brain and MRA of the head and neck  . Kidney stone   . MI, old   . Noncompliance   . Obesity, unspecified   . Uncontrolled diabetes mellitus with complications (HCC) 2002   poorly controlled    Patient Active Problem List   Diagnosis Date Noted  . Chest pain 09/28/2015  . Non compliance with medical treatment 03/24/2015  . Left flank pain 03/24/2015  . Cervical radiculitis 09/04/2014  . Acute prostatitis 07/20/2014  . Diabetes mellitus type 2 with complications, uncontrolled (HCC) 02/13/2014  . Macular edema 02/13/2014  . HLD (hyperlipidemia) 11/08/2013  . Left shoulder pain 11/08/2013  . Financial difficulties 11/08/2013  . Depression with anxiety 11/08/2013  . Adjustment disorder with anxiety 10/26/2012  . Headache(784.0) 05/04/2011  . OBESITY 12/22/2010  . Atypical chest pain 03/05/2009  . Hyperlipidemia 03/03/2009  . Essential hypertension 03/03/2009  . CAD, ARTERY BYPASS GRAFT 03/03/2009    Past Surgical History:  Procedure Laterality Date  . CORONARY ARTERY BYPASS GRAFT   2007   x 4 (age 35)    Prior to Admission medications   Medication Sig Start Date End Date Taking? Authorizing Provider  aspirin EC 81 MG tablet Take 81 mg by mouth daily.    Historical Provider, MD  atorvastatin (LIPITOR) 80 MG tablet Take 1 tablet (80 mg total) by mouth daily. Patient taking differently: Take 80 mg by mouth every morning.  05/07/15   Antonieta Ibaimothy J Gollan, MD  escitalopram (LEXAPRO) 10 MG tablet Take 2 tablets (20 mg total) by mouth daily. 09/29/15   Gale Journeyatherine P Walsh, MD  famotidine (PEPCID) 40 MG tablet Take 1 tablet (40 mg total) by mouth 2 (two) times daily. 04/27/16 04/27/17  Rockne MenghiniAnne-Caroline Norman, MD  gabapentin (NEURONTIN) 300 MG capsule Take 1 capsule (300 mg total) by mouth at bedtime. Take for 7 days then up to 1 capsule twice daily afterwards. 08/12/15   Carollee Leitzarrie M Doss, NP  insulin aspart (NOVOLOG) 100 UNIT/ML injection Use on a sliding scale between 5-15 units with meals. Max daily 45 units 10/03/15   Carollee Leitzarrie M Doss, NP  Insulin Glargine (LANTUS SOLOSTAR) 100 UNIT/ML Solostar Pen Inject 15 Units into the skin daily at 10 pm. Patient taking differently: Inject 50 Units into the skin 2 (two) times daily.  12/25/14   Quentin Cornwalladhika P Phadke, MD  methocarbamol (ROBAXIN) 750 MG tablet Take 1 tablet (750 mg total) by mouth 4 (four) times daily. 06/28/16   Evangeline Dakinharles M Beers, PA-C  naproxen (NAPROSYN) 500 MG tablet Take 1 tablet (500 mg total) by mouth 2 (two) times daily with a meal. 06/28/16  Charmayne Sheer Beers, PA-C  sulfamethoxazole-trimethoprim (BACTRIM DS,SEPTRA DS) 800-160 MG tablet Take 1 tablet by mouth 2 (two) times daily. 06/28/16   Evangeline Dakin, PA-C    Allergies Nitroglycerin  Family History  Problem Relation Age of Onset  . Emphysema Father     + smoker  . COPD Father     Died age 45  . Coronary artery disease Maternal Grandfather   . Heart attack Maternal Grandfather   . Diabetes Maternal Grandfather   . Hypertension Maternal Grandfather   . Hyperlipidemia Maternal  Grandfather   . Heart disease Maternal Grandfather     CAD  . Lung cancer      paternal uncles and aunts (all smokers)  . Coronary artery disease      premature-family history    Social History Social History  Substance Use Topics  . Smoking status: Former Smoker    Quit date: 10/11/2004  . Smokeless tobacco: Former Neurosurgeon     Comment: used to smoke 1 1/2 ppd  . Alcohol use No     Comment: 1-2 drinks per month    Review of Systems Constitutional: No fever/chills Gastrointestinal: No abdominal pain.  No nausea, no vomiting.  No diarrhea.  No constipation. Genitourinary: Negative for dysuria. Musculoskeletal: Positive for lower back pain. Skin: Negative for rash. Neurological: Negative for headaches, focal weakness or numbness. Denies any numbness or tingling within the groin.  10-point ROS otherwise negative.  ____________________________________________   PHYSICAL EXAM:  VITAL SIGNS: ED Triage Vitals  Enc Vitals Group     BP 06/28/16 1116 132/86     Pulse Rate 06/28/16 1116 84     Resp 06/28/16 1116 18     Temp 06/28/16 1116 97.4 F (36.3 C)     Temp Source 06/28/16 1116 Oral     SpO2 06/28/16 1116 97 %     Weight 06/28/16 1115 215 lb (97.5 kg)     Height 06/28/16 1115 6' (1.829 m)     Head Circumference --      Peak Flow --      Pain Score 06/28/16 1115 10     Pain Loc --      Pain Edu? --      Excl. in GC? --     Constitutional: Alert and oriented. Well appearing and in no acute distress. Neck: No stridor.   Cardiovascular: Normal rate, regular rhythm. Grossly normal heart sounds.  Good peripheral circulation. Respiratory: Normal respiratory effort.  No retractions. Lungs CTAB. Gastrointestinal: Soft and nontender. No distention. No abdominal bruits. Positive left CVA tenderness. Musculoskeletal: Positive left flank tenderness. Straight leg raise unremarkable. Neurologic:  Normal speech and language. No gross focal neurologic deficits are appreciated. No  gait instability. Skin:  Skin is warm, dry and intact. No rash noted. Psychiatric: Mood and affect are normal. Speech and behavior are normal.  ____________________________________________   LABS (all labs ordered are listed, but only abnormal results are displayed)  Labs Reviewed  URINALYSIS COMPLETEWITH MICROSCOPIC ([facility] ONLY) - Abnormal; Notable for the following:       Result Value   Color, Urine YELLOW (*)    APPearance CLEAR (*)    Glucose, UA >500 (*)    Specific Gravity, Urine 1.039 (*)    Hgb urine dipstick 1+ (*)    Squamous Epithelial / LPF 0-5 (*)    All other components within normal limits   ____________________________________________  EKG   ____________________________________________  RADIOLOGY  No evidence of renal calculi. ____________________________________________  PROCEDURES  Procedure(s) performed: None  Critical Care performed: No  ____________________________________________   INITIAL IMPRESSION / ASSESSMENT AND PLAN / ED COURSE  Pertinent labs & imaging results that were available during my care of the patient were reviewed by me and considered in my medical decision making (see chart for details). Review of the [place] CSRS was performed in accordance of the [facility] prior to dispensing any controlled drugs.  Hematuria with acute lumbosacral strain. Rx given for Naprosyn, TMP SMX and Robaxin. Patient PCP or return to ER with any worsening symptomology.  Clinical Course    ____________________________________________   FINAL CLINICAL IMPRESSION(S) / ED DIAGNOSES  Final diagnoses:  Low back pain without sciatica, unspecified back pain laterality  Hematuria, gross     This chart was dictated using voice recognition software/Dragon. Despite best efforts to proofread, errors can occur which can change the meaning. Any change was purely unintentional.    Evangeline Dakin, PA-C 06/28/16 1333    Minna Antis, MD 06/28/16  [phone]

## 2016-07-06 ENCOUNTER — Telehealth: Payer: Self-pay | Admitting: General Practice

## 2016-07-06 NOTE — Telephone Encounter (Signed)
Need a medical record release if they are not apart of our organization

## 2016-07-06 NOTE — Telephone Encounter (Signed)
[facility]. Patient is in their office being seen. They would like patient's last notes to be faxed over. Thank you!  Fax [phone]

## 2016-10-19 ENCOUNTER — Emergency Department: Payer: BLUE CROSS/BLUE SHIELD

## 2016-10-19 ENCOUNTER — Encounter: Payer: Self-pay | Admitting: Emergency Medicine

## 2016-10-19 ENCOUNTER — Emergency Department
Admission: EM | Admit: 2016-10-19 | Discharge: 2016-10-19 | Disposition: A | Discharge status: Home / Self Care | Payer: BLUE CROSS/BLUE SHIELD | Attending: Emergency Medicine | Admitting: Emergency Medicine

## 2016-10-19 DIAGNOSIS — X500XXA Overexertion from strenuous movement or load, initial encounter: Secondary | ICD-10-CM | POA: Diagnosis not present

## 2016-10-19 DIAGNOSIS — Z7982 Long term (current) use of aspirin: Secondary | ICD-10-CM | POA: Diagnosis not present

## 2016-10-19 DIAGNOSIS — Z79899 Other long term (current) drug therapy: Secondary | ICD-10-CM | POA: Diagnosis not present

## 2016-10-19 DIAGNOSIS — R0789 Other chest pain: Secondary | ICD-10-CM | POA: Insufficient documentation

## 2016-10-19 DIAGNOSIS — Y93F2 Activity, caregiving, lifting: Secondary | ICD-10-CM | POA: Insufficient documentation

## 2016-10-19 DIAGNOSIS — Y990 Civilian activity done for income or pay: Secondary | ICD-10-CM | POA: Insufficient documentation

## 2016-10-19 DIAGNOSIS — E119 Type 2 diabetes mellitus without complications: Secondary | ICD-10-CM | POA: Diagnosis not present

## 2016-10-19 DIAGNOSIS — Y9289 Other specified places as the place of occurrence of the external cause: Secondary | ICD-10-CM | POA: Insufficient documentation

## 2016-10-19 DIAGNOSIS — I10 Essential (primary) hypertension: Secondary | ICD-10-CM | POA: Insufficient documentation

## 2016-10-19 DIAGNOSIS — S46312A Strain of muscle, fascia and tendon of triceps, left arm, initial encounter: Secondary | ICD-10-CM | POA: Insufficient documentation

## 2016-10-19 DIAGNOSIS — S59912A Unspecified injury of left forearm, initial encounter: Secondary | ICD-10-CM | POA: Diagnosis present

## 2016-10-19 DIAGNOSIS — R079 Chest pain, unspecified: Secondary | ICD-10-CM

## 2016-10-19 DIAGNOSIS — Z87891 Personal history of nicotine dependence: Secondary | ICD-10-CM | POA: Diagnosis not present

## 2016-10-19 LAB — BASIC METABOLIC PANEL
Anion gap: 7 (ref 5–15)
BUN: 19 mg/dL (ref 6–20)
CHLORIDE: 101 mmol/L (ref 101–111)
CO2: 26 mmol/L (ref 22–32)
CREATININE: 0.96 mg/dL (ref 0.61–1.24)
Calcium: 9.2 mg/dL (ref 8.9–10.3)
GFR calc non Af Amer: >60 mL/min (ref >60)
Glucose, Bld: 367 mg/dL — ABNORMAL HIGH (ref 65–99)
Potassium: 4.7 mmol/L (ref 3.5–5.1)
SODIUM: 134 mmol/L — AB (ref 135–145)

## 2016-10-19 LAB — TROPONIN I

## 2016-10-19 LAB — CBC
HCT: 43.9 % (ref 40.0–52.0)
Hemoglobin: 15.2 g/dL (ref 13.0–18.0)
MCH: 29.4 pg (ref 26.0–34.0)
MCHC: 34.6 g/dL (ref 32.0–36.0)
MCV: 85 fL (ref 80.0–100.0)
PLATELETS: 285 10*3/uL (ref 150–440)
RBC: 5.16 MIL/uL (ref 4.40–5.90)
RDW: 12.5 % (ref 11.5–14.5)
WBC: 7.1 10*3/uL (ref 3.8–10.6)

## 2016-10-19 MED ORDER — METHOCARBAMOL 500 MG PO TABS: 500.0000 mg | ORAL_TABLET | Freq: Three times a day (TID) | ORAL | 0 refills | Status: DC | PRN

## 2016-10-19 MED ORDER — KETOROLAC TROMETHAMINE 60 MG/2ML IM SOLN
15.0000 mg | Freq: Once | INTRAMUSCULAR | Status: AC
  Administered 2016-10-19: 15 mg via INTRAMUSCULAR
  Filled 2016-10-19: qty 2

## 2016-10-19 MED ORDER — NAPROXEN 500 MG PO TABS: 500.0000 mg | ORAL_TABLET | Freq: Two times a day (BID) | ORAL | 0 refills | Status: DC

## 2016-10-19 NOTE — ED Triage Notes (Addendum)
Patient presents to the ED with heaviness/pressure in his chest radiating into shoulder blades and left arm since yesterday evening.  Patient reports left arm feels, "like a lead weight."  Patient reports feeling dizzy and nauseous.  Patient reports history of open heart surgery and cardiac bypass with hypertension and diabetes.

## 2016-10-19 NOTE — ED Provider Notes (Signed)
[facility] Emergency Department Provider Note  ____________________________________________  Time seen: Approximately 2:19 PM  I have reviewed the triage vital signs and the nursing notes.   HISTORY  Chief Complaint Chest Pain    HPI [name] is a 36 y.o. male who complains of left arm pain for the past week, hurts when he moves the arm around. Arm feels heavy and fatigued. The patient works at a lumber yard, for going moving and lifting heavy objects. He states that he does not get any chest pain when he does this strenuous activity but it does make his arm hurt worse. He is also having left neck pain and tightness. With this over the last 24 hours he's been having intermittent episodes of dull aching pain in the left neck and shoulder and anterior chest.  Chest pain is not exertional, not pleuritic. No dizziness or syncope. No sudden tearing severe pain. No recent trauma.     Past Medical History:  Diagnosis Date  . Chest pain, unspecified   . Coronary atherosclerosis of artery bypass graft    Age 36 - [place]  . HLD (hyperlipidemia)    poorly controlled  . HTN (hypertension)   . Hx-TIA (transient ischemic attack) 2009   possible-([facility]) Normal MRI of brain and MRA of the head and neck  . Kidney stone   . MI, old   . Noncompliance   . Obesity, unspecified   . Uncontrolled diabetes mellitus with complications (HCC) 2002   poorly controlled     Patient Active Problem List   Diagnosis Date Noted  . Chest pain 09/28/2015  . Non compliance with medical treatment 03/24/2015  . Left flank pain 03/24/2015  . Cervical radiculitis 09/04/2014  . Acute prostatitis 07/20/2014  . Diabetes mellitus type 2 with complications, uncontrolled (HCC) 02/13/2014  . Macular edema 02/13/2014  . HLD (hyperlipidemia) 11/08/2013  . Left shoulder pain 11/08/2013  . Financial difficulties 11/08/2013  . Depression with anxiety 11/08/2013  . Adjustment  disorder with anxiety 10/26/2012  . Headache(784.0) 05/04/2011  . OBESITY 12/22/2010  . Atypical chest pain 03/05/2009  . Hyperlipidemia 03/03/2009  . Essential hypertension 03/03/2009  . CAD, ARTERY BYPASS GRAFT 03/03/2009     Past Surgical History:  Procedure Laterality Date  . CORONARY ARTERY BYPASS GRAFT  2007   x 4 (age 36)     Prior to Admission medications   Medication Sig Start Date End Date Taking? Authorizing Provider  aspirin EC 81 MG tablet Take 81 mg by mouth daily.   Yes Historical Provider, MD  atorvastatin (LIPITOR) 80 MG tablet Take 1 tablet (80 mg total) by mouth daily. Patient not taking: Reported on 10/19/2016 05/07/15   Antonieta Ibaimothy J Gollan, MD  escitalopram (LEXAPRO) 10 MG tablet Take 2 tablets (20 mg total) by mouth daily. Patient not taking: Reported on 10/19/2016 09/29/15   Gale Journeyatherine P Walsh, MD  famotidine (PEPCID) 40 MG tablet Take 1 tablet (40 mg total) by mouth 2 (two) times daily. Patient not taking: Reported on 10/19/2016 04/27/16 04/27/17  Rockne MenghiniAnne-Caroline Norman, MD  gabapentin (NEURONTIN) 300 MG capsule Take 1 capsule (300 mg total) by mouth at bedtime. Take for 7 days then up to 1 capsule twice daily afterwards. 08/12/15   Carollee Leitzarrie M Doss, NP  insulin aspart (NOVOLOG) 100 UNIT/ML injection Use on a sliding scale between 5-15 units with meals. Max daily 45 units Patient not taking: Reported on 10/19/2016 10/03/15   Carollee Leitzarrie M Doss, NP  Insulin Glargine (LANTUS  SOLOSTAR) 100 UNIT/ML Solostar Pen Inject 15 Units into the skin daily at 10 pm. Patient not taking: Reported on 10/19/2016 12/25/14   Radhika P Phadke, MD  metFORMIN (GLUCOPHAGE) 500 MG tablet Take by mouth 2 (two) times daily with a meal.    Historical Provider, MD  methocarbamol (ROBAXIN) 500 MG tablet Take 1 tablet (500 mg total) by mouth every 8 (eight) hours as needed for muscle spasms. 10/19/16   Sharman Cheek, MD  naproxen (NAPROSYN) 500 MG tablet Take 1 tablet (500 mg total) by mouth 2 (two) times daily with a  meal. 10/19/16   Sharman Cheek, MD     Allergies Nitroglycerin   Family History  Problem Relation Age of Onset  . Emphysema Father     + smoker  . COPD Father     Died age 84  . Coronary artery disease Maternal Grandfather   . Heart attack Maternal Grandfather   . Diabetes Maternal Grandfather   . Hypertension Maternal Grandfather   . Hyperlipidemia Maternal Grandfather   . Heart disease Maternal Grandfather     CAD  . Lung cancer      paternal uncles and aunts (all smokers)  . Coronary artery disease      premature-family history    Social History Social History  Substance Use Topics  . Smoking status: Former Smoker    Quit date: 10/11/2004  . Smokeless tobacco: Former Neurosurgeon     Comment: used to smoke 1 1/2 ppd  . Alcohol use No     Comment: 1-2 drinks per month    Review of Systems  Constitutional:   No fever or chills.  ENT:   No sore throat. No rhinorrhea. Cardiovascular:   Positive as above chest pain. Respiratory:   No dyspnea or cough. Gastrointestinal:   Negative for abdominal pain, vomiting and diarrhea.  Genitourinary:   Negative for dysuria or difficulty urinating. Musculoskeletal:   Positive left neck and left arm pain Neurological:   Negative for headaches 10-point ROS otherwise negative.  ____________________________________________   PHYSICAL EXAM:  VITAL SIGNS: ED Triage Vitals  Enc Vitals Group     BP 10/19/16 0902 116/82     Pulse Rate 10/19/16 0902 93     Resp 10/19/16 0902 20     Temp 10/19/16 0902 97.7 F (36.5 C)     Temp Source 10/19/16 0902 Oral     SpO2 10/19/16 0902 99 %     Weight 10/19/16 0902 230 lb (104.3 kg)     Height 10/19/16 0902 5\' 11"  (1.803 m)     Head Circumference --      Peak Flow --      Pain Score 10/19/16 0904 10     Pain Loc --      Pain Edu? --      Excl. in GC? --     Vital signs reviewed, nursing assessments reviewed.   Constitutional:   Alert and oriented. Well appearing and in no  distress. Eyes:   No scleral icterus. No conjunctival pallor. PERRL. EOMI.  No nystagmus. ENT   Head:   Normocephalic and atraumatic.   Nose:   No congestion/rhinnorhea. No septal hematoma   Mouth/Throat:   MMM, no pharyngeal erythema. No peritonsillar mass.    Neck:   No stridor. No SubQ emphysema. No meningismus.No midline spinal tenderness. There is tenseness and tenderness along the left SCM and left trapezius which does reproduce his neck pain Hematological/Lymphatic/Immunilogical:   No cervical lymphadenopathy. Cardiovascular:  RRR. Symmetric bilateral radial and DP pulses.  No murmurs.  Respiratory:   Normal respiratory effort without tachypnea nor retractions. Breath sounds are clear and equal bilaterally. No wheezes/rales/rhonchi. Chest wall nontender Gastrointestinal:   Soft and nontender. Non distended. There is no CVA tenderness.  No rebound, rigidity, or guarding. Genitourinary:   deferred Musculoskeletal:  normal range of motion in all extremities. No joint effusions.  No lower extremity tenderness.  No edema. There is severe tenderness to the touch along the left triceps with focal tenderness along the distal humerus. There is intact biceps and triceps function. No abnormal bulge or displacement in the muscle belly. Neurologic:   Normal speech and language.  CN 2-10 normal. Motor grossly intact. No gross focal neurologic deficits are appreciated.  Skin:    Skin is warm, dry and intact. No rash noted.  No petechiae, purpura, or bullae.  ____________________________________________    LABS (pertinent positives/negatives) (all labs ordered are listed, but only abnormal results are displayed) Labs Reviewed  BASIC METABOLIC PANEL - Abnormal; Notable for the following:       Result Value   Sodium 134 (*)    Glucose, Bld 367 (*)    All other components within normal limits  CBC  TROPONIN I  TROPONIN I    ____________________________________________   EKG  Interpreted by me Normal sinus rhythm rate of 96, normal axis and intervals. Normal QRS ST segments and T waves.  ____________________________________________    RADIOLOGY  Chest x-ray unremarkable X-ray left humerus unremarkable  ____________________________________________   PROCEDURES Procedures  ____________________________________________   INITIAL IMPRESSION / ASSESSMENT AND PLAN / ED COURSE  Pertinent labs & imaging results that were available during my care of the patient were reviewed by me and considered in my medical decision making (see chart for details).  Patient presents with left arm pain for a week and atypical chest pain for a day. He does have a history of hypertension diabetes and myocardial infarction status post CABG. However, despite this medical history, his chest pain is not consistent with ACS PE dissection pneumothorax pericarditis or pneumonia. Examined entirely consistent with musculoskeletal pain due to muscle strains in the trapezius and cervical and mastoid muscles of the left neck and in the triceps muscle of the left arm. We'll treat with NSAIDs. I'm obtaining a second troponin for further risk stratification given his medical history, but do not feel that additional workup is necessary at this time. Hospitalization is not warranted. We'll plan to discharge home to follow up with primary care.    ----------------------------------------- 3:18 PM on 10/19/2016 -----------------------------------------  #2 negative.   Clinical Course    ____________________________________________   FINAL CLINICAL IMPRESSION(S) / ED DIAGNOSES  Final diagnoses:  Nonspecific chest pain  Triceps strain, left, initial encounter      New Prescriptions   METHOCARBAMOL (ROBAXIN) 500 MG TABLET    Take 1 tablet (500 mg total) by mouth every 8 (eight) hours as needed for muscle spasms.   NAPROXEN  (NAPROSYN) 500 MG TABLET    Take 1 tablet (500 mg total) by mouth 2 (two) times daily with a meal.     Portions of this note were generated with dragon dictation software. Dictation errors may occur despite best attempts at proofreading.    Sharman Cheek, MD 10/19/16 [phone]

## 2017-02-17 ENCOUNTER — Emergency Department: Payer: BLUE CROSS/BLUE SHIELD

## 2017-02-17 ENCOUNTER — Encounter: Payer: Self-pay | Admitting: Emergency Medicine

## 2017-02-17 ENCOUNTER — Emergency Department
Admission: EM | Admit: 2017-02-17 | Discharge: 2017-02-17 | Disposition: A | Discharge status: Home / Self Care | Payer: BLUE CROSS/BLUE SHIELD | Attending: Internal Medicine | Admitting: Internal Medicine

## 2017-02-17 DIAGNOSIS — Z7982 Long term (current) use of aspirin: Secondary | ICD-10-CM | POA: Diagnosis not present

## 2017-02-17 DIAGNOSIS — Z87891 Personal history of nicotine dependence: Secondary | ICD-10-CM | POA: Insufficient documentation

## 2017-02-17 DIAGNOSIS — Z8673 Personal history of transient ischemic attack (TIA), and cerebral infarction without residual deficits: Secondary | ICD-10-CM | POA: Diagnosis not present

## 2017-02-17 DIAGNOSIS — E119 Type 2 diabetes mellitus without complications: Secondary | ICD-10-CM | POA: Insufficient documentation

## 2017-02-17 DIAGNOSIS — R21 Rash and other nonspecific skin eruption: Secondary | ICD-10-CM | POA: Insufficient documentation

## 2017-02-17 DIAGNOSIS — I10 Essential (primary) hypertension: Secondary | ICD-10-CM | POA: Diagnosis not present

## 2017-02-17 DIAGNOSIS — T7840XA Allergy, unspecified, initial encounter: Secondary | ICD-10-CM

## 2017-02-17 DIAGNOSIS — Z794 Long term (current) use of insulin: Secondary | ICD-10-CM | POA: Diagnosis not present

## 2017-02-17 DIAGNOSIS — Z79899 Other long term (current) drug therapy: Secondary | ICD-10-CM | POA: Insufficient documentation

## 2017-02-17 DIAGNOSIS — Z951 Presence of aortocoronary bypass graft: Secondary | ICD-10-CM | POA: Diagnosis not present

## 2017-02-17 DIAGNOSIS — R0789 Other chest pain: Secondary | ICD-10-CM | POA: Diagnosis not present

## 2017-02-17 DIAGNOSIS — I2510 Atherosclerotic heart disease of native coronary artery without angina pectoris: Secondary | ICD-10-CM | POA: Diagnosis not present

## 2017-02-17 LAB — CBC
HEMATOCRIT: 41.5 % (ref 40.0–52.0)
HEMOGLOBIN: 14.6 g/dL (ref 13.0–18.0)
MCH: 30.1 pg (ref 26.0–34.0)
MCHC: 35.1 g/dL (ref 32.0–36.0)
MCV: 85.8 fL (ref 80.0–100.0)
PLATELETS: 234 10*3/uL (ref 150–440)
RBC: 4.84 MIL/uL (ref 4.40–5.90)
RDW: 12.3 % (ref 11.5–14.5)
WBC: 7 10*3/uL (ref 3.8–10.6)

## 2017-02-17 LAB — BASIC METABOLIC PANEL
ANION GAP: 8 (ref 5–15)
BUN: 17 mg/dL (ref 6–20)
CHLORIDE: 99 mmol/L — AB (ref 101–111)
CO2: 26 mmol/L (ref 22–32)
CREATININE: 0.86 mg/dL (ref 0.61–1.24)
Calcium: 8.9 mg/dL (ref 8.9–10.3)
GFR calc non Af Amer: >60 mL/min (ref >60)
Glucose, Bld: 310 mg/dL — ABNORMAL HIGH (ref 65–99)
POTASSIUM: 3.7 mmol/L (ref 3.5–5.1)
SODIUM: 133 mmol/L — AB (ref 135–145)

## 2017-02-17 LAB — GLUCOSE, CAPILLARY: Glucose-Capillary: 293 mg/dL — ABNORMAL HIGH (ref 65–99)

## 2017-02-17 LAB — TROPONIN I: Troponin I: <0.03 ng/mL (ref <0.03)

## 2017-02-17 MED ORDER — SODIUM CHLORIDE 0.9 % IV BOLUS (SEPSIS)
1000.0000 mL | Freq: Once | INTRAVENOUS | Status: AC
  Administered 2017-02-17: 1000 mL via INTRAVENOUS

## 2017-02-17 MED ORDER — EPINEPHRINE 0.3 MG/0.3ML IJ SOAJ: 0.3000 mg | Freq: Once | INTRAMUSCULAR | 0 refills | Status: AC

## 2017-02-17 MED ORDER — DEXAMETHASONE SODIUM PHOSPHATE 10 MG/ML IJ SOLN
10.0000 mg | Freq: Once | INTRAMUSCULAR | Status: AC
  Administered 2017-02-17: 10 mg via INTRAVENOUS
  Filled 2017-02-17: qty 1

## 2017-02-17 MED ORDER — DIPHENHYDRAMINE HCL 50 MG/ML IJ SOLN
25.0000 mg | Freq: Once | INTRAMUSCULAR | Status: AC
  Administered 2017-02-17: 25 mg via INTRAVENOUS
  Filled 2017-02-17: qty 1

## 2017-02-17 MED ORDER — FAMOTIDINE IN NACL 20-0.9 MG/50ML-% IV SOLN
20.0000 mg | Freq: Once | INTRAVENOUS | Status: AC
  Administered 2017-02-17: 20 mg via INTRAVENOUS
  Filled 2017-02-17: qty 50

## 2017-02-17 NOTE — Discharge Instructions (Signed)
Please continue with Benadryl as needed for any itching or rash. If any facial swelling, shortness of breath or difficulty breathing, use EpiPen and seek medical care quickly. Please return to the ER for any worsening symptoms or urgent changes in her health.

## 2017-02-17 NOTE — ED Triage Notes (Addendum)
Pt states he has been itching all over all day, and feels like throat is swelling. Unsure of cause, pt with no distress noted and no swelling noted to throat or mouth. Pt took 1 benadryl 30 min ago.

## 2017-02-17 NOTE — ED Provider Notes (Signed)
[facility]-EMERGENCY DEPARTMENT Provider Note   CSN: [id number] Arrival date & time: 02/17/17  4696     History   Chief Complaint Chief Complaint  Patient presents with  . Allergic Reaction    HPI [name] is a 36 y.o. male presents to the emergency room for evaluation of rash, chest tightness, facial tingling. Symptoms began 1 hour ago. Patient states he was sitting denies any new medications, foods, soaps or detergents. Patient's first symptom was sensation of fire ants all over his body on his skin. This turned into chest tightness and tingling along his lips and tongue. He developed a rash on his back. He came to the emergency Department immediately. Patient's wife did give him 25 mg of Benadryl prior to leaving the house. He denies any history of anaphylaxis. Vital signs were within normal limits upon arrival. Patient has remained alert and speaking full sentences  HPI  Past Medical History:  Diagnosis Date  . Chest pain, unspecified   . Coronary atherosclerosis of artery bypass graft    Age 80 - [place]  . HLD (hyperlipidemia)    poorly controlled  . HTN (hypertension)   . Hx-TIA (transient ischemic attack) 2009   possible-([facility]) Normal MRI of brain and MRA of the head and neck  . Kidney stone   . MI, old   . Noncompliance   . Obesity, unspecified   . Uncontrolled diabetes mellitus with complications (HCC) 2002   poorly controlled    Patient Active Problem List   Diagnosis Date Noted  . Chest pain 09/28/2015  . Non compliance with medical treatment 03/24/2015  . Left flank pain 03/24/2015  . Cervical radiculitis 09/04/2014  . Acute prostatitis 07/20/2014  . Diabetes mellitus type 2 with complications, uncontrolled (HCC) 02/13/2014  . Macular edema 02/13/2014  . HLD (hyperlipidemia) 11/08/2013  . Left shoulder pain 11/08/2013  . Financial difficulties 11/08/2013  . Depression with anxiety 11/08/2013  . Adjustment disorder with anxiety 10/26/2012  .  Headache(784.0) 05/04/2011  . OBESITY 12/22/2010  . Atypical chest pain 03/05/2009  . Hyperlipidemia 03/03/2009  . Essential hypertension 03/03/2009  . CAD, ARTERY BYPASS GRAFT 03/03/2009    Past Surgical History:  Procedure Laterality Date  . CORONARY ARTERY BYPASS GRAFT  2007   x 4 (age 69)       Home Medications    Prior to Admission medications   Medication Sig Start Date End Date Taking? Authorizing Provider  aspirin EC 81 MG tablet Take 81 mg by mouth daily.    [provider]  atorvastatin (LIPITOR) 80 MG tablet Take 1 tablet (80 mg total) by mouth daily. Patient not taking: Reported on 10/19/2016 05/07/15   Antonieta Iba, MD  EPINEPHrine 0.3 mg/0.3 mL IJ SOAJ injection Inject 0.3 mLs (0.3 mg total) into the muscle once. 02/17/17 02/17/17  Evon Slack, PA-C  escitalopram (LEXAPRO) 10 MG tablet Take 2 tablets (20 mg total) by mouth daily. Patient not taking: Reported on 10/19/2016 09/29/15   Gale Journey, MD  famotidine (PEPCID) 40 MG tablet Take 1 tablet (40 mg total) by mouth 2 (two) times daily. Patient not taking: Reported on 10/19/2016 04/27/16 04/27/17  Rockne Menghini, MD  gabapentin (NEURONTIN) 300 MG capsule Take 1 capsule (300 mg total) by mouth at bedtime. Take for 7 days then up to 1 capsule twice daily afterwards. 08/12/15   Carollee Leitz, NP  insulin aspart (NOVOLOG) 100 UNIT/ML injection Use on a sliding scale between 5-15 units with meals.  Max daily 45 units Patient not taking: Reported on 10/19/2016 10/03/15   Carollee Leitzoss, Carrie M, NP  Insulin Glargine (LANTUS SOLOSTAR) 100 UNIT/ML Solostar Pen Inject 15 Units into the skin daily at 10 pm. Patient not taking: Reported on 10/19/2016 12/25/14   Quentin CornwallPhadke, Radhika P, MD  metFORMIN (GLUCOPHAGE) 500 MG tablet Take by mouth 2 (two) times daily with a meal.    [provider]  methocarbamol (ROBAXIN) 500 MG tablet Take 1 tablet (500 mg total) by mouth every 8 (eight) hours as needed for muscle spasms.  10/19/16   Sharman CheekStafford, Phillip, MD  naproxen (NAPROSYN) 500 MG tablet Take 1 tablet (500 mg total) by mouth 2 (two) times daily with a meal. 10/19/16   Sharman CheekStafford, Phillip, MD    Family History Family History  Problem Relation Age of Onset  . Emphysema Father        + smoker  . COPD Father        Died age 37  . Coronary artery disease Maternal Grandfather   . Heart attack Maternal Grandfather   . Diabetes Maternal Grandfather   . Hypertension Maternal Grandfather   . Hyperlipidemia Maternal Grandfather   . Heart disease Maternal Grandfather        CAD  . Lung cancer Unknown        paternal uncles and aunts (all smokers)  . Coronary artery disease Unknown        premature-family history    Social History Social History  Substance Use Topics  . Smoking status: Former Smoker    Quit date: 10/11/2004  . Smokeless tobacco: Former NeurosurgeonUser     Comment: used to smoke 1 1/2 ppd  . Alcohol use No     Comment: 1-2 drinks per month     Allergies   Nitroglycerin   Review of Systems Review of Systems  Constitutional: Negative.  Negative for activity change, appetite change, chills and fever.  HENT: Negative for congestion, ear pain, mouth sores, rhinorrhea, sinus pressure, sore throat and trouble swallowing.   Eyes: Negative for photophobia, pain and discharge.  Respiratory: Positive for chest tightness. Negative for cough and shortness of breath.   Cardiovascular: Negative for chest pain and leg swelling.  Gastrointestinal: Negative for abdominal distention, abdominal pain, diarrhea, nausea and vomiting.  Genitourinary: Negative for difficulty urinating and dysuria.  Musculoskeletal: Negative for arthralgias, back pain, gait problem and myalgias.  Skin: Positive for rash. Negative for color change.  Neurological: Positive for numbness (lips). Negative for dizziness, syncope, weakness and headaches.  Hematological: Negative for adenopathy.  Psychiatric/Behavioral: Negative for agitation and  behavioral problems.     Physical Exam Updated Vital Signs BP 136/85 (BP Location: Left Arm)   Pulse 93   Temp 98 F (36.7 C) (Oral)   Resp 18   Ht 5\' 11"  (1.803 m)   Wt 103.9 kg   SpO2 97%   BMI 31.94 kg/m   Physical Exam  Constitutional: He is oriented to person, place, and time. He appears well-developed and well-nourished. No distress.  Patient is alert, oriented, speaks full sentences no signs of difficulty breathing  HENT:  Head: Normocephalic and atraumatic.  Right Ear: External ear normal.  Left Ear: External ear normal.  Nose: Nose normal.  Mouth/Throat: Oropharynx is clear and moist. No oropharyngeal exudate.  No facial swelling, rash or signs of angioedema.  Eyes: Conjunctivae and EOM are normal. Pupils are equal, round, and reactive to light. Right eye exhibits no discharge. Left eye exhibits no discharge.  Neck: Normal range of motion.  Cardiovascular: Normal rate, regular rhythm, normal heart sounds and intact distal pulses.   Pulmonary/Chest: Effort normal and breath sounds normal. No respiratory distress. He has no wheezes. He has no rales. He exhibits no tenderness.  Initially patient with mild decrease in air movement, after dexamethasone, Benadryl, Pepcid patient with increase in air movement bilaterally  Abdominal: Soft. He exhibits no distension. There is no tenderness.  Neurological: He is alert and oriented to person, place, and time. No sensory deficit. He exhibits normal muscle tone. Coordination normal.  Skin: Skin is warm and dry.  Patient with erythematous macular rash along the upper shoulders bilaterally extending down into the lower thoracic spine. Rash resolved after dexamethasone, Benadryl, Pepcid  Psychiatric: He has a normal mood and affect. His behavior is normal. Judgment and thought content normal.     ED Treatments / Results  Labs (all labs ordered are listed, but only abnormal results are displayed) Labs Reviewed  BASIC METABOLIC  PANEL - Abnormal; Notable for the following:       Result Value   Sodium 133 (*)    Chloride 99 (*)    Glucose, Bld 310 (*)    All other components within normal limits  GLUCOSE, CAPILLARY - Abnormal; Notable for the following:    Glucose-Capillary 293 (*)    All other components within normal limits  CBC  TROPONIN I    EKG  EKG Interpretation None       Radiology Dg Chest Port 1 View  Result Date: 02/17/2017 CLINICAL DATA:  Shortness of breath. EXAM: PORTABLE CHEST 1 VIEW COMPARISON:  October 19, 2016 FINDINGS: The heart size and mediastinal contours are within normal limits. Both lungs are clear. The visualized skeletal structures are unremarkable. IMPRESSION: No active disease. Electronically Signed   By: Gerome Sam III M.D   On: 02/17/2017 20:05    Procedures Procedures (including critical care time)  Medications Ordered in ED Medications  dexamethasone (DECADRON) injection 10 mg (10 mg Intravenous Given 02/17/17 2012)  diphenhydrAMINE (BENADRYL) injection 25 mg (25 mg Intravenous Given 02/17/17 2011)  famotidine (PEPCID) IVPB 20 mg premix (0 mg Intravenous Stopped 02/17/17 2049)  sodium chloride 0.9 % bolus 1,000 mL (1,000 mLs Intravenous New Bag/Given 02/17/17 2118)     Initial Impression / Assessment and Plan / ED Course  I have reviewed the triage vital signs and the nursing notes.  Pertinent labs & imaging results that were available during my care of the patient were reviewed by me and considered in my medical decision making (see chart for details).     36 year old male with allergic reaction, chest tightness, tingling around his lips, pruritic rash along his shoulders. Patient given dexamethasone, Benadryl, famotidine, all symptoms resolved. No known allergen. No new medications he is not on ACE inhibitor. He is educated on signs and symptoms to return to the ED for. We'll use Benadryl if any recurring rash or itching. He is given a prescription for an EpiPen  to use as needed if any facial swelling, difficulty breathing or difficulty swallowing.  Final Clinical Impressions(s) / ED Diagnoses   Final diagnoses:  Allergic reaction, initial encounter  Rash    New Prescriptions New Prescriptions   EPINEPHRINE 0.3 MG/0.3 ML IJ SOAJ INJECTION    Inject 0.3 mLs (0.3 mg total) into the muscle once.     Evon Slack, PA-C 02/17/17 2304    Emily Filbert, MD 02/18/17 1501

## 2017-02-20 NOTE — Progress Notes (Deleted)
Cardiology Office Note  Date:  02/20/2017   ID:  [name], DOB Jul 27, 1981, MRN [id number]  PCP:  Jaclyn Shaggyate, Denny C, MD   No chief complaint on file.   HPI:  [age] yo with h/o CAD s/p CABG and PCI,  smoking history,  poorly controlled diabetes, Hemoglobin A1c typically more than 13 medication noncompliance,  September 2012 for chest pain, stress test in 2011 showing no ischemia, catheterization in 2010 showing patent grafts  Medication noncompliance continues to be a major issue stress test September 2014 showing no ischemia who presents for routine followup of his coronary artery disease.   endocrinology ,  Sugars continue to run high He reports having occasional fluttering in his chest lasting one hour typically once or twice per week in the evenings. Denies any chest pain with exertion concerning for angina. Is having neck pain at times, muscle spasm in his posterior shoulder areas Currently not taking metoprolol. Also not on a statin. Never had a problem on the statins per the patient  EKG on today's visit shows normal sinus rhythm with rate 80 bpm, no significant ST or T-wave changes  Other past medical history Previous Hemoglobin A1c typically more than 13   seen in the emergency room 11/21/2013 for chronic chest pain.  Cardiac enzymes negative x2. No further workup at that time Seen in clinic in followup and felt it was atypical in nature  Chest  pain in 10/11 and [facility] where he had a stress test showing basal to mid inferolateral scar and a small area of scar in the mid anteroseptum.  There was no ischemia.     PMH:   has a past medical history of Chest pain, unspecified; Coronary atherosclerosis of artery bypass graft; HLD (hyperlipidemia); HTN (hypertension); TIA (transient ischemic attack) (2009); Kidney stone; MI, old; Noncompliance; Obesity, unspecified; and Uncontrolled diabetes mellitus with complications (HCC) (2002).  PSH:    Past Surgical History:   Procedure Laterality Date  . CORONARY ARTERY BYPASS GRAFT  2007   x 4 (age 36)    Current Outpatient Prescriptions  Medication Sig Dispense Refill  . aspirin EC 81 MG tablet Take 81 mg by mouth daily.    Marland Kitchen. atorvastatin (LIPITOR) 80 MG tablet Take 1 tablet (80 mg total) by mouth daily. (Patient not taking: Reported on 10/19/2016) 90 tablet 3  . escitalopram (LEXAPRO) 10 MG tablet Take 2 tablets (20 mg total) by mouth daily. (Patient not taking: Reported on 10/19/2016) 60 tablet 0  . famotidine (PEPCID) 40 MG tablet Take 1 tablet (40 mg total) by mouth 2 (two) times daily. (Patient not taking: Reported on 10/19/2016) 10 tablet 0  . gabapentin (NEURONTIN) 300 MG capsule Take 1 capsule (300 mg total) by mouth at bedtime. Take for 7 days then up to 1 capsule twice daily afterwards. 60 capsule 3  . insulin aspart (NOVOLOG) 100 UNIT/ML injection Use on a sliding scale between 5-15 units with meals. Max daily 45 units (Patient not taking: Reported on 10/19/2016) 3 vial PRN  . Insulin Glargine (LANTUS SOLOSTAR) 100 UNIT/ML Solostar Pen Inject 15 Units into the skin daily at 10 pm. (Patient not taking: Reported on 10/19/2016) 5 pen 3  . metFORMIN (GLUCOPHAGE) 500 MG tablet Take by mouth 2 (two) times daily with a meal.    . methocarbamol (ROBAXIN) 500 MG tablet Take 1 tablet (500 mg total) by mouth every 8 (eight) hours as needed for muscle spasms. 30 tablet 0  . naproxen (NAPROSYN) 500 MG  tablet Take 1 tablet (500 mg total) by mouth 2 (two) times daily with a meal. 20 tablet 0   No current facility-administered medications for this visit.      Allergies:   Nitroglycerin   Social History:  The patient  reports that he quit smoking about 12 years ago. He has quit using smokeless tobacco. He reports that he does not drink alcohol or use drugs.   Family History:   family history includes COPD in his father; Coronary artery disease in his maternal grandfather; Diabetes in his maternal grandfather; Emphysema in  his father; Heart attack in his maternal grandfather; Heart disease in his maternal grandfather; Hyperlipidemia in his maternal grandfather; Hypertension in his maternal grandfather.    Review of Systems: ROS   PHYSICAL EXAM: VS:  There were no vitals taken for this visit. , BMI There is no height or weight on file to calculate BMI. GEN: Well nourished, well developed, in no acute distress HEENT: normal Neck: no JVD, carotid bruits, or masses Cardiac: RRR; no murmurs, rubs, or gallops,no edema  Respiratory:  clear to auscultation bilaterally, normal work of breathing GI: soft, nontender, nondistended, + BS MS: no deformity or atrophy Skin: warm and dry, no rash Neuro:  Strength and sensation are intact Psych: euthymic mood, full affect    Recent Labs: 02/17/2017: BUN 17; Creatinine, Ser 0.86; Hemoglobin 14.6; Platelets 234; Potassium 3.7; Sodium 133    Lipid Panel Lab Results  Component Value Date   CHOL 258 (H) 11/12/2013   HDL 40.10 11/12/2013   LDLCALC 194 (H) 06/11/2013   TRIG 250.0 (H) 11/12/2013      Wt Readings from Last 3 Encounters:  02/17/17 229 lb (103.9 kg)  10/19/16 230 lb (104.3 kg)  06/28/16 215 lb (97.5 kg)       ASSESSMENT AND PLAN:  No diagnosis found.   Disposition:   F/U  6 months  No orders of the defined types were placed in this encounter.    Signed, Dossie Arbour, M.D., Ph.D. 02/20/2017  [facility] [place], [place] [phone]

## 2017-02-22 ENCOUNTER — Ambulatory Visit: Payer: BLUE CROSS/BLUE SHIELD | Admitting: Cardiovascular Disease

## 2017-02-26 NOTE — Progress Notes (Deleted)
Cardiology Office Note  Date:  02/26/2017   ID:  [name], [name], MRN [id number]  PCP:  Jaclyn Shaggy, MD   No chief complaint on file.   HPI:  36 yo with h/o  premature CAD s/p CABG and PCI,  smoking history,  poorly controlled diabetes,Hemoglobin A1c typically more than 11 medication noncompliance,  catheterization in 2010 showing patent grafts  evaluation in September 2012 for chest pain,  stress test in 2011 showing no ischemia,stress test September 2014 no ischemia who presents for routine followup of his coronary artery disease.  Total chol 237, LDL 168   not taking any of his NovoLog. It is too expensive Scheduled to see new endocrinology August 15. Sugars continue to run high He reports having occasional fluttering in his chest lasting one hour typically once or twice per week in the evenings. Denies any chest pain with exertion concerning for angina. Is having neck pain at times, muscle spasm in his posterior shoulder areas Currently not taking metoprolol. Also not on a statin. Never had a problem on the statins per the patient  EKG on today's visit shows normal sinus rhythm with rate 80 bpm, no significant ST or T-wave changes  Other past medical history Previous Hemoglobin A1c typically more than 13   seen in the emergency room 11/21/2013 for chronic chest pain.  Cardiac enzymes negative x2. No further workup at that time Seen in clinic in followup and felt it was atypical in nature  Chest  pain in 10/11 and went to [facility] where he had a stress test showing basal to mid inferolateral scar and a small area of scar in the mid anteroseptum.  There was no ischemia.     PMH:   has a past medical history of Chest pain, unspecified; Coronary atherosclerosis of artery bypass graft; HLD (hyperlipidemia); HTN (hypertension); TIA (transient ischemic attack) (2009); Kidney stone; MI, old; Noncompliance; Obesity, unspecified; and Uncontrolled diabetes mellitus  with complications (HCC) (2002).  PSH:    Past Surgical History:  Procedure Laterality Date  . CORONARY ARTERY BYPASS GRAFT  2007   x 4 (age 21)    Current Outpatient Prescriptions  Medication Sig Dispense Refill  . aspirin EC 81 MG tablet Take 81 mg by mouth daily.    Marland Kitchen atorvastatin (LIPITOR) 80 MG tablet Take 1 tablet (80 mg total) by mouth daily. (Patient not taking: Reported on 10/19/2016) 90 tablet 3  . escitalopram (LEXAPRO) 10 MG tablet Take 2 tablets (20 mg total) by mouth daily. (Patient not taking: Reported on 10/19/2016) 60 tablet 0  . famotidine (PEPCID) 40 MG tablet Take 1 tablet (40 mg total) by mouth 2 (two) times daily. (Patient not taking: Reported on 10/19/2016) 10 tablet 0  . gabapentin (NEURONTIN) 300 MG capsule Take 1 capsule (300 mg total) by mouth at bedtime. Take for 7 days then up to 1 capsule twice daily afterwards. 60 capsule 3  . insulin aspart (NOVOLOG) 100 UNIT/ML injection Use on a sliding scale between 5-15 units with meals. Max daily 45 units (Patient not taking: Reported on 10/19/2016) 3 vial PRN  . Insulin Glargine (LANTUS SOLOSTAR) 100 UNIT/ML Solostar Pen Inject 15 Units into the skin daily at 10 pm. (Patient not taking: Reported on 10/19/2016) 5 pen 3  . metFORMIN (GLUCOPHAGE) 500 MG tablet Take by mouth 2 (two) times daily with a meal.    . methocarbamol (ROBAXIN) 500 MG tablet Take 1 tablet (500 mg total) by mouth every 8 (eight) hours  as needed for muscle spasms. 30 tablet 0  . naproxen (NAPROSYN) 500 MG tablet Take 1 tablet (500 mg total) by mouth 2 (two) times daily with a meal. 20 tablet 0   No current facility-administered medications for this visit.      Allergies:   Nitroglycerin   Social History:  The patient  reports that he quit smoking about 12 years ago. He has quit using smokeless tobacco. He reports that he does not drink alcohol or use drugs.   Family History:   family history includes COPD in his father; Coronary artery disease in his  maternal grandfather; Diabetes in his maternal grandfather; Emphysema in his father; Heart attack in his maternal grandfather; Heart disease in his maternal grandfather; Hyperlipidemia in his maternal grandfather; Hypertension in his maternal grandfather.    Review of Systems: ROS   PHYSICAL EXAM: VS:  There were no vitals taken for this visit. , BMI There is no height or weight on file to calculate BMI. GEN: Well nourished, well developed, in no acute distress HEENT: normal Neck: no JVD, carotid bruits, or masses Cardiac: RRR; no murmurs, rubs, or gallops,no edema  Respiratory:  clear to auscultation bilaterally, normal work of breathing GI: soft, nontender, nondistended, + BS MS: no deformity or atrophy Skin: warm and dry, no rash Neuro:  Strength and sensation are intact Psych: euthymic mood, full affect    Recent Labs: 02/17/2017: BUN 17; Creatinine, Ser 0.86; Hemoglobin 14.6; Platelets 234; Potassium 3.7; Sodium 133    Lipid Panel Lab Results  Component Value Date   CHOL 258 (H) 11/12/2013   HDL 40.10 11/12/2013   LDLCALC 194 (H) 06/11/2013   TRIG 250.0 (H) 11/12/2013      Wt Readings from Last 3 Encounters:  02/17/17 229 lb (103.9 kg)  10/19/16 230 lb (104.3 kg)  06/28/16 215 lb (97.5 kg)       ASSESSMENT AND PLAN:  No diagnosis found.   Disposition:   F/U  6 months  No orders of the defined types were placed in this encounter.    Signed, Dossie Arbourim Dimitris Shanahan, M.D., Ph.D. 02/26/2017  [facility], [place] [phone]

## 2017-03-01 ENCOUNTER — Ambulatory Visit: Payer: BLUE CROSS/BLUE SHIELD | Admitting: Cardiovascular Disease

## 2017-03-18 ENCOUNTER — Encounter: Payer: Self-pay | Admitting: *Deleted

## 2017-03-18 ENCOUNTER — Encounter: Payer: BLUE CROSS/BLUE SHIELD | Attending: Family Medicine | Admitting: *Deleted

## 2017-03-18 VITALS — BP 118/70 | Ht 71.0 in | Wt 231.0 lb

## 2017-03-18 DIAGNOSIS — Z713 Dietary counseling and surveillance: Secondary | ICD-10-CM | POA: Diagnosis not present

## 2017-03-18 DIAGNOSIS — E119 Type 2 diabetes mellitus without complications: Secondary | ICD-10-CM | POA: Diagnosis not present

## 2017-03-18 DIAGNOSIS — E1165 Type 2 diabetes mellitus with hyperglycemia: Secondary | ICD-10-CM

## 2017-03-18 NOTE — Patient Instructions (Signed)
Check blood sugars 2 x day before breakfast and 2 hrs after supper every day then 4 x day before each meal and before bed when taking insulin  Bring blood sugar records to the next class  Exercise:  Walk as tolerated  Eat 3 meals day,   1-2  snacks a day Space meals 4-6 hours apart Don't skip meals Limit fried foods Avoid sugar sweetened drinks (juices) unless treating a low blood sugar  Carry fast acting glucose and a snack at all times Rotate injection sites  Return for classes on:

## 2017-03-18 NOTE — Progress Notes (Signed)
Diabetes Self-Management Education  Visit Type: First/Initial  Appt. Start Time: 1115 Appt. End Time: 1230  03/18/2017  [name], identified by name and date of birth, is a 36 y.o. male with a diagnosis of Diabetes: Type 2.   ASSESSMENT  Blood pressure 118/70, height _0  (1.803 m), weight 231 lb (104.8 kg). Body mass index is 32.22 kg/m.      Diabetes Self-Management Education - 03/18/17 1256      Visit Information   Visit Type First/Initial     Initial Visit   Diabetes Type Type 2   Are you currently following a meal plan? No   Are you taking your medications as prescribed? No  can't afford insulin - talking with insurance; provided him information on pt assistance program with insulin company   Date Diagnosed 16 years     Health Coping   How would you rate your overall health? Fair     Psychosocial Assessment   Patient Belief/Attitude about Diabetes Defeat/Burnout  "aggravated"   Self-care barriers None   Self-management support Doctor's office;Family   Other persons present Spouse/SO   Patient Concerns Glycemic Control;Nutrition/Meal planning   Special Needs None   Preferred Learning Style Hands on   Learning Readiness Ready   How often do you need to have someone help you when you read instructions, pamphlets, or other written materials from your doctor or pharmacy? 1 - Never   What is the last grade level you completed in school? high school graduate     Pre-Education Assessment   Patient understands the diabetes disease and treatment process. Needs Instruction   Patient understands incorporating nutritional management into lifestyle. Needs Instruction   Patient undertands incorporating physical activity into lifestyle. Needs Instruction   Patient understands using medications safely. Needs Instruction   Patient understands monitoring blood glucose, interpreting and using results Needs Review   Patient understands prevention, detection, and treatment  of acute complications. Needs Instruction   Patient understands prevention, detection, and treatment of chronic complications. Needs Instruction   Patient understands how to develop strategies to address psychosocial issues. Needs Instruction   Patient understands how to develop strategies to promote health/change behavior. Needs Instruction     Complications   Last HgB A1C per patient/outside source 11 %  02/15/17   How often do you check your blood sugar? 3-4 times / week  Pt reports not checking on regular basis. Last reading on Wednesday before lunch was 247 mg/dL. BG today in the office was 289 mg/dL at 12:10 pm - 5 hrs pp.    Have you had a dilated eye exam in the past 12 months? No   Have you had a dental exam in the past 12 months? No   Are you checking your feet? Yes   How many days per week are you checking your feet? 7     Dietary Intake   Breakfast skips - eats out for most meals   Lunch chicken wrap and fries; cheese burger, [place], [place]   Dinner same as lunch and also seafood, pork with rice or potatoes or mac-n-cheese; green beans, corn, peas     Exercise   Exercise Type ADL's     Patient Education   Previous Diabetes Education No   Disease state  Definition of diabetes, type 1 and 2, and the diagnosis of diabetes   Nutrition management  Role of diet in the treatment of diabetes and the relationship between the three main macronutrients and blood glucose  level;Reviewed blood glucose goals for pre and post meals and how to evaluate the patients' food intake on their blood glucose level.;Meal timing in regards to the patients' current diabetes medication.   Physical activity and exercise  Role of exercise on diabetes management, blood pressure control and cardiac health.   Medications Taught/reviewed insulin injection, site rotation, insulin storage and needle disposal.;Reviewed patients medication for diabetes, action, purpose, timing of dose and side effects.    Monitoring Purpose and frequency of SMBG.;Taught/discussed recording of test results and interpretation of SMBG.;Identified appropriate SMBG and/or A1C goals.   Acute complications Taught treatment of hypoglycemia - the 15 rule.   Chronic complications Relationship between chronic complications and blood glucose control   Psychosocial adjustment Role of stress on diabetes;Identified and addressed patients feelings and concerns about diabetes;Other (comment)  referral made to [facility] for stress counseling appt   Personal strategies to promote health Other (comment)  Instructed pt to be seen in Urgent Care for infection of left elbow (redness and pain in arm)     Individualized Goals (developed by patient)   Reducing Risk Improve blood sugars Become more fit     Outcomes   Expected Outcomes Demonstrated interest in learning. Expect positive outcomes   Future DMSE 2 wks      Individualized Plan for Diabetes Self-Management Training:   Learning Objective:  Patient will have a greater understanding of diabetes self-management. Patient education plan is to attend individual and/or group sessions per assessed needs and concerns.   Plan:   Patient Instructions  Check blood sugars 2 x day before breakfast and 2 hrs after supper every day then 4 x day before each meal and before bed when taking insulin Bring blood sugar records to the next class Exercise:  Walk as tolerated Eat 3 meals day,   1-2  snacks a day Space meals 4-6 hours apart Don't skip meals Limit fried foods Avoid sugar sweetened drinks (juices) unless treating a low blood sugar Carry fast acting glucose and a snack at all times Rotate injection sites   Expected Outcomes:  Demonstrated interest in learning. Expect positive outcomes  Education material provided:  General Meal Planning Guidelines Simple Meal Plan Glucose tablets Symptoms, causes and treatments of Hypoglycemia  If problems or questions, patient to contact  team via:  Johny Drilling, [place], [place], CDE [phone]  Future DSME appointment: 2 wks  April 04, 2017 for Diabetes Class 1

## 2017-03-21 NOTE — Progress Notes (Signed)
]      ROS ] 

## 2017-03-21 NOTE — Progress Notes (Signed)
Cardiology Office Note  Date:  03/22/2017   ID:  [name], DOB 07/26/1981, MRN [id number]  PCP:  Marisue Ivan, MD   Chief Complaint  Patient presents with  . OTHER    LS 2016 c/o dizziness. Pt unable to afford insulin holding at the moment. Meds reviewed verbally with pt.    HPI:  36 yo with h/o  premature CAD s/p CABG and PCI,  smoking history,  continued poorly controlled diabetes,  stress test in 2011 showing no ischemia,  last catheterization in 2010 showing patent grafts  September 2012 for chest pain,  Previous history of medication noncompliance stress test September 2014 showing no ischemia who presents for routine followup of his coronary artery disease.  In the ER 10/2016 with Chest pain Hospital records reviewed with the patient in detail  Reports he is having difficulty affording his insulin Previously seen by endocrinology Denies any chest pain with exertion concerning for angina.  Currently not taking metoprolol. Reports that his prescription has run out He is taking Crestor He is on losartan 25 mill grams daily  Having dizziness the past several weeks  orthostatics done in the office shows drop in his systolic pressure 118 down to 94 with standing heart rate 97 up to 103 with standing no significant improvement after 3 minutes, systolic pressure 100 heart rate 106  EKG on today's visit shows normal sinus rhythm with rate 95 bpm, nonspecific ST abnormality, no change compared to previous EKGs  Other past medical history Previous Hemoglobin A1c typically more than 13   seen in the emergency room 11/21/2013 for chronic chest pain.  Cardiac enzymes negative x2. No further workup at that time Seen in clinic in followup and felt it was atypical in nature  Chest  pain in 10/11 and went to [facility] where he had a stress test showing basal to mid inferolateral scar and a small area of scar in the mid anteroseptum.  There was no ischemia.  PMH:   has a  past medical history of Chest pain, unspecified; Coronary atherosclerosis of artery bypass graft; HLD (hyperlipidemia); HTN (hypertension); TIA (transient ischemic attack) (2009); Kidney stone; MI, old; Noncompliance; Obesity, unspecified; and Uncontrolled diabetes mellitus with complications (HCC) (2002).  PSH:    Past Surgical History:  Procedure Laterality Date  . CORONARY ARTERY BYPASS GRAFT  2007   x 4 (age 15)    Current Outpatient Prescriptions  Medication Sig Dispense Refill  . aspirin EC 81 MG tablet Take 81 mg by mouth daily.    Marland Kitchen doxycycline (DORYX) 100 MG EC tablet Take 100 mg by mouth 2 (two) times daily.    Marland Kitchen gabapentin (NEURONTIN) 300 MG capsule Take 300 mg by mouth at bedtime.    Marland Kitchen losartan (COZAAR) 25 MG tablet Take 25 mg by mouth at bedtime.    . metFORMIN (GLUCOPHAGE) 1000 MG tablet Take 2,000 mg by mouth 2 (two) times daily.     . metoprolol tartrate (LOPRESSOR) 25 MG tablet Take 25 mg by mouth 2 (two) times daily.    . Multiple Vitamin (MULTIVITAMIN) tablet Take 2 tablets by mouth daily.     . rosuvastatin (CRESTOR) 20 MG tablet Take 20 mg by mouth daily.    . insulin aspart (NOVOLOG) 100 UNIT/ML FlexPen Inject 5 Units into the skin 3 (three) times daily before meals.    . Insulin Detemir (LEVEMIR [place]) Inject 15 Units into the skin at bedtime.     No current facility-administered medications for this visit.  Allergies:   Nitroglycerin   Social History:  The patient  reports that he quit smoking about 12 years ago. His smoking use included Cigarettes. He has a 28.00 pack-year smoking history. He has quit using smokeless tobacco. His smokeless tobacco use included Chew and Snuff. He reports that he drinks about 1.2 oz of alcohol per week . He reports that he does not use drugs.   Family History:   family history includes COPD in his father; Coronary artery disease in his maternal grandfather; Diabetes in his maternal grandfather; Emphysema in his father; Heart  attack in his maternal grandfather; Heart disease in his maternal grandfather; Hyperlipidemia in his maternal grandfather; Hypertension in his maternal grandfather.    Review of Systems: Review of Systems  Constitutional: Negative.   Respiratory: Negative.   Cardiovascular: Negative.   Gastrointestinal: Negative.   Musculoskeletal: Negative.   Neurological: Positive for dizziness.  Psychiatric/Behavioral: Negative.   All other systems reviewed and are negative.    PHYSICAL EXAM: VS:  BP 110/68 (BP Location: Left Arm, Patient Position: Sitting, Cuff Size: Normal)   Pulse 95   Ht 5\' 11"  (1.803 m)   Wt 233 lb 12 oz (106 kg)   BMI 32.60 kg/m  , BMI Body mass index is 32.6 kg/m. GEN: Well nourished, well developed, in no acute distress  HEENT: normal  Neck: no JVD, carotid bruits, or masses Cardiac: RRR; no murmurs, rubs, or gallops,no edema  Respiratory:  clear to auscultation bilaterally, normal work of breathing GI: soft, nontender, nondistended, + BS MS: no deformity or atrophy  Skin: warm and dry, no rash Neuro:  Strength and sensation are intact Psych: euthymic mood, full affect    Recent Labs: 02/17/2017: BUN 17; Creatinine, Ser 0.86; Hemoglobin 14.6; Platelets 234; Potassium 3.7; Sodium 133    Lipid Panel Lab Results  Component Value Date   CHOL 258 (H) 11/12/2013   HDL 40.10 11/12/2013   LDLCALC 194 (H) 06/11/2013   TRIG 250.0 (H) 11/12/2013      Wt Readings from Last 3 Encounters:  03/22/17 233 lb 12 oz (106 kg)  03/18/17 231 lb (104.8 kg)  02/17/17 229 lb (103.9 kg)       ASSESSMENT AND PLAN:  Atherosclerosis of coronary artery bypass graft of native heart with stable angina pectoris (HCC) Currently with no symptoms of angina. No further workup at this time. Continue current medication regimen.  stressed importance of aggressive diabetes control and smoking cessation  Essential hypertension Orthostatic on today's visit Recommended he hold  losartan. We will continue metoprolol. Encouraged fluid intake, salt intake temporarily  Mixed hyperlipidemia Recommended he stay on his Crestor 20 g daily  Uncontrolled type 2 diabetes mellitus with complication, with long-term current use of insulin (HCC)  unable to afford his insulin Working with wellness clinic, primary care Hemoglobin A1c 11, down from 12  Chest pain, unspecified type History of chest pain early in year No further workup at this time   Total encounter time more than 25 minutes  Greater than 50% was spent in counseling and coordination of care with the patient   Disposition:   F/U  12 months  No orders of the defined types were placed in this encounter.    Signed, Dossie Arbourim Gollan, M.D., Ph.D. 03/22/2017  [facility] [place] [phone]

## 2017-03-22 ENCOUNTER — Ambulatory Visit (INDEPENDENT_AMBULATORY_CARE_PROVIDER_SITE_OTHER): Payer: Medicare Other | Admitting: Cardiovascular Disease

## 2017-03-22 ENCOUNTER — Encounter: Payer: Self-pay | Admitting: Cardiovascular Disease

## 2017-03-22 VITALS — BP 110/68 | HR 95 | Ht 71.0 in | Wt 233.8 lb

## 2017-03-22 DIAGNOSIS — E1165 Type 2 diabetes mellitus with hyperglycemia: Secondary | ICD-10-CM | POA: Diagnosis not present

## 2017-03-22 DIAGNOSIS — Z9119 Patient's noncompliance with other medical treatment and regimen: Secondary | ICD-10-CM | POA: Diagnosis not present

## 2017-03-22 DIAGNOSIS — Z91199 Patient's noncompliance with other medical treatment and regimen due to unspecified reason: Secondary | ICD-10-CM

## 2017-03-22 DIAGNOSIS — Z794 Long term (current) use of insulin: Secondary | ICD-10-CM

## 2017-03-22 DIAGNOSIS — R079 Chest pain, unspecified: Secondary | ICD-10-CM | POA: Diagnosis not present

## 2017-03-22 DIAGNOSIS — E782 Mixed hyperlipidemia: Secondary | ICD-10-CM

## 2017-03-22 DIAGNOSIS — I10 Essential (primary) hypertension: Secondary | ICD-10-CM

## 2017-03-22 DIAGNOSIS — IMO0002 Reserved for concepts with insufficient information to code with codable children: Secondary | ICD-10-CM

## 2017-03-22 DIAGNOSIS — I209 Angina pectoris, unspecified: Secondary | ICD-10-CM

## 2017-03-22 DIAGNOSIS — I25708 Atherosclerosis of coronary artery bypass graft(s), unspecified, with other forms of angina pectoris: Secondary | ICD-10-CM

## 2017-03-22 DIAGNOSIS — E118 Type 2 diabetes mellitus with unspecified complications: Secondary | ICD-10-CM | POA: Diagnosis not present

## 2017-03-22 MED ORDER — ROSUVASTATIN CALCIUM 20 MG PO TABS: 20.0000 mg | ORAL_TABLET | Freq: Every day | ORAL | 11 refills | Status: DC

## 2017-03-22 MED ORDER — METOPROLOL TARTRATE 25 MG PO TABS: 25.0000 mg | ORAL_TABLET | Freq: Two times a day (BID) | ORAL | 11 refills | Status: DC

## 2017-03-22 NOTE — Patient Instructions (Addendum)
Medication Instructions:   Hold the losartan for now Restart the metoprolol twice a day Push the fluids/salt  Only restart losartan once dizziness foes away, blood pressure come back up  Labwork:  No new labs needed  Testing/Procedures:  No further testing at this time   I recommend watching educational videos on topics of interest to you at:       [email or web address]  Enter code: [email or web address]    Follow-Up: It was a pleasure seeing you in the office today. Please call us if you have new issues that need to be addressed before your next appt.  [phone]  Your physician wants you to follow-up in: 12 months.  You will receive a reminder letter in the mail two months in advance. If you don't receive a letter, please call our office to schedule the follow-up appointment.  If you need a refill on your cardiac medications before your next appointment, please call your pharmacy.

## 2017-04-04 ENCOUNTER — Ambulatory Visit: Payer: BLUE CROSS/BLUE SHIELD

## 2017-04-06 ENCOUNTER — Telehealth: Payer: Self-pay | Admitting: Dietician

## 2017-04-06 NOTE — Telephone Encounter (Signed)
Called patient to reschedule class series, as he missed class 1 on 04/04/17. He rescheduled to 05/09/17.

## 2017-04-18 ENCOUNTER — Ambulatory Visit: Payer: BLUE CROSS/BLUE SHIELD

## 2017-04-25 ENCOUNTER — Ambulatory Visit: Payer: BLUE CROSS/BLUE SHIELD

## 2017-05-09 ENCOUNTER — Ambulatory Visit: Payer: Medicare Other

## 2017-05-16 ENCOUNTER — Ambulatory Visit: Payer: Medicare Other

## 2017-05-23 ENCOUNTER — Ambulatory Visit: Payer: Medicare Other

## 2017-05-24 ENCOUNTER — Encounter: Payer: Self-pay | Admitting: *Deleted

## 2017-07-10 ENCOUNTER — Emergency Department: Payer: Medicare Other

## 2017-07-10 ENCOUNTER — Emergency Department
Admission: EM | Admit: 2017-07-10 | Discharge: 2017-07-10 | Disposition: A | Discharge status: Other Acute Inpatient Hospital | Payer: Medicare Other | Attending: Emergency Medicine | Admitting: Emergency Medicine

## 2017-07-10 ENCOUNTER — Encounter (HOSPITAL_COMMUNITY): Payer: Self-pay | Admitting: *Deleted

## 2017-07-10 ENCOUNTER — Inpatient Hospital Stay (HOSPITAL_COMMUNITY)
Admission: EM | Admit: 2017-07-10 | Discharge: 2017-07-12 | DRG: 246 | Disposition: A | Discharge status: Home / Self Care | Payer: Medicare Other | Source: Other Acute Inpatient Hospital | Attending: Interventional Cardiology | Admitting: Interventional Cardiology

## 2017-07-10 ENCOUNTER — Inpatient Hospital Stay (HOSPITAL_COMMUNITY)
Admission: EM | Disposition: A | Discharge status: Home / Self Care | Payer: Self-pay | Source: Other Acute Inpatient Hospital | Attending: Interventional Cardiology

## 2017-07-10 DIAGNOSIS — I25700 Atherosclerosis of coronary artery bypass graft(s), unspecified, with unstable angina pectoris: Secondary | ICD-10-CM | POA: Diagnosis not present

## 2017-07-10 DIAGNOSIS — Z87891 Personal history of nicotine dependence: Secondary | ICD-10-CM | POA: Diagnosis not present

## 2017-07-10 DIAGNOSIS — E1165 Type 2 diabetes mellitus with hyperglycemia: Secondary | ICD-10-CM | POA: Diagnosis present

## 2017-07-10 DIAGNOSIS — R61 Generalized hyperhidrosis: Secondary | ICD-10-CM | POA: Diagnosis not present

## 2017-07-10 DIAGNOSIS — Z794 Long term (current) use of insulin: Secondary | ICD-10-CM | POA: Diagnosis not present

## 2017-07-10 DIAGNOSIS — I10 Essential (primary) hypertension: Secondary | ICD-10-CM | POA: Diagnosis present

## 2017-07-10 DIAGNOSIS — Z7982 Long term (current) use of aspirin: Secondary | ICD-10-CM | POA: Insufficient documentation

## 2017-07-10 DIAGNOSIS — I252 Old myocardial infarction: Secondary | ICD-10-CM

## 2017-07-10 DIAGNOSIS — Z8249 Family history of ischemic heart disease and other diseases of the circulatory system: Secondary | ICD-10-CM | POA: Diagnosis not present

## 2017-07-10 DIAGNOSIS — I110 Hypertensive heart disease with heart failure: Secondary | ICD-10-CM | POA: Diagnosis present

## 2017-07-10 DIAGNOSIS — Z888 Allergy status to other drugs, medicaments and biological substances status: Secondary | ICD-10-CM

## 2017-07-10 DIAGNOSIS — E782 Mixed hyperlipidemia: Secondary | ICD-10-CM | POA: Diagnosis present

## 2017-07-10 DIAGNOSIS — I2510 Atherosclerotic heart disease of native coronary artery without angina pectoris: Secondary | ICD-10-CM

## 2017-07-10 DIAGNOSIS — Z23 Encounter for immunization: Secondary | ICD-10-CM

## 2017-07-10 DIAGNOSIS — I213 ST elevation (STEMI) myocardial infarction of unspecified site: Secondary | ICD-10-CM | POA: Diagnosis not present

## 2017-07-10 DIAGNOSIS — R0602 Shortness of breath: Secondary | ICD-10-CM | POA: Insufficient documentation

## 2017-07-10 DIAGNOSIS — I5021 Acute systolic (congestive) heart failure: Secondary | ICD-10-CM | POA: Diagnosis present

## 2017-07-10 DIAGNOSIS — Z87442 Personal history of urinary calculi: Secondary | ICD-10-CM

## 2017-07-10 DIAGNOSIS — R079 Chest pain, unspecified: Secondary | ICD-10-CM | POA: Diagnosis present

## 2017-07-10 DIAGNOSIS — R110 Nausea: Secondary | ICD-10-CM | POA: Diagnosis not present

## 2017-07-10 DIAGNOSIS — IMO0002 Reserved for concepts with insufficient information to code with codable children: Secondary | ICD-10-CM | POA: Diagnosis present

## 2017-07-10 DIAGNOSIS — Z8673 Personal history of transient ischemic attack (TIA), and cerebral infarction without residual deficits: Secondary | ICD-10-CM | POA: Diagnosis not present

## 2017-07-10 DIAGNOSIS — E1159 Type 2 diabetes mellitus with other circulatory complications: Secondary | ICD-10-CM | POA: Diagnosis not present

## 2017-07-10 DIAGNOSIS — I5042 Chronic combined systolic (congestive) and diastolic (congestive) heart failure: Secondary | ICD-10-CM

## 2017-07-10 DIAGNOSIS — Z79899 Other long term (current) drug therapy: Secondary | ICD-10-CM

## 2017-07-10 DIAGNOSIS — I2121 ST elevation (STEMI) myocardial infarction involving left circumflex coronary artery: Secondary | ICD-10-CM | POA: Diagnosis not present

## 2017-07-10 DIAGNOSIS — Z9114 Patient's other noncompliance with medication regimen: Secondary | ICD-10-CM | POA: Insufficient documentation

## 2017-07-10 DIAGNOSIS — I25810 Atherosclerosis of coronary artery bypass graft(s) without angina pectoris: Secondary | ICD-10-CM | POA: Diagnosis not present

## 2017-07-10 DIAGNOSIS — E119 Type 2 diabetes mellitus without complications: Secondary | ICD-10-CM | POA: Insufficient documentation

## 2017-07-10 DIAGNOSIS — E118 Type 2 diabetes mellitus with unspecified complications: Secondary | ICD-10-CM

## 2017-07-10 DIAGNOSIS — I2129 ST elevation (STEMI) myocardial infarction involving other sites: Secondary | ICD-10-CM | POA: Diagnosis present

## 2017-07-10 DIAGNOSIS — Z955 Presence of coronary angioplasty implant and graft: Secondary | ICD-10-CM

## 2017-07-10 HISTORY — PX: CORONARY BALLOON ANGIOPLASTY: CATH118233

## 2017-07-10 HISTORY — PX: LEFT HEART CATH AND CORS/GRAFTS ANGIOGRAPHY: CATH118250

## 2017-07-10 HISTORY — PX: CORONARY STENT INTERVENTION: CATH118234

## 2017-07-10 LAB — COMPREHENSIVE METABOLIC PANEL
ALK PHOS: 60 U/L (ref 38–126)
ALT: 22 U/L (ref 17–63)
AST: 29 U/L (ref 15–41)
Albumin: 3.3 g/dL — ABNORMAL LOW (ref 3.5–5.0)
Anion gap: 11 (ref 5–15)
BILIRUBIN TOTAL: 0.7 mg/dL (ref 0.3–1.2)
BUN: 14 mg/dL (ref 6–20)
CALCIUM: 9.1 mg/dL (ref 8.9–10.3)
CO2: 22 mmol/L (ref 22–32)
CREATININE: 0.78 mg/dL (ref 0.61–1.24)
Chloride: 100 mmol/L — ABNORMAL LOW (ref 101–111)
Glucose, Bld: 267 mg/dL — ABNORMAL HIGH (ref 65–99)
Potassium: 4.5 mmol/L (ref 3.5–5.1)
Sodium: 133 mmol/L — ABNORMAL LOW (ref 135–145)
TOTAL PROTEIN: 7.3 g/dL (ref 6.5–8.1)

## 2017-07-10 LAB — LIPID PANEL
CHOLESTEROL: 253 mg/dL — AB (ref 0–200)
HDL: 33 mg/dL — ABNORMAL LOW (ref >40)
LDL Cholesterol: 190 mg/dL — ABNORMAL HIGH (ref 0–99)
Total CHOL/HDL Ratio: 7.7 RATIO
Triglycerides: 148 mg/dL (ref <150)
VLDL: 30 mg/dL (ref 0–40)

## 2017-07-10 LAB — MRSA PCR SCREENING: MRSA by PCR: NEGATIVE

## 2017-07-10 LAB — GLUCOSE, CAPILLARY
GLUCOSE-CAPILLARY: 162 mg/dL — AB (ref 65–99)
Glucose-Capillary: 235 mg/dL — ABNORMAL HIGH (ref 65–99)
Glucose-Capillary: 286 mg/dL — ABNORMAL HIGH (ref 65–99)

## 2017-07-10 LAB — CBC WITH DIFFERENTIAL/PLATELET
BASOS ABS: 0.1 10*3/uL (ref 0–0.1)
Basophils Relative: 1 %
EOS PCT: 1 %
Eosinophils Absolute: 0.1 10*3/uL (ref 0–0.7)
HCT: 40.3 % (ref 40.0–52.0)
Hemoglobin: 14.2 g/dL (ref 13.0–18.0)
Lymphocytes Relative: 40 %
Lymphs Abs: 3.1 10*3/uL (ref 1.0–3.6)
MCH: 29.7 pg (ref 26.0–34.0)
MCHC: 35.2 g/dL (ref 32.0–36.0)
MCV: 84.5 fL (ref 80.0–100.0)
MONO ABS: 0.9 10*3/uL (ref 0.2–1.0)
MONOS PCT: 12 %
Neutro Abs: 3.5 10*3/uL (ref 1.4–6.5)
Neutrophils Relative %: 46 %
PLATELETS: 249 10*3/uL (ref 150–440)
RBC: 4.76 MIL/uL (ref 4.40–5.90)
RDW: 12 % (ref 11.5–14.5)
WBC: 7.7 10*3/uL (ref 3.8–10.6)

## 2017-07-10 LAB — PROTIME-INR
INR: 0.96
Prothrombin Time: 12.7 seconds (ref 11.4–15.2)

## 2017-07-10 LAB — TROPONIN I
TROPONIN I: 3.79 ng/mL — AB (ref <0.03)
TROPONIN I: 4.04 ng/mL — AB (ref <0.03)

## 2017-07-10 LAB — APTT: APTT: 29 s (ref 24–36)

## 2017-07-10 SURGERY — LEFT HEART CATH AND CORS/GRAFTS ANGIOGRAPHY
Anesthesia: LOCAL

## 2017-07-10 MED ORDER — IOPAMIDOL (ISOVUE-370) INJECTION 76%
INTRAVENOUS | Status: AC
  Filled 2017-07-10: qty 50

## 2017-07-10 MED ORDER — INSULIN ASPART 100 UNIT/ML ~~LOC~~ SOLN
0.0000 [IU] | Freq: Three times a day (TID) | SUBCUTANEOUS | Status: DC
  Administered 2017-07-11 – 2017-07-12 (×4): 5 [IU] via SUBCUTANEOUS

## 2017-07-10 MED ORDER — INSULIN ASPART 100 UNIT/ML ~~LOC~~ SOLN
0.0000 [IU] | Freq: Every day | SUBCUTANEOUS | Status: DC
  Administered 2017-07-10: 3 [IU] via SUBCUTANEOUS
  Administered 2017-07-11: 4 [IU] via SUBCUTANEOUS

## 2017-07-10 MED ORDER — BIVALIRUDIN TRIFLUOROACETATE 250 MG IV SOLR
INTRAVENOUS | Status: AC
  Filled 2017-07-10: qty 250

## 2017-07-10 MED ORDER — ASPIRIN 81 MG PO CHEW
162.0000 mg | CHEWABLE_TABLET | Freq: Once | ORAL | Status: AC
  Administered 2017-07-10: 162 mg via ORAL

## 2017-07-10 MED ORDER — MIDAZOLAM HCL 2 MG/2ML IJ SOLN
INTRAMUSCULAR | Status: DC | PRN
  Administered 2017-07-10: 1 mg via INTRAVENOUS

## 2017-07-10 MED ORDER — HEPARIN (PORCINE) IN NACL 2-0.9 UNIT/ML-% IJ SOLN
INTRAMUSCULAR | Status: AC
  Filled 2017-07-10: qty 1000

## 2017-07-10 MED ORDER — SODIUM CHLORIDE 0.9 % IV SOLN
INTRAVENOUS | Status: AC | PRN
  Administered 2017-07-10 (×2): 1.75 mg/kg/h via INTRAVENOUS

## 2017-07-10 MED ORDER — METOPROLOL TARTRATE 25 MG PO TABS
25.0000 mg | ORAL_TABLET | Freq: Two times a day (BID) | ORAL | Status: DC
  Administered 2017-07-10 – 2017-07-12 (×5): 25 mg via ORAL
  Filled 2017-07-10 (×5): qty 1

## 2017-07-10 MED ORDER — ONDANSETRON HCL 4 MG/2ML IJ SOLN: 4.0000 mg | Freq: Four times a day (QID) | INTRAMUSCULAR | Status: DC | PRN

## 2017-07-10 MED ORDER — MIDAZOLAM HCL 2 MG/2ML IJ SOLN
INTRAMUSCULAR | Status: AC
  Filled 2017-07-10: qty 2

## 2017-07-10 MED ORDER — PNEUMOCOCCAL VAC POLYVALENT 25 MCG/0.5ML IJ INJ
0.5000 mL | INJECTION | INTRAMUSCULAR | Status: AC
  Administered 2017-07-12: 0.5 mL via INTRAMUSCULAR
  Filled 2017-07-10: qty 0.5

## 2017-07-10 MED ORDER — FENTANYL CITRATE (PF) 100 MCG/2ML IJ SOLN
100.0000 ug | Freq: Once | INTRAMUSCULAR | Status: AC
  Administered 2017-07-10: 100 ug via INTRAVENOUS

## 2017-07-10 MED ORDER — SODIUM CHLORIDE 0.9 % IV SOLN
INTRAVENOUS | Status: AC | PRN
  Administered 2017-07-10: 30 mL/h via INTRAVENOUS

## 2017-07-10 MED ORDER — HEPARIN (PORCINE) IN NACL 100-0.45 UNIT/ML-% IJ SOLN
1100.0000 [IU]/h | INTRAMUSCULAR | Status: DC
  Administered 2017-07-10: 1100 [IU]/h via INTRAVENOUS
  Filled 2017-07-10: qty 250

## 2017-07-10 MED ORDER — BIVALIRUDIN BOLUS VIA INFUSION - CUPID
INTRAVENOUS | Status: DC | PRN
  Administered 2017-07-10: 68.025 mg via INTRAVENOUS

## 2017-07-10 MED ORDER — SODIUM CHLORIDE 0.9 % IV SOLN: INTRAVENOUS | Status: AC

## 2017-07-10 MED ORDER — SODIUM CHLORIDE 0.9% FLUSH
3.0000 mL | Freq: Two times a day (BID) | INTRAVENOUS | Status: DC
  Administered 2017-07-11 – 2017-07-12 (×3): 3 mL via INTRAVENOUS

## 2017-07-10 MED ORDER — ASPIRIN EC 81 MG PO TBEC
81.0000 mg | DELAYED_RELEASE_TABLET | Freq: Every day | ORAL | Status: DC
  Administered 2017-07-11 – 2017-07-12 (×2): 81 mg via ORAL
  Filled 2017-07-10 (×2): qty 1

## 2017-07-10 MED ORDER — LOSARTAN POTASSIUM 25 MG PO TABS
25.0000 mg | ORAL_TABLET | Freq: Every day | ORAL | Status: DC
  Administered 2017-07-10 – 2017-07-11 (×2): 25 mg via ORAL
  Filled 2017-07-10 (×2): qty 1

## 2017-07-10 MED ORDER — HEPARIN (PORCINE) IN NACL 2-0.9 UNIT/ML-% IJ SOLN
INTRAMUSCULAR | Status: AC | PRN
  Administered 2017-07-10: 1000 mL

## 2017-07-10 MED ORDER — INSULIN ASPART 100 UNIT/ML ~~LOC~~ SOLN
0.0000 [IU] | Freq: Three times a day (TID) | SUBCUTANEOUS | Status: DC
  Administered 2017-07-10: 3 [IU] via SUBCUTANEOUS

## 2017-07-10 MED ORDER — ASPIRIN 81 MG PO CHEW: 81.0000 mg | CHEWABLE_TABLET | Freq: Every day | ORAL | Status: DC

## 2017-07-10 MED ORDER — NITROGLYCERIN 1 MG/10 ML FOR IR/CATH LAB
INTRA_ARTERIAL | Status: AC
  Filled 2017-07-10: qty 10

## 2017-07-10 MED ORDER — TICAGRELOR 90 MG PO TABS
90.0000 mg | ORAL_TABLET | Freq: Two times a day (BID) | ORAL | Status: DC
  Administered 2017-07-10 – 2017-07-12 (×4): 90 mg via ORAL
  Filled 2017-07-10 (×4): qty 1

## 2017-07-10 MED ORDER — ROSUVASTATIN CALCIUM 20 MG PO TABS
20.0000 mg | ORAL_TABLET | Freq: Every day | ORAL | Status: DC
  Administered 2017-07-10 – 2017-07-12 (×3): 20 mg via ORAL
  Filled 2017-07-10 (×3): qty 1

## 2017-07-10 MED ORDER — SODIUM CHLORIDE 0.9 % IV SOLN: 250.0000 mL | INTRAVENOUS | Status: DC | PRN

## 2017-07-10 MED ORDER — INSULIN ASPART 100 UNIT/ML FLEXPEN
5.0000 [IU] | PEN_INJECTOR | Freq: Three times a day (TID) | SUBCUTANEOUS | Status: DC
  Administered 2017-07-11: 5 [IU] via SUBCUTANEOUS
  Filled 2017-07-10: qty 3

## 2017-07-10 MED ORDER — ASPIRIN 81 MG PO CHEW: 324.0000 mg | CHEWABLE_TABLET | Freq: Once | ORAL | Status: DC

## 2017-07-10 MED ORDER — ONDANSETRON HCL 4 MG/2ML IJ SOLN
INTRAMUSCULAR | Status: AC
  Administered 2017-07-10: 4 mg via INTRAVENOUS
  Filled 2017-07-10: qty 2

## 2017-07-10 MED ORDER — HEPARIN SODIUM (PORCINE) 5000 UNIT/ML IJ SOLN: 60.0000 [IU]/kg | Freq: Once | INTRAMUSCULAR | Status: DC

## 2017-07-10 MED ORDER — SODIUM CHLORIDE 0.9% FLUSH: 3.0000 mL | INTRAVENOUS | Status: DC | PRN

## 2017-07-10 MED ORDER — HEPARIN SODIUM (PORCINE) 5000 UNIT/ML IJ SOLN
4000.0000 [IU] | Freq: Once | INTRAMUSCULAR | Status: AC
  Administered 2017-07-10: 4000 [IU] via INTRAVENOUS

## 2017-07-10 MED ORDER — MORPHINE SULFATE (PF) 4 MG/ML IV SOLN
1.0000 mg | INTRAVENOUS | Status: DC | PRN
  Administered 2017-07-10 – 2017-07-12 (×3): 4 mg via INTRAVENOUS
  Filled 2017-07-10 (×3): qty 1

## 2017-07-10 MED ORDER — ONDANSETRON HCL 4 MG/2ML IJ SOLN
4.0000 mg | Freq: Once | INTRAMUSCULAR | Status: AC
  Administered 2017-07-10: 4 mg via INTRAVENOUS

## 2017-07-10 MED ORDER — SODIUM CHLORIDE 0.9 % IV SOLN
1.7500 mg/kg/h | INTRAVENOUS | Status: AC
  Filled 2017-07-10: qty 250

## 2017-07-10 MED ORDER — TICAGRELOR 90 MG PO TABS
ORAL_TABLET | ORAL | Status: DC | PRN
  Administered 2017-07-10: 180 mg via ORAL

## 2017-07-10 MED ORDER — LIDOCAINE HCL (PF) 1 % IJ SOLN
INTRAMUSCULAR | Status: DC | PRN
  Administered 2017-07-10: 15 mL

## 2017-07-10 MED ORDER — ADULT MULTIVITAMIN W/MINERALS CH
1.0000 | ORAL_TABLET | Freq: Every day | ORAL | Status: DC
  Administered 2017-07-10 – 2017-07-12 (×3): 1 via ORAL
  Filled 2017-07-10 (×4): qty 1

## 2017-07-10 MED ORDER — IOPAMIDOL (ISOVUE-370) INJECTION 76%
INTRAVENOUS | Status: AC
  Filled 2017-07-10: qty 100

## 2017-07-10 MED ORDER — INFLUENZA VAC SPLIT QUAD 0.5 ML IM SUSY
0.5000 mL | PREFILLED_SYRINGE | INTRAMUSCULAR | Status: AC
  Administered 2017-07-12: 0.5 mL via INTRAMUSCULAR
  Filled 2017-07-10: qty 0.5

## 2017-07-10 MED ORDER — LIDOCAINE HCL 2 % IJ SOLN
INTRAMUSCULAR | Status: AC
  Filled 2017-07-10: qty 10

## 2017-07-10 MED ORDER — GABAPENTIN 300 MG PO CAPS
300.0000 mg | ORAL_CAPSULE | Freq: Every day | ORAL | Status: DC
  Administered 2017-07-10 – 2017-07-11 (×2): 300 mg via ORAL
  Filled 2017-07-10 (×2): qty 1

## 2017-07-10 MED ORDER — ACETAMINOPHEN 325 MG PO TABS
650.0000 mg | ORAL_TABLET | ORAL | Status: DC | PRN
  Administered 2017-07-10 – 2017-07-11 (×4): 650 mg via ORAL
  Filled 2017-07-10 (×4): qty 2

## 2017-07-10 MED ORDER — FENTANYL CITRATE (PF) 100 MCG/2ML IJ SOLN
INTRAMUSCULAR | Status: AC
  Filled 2017-07-10: qty 2

## 2017-07-10 SURGICAL SUPPLY — 18 items
BALLN EMERGE MR 2.25X12 (BALLOONS) ×2
BALLN SAPPHIRE 2.5X20 (BALLOONS) ×2
BALLOON EMERGE MR 2.25X12 (BALLOONS) ×1 IMPLANT
BALLOON SAPPHIRE 2.5X20 (BALLOONS) ×1 IMPLANT
CATH INFINITI 5FR AL1 (CATHETERS) ×2 IMPLANT
CATH INFINITI 5FR MULTPACK ANG (CATHETERS) ×2 IMPLANT
CATH LAUNCHER 6FR AL.75 (CATHETERS) ×2 IMPLANT
CATH LAUNCHER 6FR EBU3.5 (CATHETERS) ×2 IMPLANT
KIT ENCORE 26 ADVANTAGE (KITS) ×2 IMPLANT
KIT HEART LEFT (KITS) ×2 IMPLANT
PACK CARDIAC CATHETERIZATION (CUSTOM PROCEDURE TRAY) ×2 IMPLANT
SHEATH PINNACLE 6F 10CM (SHEATH) ×2 IMPLANT
STENT SIERRA 3.00 X 15 MM (Permanent Stent) ×2 IMPLANT
TRANSDUCER W/STOPCOCK (MISCELLANEOUS) ×2 IMPLANT
TUBING CIL FLEX 10 FLL-RA (TUBING) ×2 IMPLANT
VALVE GUARDIAN II ~~LOC~~ HEMO (MISCELLANEOUS) ×2 IMPLANT
WIRE ASAHI PROWATER 180CM (WIRE) ×2 IMPLANT
WIRE EMERALD ST .035X150CM (WIRE) ×2 IMPLANT

## 2017-07-10 NOTE — ED Notes (Signed)
emtala reviewed by charge RN 

## 2017-07-10 NOTE — ED Notes (Signed)
Pt place on 2L [place].

## 2017-07-10 NOTE — Progress Notes (Signed)
ANTICOAGULATION CONSULT NOTE - Initial Consult  Pharmacy Consult for Heparin Indication: chest pain/ACS  Allergies  Allergen Reactions  . Nitroglycerin Nausea And Vomiting    Patient Measurements: Height: 6' (182.9 cm) Weight: 200 lb (90.7 kg) IBW/kg (Calculated) : 77.6 Heparin Dosing Weight: 90.7 kg  Vital Signs: Temp: 97.5 F (36.4 C) (09/30 1130) Temp Source: Oral (09/30 1130) BP: 137/92 (09/30 1137) Pulse Rate: 95 (09/30 1137)  Labs:  Recent Labs  07/10/17 1131  HGB 14.2  HCT 40.3  PLT 249    CrCl cannot be calculated (Patient's most recent lab result is older than the maximum 21 days allowed.).   Medical History: Past Medical History:  Diagnosis Date  . Chest pain, unspecified   . Coronary atherosclerosis of artery bypass graft    Age 29 - [place]  . HLD (hyperlipidemia)    poorly controlled  . HTN (hypertension)   . Hx-TIA (transient ischemic attack) 2009   possible-([facility]) Normal MRI of brain and MRA of the head and neck  . Kidney stone   . MI, old   . Noncompliance   . Obesity, unspecified   . Uncontrolled diabetes mellitus with complications (HCC) 2002   poorly controlled    Assessment: Patient is a 36yo male admitted for chest pain/ACS/ Pharmacy consulted for Heparin dosing.  Goal of Therapy:  Heparin level 0.3-0.7 units/ml Monitor platelets by anticoagulation protocol: Yes   Plan:  Give 4000 units bolus x 1 Start heparin infusion at 1100 units/hr Check anti-Xa level in 6 hours and daily while on heparin Continue to monitor H&H and platelets   Clovia Cuff, PharmD, BCPS 07/10/2017 11:54 AM   Foye Deer 07/10/2017,11:51 AM

## 2017-07-10 NOTE — ED Notes (Signed)
Family at bedside. 

## 2017-07-10 NOTE — ED Notes (Addendum)
Pt states CP since Monday, worsening today. Pale, diaphoretic, radiating down left arm. PT took 2 baby ASA today. Hx of CABG, unable to state when. Pt breathing 28-30 times per minute, appears anxious.

## 2017-07-10 NOTE — ED Notes (Signed)
PT left with [facility] staff on stretcher. Left facility with heparin infusing. Pt in NAD at this time.

## 2017-07-10 NOTE — ED Notes (Signed)
Report to Noralyn Pick, RN in [employer].

## 2017-07-10 NOTE — ED Notes (Signed)
The EKG was completed and signed by Dr. Paduchowski. The EKG was also exported into the system. 

## 2017-07-10 NOTE — ED Notes (Signed)
ED Provider at bedside. 

## 2017-07-10 NOTE — Progress Notes (Signed)
Pt HS CBG 286; HS coverage added per Dr. Mayford Knife with cardiology. Will continue to monitor.  Herma Ard, RN

## 2017-07-10 NOTE — ED Triage Notes (Signed)
Patient c/o chest pain since Monday, worsening today. Centralized cp with left arm radiation, nausea, and lightheadedness. Patient was pale and cool in triage (presycopal)

## 2017-07-10 NOTE — ED Notes (Addendum)
Troponin 3.79  Verbal readback to Dr. Lenard Lance. Pt en route transport with [facility].

## 2017-07-10 NOTE — ED Provider Notes (Signed)
[facility] Emergency Department Provider Note  Time seen: 11:47 AM  I have reviewed the triage vital signs and the nursing notes.   HISTORY  Chief Complaint Chest Pain    HPI [name] is a 36 y.o. male With a past medical history of MI, with coronary artery bypass at 36 years old and 2007, hypertension, hyperlipidemia, reported noncompliance, presents the emergency department for chest pain. According to the patient is only daily medication he takes is a baby aspirin, he took 2 baby aspirin this morning. He states for the past one week he has been spitting intermittent chest pain that comes and goes. He states this morning the chest pain became severe along with left arm radiation nausea and diaphoresis and shortness of breath so he came to the emergency department. Patient presents from triage with a suspicious EKG, could STEMI activated.  Past Medical History:  Diagnosis Date  . Chest pain, unspecified   . Coronary atherosclerosis of artery bypass graft    Age 39 - [place]  . HLD (hyperlipidemia)    poorly controlled  . HTN (hypertension)   . Hx-TIA (transient ischemic attack) 2009   possible-([facility]) Normal MRI of brain and MRA of the head and neck  . Kidney stone   . MI, old   . Noncompliance   . Obesity, unspecified   . Uncontrolled diabetes mellitus with complications (HCC) 2002   poorly controlled    Patient Active Problem List   Diagnosis Date Noted  . Chest pain 09/28/2015  . Non compliance with medical treatment 03/24/2015  . Left flank pain 03/24/2015  . Cervical radiculitis 09/04/2014  . Acute prostatitis 07/20/2014  . Diabetes mellitus type 2 with complications, uncontrolled (HCC) 02/13/2014  . Macular edema 02/13/2014  . Mixed hyperlipidemia 11/08/2013  . Left shoulder pain 11/08/2013  . Financial difficulties 11/08/2013  . Depression with anxiety 11/08/2013  . Adjustment disorder with anxiety 10/26/2012  . Headache(784.0)  05/04/2011  . OBESITY 12/22/2010  . Atypical chest pain 03/05/2009  . Hyperlipidemia 03/03/2009  . Essential hypertension 03/03/2009  . CAD, ARTERY BYPASS GRAFT 03/03/2009    Past Surgical History:  Procedure Laterality Date  . CORONARY ARTERY BYPASS GRAFT  2007   x 4 (age 59)    Prior to Admission medications   Medication Sig Start Date End Date Taking? Authorizing Provider  aspirin EC 81 MG tablet Take 81 mg by mouth daily.    [provider]  doxycycline (DORYX) 100 MG EC tablet Take 100 mg by mouth 2 (two) times daily.    [provider]  gabapentin (NEURONTIN) 300 MG capsule Take 300 mg by mouth at bedtime. 02/22/17 02/22/18  [provider]  insulin aspart (NOVOLOG) 100 UNIT/ML FlexPen Inject 5 Units into the skin 3 (three) times daily before meals. 02/22/17 02/22/18  [provider]  Insulin Detemir (LEVEMIR [place]) Inject 15 Units into the skin at bedtime. 02/22/17 02/22/18  [provider]  losartan (COZAAR) 25 MG tablet Take 25 mg by mouth at bedtime. 02/22/17 02/22/18  [provider]  metFORMIN (GLUCOPHAGE) 1000 MG tablet Take 2,000 mg by mouth 2 (two) times daily.  02/22/17   [provider]  metoprolol tartrate (LOPRESSOR) 25 MG tablet Take 1 tablet (25 mg total) by mouth 2 (two) times daily. 03/22/17 03/22/18  Antonieta Iba, MD  Multiple Vitamin (MULTIVITAMIN) tablet Take 2 tablets by mouth daily.     [provider]  rosuvastatin (CRESTOR) 20 MG tablet  Take 1 tablet (20 mg total) by mouth daily. 03/22/17 03/22/18  Antonieta Iba, MD    Allergies  Allergen Reactions  . Nitroglycerin Nausea And Vomiting    Family History  Problem Relation Age of Onset  . Emphysema Father        + smoker  . COPD Father        Died age 59  . Coronary artery disease Maternal Grandfather   . Heart attack Maternal Grandfather   . Diabetes Maternal Grandfather   . Hypertension Maternal Grandfather   . Hyperlipidemia  Maternal Grandfather   . Heart disease Maternal Grandfather        CAD  . Lung cancer Unknown        paternal uncles and aunts (all smokers)  . Coronary artery disease Unknown        premature-family history    Social History Social History  Substance Use Topics  . Smoking status: Former Smoker    Packs/day: 2.00    Years: 14.00    Types: Cigarettes    Quit date: 10/11/2004  . Smokeless tobacco: Former Neurosurgeon    Types: Chew, Snuff     Comment: used to smoke 1 1/2 ppd  . Alcohol use 1.2 oz/week    2 Cans of beer per week     Comment: 7-8 lite beers per month    Review of Systems Constitutional: Negative for fever. Cardiovascular: positive for chest pain Respiratory: positive for shortness of breath Gastrointestinal: Negative for abdominal pain Musculoskeletal: negative for leg pain or swelling. Neurological: Negative for headache All other ROS negative  ____________________________________________   PHYSICAL EXAM:  VITAL SIGNS: ED Triage Vitals  Enc Vitals Group     BP 07/10/17 1130 114/78     Pulse Rate 07/10/17 1130 90     Resp 07/10/17 1130 (!) 26     Temp 07/10/17 1130 (!) 97.5 F (36.4 C)     Temp Source 07/10/17 1130 Oral     SpO2 07/10/17 1130 99 %     Weight 07/10/17 1142 200 lb (90.7 kg)     Height 07/10/17 1142 6' (1.829 m)     Head Circumference --      Peak Flow --      Pain Score 07/10/17 1127 10     Pain Loc --      Pain Edu? --      Excl. in GC? --     Constitutional: Alert and oriented. anxious appearing, mildly diaphoretic, somewhat pale. Eyes: Normal exam ENT   Head: Normocephalic and atraumatic   Mouth/Throat: Mucous membranes are moist. Cardiovascular: Normal rate, regular rhythm. No murmur Respiratory: Normal respiratory effort without tachypnea nor retractions. Breath sounds are clear Gastrointestinal: Soft and nontender. No distention.  Musculoskeletal: Nontender with normal range of motion in all extremities. No lower  extremity tenderness or edema. Neurologic:  Normal speech and language. No gross focal neurologic deficits Skin:  Skin is warm, mild diaphoresis, somewhat pale appearing. Psychiatric: Mood and affect are normal.  ____________________________________________    EKG  EKG reviewed and interpreted by myself shows normal sinus rhythm at 90 bpm with a narrow QRS and normal axis, slight elevation in leads 1 and aVL with elevation in V2. Possible reciprocal changes in lead 3.consistent with STEMI  EKG #2 11:34:02 reviewed and interpreted by myself shows normal sinus rhythm at 90 bpm with a narrow QRS, normal axis, normal intervals, ST elevation in leads 1, aVL, V2 with reciprocal depressions in leads  3 and aVF.consistent with STEMI  ____________________________________________  INITIAL IMPRESSION / ASSESSMENT AND PLAN / ED COURSE  Pertinent labs & imaging results that were available during my care of the patient were reviewed by me and considered in my medical decision making (see chart for details).  patient presents to the emergency department with chest pain. History of MI and coronary artery bypass graft in 2007. EKG consistent with STEMI. Differential this time would include ACS, STEMI. Cardiologist at [facility] is currently in the catheterization lab with another patient who presented minutes before this patient. As the cardiologist does not readily available given the concerning EKG he has asked [place] to transfer this patient to another facility. Patient has a history of CABG at [facility], I have discussed the patient with the cardiologist at [facility], who has accepted the patient. We will send via critical care truck, emergency traffic for cardiac catheterization. Cardiologist would like [place] to start a heparin drip. I called pharmacy and requested a stat heparin drip for the patient. We have dosed an additional 2 baby aspirin in the emergency department and given 4000 units of heparin  bolus.  CRITICAL CARE Performed by: Minna Antis   Total critical care time: 45 minutes  Critical care time was exclusive of separately billable procedures and treating other patients.  Critical care was necessary to treat or prevent imminent or life-threatening deterioration.  Critical care was time spent personally by me on the following activities: development of treatment plan with patient and/or surrogate as well as nursing, discussions with consultants, evaluation of patient's response to treatment, examination of patient, obtaining history from patient or surrogate, ordering and performing treatments and interventions, ordering and review of laboratory studies, ordering and review of radiographic studies, pulse oximetry and re-evaluation of patient's condition.  [facility] arrived to transport the patient. Heparin is hanging.  after patient had left troponin resulted 3.79 ____________________________________________   FINAL CLINICAL IMPRESSION(S) / ED DIAGNOSES  Lateral STEMI    Minna Antis, MD 07/10/17 1453

## 2017-07-10 NOTE — H&P (Signed)
Admit date: 07/10/2017 Referring Physician Dr. Lenard Lance Primary Cardiologist Dr.Gollan Chief complaint/reason for admission: Chest discomfort/abnormal ECG  HPI: 36 year old man with premature coronary artery disease. He had bypass surgery many years ago along with PCI. He has a history of poorly controlled diabetes and tobacco abuse. He did stop smoking several years ago. His last cardiac cath in 2010 showed patent grafts. He had a stress test in September 2014 showing no ischemia.  Over the past 5 days, he has had some intermittent chest discomfort. He got worse this morning so he came to the emergency room. Initial ECG showed acute lateral ST elevation consistent with MI. He was at [facility]. Since there Cath Lab was busy with another patient, he was transferred to [facility].  Upon arrival, blood pressure was stable but he was still having 9 out of 10 chest discomfort. He denied any bleeding issues. He thinks he perhaps had a TIA in the past.    PMH:    Past Medical History:  Diagnosis Date  . Chest pain, unspecified   . Coronary atherosclerosis of artery bypass graft    Age 10 - [place]  . HLD (hyperlipidemia)    poorly controlled  . HTN (hypertension)   . Hx-TIA (transient ischemic attack) 2009   possible-([facility]) Normal MRI of brain and MRA of the head and neck  . Kidney stone   . MI, old   . Noncompliance   . Obesity, unspecified   . Uncontrolled diabetes mellitus with complications (HCC) 2002   poorly controlled    PSH:    Past Surgical History:  Procedure Laterality Date  . CORONARY ARTERY BYPASS GRAFT  2007   x 4 (age 49)    ALLERGIES:   Nitroglycerin  Prior to Admit Meds:   Prescriptions Prior to Admission  Medication Sig Dispense Refill Last Dose  . aspirin EC 81 MG tablet Take 81 mg by mouth daily.   07/10/2017 at Unknown time  . gabapentin (NEURONTIN) 300 MG capsule Take 300 mg by mouth at bedtime.   Past Month at Unknown time  . losartan  (COZAAR) 25 MG tablet Take 25 mg by mouth at bedtime.   Past Month at Unknown time  . metFORMIN (GLUCOPHAGE) 1000 MG tablet Take 2,000 mg by mouth 2 (two) times daily.    Past Month at Unknown time  . metoprolol tartrate (LOPRESSOR) 25 MG tablet Take 1 tablet (25 mg total) by mouth 2 (two) times daily. 60 tablet 11 Past Month at Unknown time  . Multiple Vitamin (MULTIVITAMIN) tablet Take 2 tablets by mouth daily.    Past Month at Unknown time  . rosuvastatin (CRESTOR) 20 MG tablet Take 1 tablet (20 mg total) by mouth daily. 30 tablet 11 Past Month at Unknown time  . insulin aspart (NOVOLOG) 100 UNIT/ML FlexPen Inject 5 Units into the skin 3 (three) times daily before meals.   Not Taking  . Insulin Detemir (LEVEMIR [place]) Inject 15 Units into the skin at bedtime.   Not Taking   Family HX:    Family History  Problem Relation Age of Onset  . Emphysema Father        + smoker  . COPD Father        Died age 31  . Coronary artery disease Maternal Grandfather   . Heart attack Maternal Grandfather   . Diabetes Maternal Grandfather   . Hypertension Maternal Grandfather   . Hyperlipidemia Maternal Grandfather   . Heart disease  Maternal Grandfather        CAD  . Lung cancer Unknown        paternal uncles and aunts (all smokers)  . Coronary artery disease Unknown        premature-family history   Social HX:    Social History   Social History  . Marital status: Married    Spouse name: N/A  . Number of children: 1  . Years of education: N/A   Occupational History  . disability for heart condition    Social History Main Topics  . Smoking status: Former Smoker    Packs/day: 2.00    Years: 14.00    Types: Cigarettes    Quit date: 10/11/2004  . Smokeless tobacco: Former Neurosurgeon    Types: Chew, Snuff     Comment: used to smoke 1 1/2 ppd  . Alcohol use 1.2 oz/week    2 Cans of beer per week     Comment: 7-8 lite beers per month  . Drug use: No  . Sexual activity: Not on file   Other Topics  Concern  . Not on file   Social History Narrative      Caffeine: 3 cups coffee ~ 4 times per week, 2L diet soda per day      Lives with wife and son (31 y/o)      Lives in [place]      On disability      Exercises - walk, push ups, sit-ups. Coaches baseball. Hunting.           ROS:  All 11 ROS were addressed and are negative except what is stated in the HPI  PHYSICAL EXAM Vitals:   07/10/17 1250  SpO2: 99%   General: Well developed, well nourished, in no acute distress Head: Eyes PERRLA, No xanthomas.   Normal cephalic and atramatic  Lungs:   Clear bilaterally to auscultation and percussion. Heart:   HRRR S1 S2 , dorsalis pedis Pulses are 2+ & equal.             Abdomen: Nondistended Msk:  Normal strength and tone for age. Extremities:   No clubbing, cyanosis or edema.  DP +1 Neuro: Alert and oriented X 3. Psych:  Good affect, responds appropriately Skin: Multiple tattoos  Labs:   Lab Results  Component Value Date   WBC 7.7 07/10/2017   HGB 14.2 07/10/2017   HCT 40.3 07/10/2017   MCV 84.5 07/10/2017   PLT 249 07/10/2017    Recent Labs Lab 07/10/17 1131  NA 133*  K 4.5  CL 100*  CO2 22  BUN 14  CREATININE 0.78  CALCIUM 9.1  PROT 7.3  BILITOT 0.7  ALKPHOS 60  ALT 22  AST 29  GLUCOSE 267*   Lab Results  Component Value Date   CKTOTAL 106 01/31/2016   CKMB 0.9 06/11/2013   TROPONINI 3.79 (HH) 07/10/2017   No results found for: PTT Lab Results  Component Value Date   INR 0.96 07/10/2017   INR 0.93 06/26/2015   INR 1.0 07/18/2014     Lab Results  Component Value Date   CHOL 253 (H) 07/10/2017   CHOL 258 (H) 11/12/2013   CHOL 309 (H) 06/11/2013   Lab Results  Component Value Date   HDL 33 (L) 07/10/2017   HDL 40.10 11/12/2013   HDL 37 (L) 06/11/2013   Lab Results  Component Value Date   LDLCALC 190 (H) 07/10/2017   LDLCALC 194 (H) 06/11/2013  LDLCALC 69 12/28/2010   Lab Results  Component Value Date   TRIG 148 07/10/2017     TRIG 250.0 (H) 11/12/2013   TRIG 390 (H) 06/11/2013   Lab Results  Component Value Date   CHOLHDL 7.7 07/10/2017   CHOLHDL 6 11/12/2013   CHOLHDL 3.1 Ratio 12/28/2010   Lab Results  Component Value Date   LDLDIRECT 175.2 11/12/2013   LDLDIRECT 210.0 03/05/2009      Radiology:  No results found.  EKG:   Normal sinus rhythm with lateral ST elevation ASSESSMENT: [name]:    1) Acute lateral wall MI: Status post PTCA to circumflex and stent placement in SVG to OM. Chest pain significantly reduced. He will need aggressive secondary prevention including dual antiplatelet therapy.  Beta blocker started.  Check echo.  2) diabetes: Poorly controlled. He has been unable to afford insulin. Will need case manager consult.  3) hyperlipidemia: Resume Crestor. Compliance will be his main issue.  He'll be watched in the ICU with further plans based on his clinical course.  Lance Muss, MD  07/10/2017  2:54 PM

## 2017-07-10 NOTE — ED Notes (Signed)
Patient intercepted at doorway in triage, presyncopal. Patient moved to ECG room stat for concerning MI symptoms.

## 2017-07-11 ENCOUNTER — Encounter (HOSPITAL_COMMUNITY): Payer: Self-pay | Admitting: Interventional Cardiology

## 2017-07-11 DIAGNOSIS — E782 Mixed hyperlipidemia: Secondary | ICD-10-CM

## 2017-07-11 DIAGNOSIS — E1159 Type 2 diabetes mellitus with other circulatory complications: Secondary | ICD-10-CM

## 2017-07-11 LAB — CBC
HCT: 35.5 % — ABNORMAL LOW (ref 39.0–52.0)
HEMOGLOBIN: 12.1 g/dL — AB (ref 13.0–17.0)
MCH: 28.8 pg (ref 26.0–34.0)
MCHC: 34.1 g/dL (ref 30.0–36.0)
MCV: 84.5 fL (ref 78.0–100.0)
PLATELETS: 249 10*3/uL (ref 150–400)
RBC: 4.2 MIL/uL — ABNORMAL LOW (ref 4.22–5.81)
RDW: 11.8 % (ref 11.5–15.5)
WBC: 6.9 10*3/uL (ref 4.0–10.5)

## 2017-07-11 LAB — BASIC METABOLIC PANEL
ANION GAP: 5 (ref 5–15)
BUN: 7 mg/dL (ref 6–20)
CALCIUM: 8.5 mg/dL — AB (ref 8.9–10.3)
CO2: 26 mmol/L (ref 22–32)
CREATININE: 0.81 mg/dL (ref 0.61–1.24)
Chloride: 103 mmol/L (ref 101–111)
GFR calc Af Amer: >60 mL/min (ref >60)
GFR calc non Af Amer: >60 mL/min (ref >60)
GLUCOSE: 280 mg/dL — AB (ref 65–99)
Potassium: 4 mmol/L (ref 3.5–5.1)
Sodium: 134 mmol/L — ABNORMAL LOW (ref 135–145)

## 2017-07-11 LAB — LIPID PANEL
Cholesterol: 217 mg/dL — ABNORMAL HIGH (ref 0–200)
HDL: 24 mg/dL — AB (ref >40)
LDL CALC: 159 mg/dL — AB (ref 0–99)
Total CHOL/HDL Ratio: 9 RATIO
Triglycerides: 170 mg/dL — ABNORMAL HIGH (ref <150)
VLDL: 34 mg/dL (ref 0–40)

## 2017-07-11 LAB — HEMOGLOBIN A1C
HEMOGLOBIN A1C: 12.6 % — AB (ref 4.8–5.6)
Mean Plasma Glucose: 314.92 mg/dL

## 2017-07-11 LAB — GLUCOSE, CAPILLARY
GLUCOSE-CAPILLARY: 247 mg/dL — AB (ref 65–99)
GLUCOSE-CAPILLARY: 92 mg/dL (ref 65–99)
Glucose-Capillary: 243 mg/dL — ABNORMAL HIGH (ref 65–99)
Glucose-Capillary: 114 mg/dL — ABNORMAL HIGH (ref 65–99)
Glucose-Capillary: 309 mg/dL — ABNORMAL HIGH (ref 65–99)

## 2017-07-11 LAB — POCT ACTIVATED CLOTTING TIME: Activated Clotting Time: 301 seconds

## 2017-07-11 LAB — TROPONIN I: Troponin I: 4.23 ng/mL (ref <0.03)

## 2017-07-11 MED ORDER — INSULIN ASPART 100 UNIT/ML ~~LOC~~ SOLN
5.0000 [IU] | Freq: Three times a day (TID) | SUBCUTANEOUS | Status: DC
  Administered 2017-07-11 – 2017-07-12 (×4): 5 [IU] via SUBCUTANEOUS

## 2017-07-11 MED FILL — Lidocaine HCl Local Inj 2%: INTRAMUSCULAR | Qty: 10 | Status: AC

## 2017-07-11 MED FILL — Nitroglycerin IV Soln 100 MCG/ML in D5W: INTRA_ARTERIAL | Qty: 10 | Status: AC

## 2017-07-11 NOTE — Plan of Care (Signed)
Problem: Pain Managment: Goal: General experience of comfort will improve Outcome: Progressing Pt denies pain   

## 2017-07-11 NOTE — Progress Notes (Signed)
Inpatient Diabetes Program Recommendations  AACE/ADA: New Consensus Statement on Inpatient Glycemic Control (2015)  Target Ranges:  Prepandial:   less than 140 mg/dL      Peak postprandial:   less than 180 mg/dL (1-2 hours)      Critically ill patients:  140 - 180 mg/dL   Lab Results  Component Value Date   GLUCAP 243 (H) 07/11/2017   HGBA1C 12.6 (H) 07/11/2017    Review of Glycemic Control Results for DAINE, GUNTHER (MRN [id number]) as of 07/11/2017 13:31  Ref. Range 07/10/2017 11:41 07/10/2017 16:20 07/10/2017 21:24 07/11/2017 08:14 07/11/2017 11:27  Glucose-Capillary Latest Ref Range: 65 - 99 mg/dL 119 (H) 147 (H) 829 (H) 247 (H) 243 (H)   Diabetes history: DM2 Outpatient Diabetes medications: Levemir 15 units + Novolog 5 units tid meal coverage + Metformin bid Current orders for Inpatient glycemic control: Novolog 5 units tid meal coverage + Novolog correction moderate + 0-5 units hs  Inpatient Diabetes Program Recommendations:    -Levemir 15 units daily  Thank you, Darel Hong E. Lleyton Byers, RN, MSN, CDE  Diabetes Coordinator Inpatient Glycemic Control Team Team Pager [phone] (8am-5pm) 07/11/2017 1:34 PM

## 2017-07-11 NOTE — Progress Notes (Signed)
CARDIAC REHAB PHASE I   PRE:  Rate/Rhythm: 95 SR    BP: sitting 113/75    SaO2: 97 RA  MODE:  Ambulation: 380 ft   POST:  Rate/Rhythm: 105 ST    BP: sitting 110/70     SaO2: 98 RA  Pt in recliner. Feels tired and stiff but able to walk. Steady. Did one 1/2 lap then decided he was able to walk another one. VSS, return to recliner. Denied CP. Began discussing MI/stent and seriousness of DM. Pt was receptive but has little understanding of DM and CAD. Asked appropriate questions. Relies on wife for meds. He needs more education and support from PCP (apparently has a new one). He began feeling lightheaded and tired sitting in recliner. BP 95/68. Notified RN and lied pt back to rest. Will continue more education however it would be best to have wife present, esp at d/c.   [id number]  Harriet Masson CES, ACSM 07/11/2017 10:21 AM

## 2017-07-11 NOTE — Plan of Care (Signed)
Problem: Activity: Goal: Risk for activity intolerance will decrease Outcome: Progressing Pt oob and ambulated to chair

## 2017-07-11 NOTE — Progress Notes (Addendum)
Progress Note  Patient Name: [name] Date of Encounter: 07/11/2017  Primary Cardiologist: Dr. Mariah Milling  Subjective   No chest pain  Inpatient Medications    Scheduled Meds: . aspirin EC  81 mg Oral Daily  . gabapentin  300 mg Oral QHS  . Influenza vac split quadrivalent PF  0.5 mL Intramuscular Tomorrow-1000  . insulin aspart  5 Units Subcutaneous TID AC  . insulin aspart  0-15 Units Subcutaneous TID WC  . insulin aspart  0-5 Units Subcutaneous QHS  . losartan  25 mg Oral QHS  . metoprolol tartrate  25 mg Oral BID  . multivitamin with minerals  1 tablet Oral Daily  . pneumococcal 23 valent vaccine  0.5 mL Intramuscular Tomorrow-1000  . rosuvastatin  20 mg Oral Daily  . sodium chloride flush  3 mL Intravenous Q12H  . ticagrelor  90 mg Oral BID   Continuous Infusions: . sodium chloride     PRN Meds: sodium chloride, acetaminophen, morphine injection, ondansetron (ZOFRAN) IV, sodium chloride flush   Vital Signs    Vitals:   07/11/17 0853 07/11/17 0900 07/11/17 1000 07/11/17 1008  BP: 105/74 113/75 95/68 105/75  Pulse: 91 92 93 90  Resp: Temp:      TempSrc:      SpO2: 100% 99% 96% 97%  Weight:      Height:        Intake/Output Summary (Last 24 hours) at 07/11/17 1105 Last data filed at 07/11/17 0900  Gross per 24 hour  Intake          1784.41 ml  Output             1375 ml  Net           409.41 ml   Filed Weights   07/10/17 1445  Weight: 221 lb 9 oz (100.5 kg)    Telemetry    NSR - Personally Reviewed  ECG    pending- Personally Reviewed  Physical Exam   GEN: No acute distress.   Neck: No JVD Cardiac: RRR, no murmurs, rubs, or gallops.  Respiratory: Clear to auscultation bilaterally. GI: Soft, nontender, non-distended  MS: No edema; No deformity.  No hematoma Neuro:  Nonfocal  Psych: Normal affect   Labs    Chemistry Recent Labs Lab 07/10/17 1131 07/11/17 0216  NA 133* 134*  K 4.5 4.0  CL 100* 103  CO2 22 26    GLUCOSE 267* 280*  BUN 14 7  CREATININE 0.78 0.81  CALCIUM 9.1 8.5*  PROT 7.3  --   ALBUMIN 3.3*  --   AST 29  --   ALT 22  --   ALKPHOS 60  --   BILITOT 0.7  --   GFRNONAA >60 >60  GFRAA >60 >60  ANIONGAP 11 5     Hematology Recent Labs Lab 07/10/17 1131 07/11/17 0216  WBC 7.7 6.9  RBC 4.76 4.20*  HGB 14.2 12.1*  HCT 40.3 35.5*  MCV 84.5 84.5  MCH 29.7 28.8  MCHC 35.2 34.1  RDW 12.0 11.8  PLT 249 249    Cardiac Enzymes Recent Labs Lab 07/10/17 1131 07/10/17 1947 07/11/17 0216  TROPONINI 3.79* 4.04* 4.23*   No results for input(s): TROPIPOC in the last 168 hours.   BNPNo results for input(s): BNP, PROBNP in the last 168 hours.   DDimer No results for input(s): DDIMER in the last 168 hours.   Radiology    No results  found.  Cardiac Studies   Cath results reviewed  Patient Profile     36 y.o. male who has premature CAD and CABG at an early age.   Assessment & Plan    1) CAD/MI: s/p PCI to circ and graft to OM. Looking at op report, it states that there is a right radial artery graft to the OM, rather than SVG.  THere is a smaller branch that is occluded from the proximal graft.   He is doing well.  Continue DAPT and aggressive secondary prevention.    DM control will be paramount.  Hyperlipidemia: Resume Crestor for elevated LDL.  Plan on moving out to tele.  He needs case manager to help with getting meds.  Brilinta will be the most important given stent.   For questions or updates, please contact [facility] Please consult [email or web address] for contact info under Cardiology/STEMI.      Signed, Lance Muss, MD  07/11/2017, 11:05 AM

## 2017-07-11 NOTE — Progress Notes (Addendum)
Per insurance check for Brilinta-   Pt does not have part D prescription coverage- and has no medication coverage at this time to cover  Brilinta- pt is eligible for 30 day free card with Brilinta- would need to apply for assistance. -- see CM note 10/1

## 2017-07-11 NOTE — Care Management Note (Addendum)
Case Management Note Donn Pierini RN, BSN Unit 4E-Case Manager-- 2H coverage [phone]  Patient Details  Name: [name] MRN: [id number] Date of Birth: 1981/02/05  Subjective/Objective:  Pt admitted with STEMI s/p PTCA and stent placement                  Action/Plan:  PTA pt lived at home with wife/son- independent- referral for Brilinta received- per insurance check pt does not have prescription drug coverage- spoke with pt and wife at bedside- confirmed with pt that he does not have part D prescription drug coverage or any other type of medication coverage- pt states that he is planning on enrolling for part D during open enrollment however coverage would not start until Jan. 1- if pt remains on Brilinta would need to fill out application for assistance with Brilinta- to bridge until he gets insurance coverage- pt would qualify for 30 day free card-  Would need to see about getting samples from the office if needed past 30 days while assistance application in process-- CM to follow will provide pt with application for assistance for Brilinta and self pay 30 day free card  at bedside- may need to consider alternative drug??  Expected Discharge Date:                  Expected Discharge Plan:  Home/Self Care  In-House Referral:     Discharge planning Services  CM Consult, Medication Assistance  Post Acute Care Choice:    Choice offered to:     DME Arranged:    DME Agency:     HH Arranged:    HH Agency:     Status of Service:  In process, will continue to follow  If discussed at Long Length of Stay Meetings, dates discussed:    Discharge Disposition:   Additional Comments:  Darrold Span, RN 07/11/2017, 12:24 PM

## 2017-07-11 NOTE — Progress Notes (Signed)
Right groin sheath removed by Ebelin Dillehay RN and Jesse Cleaver RN at 1814. Pt educated prior to sheath removal and emergency equipment at the beside. Pressure held for 20 minutes, vital signs stable. After removal site remained a level 0. Pt educated about post sheath care. Will pass information to primary nurse and be available to assist if needed.  

## 2017-07-12 ENCOUNTER — Inpatient Hospital Stay (HOSPITAL_COMMUNITY): Payer: Medicare Other

## 2017-07-12 ENCOUNTER — Telehealth: Payer: Self-pay | Admitting: Cardiovascular Disease

## 2017-07-12 ENCOUNTER — Encounter (HOSPITAL_COMMUNITY): Payer: Self-pay

## 2017-07-12 DIAGNOSIS — I10 Essential (primary) hypertension: Secondary | ICD-10-CM

## 2017-07-12 DIAGNOSIS — I25700 Atherosclerosis of coronary artery bypass graft(s), unspecified, with unstable angina pectoris: Secondary | ICD-10-CM

## 2017-07-12 DIAGNOSIS — E118 Type 2 diabetes mellitus with unspecified complications: Secondary | ICD-10-CM

## 2017-07-12 DIAGNOSIS — I2121 ST elevation (STEMI) myocardial infarction involving left circumflex coronary artery: Secondary | ICD-10-CM

## 2017-07-12 DIAGNOSIS — E1165 Type 2 diabetes mellitus with hyperglycemia: Secondary | ICD-10-CM

## 2017-07-12 DIAGNOSIS — I5042 Chronic combined systolic (congestive) and diastolic (congestive) heart failure: Secondary | ICD-10-CM

## 2017-07-12 LAB — ECHOCARDIOGRAM COMPLETE
Height: 72 in
Weight: 3545 oz

## 2017-07-12 LAB — GLUCOSE, CAPILLARY
GLUCOSE-CAPILLARY: 240 mg/dL — AB (ref 65–99)
GLUCOSE-CAPILLARY: 207 mg/dL — AB (ref 65–99)

## 2017-07-12 MED ORDER — TICAGRELOR 90 MG PO TABS: 90.0000 mg | ORAL_TABLET | Freq: Two times a day (BID) | ORAL | 2 refills | Status: DC

## 2017-07-12 MED ORDER — METOPROLOL TARTRATE 25 MG PO TABS: 25.0000 mg | ORAL_TABLET | Freq: Two times a day (BID) | ORAL | 0 refills | Status: DC

## 2017-07-12 MED ORDER — LOSARTAN POTASSIUM 25 MG PO TABS: 25.0000 mg | ORAL_TABLET | Freq: Every day | ORAL | 0 refills | Status: DC

## 2017-07-12 MED ORDER — ROSUVASTATIN CALCIUM 20 MG PO TABS: 20.0000 mg | ORAL_TABLET | Freq: Every day | ORAL | 0 refills | Status: DC

## 2017-07-12 NOTE — Care Management Note (Signed)
Case Management Note Donn Pierini RN, BSN Unit 4E-Case Manager-- 2H coverage [phone]  Patient Details  Name: [name] MRN: [id number] Date of Birth: [birth year]  Subjective/Objective:  Pt admitted with STEMI s/p PTCA and stent placement                  Action/Plan:  PTA pt lived at home with wife/son- independent- referral for Brilinta received- per insurance check pt does not have prescription drug coverage- spoke with pt and wife at bedside- confirmed with pt that he does not have part D prescription drug coverage or any other type of medication coverage- pt states that he is planning on enrolling for part D during open enrollment however coverage would not start until Jan. 1- if pt remains on Brilinta would need to fill out application for assistance with Brilinta- to bridge until he gets insurance coverage- pt would qualify for 30 day free card-  Would need to see about getting samples from the office if needed past 30 days while assistance application in process-- CM to follow will provide pt with application for assistance for Brilinta and self pay 30 day free card  at bedside- may need to consider alternative drug??  Expected Discharge Date:  07/12/17               Expected Discharge Plan:  Home/Self Care  In-House Referral:     Discharge planning Services  CM Consult, Medication Assistance, MATCH Program  Post Acute Care Choice:  NA Choice offered to:  NA  DME Arranged:    DME Agency:     HH Arranged:    HH Agency:     Status of Service:  Completed, signed off  If discussed at [employer] of Stay Meetings, dates discussed:    Discharge Disposition: home/self care   Additional Comments:  07/12/17- 1400- Saniya Tranchina RN, CM- pt for d/c home today- have reviewed meds and CM will assist pt with medications for discharge- DM educator also speaking with pt regarding insulin/DM needs and changing to 70/30 until pt can get part D coverage- pt will need to f/u with  PCP regarding his insulin needs- spoke with pt and wife- along with Shannon-DM educator at the bedside- [facility] letter provided along with list of pharmacies. MATCH program explained- one time use on discharge with $3 copay cost per script- pt and wife state that they can afford this copay. They have Brilinta 30 day free card to use with paper script on Brilinta for discharge- Will plan to go to [facility] in [place]- Pt's PCP is at the [facility] in [place], [place], [place] 07/12/2017, 2:03 PM

## 2017-07-12 NOTE — Telephone Encounter (Signed)
TCM ph [facility] tx to [facility] for NSTEMI s/p stent  Needs 7-10 days fu  Scheduled 11/12 Dr. Mariah Milling at 740 added to wait list

## 2017-07-12 NOTE — Progress Notes (Signed)
  Echocardiogram 2D Echocardiogram has been performed.  Celene Skeen 07/12/2017, 11:15 AM

## 2017-07-12 NOTE — Telephone Encounter (Signed)
Left voicemail message to call back. Currently admitted

## 2017-07-12 NOTE — Progress Notes (Signed)
Inpatient Diabetes Program Recommendations  AACE/ADA: New Consensus Statement on Inpatient Glycemic Control (2015)  Target Ranges:  Prepandial:   less than 140 mg/dL      Peak postprandial:   less than 180 mg/dL (1-2 hours)      Critically ill patients:  140 - 180 mg/dL   Inpatient Diabetes Program Recommendations:    Spoke with patient about diabetes and home regimen for diabetes control. Patient reports that he is followed by his PCP at the [facility] for diabetes management and stated he was not taking his insulin nor checking his glucose levels. Discussed with patient his interest in the New Continuous glucose monitor for $75 out of pocket. Patient reported trying 70/30 in the past but taking larger doses and having lower glucose levels.  Discussed A1C results (12.6%). A1c has been elevated the past few yesra when looking back in patients chart. Discussed glucose and A1C goals. Discussed importance of checking CBGs and maintaining good CBG control to prevent long-term and short-term complications. Discussed impact of nutrition, exercise, stress, sickness, and medications on diabetes control.Patient verbalized understanding of information discussed and he states that he has no further questions at this time related to diabetes.   Thanks,  Christena Deem RN, MSN, [facility] Inpatient Diabetes Coordinator Team Pager [phone] (8a-5p)

## 2017-07-12 NOTE — Discharge Summary (Signed)
Discharge Summary    Patient ID: [name],  MRN: [id number], DOB/AGE: 01-13-1981 36 y.o.  Admit date: 07/10/2017 Discharge date: 07/12/2017  Primary Care Provider: Marisue Ivan Primary Cardiologist: Mariah Milling   Discharge Diagnoses    Active Problems:   Acute MI, lateral wall (HCC)   Essential hypertension   CAD, ARTERY BYPASS GRAFT   Mixed hyperlipidemia   Diabetes mellitus type 2 with complications, uncontrolled (HCC)   Acute systolic heart failure (HCC)   Allergies Allergies  Allergen Reactions  . Nitroglycerin Nausea And Vomiting    Diagnostic Studies/Procedures    Cath: 07/10/17  Conclusion     Ost LM to LM lesion, 50 %stenosed. Severe three vessel CAD.  Prox RCA to Mid RCA lesion, 100 %stenosed. SVG to PDA is patent.  Prox Cx to Mid Cx lesion, 70 %stenosed. This is calcified instent restenosis and is likely old.  2nd Mrg lesion, 75 %stenosed.  Ost LAD lesion, 100 %stenosed. LIMA to LAD is patent but LAD distal to anastamosis is heavily diseased.  RIMA to diagonal, 100 %stenosed. RIMA is a Y graft off of the SVG to OM.  LV end diastolic pressure is moderately elevated.  There is no aortic valve stenosis.  Ost Cx to Prox Cx lesion, 95 %stenosed. Post intervention with 2.5 balloon, there is a 10% residual stenosis.  Origin lesion of SVG to OM, 80 %stenosed. A STENT SIERRA 3.00 X 15 MM drug eluting stent was successfully placed.  Post intervention, there is a 0% residual stenosis.   Continue dual antiplatelet therapy along with aggressive secondary prevention. He will need a case manager consult to help with affording medicines. He has not been able to afford his insulin. Will need to be sure that he stays on his dual antiplatelet therapy. If Brilinta turns out to be too expensive, could switch to clopidogrel.   TTE: 07/12/17  Study Conclusions  - Left ventricle: The cavity size was normal. Wall thickness was   normal. Systolic function was  moderately reduced. The estimated   ejection fraction was in the range of 35% to 40%. Akinesis of the   apicalanteroseptal and apical myocardium. Features are consistent   with a pseudonormal left ventricular filling pattern, with   concomitant abnormal relaxation and increased filling pressure   (grade 2 diastolic dysfunction). - Right ventricle: Systolic function was mildly to moderately   reduced. _____________   History of Present Illness     36 year old man with premature coronary artery disease. He had bypass surgery many years ago along with PCI. He has a history of poorly controlled diabetes and tobacco abuse. He did stop smoking several years ago. His last cardiac cath in 2010 showed patent grafts. He had a stress test in September 2014 showing no ischemia.  Over the past 5 days prior to admission, he had some intermittent chest discomfort. He got worse the morning of admission so he came to the emergency room. Initial ECG showed acute lateral ST elevation consistent with MI. He was at [facility] ER. Since there Cath Lab was busy with another patient, he was transferred to [facility]  Upon arrival, blood pressure was stable but he was still having 9 out of 10 chest discomfort. He denied any bleeding issues. He thinks he perhaps had a TIA in the past.  Hospital Course     Underwent cardiac cath noted above with 3/4 patent grafts, occluded SVG--> Diag, with 80% lesion in the SVG-->OM treated with PCI/DES x1, and PCI  to Circ. Plan for DAPT with ASA/Brilinta. Noted to have issues with obtaining medications in the past and uncontrolled DM. Also instructed on smoking cessation. He was restarted on Crestor, Metoprolol and losartan. CM assisted with medications this admission and plan for the [facility] program. Follow up echo showed an EF of 35-40% with akinesis in the apicalanteroseptal region, with G2DD. Troponin peaked at 4.23. LDL 159, Hgb A1c 12.6. He was instructed for close follow up with  PCP regarding further diabetes management. He was given 30 day Brilinta card, but may need to switch to plavix after 30 days if unable to obtain assistance.    Joesph Fillers was seen by Dr. Eldridge Dace and determined stable for discharge home. Follow up in the office has been arranged. Medications are listed below.   _____________  Discharge Vitals Blood pressure 129/84, pulse 86, temperature 97.8 F (36.6 C), temperature source Oral, resp. rate 16, height 6' (1.829 m), weight 221 lb 9 oz (100.5 kg), SpO2 98 %.  Filed Weights   07/10/17 1445  Weight: 221 lb 9 oz (100.5 kg)    Labs & Radiologic Studies    CBC  Recent Labs  07/10/17 1131 07/11/17 0216  WBC 7.7 6.9  NEUTROABS 3.5  --   HGB 14.2 12.1*  HCT 40.3 35.5*  MCV 84.5 84.5  PLT 249 249   Basic Metabolic Panel  Recent Labs  07/10/17 1131 07/11/17 0216  NA 133* 134*  K 4.5 4.0  CL 100* 103  CO2 22 26  GLUCOSE 267* 280*  BUN 14 7  CREATININE 0.78 0.81  CALCIUM 9.1 8.5*   Liver Function Tests  Recent Labs  07/10/17 1131  AST 29  ALT 22  ALKPHOS 60  BILITOT 0.7  PROT 7.3  ALBUMIN 3.3*   No results for input(s): LIPASE, AMYLASE in the last 72 hours. Cardiac Enzymes  Recent Labs  07/10/17 1131 07/10/17 1947 07/11/17 0216  TROPONINI 3.79* 4.04* 4.23*   BNP Invalid input(s): POCBNP D-Dimer No results for input(s): DDIMER in the last 72 hours. Hemoglobin A1C  Recent Labs  07/11/17 0216  HGBA1C 12.6*   Fasting Lipid Panel  Recent Labs  07/11/17 0216  CHOL 217*  HDL 24*  LDLCALC 159*  TRIG 170*  CHOLHDL 9.0   Thyroid Function Tests No results for input(s): TSH, T4TOTAL, T3FREE, THYROIDAB in the last 72 hours.  Invalid input(s): FREET3 _____________  No results found. Disposition   Pt is being discharged home today in good condition.  Follow-up Plans & Appointments    Follow-up Information    Antonieta Iba, MD Follow up on 08/22/2017.   Specialty:  Cardiology Why:  at  7:40am for your follow up appt. Office will call you with a sooner follow up appt.  Contact information: [address] [place] [address] [phone]        Marisue Ivan, MD Follow up.   Specialty:  Family Medicine Why:  Please arrange follow up regarding further diabetes management.  Contact information: [address] [facility] [place] [address] [phone]          Discharge Instructions    (HEART FAILURE PATIENTS) Call MD:  Anytime you have any of the following symptoms: 1) 3 pound weight gain in 24 hours or 5 pounds in 1 week 2) shortness of breath, with or without a dry hacking cough 3) swelling in the hands, feet or stomach 4) if you have to sleep on extra pillows at  night in order to breathe.    Complete by:  As directed    Amb Referral to Cardiac Rehabilitation    Complete by:  As directed    Diagnosis:   Coronary Stents PTCA STEMI     Call MD for:  redness, tenderness, or signs of infection (pain, swelling, redness, odor or green/yellow discharge around incision site)    Complete by:  As directed    Diet - low sodium heart healthy    Complete by:  As directed    Discharge instructions    Complete by:  As directed    Radial Site Care Refer to this sheet in the next few weeks. These instructions provide you with information on caring for yourself after your procedure. Your caregiver may also give you more specific instructions. Your treatment has been planned according to current medical practices, but problems sometimes occur. Call your caregiver if you have any problems or questions after your procedure. HOME CARE INSTRUCTIONS You may shower the day after the procedure.Remove the bandage (dressing) and gently wash the site with plain soap and water.Gently pat the site dry.  Do not apply powder or lotion to the site.  Do not submerge the affected site in water for 3 to 5 days.  Inspect the site at least twice daily.    Do not flex or bend the affected arm for 24 hours.  No lifting over 5 pounds (2.3 kg) for 5 days after your procedure.  Do not drive home if you are discharged the same day of the procedure. Have someone else drive you.  You may drive 24 hours after the procedure unless otherwise instructed by your caregiver.  What to expect: Any bruising will usually fade within 1 to 2 weeks.  Blood that collects in the tissue (hematoma) may be painful to the touch. It should usually decrease in size and tenderness within 1 to 2 weeks.  SEEK IMMEDIATE MEDICAL CARE IF: You have unusual pain at the radial site.  You have redness, warmth, swelling, or pain at the radial site.  You have drainage (other than a small amount of blood on the dressing).  You have chills.  You have a fever or persistent symptoms for more than 72 hours.  You have a fever and your symptoms suddenly get worse.  Your arm becomes pale, cool, tingly, or numb.  You have heavy bleeding from the site. Hold pressure on the site.   PLEASE DO NOT MISS ANY DOSES OF YOUR BRILINTA!!!!! Also keep a log of you blood pressures and bring back to your follow up appt. Please call the office with any questions.   Patients taking blood thinners should generally stay away from medicines like ibuprofen, Advil, Motrin, naproxen, and Aleve due to risk of stomach bleeding. You may take Tylenol as directed or talk to your primary doctor about alternatives.  For patients with congestive heart failure, we give them these special instructions:  1. Follow a low-salt diet and watch your fluid intake. In general, you should not be taking in more than 2 liters of fluid per day (no more than 8 glasses per day). Some patients are restricted to less than 1.5 liters of fluid per day (no more than 6 glasses per day). This includes sources of water in foods like soup, coffee, tea, milk, etc. 2. Weigh yourself on the same scale at same time of day and keep a log. 3. Call  your doctor: (Anytime you feel any of the following  symptoms)  - 3-4 pound weight gain in 1-2 days or 2 pounds overnight  - Shortness of breath, with or without a dry hacking cough  - Swelling in the hands, feet or stomach  - If you have to sleep on extra pillows at night in order to breathe   IT IS IMPORTANT TO LET YOUR DOCTOR KNOW EARLY ON IF YOU ARE HAVING SYMPTOMS SO WE CAN HELP YOU!   Increase activity slowly    Complete by:  As directed       Discharge Medications     Medication List    TAKE these medications   aspirin EC 81 MG tablet Take 81 mg by mouth daily.   gabapentin 300 MG capsule Commonly known as:  NEURONTIN Take 300 mg by mouth at bedtime.   insulin aspart 100 UNIT/ML FlexPen Commonly known as:  NOVOLOG Inject 5 Units into the skin 3 (three) times daily before meals.   LEVEMIR Cecilia Inject 15 Units into the skin at bedtime.   losartan 25 MG tablet Commonly known as:  COZAAR Take 1 tablet (25 mg total) by mouth at bedtime.   metFORMIN 1000 MG tablet Commonly known as:  GLUCOPHAGE Take 2,000 mg by mouth 2 (two) times daily.   metoprolol tartrate 25 MG tablet Commonly known as:  LOPRESSOR Take 1 tablet (25 mg total) by mouth 2 (two) times daily.   multivitamin tablet Take 2 tablets by mouth daily.   rosuvastatin 20 MG tablet Commonly known as:  CRESTOR Take 1 tablet (20 mg total) by mouth daily.   ticagrelor 90 MG Tabs tablet Commonly known as:  BRILINTA Take 1 tablet (90 mg total) by mouth 2 (two) times daily.        Aspirin prescribed at discharge?  Yes High Intensity Statin Prescribed? (Lipitor 40-80mg  or Crestor 20-40mg ): Yes Beta Blocker Prescribed? Yes For EF <40%, was ACEI/ARB Prescribed? Yes ADP Receptor Inhibitor Prescribed? (i.e. Plavix etc.-Includes Medically Managed Patients): Yes For EF <40%, Aldosterone Inhibitor Prescribed? No: consider at follow up if appropriate Was EF assessed during THIS hospitalization? Yes Was Cardiac  Rehab II ordered? (Included Medically managed Patients): Yes   Outstanding Labs/Studies   FLP/LFTs in 6 weeks if tolerating statin.   Duration of Discharge Encounter   Greater than 30 minutes including physician time.  Signed, Laverda Page NP-C 07/12/2017, 1:52 PM   I have examined the patient and reviewed assessment and plan and discussed with patient.  Agree with above as stated.  Stressed importance of DAPT and DM control.  Patient assistance forms given to the patient.  Please see my note from earlier today.  Patient walked without any difficulty today.  Discharge with f/u with Dr. Mariah Milling.   Lance Muss

## 2017-07-12 NOTE — Progress Notes (Signed)
Progress Note  Patient Name: [name] Date of Encounter: 07/12/2017  Primary Cardiologist: Dr. Mariah Milling  Subjective   No chest pain  Inpatient Medications    Scheduled Meds: . aspirin EC  81 mg Oral Daily  . gabapentin  300 mg Oral QHS  . insulin aspart  0-15 Units Subcutaneous TID WC  . insulin aspart  0-5 Units Subcutaneous QHS  . insulin aspart  5 Units Subcutaneous TID WC  . losartan  25 mg Oral QHS  . metoprolol tartrate  25 mg Oral BID  . multivitamin with minerals  1 tablet Oral Daily  . rosuvastatin  20 mg Oral Daily  . sodium chloride flush  3 mL Intravenous Q12H  . ticagrelor  90 mg Oral BID   Continuous Infusions: . sodium chloride     PRN Meds: sodium chloride, acetaminophen, morphine injection, ondansetron (ZOFRAN) IV, sodium chloride flush   Vital Signs    Vitals:   07/12/17 0400 07/12/17 0500 07/12/17 0600 07/12/17 0700  BP: 120/82  129/84   Pulse: 95 92 86 86  Resp: Temp: 98.2 F (36.8 C)   98 F (36.7 C)  TempSrc: Oral     SpO2: 99% 100% 97% 98%  Weight:      Height:        Intake/Output Summary (Last 24 hours) at 07/12/17 1140 Last data filed at 07/11/17 1800  Gross per 24 hour  Intake              483 ml  Output                0 ml  Net              483 ml   Filed Weights   07/10/17 1445  Weight: 221 lb 9 oz (100.5 kg)    Telemetry    NSR- Personally Reviewed  ECG    NSR, persistent lateral ST elevation - Personally Reviewed  Physical Exam   GEN: No acute distress.   Neck: No JVD Cardiac: RRR, no murmurs, rubs, or gallops.  Respiratory: Clear to auscultation bilaterally. GI: Soft, nontender, non-distended  MS: No edema; No deformity. Neuro:  Nonfocal  Psych: Normal affect   Labs    Chemistry Recent Labs Lab 07/10/17 1131 07/11/17 0216  NA 133* 134*  K 4.5 4.0  CL 100* 103  CO2 22 26  GLUCOSE 267* 280*  BUN 14 7  CREATININE 0.78 0.81  CALCIUM 9.1 8.5*  PROT 7.3  --   ALBUMIN 3.3*  --     AST 29  --   ALT 22  --   ALKPHOS 60  --   BILITOT 0.7  --   GFRNONAA >60 >60  GFRAA >60 >60  ANIONGAP 11 5     Hematology Recent Labs Lab 07/10/17 1131 07/11/17 0216  WBC 7.7 6.9  RBC 4.76 4.20*  HGB 14.2 12.1*  HCT 40.3 35.5*  MCV 84.5 84.5  MCH 29.7 28.8  MCHC 35.2 34.1  RDW 12.0 11.8  PLT 249 249    Cardiac Enzymes Recent Labs Lab 07/10/17 1131 07/10/17 1947 07/11/17 0216  TROPONINI 3.79* 4.04* 4.23*   No results for input(s): TROPIPOC in the last 168 hours.   BNPNo results for input(s): BNP, PROBNP in the last 168 hours.   DDimer No results for input(s): DDIMER in the last 168 hours.   Radiology    No results found.  Cardiac Studies  Cath results showing circ and SVG to OM disease  Patient Profile     36 y.o. male *s/p lateral wall MI  Assessment & Plan    1) Continue DAPT along with aggressive secondary prevention.  DM control paramount.  Restart lipid lowering thereapy.  Case manager for med assistance.  He will need to apply for assistance.  Plan discharge today.  For questions or updates, please contact [facility] Please consult [email or web address] for contact info under Cardiology/STEMI.      Signed, Lance Muss, MD  07/12/2017, 11:40 AM

## 2017-07-12 NOTE — Progress Notes (Signed)
CARDIAC REHAB PHASE I   PRE:  Rate/Rhythm: 88 SR    BP: sitting 103/70    SaO2:   MODE:  Ambulation: 740 ft   POST:  Rate/Rhythm: 104 ST    BP: sitting 115/84     SaO2:   Tolerated well, no c/o. Ed completed with pt, wife, and son. Voiced understanding and asked appropriate questions. Will refer to [place] [facility]II. Understands importance of Brilinta. Long discussion of controlling DM and taking his meds. Pt needs assistance or change in type of meds until January when he can apply for Part D.  Encouraged pt to make PCP appt to change type of insulin until January. [phone]  Harriet Masson CES, ACSM 07/12/2017 12:22 PM

## 2017-07-13 NOTE — Telephone Encounter (Signed)
Patient contacted regarding discharge from [facility] on 07/12/17.  Patient understands to follow up with provider Dr. Mariah Milling on 08/22/17 at 07:40 AM  at [facility]. Patient understands discharge instructions? Yes Patient understands medications and regiment? Yes Patient understands to bring all medications to this visit? Yes  Spoke with patient and he wants to make sure that he is on our wait list if something should come open sooner. He confirmed scheduled appointment and had no further questions at this time.

## 2017-07-13 NOTE — Telephone Encounter (Signed)
PT returning call

## 2017-07-18 ENCOUNTER — Inpatient Hospital Stay
Admission: EM | Admit: 2017-07-18 | Discharge: 2017-07-19 | DRG: 315 | Disposition: A | Discharge status: Home / Self Care | Payer: Medicare Other | Attending: Internal Medicine | Admitting: Internal Medicine

## 2017-07-18 ENCOUNTER — Emergency Department: Payer: Medicare Other

## 2017-07-18 DIAGNOSIS — Z951 Presence of aortocoronary bypass graft: Secondary | ICD-10-CM

## 2017-07-18 DIAGNOSIS — Z8673 Personal history of transient ischemic attack (TIA), and cerebral infarction without residual deficits: Secondary | ICD-10-CM | POA: Diagnosis not present

## 2017-07-18 DIAGNOSIS — Z955 Presence of coronary angioplasty implant and graft: Secondary | ICD-10-CM

## 2017-07-18 DIAGNOSIS — I241 Dressler's syndrome: Secondary | ICD-10-CM | POA: Diagnosis not present

## 2017-07-18 DIAGNOSIS — E1165 Type 2 diabetes mellitus with hyperglycemia: Secondary | ICD-10-CM | POA: Diagnosis not present

## 2017-07-18 DIAGNOSIS — E785 Hyperlipidemia, unspecified: Secondary | ICD-10-CM | POA: Diagnosis present

## 2017-07-18 DIAGNOSIS — I2510 Atherosclerotic heart disease of native coronary artery without angina pectoris: Secondary | ICD-10-CM | POA: Diagnosis present

## 2017-07-18 DIAGNOSIS — Z9119 Patient's noncompliance with other medical treatment and regimen: Secondary | ICD-10-CM | POA: Diagnosis not present

## 2017-07-18 DIAGNOSIS — Z87891 Personal history of nicotine dependence: Secondary | ICD-10-CM | POA: Diagnosis not present

## 2017-07-18 DIAGNOSIS — E669 Obesity, unspecified: Secondary | ICD-10-CM | POA: Diagnosis not present

## 2017-07-18 DIAGNOSIS — Z6831 Body mass index (BMI) 31.0-31.9, adult: Secondary | ICD-10-CM | POA: Diagnosis not present

## 2017-07-18 DIAGNOSIS — Z794 Long term (current) use of insulin: Secondary | ICD-10-CM

## 2017-07-18 DIAGNOSIS — Z8249 Family history of ischemic heart disease and other diseases of the circulatory system: Secondary | ICD-10-CM

## 2017-07-18 DIAGNOSIS — R079 Chest pain, unspecified: Secondary | ICD-10-CM

## 2017-07-18 DIAGNOSIS — I200 Unstable angina: Secondary | ICD-10-CM | POA: Diagnosis not present

## 2017-07-18 DIAGNOSIS — Z7982 Long term (current) use of aspirin: Secondary | ICD-10-CM | POA: Diagnosis not present

## 2017-07-18 DIAGNOSIS — I110 Hypertensive heart disease with heart failure: Secondary | ICD-10-CM | POA: Diagnosis not present

## 2017-07-18 DIAGNOSIS — I252 Old myocardial infarction: Secondary | ICD-10-CM

## 2017-07-18 DIAGNOSIS — Z79899 Other long term (current) drug therapy: Secondary | ICD-10-CM

## 2017-07-18 DIAGNOSIS — I214 Non-ST elevation (NSTEMI) myocardial infarction: Secondary | ICD-10-CM | POA: Diagnosis present

## 2017-07-18 DIAGNOSIS — I255 Ischemic cardiomyopathy: Secondary | ICD-10-CM | POA: Diagnosis not present

## 2017-07-18 DIAGNOSIS — I5022 Chronic systolic (congestive) heart failure: Secondary | ICD-10-CM | POA: Diagnosis not present

## 2017-07-18 DIAGNOSIS — R778 Other specified abnormalities of plasma proteins: Secondary | ICD-10-CM

## 2017-07-18 DIAGNOSIS — R7989 Other specified abnormal findings of blood chemistry: Secondary | ICD-10-CM

## 2017-07-18 HISTORY — DX: Unspecified systolic (congestive) heart failure: I50.20

## 2017-07-18 HISTORY — DX: Ischemic cardiomyopathy: I25.5

## 2017-07-18 LAB — CBC WITH DIFFERENTIAL/PLATELET
BASOS ABS: 0 10*3/uL (ref 0–0.1)
Basophils Relative: 1 %
Eosinophils Absolute: 0.1 10*3/uL (ref 0–0.7)
Eosinophils Relative: 1 %
HEMATOCRIT: 38.7 % — AB (ref 40.0–52.0)
HEMOGLOBIN: 13 g/dL (ref 13.0–18.0)
LYMPHS PCT: 40 %
Lymphs Abs: 2.5 10*3/uL (ref 1.0–3.6)
MCH: 28.8 pg (ref 26.0–34.0)
MCHC: 33.7 g/dL (ref 32.0–36.0)
MCV: 85.5 fL (ref 80.0–100.0)
Monocytes Absolute: 0.5 10*3/uL (ref 0.2–1.0)
Monocytes Relative: 8 %
NEUTROS ABS: 3.2 10*3/uL (ref 1.4–6.5)
Neutrophils Relative %: 50 %
Platelets: 379 10*3/uL (ref 150–440)
RBC: 4.53 MIL/uL (ref 4.40–5.90)
RDW: 12 % (ref 11.5–14.5)
WBC: 6.4 10*3/uL (ref 3.8–10.6)

## 2017-07-18 LAB — GLUCOSE, CAPILLARY
GLUCOSE-CAPILLARY: 81 mg/dL (ref 65–99)
GLUCOSE-CAPILLARY: 287 mg/dL — AB (ref 65–99)

## 2017-07-18 LAB — COMPREHENSIVE METABOLIC PANEL
ALBUMIN: 3.4 g/dL — AB (ref 3.5–5.0)
ALT: 27 U/L (ref 17–63)
ANION GAP: 7 (ref 5–15)
AST: 20 U/L (ref 15–41)
Alkaline Phosphatase: 55 U/L (ref 38–126)
BILIRUBIN TOTAL: 0.4 mg/dL (ref 0.3–1.2)
BUN: 15 mg/dL (ref 6–20)
CHLORIDE: 106 mmol/L (ref 101–111)
CO2: 26 mmol/L (ref 22–32)
Calcium: 9.1 mg/dL (ref 8.9–10.3)
Creatinine, Ser: 0.85 mg/dL (ref 0.61–1.24)
GFR calc Af Amer: >60 mL/min (ref >60)
Glucose, Bld: 223 mg/dL — ABNORMAL HIGH (ref 65–99)
POTASSIUM: 4.2 mmol/L (ref 3.5–5.1)
Sodium: 139 mmol/L (ref 135–145)
TOTAL PROTEIN: 7.3 g/dL (ref 6.5–8.1)

## 2017-07-18 LAB — PROTIME-INR
INR: 0.95
Prothrombin Time: 12.6 seconds (ref 11.4–15.2)

## 2017-07-18 LAB — APTT: aPTT: 26 seconds (ref 24–36)

## 2017-07-18 LAB — TROPONIN I
TROPONIN I: 0.88 ng/mL — AB (ref <0.03)
TROPONIN I: 0.79 ng/mL — AB (ref <0.03)
Troponin I: 0.82 ng/mL (ref <0.03)

## 2017-07-18 LAB — HEPARIN LEVEL (UNFRACTIONATED): Heparin Unfractionated: 0.32 IU/mL (ref 0.30–0.70)

## 2017-07-18 MED ORDER — ASPIRIN 81 MG PO CHEW
243.0000 mg | CHEWABLE_TABLET | Freq: Once | ORAL | Status: AC
  Administered 2017-07-18: 243 mg via ORAL
  Filled 2017-07-18: qty 3

## 2017-07-18 MED ORDER — HYDROCODONE-ACETAMINOPHEN 5-325 MG PO TABS
1.0000 | ORAL_TABLET | ORAL | Status: DC | PRN
  Administered 2017-07-18: 1 via ORAL
  Filled 2017-07-18: qty 1

## 2017-07-18 MED ORDER — METOPROLOL TARTRATE 25 MG PO TABS
25.0000 mg | ORAL_TABLET | Freq: Two times a day (BID) | ORAL | Status: DC
  Administered 2017-07-18 – 2017-07-19 (×2): 25 mg via ORAL
  Filled 2017-07-18 (×2): qty 1

## 2017-07-18 MED ORDER — MORPHINE SULFATE (PF) 2 MG/ML IV SOLN: 2.0000 mg | INTRAVENOUS | Status: DC | PRN

## 2017-07-18 MED ORDER — HEPARIN BOLUS VIA INFUSION
4000.0000 [IU] | Freq: Once | INTRAVENOUS | Status: AC
  Administered 2017-07-18: 4000 [IU] via INTRAVENOUS
  Filled 2017-07-18: qty 4000

## 2017-07-18 MED ORDER — ONDANSETRON HCL 4 MG/2ML IJ SOLN
4.0000 mg | Freq: Once | INTRAMUSCULAR | Status: AC
  Administered 2017-07-18: 4 mg via INTRAVENOUS
  Filled 2017-07-18: qty 2

## 2017-07-18 MED ORDER — INSULIN ASPART 100 UNIT/ML ~~LOC~~ SOLN
0.0000 [IU] | Freq: Every day | SUBCUTANEOUS | Status: DC
  Administered 2017-07-18: 3 [IU] via SUBCUTANEOUS
  Filled 2017-07-18: qty 1

## 2017-07-18 MED ORDER — ACETAMINOPHEN 325 MG PO TABS: 650.0000 mg | ORAL_TABLET | Freq: Four times a day (QID) | ORAL | Status: DC | PRN

## 2017-07-18 MED ORDER — COLCHICINE 0.6 MG PO TABS
0.6000 mg | ORAL_TABLET | Freq: Two times a day (BID) | ORAL | Status: DC
  Administered 2017-07-18 – 2017-07-19 (×3): 0.6 mg via ORAL
  Filled 2017-07-18 (×3): qty 1

## 2017-07-18 MED ORDER — TICAGRELOR 90 MG PO TABS
90.0000 mg | ORAL_TABLET | Freq: Two times a day (BID) | ORAL | Status: DC
  Administered 2017-07-18 – 2017-07-19 (×2): 90 mg via ORAL
  Filled 2017-07-18 (×2): qty 1

## 2017-07-18 MED ORDER — LOSARTAN POTASSIUM 25 MG PO TABS: 25.0000 mg | ORAL_TABLET | Freq: Every day | ORAL | Status: DC

## 2017-07-18 MED ORDER — ADULT MULTIVITAMIN W/MINERALS CH
1.0000 | ORAL_TABLET | Freq: Every day | ORAL | Status: DC
  Administered 2017-07-19: 1 via ORAL
  Filled 2017-07-18: qty 1

## 2017-07-18 MED ORDER — HEPARIN (PORCINE) IN NACL 100-0.45 UNIT/ML-% IJ SOLN
1400.0000 [IU]/h | INTRAMUSCULAR | Status: DC
  Administered 2017-07-18: 1200 [IU]/h via INTRAVENOUS
  Administered 2017-07-19: 1400 [IU]/h via INTRAVENOUS
  Filled 2017-07-18 (×2): qty 250

## 2017-07-18 MED ORDER — ONDANSETRON HCL 4 MG PO TABS: 4.0000 mg | ORAL_TABLET | Freq: Four times a day (QID) | ORAL | Status: DC | PRN

## 2017-07-18 MED ORDER — INSULIN ASPART 100 UNIT/ML ~~LOC~~ SOLN
0.0000 [IU] | Freq: Three times a day (TID) | SUBCUTANEOUS | Status: DC
  Administered 2017-07-19 (×2): 5 [IU] via SUBCUTANEOUS
  Administered 2017-07-19: 2 [IU] via SUBCUTANEOUS
  Filled 2017-07-18 (×3): qty 1

## 2017-07-18 MED ORDER — MORPHINE SULFATE (PF) 4 MG/ML IV SOLN
4.0000 mg | Freq: Once | INTRAVENOUS | Status: DC
  Filled 2017-07-18: qty 1

## 2017-07-18 MED ORDER — INSULIN DETEMIR 100 UNIT/ML ~~LOC~~ SOLN
10.0000 [IU] | Freq: Two times a day (BID) | SUBCUTANEOUS | Status: DC
  Administered 2017-07-18 – 2017-07-19 (×2): 10 [IU] via SUBCUTANEOUS
  Filled 2017-07-18 (×4): qty 0.1

## 2017-07-18 MED ORDER — ONDANSETRON HCL 4 MG/2ML IJ SOLN: 4.0000 mg | Freq: Four times a day (QID) | INTRAMUSCULAR | Status: DC | PRN

## 2017-07-18 MED ORDER — ACETAMINOPHEN 650 MG RE SUPP: 650.0000 mg | Freq: Four times a day (QID) | RECTAL | Status: DC | PRN

## 2017-07-18 MED ORDER — ASPIRIN EC 81 MG PO TBEC
81.0000 mg | DELAYED_RELEASE_TABLET | Freq: Every day | ORAL | Status: DC
  Administered 2017-07-19: 81 mg via ORAL
  Filled 2017-07-18: qty 1

## 2017-07-18 NOTE — H&P (Signed)
[facility] [place] [facility]   PATIENT NAME: [name]    MR#:  [id number]  DATE OF BIRTH:  05/05/1981  DATE OF ADMISSION:  07/18/2017  PRIMARY CARE PHYSICIAN: Marisue Ivan, MD   REQUESTING/REFERRING PHYSICIAN: Dr. Governor Rooks  CHIEF COMPLAINT:   Chief Complaint  Patient presents with  . Chest Pain    HISTORY OF PRESENT ILLNESS:  [name]  is a 36 y.o. 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  Patient had chest pain 2 weeks ago almost like a muscle ache that he waited out for a week before he went to the hospital. Then he was noted to have NSTEMI and had a cardiac catheter done and had balloon angioplasty to one vessel and a stent placement with 0% residual obstruction. He was discharged on aspirin, Brillinta. He has uncontrolled diabetes mellitus due to inability to afford for his insulin. He is currently taking questionable over the counter 70-30 insulin 3 times a day. His A1c last week was 12.6. He has been taking his medications like he was supposed to, however had a significant chest pressure yesterday Associated with the pain radiating down his left arm and also has some left neck pain. Complains of sweats and nausea associated with it. He called his PCP today who has advised him to come to the emergency room. His first troponin is elevated at 0.88.  PAST MEDICAL HISTORY:   Past Medical History:  Diagnosis Date  . Coronary atherosclerosis of artery bypass graft    a. 10/2006 s/p PCI/stenting of LCX;  b. 02/2007 s/p CABG x 4 @ age 53 (LIMA->LAD, RIMA->Diag, VG->OM, VG->RPDA;  c. 06/2017 Lateral STEMI/PCI: LM 50, LAD 100ost, 16m, RI small, LCX 95ost/p (PTCA - 2.88mm balloon), 70p/m ISR, OM2 75, RCA 100p/m,  VG->RPDA 25, VG->OM2 80ost (3.0x15 Sierra DES), RIMA->Diag 100, LIMA->LAD nl.  . HFrEF (heart failure with reduced ejection fraction) (HCC)    a. 07/2017 Echo: EF 35-40%  . HLD (hyperlipidemia)    poorly controlled  . HTN (hypertension)   . Hx-TIA (transient ischemic attack) 2009   possible-([facility]) Normal MRI of brain and MRA of the head and neck  . Ischemic cardiomyopathy    a. 07/2017 Echo: EF 35-40%, apicalanteroseptal and apical AK, GR2 DD, mildly to mod reduced RV fxn.  . Kidney stone   . Noncompliance   . Obesity, unspecified   . Uncontrolled diabetes mellitus with complications (HCC)    a. Dx 2002 - poorly controlled in setting of noncompliance;  b. 07/2017 HbA1c 12.6.    PAST SURGICAL HISTORY:   Past Surgical History:  Procedure Laterality Date  . CORONARY ARTERY BYPASS GRAFT  2007   x 4 (age 27)  . CORONARY BALLOON ANGIOPLASTY N/A 07/10/2017   Procedure: CORONARY BALLOON ANGIOPLASTY;  Surgeon: Corky Crafts, MD;  Location: [facility] INVASIVE CV LAB;  Service: Cardiovascular;  Laterality: N/A;  . CORONARY STENT INTERVENTION N/A 07/10/2017   Procedure: CORONARY STENT INTERVENTION;  Surgeon: Corky Crafts, MD;  Location: [facility] INVASIVE CV LAB;  Service: Cardiovascular;  Laterality: N/A;  . LEFT HEART CATH AND CORS/GRAFTS ANGIOGRAPHY N/A 07/10/2017   Procedure: LEFT HEART CATH AND CORS/GRAFTS ANGIOGRAPHY;  Surgeon: Corky Crafts, MD;  [facility] IN[facility] CV LAB;  Service: Cardiovascular;  Laterality: N/A;    SOCIAL HISTORY:   Social History  Substance Use Topics  . Smoking status: Former Smoker    Packs/day: 2.00    Years: 14.00    Types: Cigarettes    Quit date: 10/11/2004  . Smokeless tobacco: Former Neurosurgeon    Types: Chew, Snuff     Comment: used to smoke 1 1/2 ppd  . Alcohol use 1.2 oz/week    2 Cans of beer per week     Comment: 7-8 lite beers per month    FAMILY HISTORY:   Family History  Problem Relation Age of Onset  . Emphysema Father        +  smoker  . COPD Father        Died age 53  . Coronary artery disease Maternal Grandfather   . Heart attack Maternal Grandfather   . Diabetes Maternal Grandfather   . Hypertension Maternal Grandfather   . Hyperlipidemia Maternal Grandfather   . Heart disease Maternal Grandfather        CAD  . Lung cancer Unknown        paternal uncles and aunts (all smokers)  . Coronary artery disease Unknown        premature-family history    DRUG ALLERGIES:   Allergies  Allergen Reactions  . Nitroglycerin Nausea And Vomiting    REVIEW OF SYSTEMS:   Review of Systems  Constitutional: Negative for chills, fever, malaise/fatigue and weight loss.  HENT: Negative for ear discharge, ear pain, hearing loss, nosebleeds and tinnitus.   Eyes: Negative for blurred vision, double vision and photophobia.  Respiratory: Negative for cough, hemoptysis, shortness of breath and wheezing.   Cardiovascular: Positive for chest pain. Negative for palpitations, orthopnea and leg swelling.       Radiating to left arm  Gastrointestinal: Negative for abdominal pain, constipation, diarrhea, heartburn, melena, nausea and vomiting.  Genitourinary: Negative for dysuria, frequency, hematuria and urgency.  Musculoskeletal: Positive for neck pain. Negative for back pain and myalgias.  Skin: Negative for rash.  Neurological: Negative for dizziness, tingling, tremors, sensory change, speech change, focal weakness and headaches.  Endo/Heme/Allergies: Does not bruise/bleed easily.  Psychiatric/Behavioral: Negative for depression.    MEDICATIONS AT HOME:   Prior to Admission medications   Medication Sig Start Date End Date Taking? Authorizing Provider  aspirin EC 81 MG tablet Take 81 mg by mouth daily.   Yes [provider]  insulin aspart protamine- aspart (NOVOLOG MIX 70/30) (70-30) 100 UNIT/ML injection Inject 15 Units into the skin 3 (three) times daily.   Yes [provider]  losartan (COZAAR) 25 MG  tablet Take 1 tablet (25 mg total) by mouth at bedtime. 07/12/17 07/12/18 Yes Arty Baumgartner, NP  metoprolol tartrate (LOPRESSOR) 25 MG tablet Take 1 tablet (25 mg total) by mouth 2 (two) times daily. 07/12/17 07/12/18 Yes Arty Baumgartner, NP  Multiple Vitamin (MULTIVITAMIN) tablet Take 2 tablets by mouth daily.    Yes [provider]  ticagrelor (BRILINTA) 90 MG TABS tablet Take 1 tablet (90 mg total) by mouth 2 (two) times daily. 07/12/17  Yes Arty Baumgartner, NP      VITAL SIGNS:  Blood pressure 111/80, pulse 72, temperature 98 F (36.7 C), temperature source Oral, resp. rate 15, height  (1.778 m), weight 99.8 kg (220 lb), SpO2 97 %.  PHYSICAL EXAMINATION:   Physical Exam  GENERAL:  36 y.o.-year-old patient lying in the bed with no acute distress.  EYES: Pupils equal, round, reactive to light and accommodation. No scleral icterus. Extraocular muscles intact.  HEENT: Head atraumatic, normocephalic. Oropharynx and nasopharynx clear.  NECK:  Supple, no jugular venous distention. No thyroid enlargement, no tenderness.  LUNGS: Normal breath sounds bilaterally, no wheezing, rales,rhonchi or crepitation. No use of accessory muscles of respiration.  CARDIOVASCULAR: S1, S2 normal. No murmurs, rubs, or gallops. CABG scar present. ABDOMEN: Soft, nontender, nondistended. Bowel sounds present. No organomegaly or mass.  EXTREMITIES: No pedal edema, cyanosis, or clubbing.  NEUROLOGIC: Cranial nerves II through XII are intact. Muscle strength 5/5 in all extremities. Sensation intact except decreased sensation to light touch in both lower extremities up to the knees. Gait not checked.  PSYCHIATRIC: The patient is alert and oriented x 3.  SKIN: No obvious rash, lesion, or ulcer.   LABORATORY PANEL:   CBC  Recent Labs Lab 07/18/17 1143  WBC 6.4  HGB 13.0  HCT 38.7*  PLT 379    ------------------------------------------------------------------------------------------------------------------  Chemistries   Recent Labs Lab 07/18/17 1143  NA 139  K 4.2  CL 106  CO2 26  GLUCOSE 223*  BUN 15  CREATININE 0.85  CALCIUM 9.1  AST 20  ALT 27  ALKPHOS 55  BILITOT 0.4   ------------------------------------------------------------------------------------------------------------------  Cardiac Enzymes  Recent Labs Lab 07/18/17 1143  TROPONINI 0.88*   ------------------------------------------------------------------------------------------------------------------  RADIOLOGY:  Dg Chest Port 1 View  Result Date: 07/18/2017 CLINICAL DATA:  Chest pain.  Left arm pain. EXAM: PORTABLE CHEST 1 VIEW COMPARISON:  02/17/2017 FINDINGS: The heart size and mediastinal contours are within normal limits except for surgical changes of CABG. Both lungs are clear. The visualized skeletal structures are unremarkable. IMPRESSION: No acute abnormalities. Electronically Signed   By: Francene Boyers M.D.   On: 07/18/2017 12:20    EKG:   Orders placed or performed during the hospital encounter of 07/18/17  . EKG 12-Lead  . EKG 12-Lead  . ED EKG within 10 minutes  . ED EKG within 10 minutes    IMPRESSION AND PLAN:   [name]  is a 36 y.o. 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  #1 NSTEMI- recent cath with stent placement, prior CABG - h/o premature CAD, admit to tele - IV heparin started - cardiology consulted, recycle troponins - on asa, brillinta, statin, metoprolol  #2 Ischemic cardiomyopathy- EF 35-40%, well compensated - not on lasix at home, monitor - continue metoprolol and losartan  #3 DM- uncontrolled- care mgmt  consult to help with insulin - started on levemir- he was discharged on it last week from [facility] - also SSI  #4 HTN- metoprolol, losartan  #5 DVT Prophylaxis- heparin drip    All the records are reviewed and case discussed with ED provider. Management plans discussed with the patient, family and they are in agreement.  CODE STATUS: Full Code  TOTAL TIME TAKING CARE OF THIS PATIENT: 50 minutes.    [name] M.D on 07/18/2017 at 2:20 PM  Between 7am to 6pm - Pager - [phone]  After 6pm go to [email or web address] - Social research officer, government  [facility]  Office  [phone]  CC: Primary care physician; Marisue Ivan, MD

## 2017-07-18 NOTE — Consult Note (Signed)
Cardiology Consult    Patient ID: [name] MRN: [id number], DOB/AGE: [birth year]   Admit date: 07/18/2017 Date of Consult: 07/18/2017  Primary Physician: Marisue Ivan, MD Primary Cardiologist: Concha Se, MD  Requesting Provider: Henreitta Leber, MD  Patient Profile    [name] is a 36 y.o. male with a history of CAD s/p prior CABG and recent lateral STEMI, ICM, HFrEF, HL, HTN, TIA, poorly controlled IDDM, and noncompliance, who is being seen today for the evaluation of chest pain at the request of Dr. Nemiah Commander.  Past Medical History   Past Medical History:  Diagnosis Date  . Coronary atherosclerosis of artery bypass graft    a. 10/2006 s/p PCI/stenting of LCX;  b. 02/2007 s/p CABG x 4 @ age 33 (LIMA->LAD, RIMA->Diag, VG->OM, VG->RPDA;  c. 06/2017 Lateral STEMI/PCI: LM 50, LAD 100ost, 10m, RI small, LCX 95ost/p (PTCA - 2.73mm balloon), 70p/m ISR, OM2 75, RCA 100p/m, VG->RPDA 25, VG->OM2 80ost (3.0x15 Sierra DES), RIMA->Diag 100, LIMA->LAD nl.  . HFrEF (heart failure with reduced ejection fraction) (HCC)    a. 07/2017 Echo: EF 35-40%  . HLD (hyperlipidemia)    poorly controlled  . HTN (hypertension)   . Hx-TIA (transient ischemic attack) 2009   possible-([facility]) Normal MRI of brain and MRA of the head and neck  . Ischemic cardiomyopathy    a. 07/2017 Echo: EF 35-40%, apicalanteroseptal and apical AK, GR2 DD, mildly to mod reduced RV fxn.  . Kidney stone   . Noncompliance   . Obesity, unspecified   . Uncontrolled diabetes mellitus with complications (HCC)    a. Dx 2002 - poorly controlled in setting of noncompliance;  b. 07/2017 HbA1c 12.6.    Past Surgical History:  Procedure Laterality Date  . CORONARY ARTERY BYPASS GRAFT  2007   x 4 (age 1)  . CORONARY BALLOON ANGIOPLASTY N/A 07/10/2017   Procedure: CORONARY BALLOON ANGIOPLASTY;  Surgeon: Corky Crafts, MD;  Location: [facility] INVASIVE CV LAB;  Service: Cardiovascular;  Laterality: N/A;  . CORONARY STENT  INTERVENTION N/A 07/10/2017   Procedure: CORONARY STENT INTERVENTION;  Surgeon: Corky Crafts, MD;  Location: [facility] INVASIVE CV LAB;  Service: Cardiovascular;  Laterality: N/A;  . LEFT HEART CATH AND CORS/GRAFTS ANGIOGRAPHY N/A 07/10/2017   Procedure: LEFT HEART CATH AND CORS/GRAFTS ANGIOGRAPHY;  Surgeon: Corky Crafts, MD;  Location: [facility] INVASIVE CV LAB;  Service: Cardiovascular;  Laterality: N/A;     Allergies  Allergies  Allergen Reactions  . Nitroglycerin Nausea And Vomiting    History of Present Illness    36 y/o ? with the above complex PMH including CAD s/p CABG x 4 @ the age of 22, HTN, HL, IDDM (poorly controlled), TIA, and noncompliance.  He was recently admitted to [facility] in the setting of chest pain and lateral ST elevation.  Cath revealed severe proximal, native LCX dzs (small vessel  PTCA only) and severe ostial VG  OM dzs.  The VG  OM was treated with a DES.  Echo during admission showed reduced LV fxn @ 35-40%.  A1c was markedly elevated @ 12.6.  He was placed on asa, brilinta,  blocker, ARB, statin, and insulin therapy and subsequently d/c'd on 10/2.  Pt says that he has been compliant with meds @ home.  He has been using insulin and glucoses have been running 170's to 220's.  He was in his usual state of health until 10/7, when he noted poor appetite and some malaise.  Glucoses were over 300.  Shortly after dinner, around 6pm, he began to note left sided chest discomfort and pressure radiating to his left arm, similar in character to previous symptoms but not as severe.  Chest pain persisted throughout the evening and night and was worse with lying flat (becoming associated with neck pain/pulling), deep breathing, and palpation.  He saw his PCP this AM and was referred to the ED.  Here, ECG is similar to last admission, though ST changes in lateral leads are less pronounced.  Initial trop 0.88, down from 4.23 07/11/2017.  He continues to have mild chest discomfort and  tenderness.  Home Medications    Prior to Admission medications   Medication Sig Start Date End Date Taking? Authorizing Provider  aspirin EC 81 MG tablet Take 81 mg by mouth daily.   Yes [provider]  insulin aspart protamine- aspart (NOVOLOG MIX 70/30) (70-30) 100 UNIT/ML injection Inject 15 Units into the skin 3 (three) times daily.   Yes [provider]  losartan (COZAAR) 25 MG tablet Take 1 tablet (25 mg total) by mouth at bedtime. 07/12/17 07/12/18 Yes Arty Baumgartner, NP  metoprolol tartrate (LOPRESSOR) 25 MG tablet Take 1 tablet (25 mg total) by mouth 2 (two) times daily. 07/12/17 07/12/18 Yes Arty Baumgartner, NP  Multiple Vitamin (MULTIVITAMIN) tablet Take 2 tablets by mouth daily.    Yes [provider]  ticagrelor (BRILINTA) 90 MG TABS tablet Take 1 tablet (90 mg total) by mouth 2 (two) times daily. 07/12/17  Yes Arty Baumgartner, NP     Family History    Family History  Problem Relation Age of Onset  . Emphysema Father        + smoker  . COPD Father        Died age 38  . Coronary artery disease Maternal Grandfather   . Heart attack Maternal Grandfather   . Diabetes Maternal Grandfather   . Hypertension Maternal Grandfather   . Hyperlipidemia Maternal Grandfather   . Heart disease Maternal Grandfather        CAD  . Lung cancer Unknown        paternal uncles and aunts (all smokers)  . Coronary artery disease Unknown        premature-family history    Social History    Social History   Social History  . Marital status: Married    Spouse name: N/A  . Number of children: 1  . Years of education: N/A   Occupational History  . disability for heart condition    Social History Main Topics  . Smoking status: Former Smoker    Packs/day: 2.00    Years: 14.00    Types: Cigarettes    Quit date: 10/11/2004  . Smokeless tobacco: Former Neurosurgeon    Types: Chew, Snuff     Comment: used to smoke 1 1/2 ppd  . Alcohol use 1.2 oz/week    2  Cans of beer per week     Comment: 7-8 lite beers per month  . Drug use: No  . Sexual activity: Yes    Partners: Female   Other Topics Concern  . Not on file   Social History Narrative      Caffeine: 3 cups coffee ~ 4 times per week, 2L diet soda per day      Lives with wife and son (47 y/o)      Lives in [place]      On disability      Exercises -  walk, push ups, sit-ups. Coaches baseball. Hunting.           Review of Systems    General:  No chills, fever, night sweats or weight changes.  Cardiovascular: +++ chest pain, +++ dyspnea on exertion, no edema, orthopnea, palpitations, paroxysmal nocturnal dyspnea. Dermatological: No rash, lesions/masses Respiratory: No cough, +++ dyspnea Urologic: No hematuria, dysuria Abdominal:   +++ nausea, no vomiting, diarrhea, bright red blood per rectum, melena, or hematemesis Neurologic:  No visual changes, wkns, changes in mental status. All other systems reviewed and are otherwise negative except as noted above.  Physical Exam    Blood pressure 122/79, pulse 70, temperature (!) 97.5 F (36.4 C), temperature source Oral, resp. rate 15, height  (1.778 m), weight 220 lb (99.8 kg), SpO2 100 %.  General: Pleasant, NAD Psych: Normal affect. Neuro: Alert and oriented X 3. Moves all extremities spontaneously. HEENT: Normal  Neck: Supple without bruits or JVD. Lungs:  Resp regular and unlabored, CTA. Heart: RRR no s3, s4, or murmurs. Abdomen: Soft, non-tender, non-distended, BS + x 4.  Extremities: No clubbing, cyanosis or edema. DP/PT/Radials 2+ and equal bilaterally.  Labs     Recent Labs  07/18/17 1143  TROPONINI 0.88*   Lab Results  Component Value Date   WBC 6.4 07/18/2017   HGB 13.0 07/18/2017   HCT 38.7 (L) 07/18/2017   MCV 85.5 07/18/2017   PLT 379 07/18/2017     Recent Labs Lab 07/18/17 1143  NA 139  K 4.2  CL 106  CO2 26  BUN 15  CREATININE 0.85  CALCIUM 9.1  PROT 7.3  BILITOT 0.4  ALKPHOS  55  ALT 27  AST 20  GLUCOSE 223*   Lab Results  Component Value Date   CHOL 217 (H) 07/11/2017   HDL 24 (L) 07/11/2017   LDLCALC 159 (H) 07/11/2017   TRIG 170 (H) 07/11/2017     Radiology Studies    Dg Chest Port 1 View  Result Date: 07/18/2017 CLINICAL DATA:  Chest pain.  Left arm pain. EXAM: PORTABLE CHEST 1 VIEW COMPARISON:  02/17/2017 FINDINGS: The heart size and mediastinal contours are within normal limits except for surgical changes of CABG. Both lungs are clear. The visualized skeletal structures are unremarkable. IMPRESSION: No acute abnormalities. Electronically Signed   By: Francene Boyers M.D.   On: 07/18/2017 12:20    ECG & Cardiac Imaging    RSR, 78, antsept infarct w/ subtle (less pronounced) ST elevation in I and aVL with lateral TWI.  Assessment & Plan    1. Chest pain/coronary artery disease-lateral STEMI, subsequent episode of care: Patient with prior history of CAD status post CABG in 2008 with recent lateral STEMI requiring PTCA of the native left circumflex and drug-eluting stent placement to the ostial vein graft  OM. Hospital course was uncomplicated and he was doing well following discharge. He reports compliance with medications. Unfortunate, he developed retrosternal chest discomfort last night after dinner with radiation down the left arm and up into the left neck. Symptoms are worse with lying flat, deep breathing, and palpation of the chest wall. ECG is notable for subtle lateral ST elevation, which is less pronounced than on prior hospitalization. Thus far, troponin is lower than last admission, at 0.88. Admit and continue cycle enzymes. Chest pain is atypical and reproducible. Question post infarct pericarditis in addition to a musculoskeletal component. Provided that enzymes continued to trend down, would not likely pursue additional ischemic evaluation. If enzymes trend up, he  will require diagnostic catheterization tomorrow. Continue aspirin, brilinta, beta  blocker, ARB, and statin therapy. I will add colchicine and order echo to rule out effusion. Cannot use high-dose aspirin the setting of brilinta therapy.  2. Essential hypertension: Blood pressure stable continue beta blocker ARB.  3. Hyperlipidemia: Continue statin therapy. LDL was 1:15 on October 1. This will need follow-up in approximately 6 weeks.  4. Insulin-dependent diabetes mellitus: Hemoglobin A1c 12.6 recently. He is now on insulin and has been compliant. He says sugars at home are running 170s to 220s. Likely will need adjustment of 70/30. Per internal medicine.  5. Ischemic cardiopathy/HFrEF: EF 35-40% by echo earlier this month. Euvolemic on exam. Continue beta blocker and ARB.  Signed, Nicolasa Ducking, NP 07/18/2017, 3:19 PM  For questions or updates, please contact   Please consult [email or web address] for contact info under Cardiology/STEMI.

## 2017-07-18 NOTE — Plan of Care (Signed)
Problem: Education: Goal: Knowledge of [facility] Education information/materials will improve Outcome: Progressing Instructions regarding room features, unit routines, food service and plan of care.

## 2017-07-18 NOTE — ED Triage Notes (Signed)
Pt arrived via POV - c/o chest pain and had stent placed last week - pt has a history of MI - chest pain radiates into left arm and left jaw/neck - pt reports nausea

## 2017-07-18 NOTE — Progress Notes (Addendum)
ANTICOAGULATION CONSULT NOTE - Initial Consult  Pharmacy Consult for Heparin Drip Indication: chest pain/ACS  Allergies  Allergen Reactions  . Nitroglycerin Nausea And Vomiting    Patient Measurements: Height:  (177.8 cm) Weight: 220 lb (99.8 kg) IBW/kg (Calculated) : 73 Heparin Dosing Weight: 93.8 kg  Vital Signs: Temp: 98 F (36.7 C) (10/08 1135) Temp Source: Oral (10/08 1135) BP: 117/76 (10/08 1135) Pulse Rate: 76 (10/08 1135)  Labs:  Recent Labs  07/18/17 1143  HGB 13.0  HCT 38.7*  PLT 379    Estimated Creatinine Clearance: 147.8 mL/min (by C-G formula based on SCr of 0.81 mg/dL).   Medical History: Past Medical History:  Diagnosis Date  . Coronary atherosclerosis of artery bypass graft    Age 29 - [place] 9/18 PCI/DES to SVG-OM, PCI to pLCX, EF 35%  . HLD (hyperlipidemia)    poorly controlled  . HTN (hypertension)   . Hx-TIA (transient ischemic attack) 2009   possible-([facility]) Normal MRI of brain and MRA of the head and neck  . Kidney stone   . MI, old   . Noncompliance   . Obesity, unspecified   . Uncontrolled diabetes mellitus with complications (HCC) 2002   poorly controlled    Medications:  Scheduled:  . heparin  4,000 Units Intravenous Once  .  morphine injection  4 mg Intravenous Once   Infusions:  . heparin      Assessment: 36 yo M to start Heparin Drip for CP, ACS/STEMI. Patient had stent placed last week and hx of MI. Patient on Brilinta/ASA at home. Hgb 13.0  Plt 379  INR pending, aPTT pending  Goal of Therapy:  Heparin level 0.3-0.7 units/ml Monitor platelets by anticoagulation protocol: Yes   Plan:  Give 4000 units bolus x 1 Start heparin infusion at 1200 units/hr Check anti-Xa level in 6 hours and daily while on heparin Continue to monitor H&H and platelets  Azaryah Heathcock A 07/18/2017,12:17 PM

## 2017-07-18 NOTE — ED Notes (Signed)
Pharmacy called to send the heparin

## 2017-07-18 NOTE — ED Provider Notes (Signed)
[facility] Emergency Department Provider Note ____________________________________________   I have reviewed the triage vital signs and the triage nursing note.  HISTORY  Chief Complaint Chest Pain   Historian Patient  HPI [name] is a 36 y.o. male With a very significant cardiac history with first heart attack in his mid 68s, CABG in his mid 16s, and most recently angioplasty with stent placement 22 occluded graft veins per chart review last week, 9/30-10/1 diagnosis:.  Patient states that he is currently on aspirin and presented and took his medications this morning. He stated that yesterday evening he started for some mild central chest pressure which persisted over the course of the night into the morning. He was unable to get in with his primary cardiologist Dr. Thedora Hinders in and so he went to his primary care doctor's office this morning and was told to come directly to the emergency department for further evaluation. Next No associated symptoms in terms of nausea, sweats, dizziness, passing out, palpitations. He does have ongoing chest pressure. He has an allergy to nitroglycerin.  Further chart review notes history of noncompliance with medication due to difficulty paying for prescriptions.  Pain currently moderate to severe around 9 out of 10.    Past Medical History:  Diagnosis Date  . Coronary atherosclerosis of artery bypass graft    a. 10/2006 s/p PCI/stenting of LCX;  b. 02/2007 s/p CABG x 4 @ age 74 (LIMA->LAD, RIMA->Diag, VG->OM, VG->RPDA;  c. 06/2017 Lateral STEMI/PCI: LM 50, LAD 100ost, 69m, RI small, LCX 95ost/p (PTCA - 2.34mm balloon), 70p/m ISR, OM2 75, RCA 100p/m, VG->RPDA 25, VG->OM2 80ost (3.0x15 Sierra DES), RIMA->Diag 100, LIMA->LAD nl.  . HFrEF (heart failure with reduced ejection fraction) (HCC)    a. 07/2017 Echo: EF 35-40%  . HLD (hyperlipidemia)    poorly controlled  . HTN (hypertension)   . Hx-TIA (transient ischemic attack) 2009    possible-([facility]) Normal MRI of brain and MRA of the head and neck  . Ischemic cardiomyopathy    a. 07/2017 Echo: EF 35-40%, apicalanteroseptal and apical AK, GR2 DD, mildly to mod reduced RV fxn.  . Kidney stone   . Noncompliance   . Obesity, unspecified   . Uncontrolled diabetes mellitus with complications (HCC)    a. Dx 2002 - poorly controlled in setting of noncompliance;  b. 07/2017 HbA1c 12.6.    Patient Active Problem List   Diagnosis Date Noted  . NSTEMI (non-ST elevated myocardial infarction) (HCC) 07/18/2017  . Acute systolic heart failure (HCC) 07/12/2017  . Acute MI, lateral wall (HCC)   . Chest pain 09/28/2015  . Non compliance with medical treatment 03/24/2015  . Left flank pain 03/24/2015  . Cervical radiculitis 09/04/2014  . Acute prostatitis 07/20/2014  . Diabetes mellitus type 2 with complications, uncontrolled (HCC) 02/13/2014  . Macular edema 02/13/2014  . Mixed hyperlipidemia 11/08/2013  . Left shoulder pain 11/08/2013  . Financial difficulties 11/08/2013  . Depression with anxiety 11/08/2013  . Adjustment disorder with anxiety 10/26/2012  . Headache(784.0) 05/04/2011  . OBESITY 12/22/2010  . Atypical chest pain 03/05/2009  . Hyperlipidemia 03/03/2009  . Essential hypertension 03/03/2009  . CAD, ARTERY BYPASS GRAFT 03/03/2009    Past Surgical History:  Procedure Laterality Date  . CORONARY ARTERY BYPASS GRAFT  2007   x 4 (age 36)  . CORONARY BALLOON ANGIOPLASTY N/A 07/10/2017   Procedure: CORONARY BALLOON ANGIOPLASTY;  Surgeon: Corky Crafts, MD;  Location: [facility] INVASIVE CV LAB;  Service: Cardiovascular;  Laterality:  N/A;  . CORONARY STENT INTERVENTION N/A 07/10/2017   Procedure: CORONARY STENT INTERVENTION;  Surgeon: Corky Crafts, MD;  Location: [facility] INVASIVE CV LAB;  Service: Cardiovascular;  Laterality: N/A;  . LEFT HEART CATH AND CORS/GRAFTS ANGIOGRAPHY N/A 07/10/2017   Procedure: LEFT HEART CATH AND CORS/GRAFTS ANGIOGRAPHY;  Surgeon:  Corky Crafts, MD;  Location: [facility] INVASIVE CV LAB;  Service: Cardiovascular;  Laterality: N/A;    Prior to Admission medications   Medication Sig Start Date End Date Taking? Authorizing Provider  aspirin EC 81 MG tablet Take 81 mg by mouth daily.   Yes [provider]  insulin aspart protamine- aspart (NOVOLOG MIX 70/30) (70-30) 100 UNIT/ML injection Inject 15 Units into the skin 3 (three) times daily.   Yes [provider]  losartan (COZAAR) 25 MG tablet Take 1 tablet (25 mg total) by mouth at bedtime. 07/12/17 07/12/18 Yes Arty Baumgartner, NP  metoprolol tartrate (LOPRESSOR) 25 MG tablet Take 1 tablet (25 mg total) by mouth 2 (two) times daily. 07/12/17 07/12/18 Yes Arty Baumgartner, NP  Multiple Vitamin (MULTIVITAMIN) tablet Take 2 tablets by mouth daily.    Yes [provider]  ticagrelor (BRILINTA) 90 MG TABS tablet Take 1 tablet (90 mg total) by mouth 2 (two) times daily. 07/12/17  Yes Arty Baumgartner, NP    Allergies  Allergen Reactions  . Nitroglycerin Nausea And Vomiting    Family History  Problem Relation Age of Onset  . Emphysema Father        + smoker  . COPD Father        Died age 37  . Coronary artery disease Maternal Grandfather   . Heart attack Maternal Grandfather   . Diabetes Maternal Grandfather   . Hypertension Maternal Grandfather   . Hyperlipidemia Maternal Grandfather   . Heart disease Maternal Grandfather        CAD  . Lung cancer Unknown        paternal uncles and aunts (all smokers)  . Coronary artery disease Unknown        premature-family history    Social History Social History  Substance Use Topics  . Smoking status: Former Smoker    Packs/day: 2.00    Years: 14.00    Types: Cigarettes    Quit date: 10/11/2004  . Smokeless tobacco: Former Neurosurgeon    Types: Chew, Snuff     Comment: used to smoke 1 1/2 ppd  . Alcohol use 1.2 oz/week    2 Cans of beer per week     Comment: 7-8 lite beers per month    Review  of Systems  Constitutional: Negative for fever. Eyes: Negative for visual changes. ENT: Negative for sore throat. Cardiovascular:  Positive for chest pain. Respiratory: Negative for shortness of breath. Gastrointestinal: Negative for abdominal pain, vomiting and diarrhea. Genitourinary: Negative for dysuria. Musculoskeletal: Negative for back pain. Skin: Negative for rash. Neurological: Negative for headache.  ____________________________________________   PHYSICAL EXAM:  VITAL SIGNS: ED Triage Vitals  Enc Vitals Group     BP 07/18/17 1135 117/76     Pulse Rate 07/18/17 1135 76     Resp 07/18/17 1135 17     Temp 07/18/17 1135 98 F (36.7 C)     Temp Source 07/18/17 1135 Oral     SpO2 07/18/17 1135 100 %     Weight 07/18/17 1134 220 lb (99.8 kg)     Height 07/18/17 1134  (1.778 m)     Head  Circumference --      Peak Flow --      Pain Score 07/18/17 1133 10     Pain Loc --      Pain Edu? --      Excl. in GC? --      Constitutional: Alert and oriented. Well appearing and in no distress. HEENT   Head: Normocephalic and atraumatic.      Eyes: Conjunctivae are normal. Pupils equal and round.       Ears:         Nose: No congestion/rhinnorhea.   Mouth/Throat: Mucous membranes are moist.   Neck: No stridor. Cardiovascular/Chest: Normal rate, regular rhythm.  No murmurs, rubs, or gallops. Respiratory: Normal respiratory effort without tachypnea nor retractions. Breath sounds are clear and equal bilaterally. No wheezes/rales/rhonchi. Gastrointestinal: Soft. No distention, no guarding, no rebound. Nontender.    Genitourinary/rectal:Deferred Musculoskeletal: Nontender with normal range of motion in all extremities. No joint effusions.  No lower extremity tenderness.  No edema. Neurologic:  Normal speech and language. No gross or focal neurologic deficits are appreciated. Skin:  Skin is warm, dry and intact. No rash noted. Psychiatric: Mood and affect are  normal. Speech and behavior are normal. Patient exhibits appropriate insight and judgment.   ____________________________________________  LABS (pertinent positives/negatives) I, Governor Rooks, MD the attending physician have reviewed the labs noted below.  Labs Reviewed  TROPONIN I - Abnormal; Notable for the following:       Result Value   Troponin I 0.88 (*)    All other components within normal limits  COMPREHENSIVE METABOLIC PANEL - Abnormal; Notable for the following:    Glucose, Bld 223 (*)    Albumin 3.4 (*)    All other components within normal limits  CBC WITH DIFFERENTIAL/PLATELET - Abnormal; Notable for the following:    HCT 38.7 (*)    All other components within normal limits  TROPONIN I - Abnormal; Notable for the following:    Troponin I 0.82 (*)    All other components within normal limits  APTT  PROTIME-INR  HEPARIN LEVEL (UNFRACTIONATED)  GLUCOSE, CAPILLARY  HIV ANTIBODY (ROUTINE TESTING)  TROPONIN I  TROPONIN I  BASIC METABOLIC PANEL  CBC  HEPARIN LEVEL (UNFRACTIONATED)    ____________________________________________    EKG I, Governor Rooks, MD, the attending physician have personally viewed and interpreted all ECGs.  EKG #1. 78 bpm. Some artifact in the underlying baseline, but appears to be normal sinus rhythm. Narrow QRS. Normal axis. Patient has very minimal ST segment elevation in 1 and aVL which does not meet STEMI criteria. He has minimal ST segment depression in 3 and aVF.  In comparison with prior EKG from 9/30 and 10/1 patient had more significant ST elevation in the lateral leads then there are today. ____________________________________________  RADIOLOGY All Xrays were viewed by me.  Imaging interpreted by Radiologist, and I, Governor Rooks, MD the attending physician have reviewed the radiologist interpretation noted below.  chest x-ray report:  IMPRESSION: No acute  abnormalities. __________________________________________  PROCEDURES  Procedure(s) performed: None  Critical Care performed: CRITICAL CARE Performed by: Governor Rooks   Total critical care time: 30 minutes  Critical care time was exclusive of separately billable procedures and treating other patients.  Critical care was necessary to treat or prevent imminent or life-threatening deterioration.  Critical care was time spent personally by me on the following activities: development of treatment plan with patient and/or surrogate as well as nursing, discussions with consultants, evaluation of patient's  response to treatment, examination of patient, obtaining history from patient or surrogate, ordering and performing treatments and interventions, ordering and review of laboratory studies, ordering and review of radiographic studies, pulse oximetry and re-evaluation of patient's condition.   ____________________________________________  No current facility-administered medications on file prior to encounter.    Current Outpatient Prescriptions on File Prior to Encounter  Medication Sig Dispense Refill  . aspirin EC 81 MG tablet Take 81 mg by mouth daily.    Marland Kitchen losartan (COZAAR) 25 MG tablet Take 1 tablet (25 mg total) by mouth at bedtime. 30 tablet 0  . metoprolol tartrate (LOPRESSOR) 25 MG tablet Take 1 tablet (25 mg total) by mouth 2 (two) times daily. 60 tablet 0  . Multiple Vitamin (MULTIVITAMIN) tablet Take 2 tablets by mouth daily.     . ticagrelor (BRILINTA) 90 MG TABS tablet Take 1 tablet (90 mg total) by mouth 2 (two) times daily. 60 tablet 2    ____________________________________________  ED COURSE / ASSESSMENT AND PLAN  Pertinent labs & imaging results that were available during my care of the patient were reviewed by me and considered in my medical decision making (see chart for details).   Mr. Tilly has an incredibly significant cardiac history for someone so young,  including a CABG with 2 graft occlusion that had angioplasty last week including no new drug-eluting stent.  EKG today is nondiagnostic for STEMI, but does have some concerning findings. When compared with the prior EKGs, the findings are improved, and is unclear whether this is new ischemic findings or resolution from prior recent MI.  Laboratory studies show troponin 0.88. Previously was up to 4. Again it's unclear whether or not this is resolving from MI one week ago or new troponin leak. Patient is on heparin bolus and drip. He had his aspirin. Patient will be admitted to the hospital service.   DIFFERENTIAL DIAGNOSIS: Differential diagnosis includes, but is not limited to, ACS, aortic dissection, pulmonary embolism, cardiac tamponade, pneumothorax, pneumonia, pericarditis/myocarditis, GI-related causes including esophagitis/gastritis, and musculoskeletal chest wall pain.    CONSULTATIONS:   Dr. Kirke Corin, cardiology on-call for [facility] medical group. Reviewed the EKG from today as well as the 2 previous ones from last week and agrees today. EKG and presentation is not consistent with activating STEMI alert, but we will initiate heparin bolus and drip and initiate medical workup.    Patient / Family / Caregiver informed of clinical course, medical decision-making process, and agree with plan.   ___________________________________________   FINAL CLINICAL IMPRESSION(S) / ED DIAGNOSES   Final diagnoses:  Chest pain, unspecified type  Troponin level elevated              Note: This dictation was prepared with Dragon dictation. Any transcriptional errors that result from this process are unintentional    Governor Rooks, MD 07/18/17 2023

## 2017-07-18 NOTE — Progress Notes (Signed)
Patient admitted from ED with Chest pain radiating to neck and face.  He had a stent placed one week ago.  Heparin dripp started in ED.

## 2017-07-18 NOTE — Progress Notes (Signed)
ANTICOAGULATION CONSULT NOTE - Initial Consult  Pharmacy Consult for Heparin Drip Indication: chest pain/ACS  Allergies  Allergen Reactions  . Nitroglycerin Nausea And Vomiting    Patient Measurements: Height:  (177.8 cm) Weight: 220 lb (99.8 kg) IBW/kg (Calculated) : 73 Heparin Dosing Weight: 93.8 kg  Vital Signs: Temp: 97.5 F (36.4 C) (10/08 1919) Temp Source: Oral (10/08 1919) BP: 118/68 (10/08 1919) Pulse Rate: 91 (10/08 1919)  Labs:  Recent Labs  07/18/17 1143 07/18/17 1144 07/18/17 1458 07/18/17 1812  HGB 13.0  --   --   --   HCT 38.7*  --   --   --   PLT 379  --   --   --   APTT  --  26  --   --   LABPROT  --  12.6  --   --   INR  --  0.95  --   --   HEPARINUNFRC  --   --   --  0.32  CREATININE 0.85  --   --   --   TROPONINI 0.88*  --  0.82*  --     Estimated Creatinine Clearance: 140.9 mL/min (by C-G formula based on SCr of 0.85 mg/dL).   Medical History: Past Medical History:  Diagnosis Date  . Coronary atherosclerosis of artery bypass graft    a. 10/2006 s/p PCI/stenting of LCX;  b. 02/2007 s/p CABG x 4 @ age 28 (LIMA->LAD, RIMA->Diag, VG->OM, VG->RPDA;  c. 06/2017 Lateral STEMI/PCI: LM 50, LAD 100ost, 60m, RI small, LCX 95ost/p (PTCA - 2.11mm balloon), 70p/m ISR, OM2 75, RCA 100p/m, VG->RPDA 25, VG->OM2 80ost (3.0x15 Sierra DES), RIMA->Diag 100, LIMA->LAD nl.  . HFrEF (heart failure with reduced ejection fraction) (HCC)    a. 07/2017 Echo: EF 35-40%  . HLD (hyperlipidemia)    poorly controlled  . HTN (hypertension)   . Hx-TIA (transient ischemic attack) 2009   possible-([facility]) Normal MRI of brain and MRA of the head and neck  . Ischemic cardiomyopathy    a. 07/2017 Echo: EF 35-40%, apicalanteroseptal and apical AK, GR2 DD, mildly to mod reduced RV fxn.  . Kidney stone   . Noncompliance   . Obesity, unspecified   . Uncontrolled diabetes mellitus with complications (HCC)    a. Dx 2002 - poorly controlled in setting of noncompliance;  b.  07/2017 HbA1c 12.6.    Medications:  Scheduled:  . [START ON 07/19/2017] aspirin EC  81 mg Oral Daily  . colchicine  0.6 mg Oral BID  . insulin aspart  0-5 Units Subcutaneous QHS  . insulin aspart  0-9 Units Subcutaneous TID WC  . insulin detemir  10 Units Subcutaneous BID  . [START ON 07/19/2017] losartan  25 mg Oral QHS  . metoprolol tartrate  25 mg Oral BID  .  morphine injection  4 mg Intravenous Once  . [START ON 07/19/2017] multivitamin with minerals  1 tablet Oral Daily  . ticagrelor  90 mg Oral BID   Infusions:  . heparin 1,200 Units/hr (07/18/17 1244)    Assessment: 36 yo M to start Heparin Drip for CP, ACS/STEMI. Patient had stent placed last week and hx of MI. Patient on Brilinta/ASA at home. Hgb 13.0  Plt 379  INR pending, aPTT pending  Goal of Therapy:  Heparin level 0.3-0.7 units/ml Monitor platelets by anticoagulation protocol: Yes   Plan:  Give 4000 units bolus x 1 Start heparin infusion at 1200 units/hr Check anti-Xa level in 6 hours and daily while on  heparin Continue to monitor H&H and platelets   10/8:  HL @ 18:00 = 0.32 Will continue this pt on current rate and recheck HL on 10/09 @ 00:00.   Moris Ratchford D 07/18/2017,8:05 PM

## 2017-07-19 ENCOUNTER — Inpatient Hospital Stay (HOSPITAL_COMMUNITY)
Admit: 2017-07-19 | Discharge: 2017-07-19 | Disposition: A | Discharge status: Home / Self Care | Payer: Medicare Other | Attending: Internal Medicine | Admitting: Internal Medicine

## 2017-07-19 DIAGNOSIS — R748 Abnormal levels of other serum enzymes: Secondary | ICD-10-CM

## 2017-07-19 DIAGNOSIS — R079 Chest pain, unspecified: Secondary | ICD-10-CM | POA: Diagnosis not present

## 2017-07-19 DIAGNOSIS — I340 Nonrheumatic mitral (valve) insufficiency: Secondary | ICD-10-CM | POA: Diagnosis not present

## 2017-07-19 DIAGNOSIS — I241 Dressler's syndrome: Secondary | ICD-10-CM | POA: Diagnosis not present

## 2017-07-19 LAB — BASIC METABOLIC PANEL
Anion gap: 9 (ref 5–15)
BUN: 16 mg/dL (ref 6–20)
CHLORIDE: 103 mmol/L (ref 101–111)
CO2: 26 mmol/L (ref 22–32)
CREATININE: 0.81 mg/dL (ref 0.61–1.24)
Calcium: 8.5 mg/dL — ABNORMAL LOW (ref 8.9–10.3)
GFR calc Af Amer: >60 mL/min (ref >60)
GFR calc non Af Amer: >60 mL/min (ref >60)
GLUCOSE: 464 mg/dL — AB (ref 65–99)
Potassium: 4.1 mmol/L (ref 3.5–5.1)
Sodium: 138 mmol/L (ref 135–145)

## 2017-07-19 LAB — CBC
HCT: 35.5 % — ABNORMAL LOW (ref 40.0–52.0)
Hemoglobin: 12.3 g/dL — ABNORMAL LOW (ref 13.0–18.0)
MCH: 29.6 pg (ref 26.0–34.0)
MCHC: 34.5 g/dL (ref 32.0–36.0)
MCV: 86 fL (ref 80.0–100.0)
PLATELETS: 327 10*3/uL (ref 150–440)
RBC: 4.13 MIL/uL — AB (ref 4.40–5.90)
RDW: 11.8 % (ref 11.5–14.5)
WBC: 6.5 10*3/uL (ref 3.8–10.6)

## 2017-07-19 LAB — GLUCOSE, CAPILLARY
GLUCOSE-CAPILLARY: 296 mg/dL — AB (ref 65–99)
GLUCOSE-CAPILLARY: 187 mg/dL — AB (ref 65–99)
Glucose-Capillary: 258 mg/dL — ABNORMAL HIGH (ref 65–99)

## 2017-07-19 LAB — ECHOCARDIOGRAM COMPLETE
Height: 70 in
Weight: 3520 oz

## 2017-07-19 LAB — HIV ANTIBODY (ROUTINE TESTING W REFLEX): HIV Screen 4th Generation wRfx: NONREACTIVE

## 2017-07-19 LAB — HEPARIN LEVEL (UNFRACTIONATED): Heparin Unfractionated: 0.15 IU/mL — ABNORMAL LOW (ref 0.30–0.70)

## 2017-07-19 MED ORDER — HEPARIN BOLUS VIA INFUSION
3000.0000 [IU] | Freq: Once | INTRAVENOUS | Status: AC
  Administered 2017-07-19: 3000 [IU] via INTRAVENOUS
  Filled 2017-07-19: qty 3000

## 2017-07-19 MED ORDER — INSULIN ASPART 100 UNIT/ML ~~LOC~~ SOLN
6.0000 [IU] | Freq: Three times a day (TID) | SUBCUTANEOUS | Status: DC
  Administered 2017-07-19 (×3): 6 [IU] via SUBCUTANEOUS
  Filled 2017-07-19 (×3): qty 1

## 2017-07-19 MED ORDER — COLCHICINE 0.6 MG PO TABS: 0.6000 mg | ORAL_TABLET | Freq: Two times a day (BID) | ORAL | 0 refills | Status: DC

## 2017-07-19 MED ORDER — GLUCOSE BLOOD VI STRP: ORAL_STRIP | 12 refills | Status: AC

## 2017-07-19 NOTE — Progress Notes (Signed)
*  PRELIMINARY RESULTS* Echocardiogram 2D Echocardiogram has been performed.  Cristela Blue 07/19/2017, 4:58 PM

## 2017-07-19 NOTE — Progress Notes (Addendum)
Inpatient Diabetes Program Recommendations  AACE/ADA: New Consensus Statement on Inpatient Glycemic Control (2015)  Target Ranges:  Prepandial:   less than 140 mg/dL      Peak postprandial:   less than 180 mg/dL (1-2 hours)      Critically ill patients:  140 - 180 mg/dL   Results for [name], [name] (MRN [id number]) as of 07/19/2017 12:40  Ref. Range 07/19/2017 07:49 07/19/2017 12:07  Glucose-Capillary Latest Ref Range: 65 - 99 mg/dL 296 (H) 258 (H)   Results for [name], [name] (MRN [id number]) as of 07/19/2017 12:40  Ref. Range 07/11/2017 02:16  Hemoglobin A1C Latest Ref Range: 4.8 - 5.6 % 12.6 (H)    Home Insulin: 70/30 Insulin- 15 units TID  Current Insulin Orders: Levemir 10 units BID      Novolog Sensitive Correction Scale/ SSI (0-9 units) TID AC + HS      Novolog 6 units TID with meals       Met with patient today to discuss elevated A1c of 12.6%.  DM Coordinator spoke with patient during previous admission last week about his elevated A1c as well.  Spoke with patient about his current A1c of 12.6%.  Explained what an A1c is and what it measures.  Reminded patient that his goal A1c is 7% or less per ADA standards to prevent both acute and long-term complications.  Explained to patient the extreme importance of good glucose control at home.  Encouraged patient to check his CBGs at least TID AC at home and to record all CBGs in a logbook for his PCP to review.  Patient told me he sees a PCP at the [facility].  Is supposed to be taking Levemir BID + Novolog, however, patient stated to me that he decided to not take these insulins due to the extreme cost of them and instead self-converted to Reli-On 70/30 insulin from [employer] (15 units TID with meals at home).  Has been self-titrating insulins.  Has also been using expired CBG meter strips b/c he did not want to waste them.  Patient states he has Medicare for disability but does not have the Part D Rx coverage.  Plans to sign up for  Part D coverage this year but will not have coverage until January 1st.  Discussed with patient the need to stop using expired CBG meter strips at home.  Patient unsure if his Medicare will cover the cost of CBG meter strips.  Asked me to please ask MD for a Rx for CBG meter strips at time of d/c- Will ask MD today.  Suggested to patient that he can also purchase a generic CBG meter at [facility] after d/c out of pocket for the time being so he can have accurate CBGs.  Encouraged patient to make a follow-up appt with his PCP for further DM management.  Discussed with patient that self-titration of insulins can be dangerous and that he would benefit from having a physician review his CBGs and make insulin adjustments.  Also encouraged pt to seek care under an Endocrinologist since he is so young and already taking insulin.  [facility] information in chart so patient can call to schedule an appt.    --Will follow patient during hospitalization--  Wyn Quaker RN, MSN, CDE Diabetes Coordinator Inpatient Glycemic Control Team Team Pager: [phone] (8a-5p)

## 2017-07-19 NOTE — Progress Notes (Signed)
Patient is discharge home in a stable condition,summary and f/u care given verbalized understanding  

## 2017-07-19 NOTE — Discharge Instructions (Signed)
[facility]  [facility] [address] [place], [place] [address]    [phone] - Appointments  [facility] [address] [place], [place] [address]    [phone] - Appointments  New patient appointments require a physician referral. Please ask your PCP to make a referral for you.

## 2017-07-19 NOTE — Progress Notes (Signed)
[facility]         [place], [place].   07/19/2017  Patient: [name]   Date of Birth:  1981/02/09  Date of admission:  07/18/2017  Date of Discharge  07/19/2017    To Whom it May Concern:   [name]  Was admitted on 07/18/2017 and discharged on 07/19/2017.   If you have any questions or concerns, please don't hesitate to call.  Sincerely,   Milagros Loll R M.D Office : [phone]   .

## 2017-07-19 NOTE — Progress Notes (Signed)
ANTICOAGULATION CONSULT NOTE - Initial Consult  Pharmacy Consult for Heparin Drip Indication: chest pain/ACS  Allergies  Allergen Reactions  . Nitroglycerin Nausea And Vomiting    Patient Measurements: Height:  (177.8 cm) Weight: 220 lb (99.8 kg) IBW/kg (Calculated) : 73 Heparin Dosing Weight: 93.8 kg  Vital Signs: Temp: 97.5 F (36.4 C) (10/08 1919) Temp Source: Oral (10/08 1919) BP: 118/68 (10/08 1919) Pulse Rate: 91 (10/08 1919)  Labs:  Recent Labs  07/18/17 1143 07/18/17 1144 07/18/17 1458 07/18/17 1812 07/18/17 2030 07/19/17 0001  HGB 13.0  --   --   --   --  12.3*  HCT 38.7*  --   --   --   --  35.5*  PLT 379  --   --   --   --  327  APTT  --  26  --   --   --   --   LABPROT  --  12.6  --   --   --   --   INR  --  0.95  --   --   --   --   HEPARINUNFRC  --   --   --  0.32  --  0.15*  CREATININE 0.85  --   --   --   --  0.81  TROPONINI 0.88*  --  0.82*  --  0.79*  --     Estimated Creatinine Clearance: 147.8 mL/min (by C-G formula based on SCr of 0.81 mg/dL).   Medical History: Past Medical History:  Diagnosis Date  . Coronary atherosclerosis of artery bypass graft    a. 10/2006 s/p PCI/stenting of LCX;  b. 02/2007 s/p CABG x 4 @ age 9 (LIMA->LAD, RIMA->Diag, VG->OM, VG->RPDA;  c. 06/2017 Lateral STEMI/PCI: LM 50, LAD 100ost, 40m, RI small, LCX 95ost/p (PTCA - 2.76mm balloon), 70p/m ISR, OM2 75, RCA 100p/m, VG->RPDA 25, VG->OM2 80ost (3.0x15 Sierra DES), RIMA->Diag 100, LIMA->LAD nl.  . HFrEF (heart failure with reduced ejection fraction) (HCC)    a. 07/2017 Echo: EF 35-40%  . HLD (hyperlipidemia)    poorly controlled  . HTN (hypertension)   . Hx-TIA (transient ischemic attack) 2009   possible-([facility]) Normal MRI of brain and MRA of the head and neck  . Ischemic cardiomyopathy    a. 07/2017 Echo: EF 35-40%, apicalanteroseptal and apical AK, GR2 DD, mildly to mod reduced RV fxn.  . Kidney stone   . Noncompliance   . Obesity, unspecified   .  Uncontrolled diabetes mellitus with complications (HCC)    a. Dx 2002 - poorly controlled in setting of noncompliance;  b. 07/2017 HbA1c 12.6.    Medications:  Scheduled:  . aspirin EC  81 mg Oral Daily  . colchicine  0.6 mg Oral BID  . heparin  3,000 Units Intravenous Once  . insulin aspart  0-5 Units Subcutaneous QHS  . insulin aspart  0-9 Units Subcutaneous TID WC  . insulin detemir  10 Units Subcutaneous BID  . losartan  25 mg Oral QHS  . metoprolol tartrate  25 mg Oral BID  .  morphine injection  4 mg Intravenous Once  . multivitamin with minerals  1 tablet Oral Daily  . ticagrelor  90 mg Oral BID   Infusions:  . heparin 1,200 Units/hr (07/18/17 1244)    Assessment: 36 yo M to start Heparin Drip for CP, ACS/STEMI. Patient had stent placed last week and hx of MI. Patient on Brilinta/ASA at home. Hgb 13.0  Plt 379  INR pending, aPTT pending  Goal of Therapy:  Heparin level 0.3-0.7 units/ml Monitor platelets by anticoagulation protocol: Yes   Plan:  Give 4000 units bolus x 1 Start heparin infusion at 1200 units/hr Check anti-Xa level in 6 hours and daily while on heparin Continue to monitor H&H and platelets   10/8:  HL @ 18:00 = 0.32 Will continue this pt on current rate and recheck HL on 10/09 @ 00:00.   10/09 @ 0000 HL 0.15 subtherapeutic. Will rebolus w/ heparin 3000 units IV x 1 and will increase rate to 1400 units/hr and will recheck HL @ 0700.  Thomasene Ripple, PharmD, BCPS Clinical Pharmacist 07/19/2017

## 2017-07-19 NOTE — Progress Notes (Signed)
Progress Note  Patient Name: [name] Date of Encounter: 07/19/2017  Primary Cardiologist: Concha Se, MD   Subjective   Feeling much better. Still has some chest discomfort with position changes but no further pleuritic pain.  awaiting echo. Eager to go home.  Inpatient Medications    Scheduled Meds: . aspirin EC  81 mg Oral Daily  . colchicine  0.6 mg Oral BID  . insulin aspart  0-5 Units Subcutaneous QHS  . insulin aspart  0-9 Units Subcutaneous TID WC  . insulin aspart  6 Units Subcutaneous TID WC  . insulin detemir  10 Units Subcutaneous BID  . losartan  25 mg Oral QHS  . metoprolol tartrate  25 mg Oral BID  .  morphine injection  4 mg Intravenous Once  . multivitamin with minerals  1 tablet Oral Daily  . ticagrelor  90 mg Oral BID   Continuous Infusions:  PRN Meds: acetaminophen **OR** acetaminophen, HYDROcodone-acetaminophen, morphine injection, ondansetron **OR** ondansetron (ZOFRAN) IV   Vital Signs    Vitals:   07/18/17 1450 07/18/17 1919 07/19/17 0321 07/19/17 0834  BP: 122/79 118/68 117/76 129/80  Pulse: 70 91 85 86  Resp:  Temp: (!) 97.5 F (36.4 C) (!) 97.5 F (36.4 C) 97.8 F (36.6 C) 98.3 F (36.8 C)  TempSrc: Oral Oral Oral Oral  SpO2: 100% 99% 98% 99%  Weight:      Height:        Intake/Output Summary (Last 24 hours) at 07/19/17 1114 Last data filed at 07/19/17 1046  Gross per 24 hour  Intake            954.8 ml  Output              750 ml  Net            204.8 ml   Filed Weights   07/18/17 1134  Weight: 220 lb (99.8 kg)    Physical Exam   GEN: Well nourished, well developed, in no acute distress.  HEENT: Grossly normal.  Neck: Supple, no JVD, carotid bruits, or masses. Cardiac: RRR, no murmurs, rubs, or gallops. No clubbing, cyanosis, edema.  Radials/DP/PT 2+ and equal bilaterally.  Respiratory:  Respirations regular and unlabored, clear to auscultation bilaterally. GI: Soft, nontender, nondistended, BS + x  4. MS: no deformity or atrophy. Skin: warm and dry, no rash. Neuro:  Strength and sensation are intact. Psych: AAOx3.  Normal affect.  Labs    Chemistry Recent Labs Lab 07/18/17 1143 07/19/17 0001  NA 139 138  K 4.2 4.1  CL 106 103  CO2 26 26  GLUCOSE 223* 464*  BUN 15 16  CREATININE 0.85 0.81  CALCIUM 9.1 8.5*  PROT 7.3  --   ALBUMIN 3.4*  --   AST 20  --   ALT 27  --   ALKPHOS 55  --   BILITOT 0.4  --   GFRNONAA >60 >60  GFRAA >60 >60  ANIONGAP 7 9     Hematology Recent Labs Lab 07/18/17 1143 07/19/17 0001  WBC 6.4 6.5  RBC 4.53 4.13*  HGB 13.0 12.3*  HCT 38.7* 35.5*  MCV 85.5 86.0  MCH 28.8 29.6  MCHC 33.7 34.5  RDW 12.0 11.8  PLT 379 327    Cardiac Enzymes Recent Labs Lab 07/18/17 1143 07/18/17 1458 07/18/17 2030  TROPONINI 0.88* 0.82* 0.79*     Radiology    Dg Chest Port 1 View  Result Date:  07/18/2017 CLINICAL DATA:  Chest pain.  Left arm pain. EXAM: PORTABLE CHEST 1 VIEW COMPARISON:  02/17/2017 FINDINGS: The heart size and mediastinal contours are within normal limits except for surgical changes of CABG. Both lungs are clear. The visualized skeletal structures are unremarkable. IMPRESSION: No acute abnormalities. Electronically Signed   By: Francene Boyers M.D.   On: 07/18/2017 12:20    Telemetry    rsr - Personally Reviewed  Patient Profile     36 y.o. male with a history of CAD s/p prior CABG and recent lateral STEMI, ICM, HFrEF, HL, HTN, TIA, poorly controlled IDDM, and noncompliance readmitted October 7 with recurrent chest pain and downtrending troponins.  Assessment & Plan    1. Chest pain/coronary artery disease-lateral STEMI, subsequent episode of care: Status post recent lateral STEMI requiring PTCA of the native left circumflex complex involving stent placement to the ostial vein graft   OM.  Presented back to the emergency department October 8 with complaints of musculoskeletal and pleuritic chest pain. Troponin trending down  from last admission. Colchicine started and feeling much better. Echo pending to rule out effusion. Provided that echo looks okay, would not pursue any additional ischemic evaluation. Recommend continuation of colchicine 0.6 mg twice a day 2-3 months for possible pericarditis. Otherwise continue aspirin, brilinta, beta blocker, ARB, and statin therapy.   2. Essential hypertension: Stable on beta blocker and ARB.    3. Hyperlipidemia: LDL was 115 October 1. Continue statin therapy. Plan follow-up lipids as an outpatient in approximately 6 weeks.  4. Insulin-dependent diabetes mellitus: Hemoglobin A1c was 12.6 during last admission. He is now on insulin therapy and reports compliance. To be followed by primary care.   5. Ischemic cardiomyopathy/HFrEF: EF 3540% by echo earlier this month. Euvolemic on exam. Continue beta blocker and ARB.  Signed, Nicolasa Ducking, NP  07/19/2017, 11:14 AM    For questions or updates, please contact   Please consult [email or web address] for contact info under Cardiology/STEMI.

## 2017-07-20 NOTE — Discharge Summary (Signed)
[facility] [place] [facility]   PATIENT NAME: [name]    MR#:  [id number]  DATE OF BIRTH:  1981-09-15  DATE OF ADMISSION:  07/18/2017 ADMITTING PHYSICIAN: Enid Baas, MD  DATE OF DISCHARGE: 07/19/2017  7:21 PM  PRIMARY CARE PHYSICIAN: Marisue Ivan, MD   ADMISSION DIAGNOSIS:  Chest pain, unspecified type [R07.9]  DISCHARGE DIAGNOSIS:  Active Problems:   NSTEMI (non-ST elevated myocardial infarction) (HCC)   SECONDARY DIAGNOSIS:   Past Medical History:  Diagnosis Date  . Coronary atherosclerosis of artery bypass graft    a. 10/2006 s/p PCI/stenting of LCX;  b. 02/2007 s/p CABG x 4 @ age 31 (LIMA->LAD, RIMA->Diag, VG->OM, VG->RPDA;  c. 06/2017 Lateral STEMI/PCI: LM 50, LAD 100ost, 38m, RI small, LCX 95ost/p (PTCA - 2.13mm balloon), 70p/m ISR, OM2 75, RCA 100p/m, VG->RPDA 25, VG->OM2 80ost (3.0x15 Sierra DES), RIMA->Diag 100, LIMA->LAD nl.  . HFrEF (heart failure with reduced ejection fraction) (HCC)    a. 07/2017 Echo: EF 35-40%  . HLD (hyperlipidemia)    poorly controlled  . HTN (hypertension)   . Hx-TIA (transient ischemic attack) 2009   possible-([facility]) Normal MRI of brain and MRA of the head and neck  . Ischemic cardiomyopathy    a. 07/2017 Echo: EF 35-40%, apicalanteroseptal and apical AK, GR2 DD, mildly to mod reduced RV fxn.  . Kidney stone   . Noncompliance   . Obesity, unspecified   . Uncontrolled diabetes mellitus with complications (HCC)    a. Dx 2002 - poorly controlled in setting of noncompliance;  b. 07/2017 HbA1c 12.6.     ADMITTING HISTORY  HISTORY OF PRESENT ILLNESS:  [name]  is a 36 y.o. 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  Patient had chest pain 2 weeks ago almost like a  muscle ache that he waited out for a week before he went to the hospital. Then he was noted to have NSTEMI and had a cardiac catheter done and had balloon angioplasty to one vessel and a stent placement with 0% residual obstruction. He was discharged on aspirin, Brillinta. He has uncontrolled diabetes mellitus due to inability to afford for his insulin. He is currently taking questionable over the counter 70-30 insulin 3 times a day. His A1c last week was 12.6. He has been taking his medications like he was supposed to, however had a significant chest pressure yesterday Associated with the pain radiating down his left arm and also has some left neck pain. Complains of sweats and nausea associated with it. He called his PCP today who has advised him to come to the emergency room. His first troponin is elevated at 0.88.   HOSPITAL COURSE:   * Chest pain. Patient was admitted due to concern for possible non-ST elevation MI with his recent history of stent. Patient was admitted to telemetry floor. Started on heparin drip. His troponin slowly trended down and heparin was stopped. Echocardiogram checked showed no acute changes. His pain is thought to be likely due to post-MI pericarditis. Discussed case with cardiology team/Dr. and. Suggested colchicine. Prescription given. Patient discharged home in stable condition. All other medications remain same.  Patient's diabetes, hypertension remained stable during the hospital stay.    CONSULTS OBTAINED:  Treatment Team:  Iran Ouch,  MD  DRUG ALLERGIES:   Allergies  Allergen Reactions  . Nitroglycerin Nausea And Vomiting    DISCHARGE MEDICATIONS:   Discharge Medication List as of 07/19/2017  6:41 PM    START taking these medications   Details  colchicine 0.6 MG tablet Take 1 tablet (0.6 mg total) by mouth 2 (two) times daily., Starting Tue 07/19/2017, Until Wed 07/19/2018, Print    glucose blood test strip Use as instructed, Print       CONTINUE these medications which have NOT CHANGED   Details  aspirin EC 81 MG tablet Take 81 mg by mouth daily., Historical Med    insulin aspart protamine- aspart (NOVOLOG MIX 70/30) (70-30) 100 UNIT/ML injection Inject 15 Units into the skin 3 (three) times daily., Historical Med    losartan (COZAAR) 25 MG tablet Take 1 tablet (25 mg total) by mouth at bedtime., Starting Tue 07/12/2017, Until Wed 07/12/2018, Print    metoprolol tartrate (LOPRESSOR) 25 MG tablet Take 1 tablet (25 mg total) by mouth 2 (two) times daily., Starting Tue 07/12/2017, Until Wed 07/12/2018, Print    Multiple Vitamin (MULTIVITAMIN) tablet Take 2 tablets by mouth daily. , Historical Med    ticagrelor (BRILINTA) 90 MG TABS tablet Take 1 tablet (90 mg total) by mouth 2 (two) times daily., Starting Tue 07/12/2017, Normal        Today   VITAL SIGNS:  Blood pressure 119/84, pulse 80, temperature 97.6 F (36.4 C), temperature source Oral, resp. rate 20, height  (1.778 m), weight 99.8 kg (220 lb), SpO2 100 %.  I/O:   Intake/Output Summary (Last 24 hours) at 07/20/17 1511 Last data filed at 07/19/17 1840  Gross per 24 hour  Intake              240 ml  Output                0 ml  Net              240 ml    PHYSICAL EXAMINATION:  Physical Exam  GENERAL:  36 y.o.-year-old patient lying in the bed with no acute distress.  LUNGS: Normal breath sounds bilaterally, no wheezing, rales,rhonchi or crepitation. No use of accessory muscles of respiration.  CARDIOVASCULAR: S1, S2 normal. No murmurs, rubs, or gallops.  ABDOMEN: Soft, non-tender, non-distended. Bowel sounds present. No organomegaly or mass.  NEUROLOGIC: Moves all 4 extremities. PSYCHIATRIC: The patient is alert and oriented x 3.  SKIN: No obvious rash, lesion, or ulcer.   DATA REVIEW:   CBC  Recent Labs Lab 07/19/17 0001  WBC 6.5  HGB 12.3*  HCT 35.5*  PLT 327    Chemistries   Recent Labs Lab 07/18/17 1143 07/19/17 0001  NA 139 138   K 4.2 4.1  CL 106 103  CO2 26 26  GLUCOSE 223* 464*  BUN 15 16  CREATININE 0.85 0.81  CALCIUM 9.1 8.5*  AST 20  --   ALT 27  --   ALKPHOS 55  --   BILITOT 0.4  --     Cardiac Enzymes  Recent Labs Lab 07/18/17 2030  TROPONINI 0.79*    Microbiology Results  Results for orders placed or performed during the hospital encounter of 07/10/17  MRSA PCR Screening     Status: None   Collection Time: 07/10/17  4:09 PM  Result Value Ref Range Status   MRSA by PCR NEGATIVE NEGATIVE Final    Comment:  The GeneXpert MRSA Assay (FDA approved for NASAL specimens only), is one component of a comprehensive MRSA colonization surveillance program. It is not intended to diagnose MRSA infection nor to guide or monitor treatment for MRSA infections.     RADIOLOGY:  No results found.  Follow up with PCP in 1 week.  Management plans discussed with the patient, family and they are in agreement.  CODE STATUS:  Code Status History    Date Active Date Inactive Code Status Order ID Comments User Context   07/18/2017  2:41 PM 07/19/2017 10:26 PM Full Code [id number]  Enid Baas, MD Inpatient   07/10/2017  3:42 PM 07/12/2017  5:46 PM Full Code [id number]  Corky Crafts, MD Inpatient   09/28/2015  5:19 PM 09/29/2015  4:03 PM Full Code [id number]  Auburn Bilberry, MD Inpatient   02/16/2012  3:08 AM 02/18/2012  3:46 PM Full Code [id number]  Eduard Clos, MD Inpatient      TOTAL TIME TAKING CARE OF THIS PATIENT ON DAY OF DISCHARGE: more than 30 minutes.   Milagros Loll R M.D on 07/20/2017 at 3:11 PM  Between 7am to 6pm - Pager - [phone]  After 6pm go to [email or web address] - password [email or web address] [facility]  [facility] [place] [facility]  Office  [phone]  CC: Primary care physician; Marisue Ivan, MD  Note: This dictation was prepared with Dragon dictation along with smaller phrase technology. Any transcriptional errors that result from this process are  unintentional.

## 2017-08-16 ENCOUNTER — Telehealth: Payer: Self-pay | Admitting: Cardiovascular Disease

## 2017-08-16 NOTE — Telephone Encounter (Signed)
Patient calling the office for samples/ COUPONS of medication:   1.  What medication and dosage are you requesting samples for?  BRILINTA 90 MG PO BID  2.  Are you currently out of this medication? Yes

## 2017-08-16 NOTE — Telephone Encounter (Signed)
Would you like me to provide samples for this patient.  Looks like brilinta was given to him by a different provider but does have a follow up appointment with Dr. Mariah MillingGollan 08/22/2017

## 2017-08-16 NOTE — Telephone Encounter (Signed)
Samples placed up from along with Patient assistant form and coupon card.   Medication Samples have been provided to the patient.  Drug name: Brilinta       Strength: 90MG         Qty: 3 bottles  LOT: [id number]  Exp.Date: 12/2019   Margrett RudBrittany N Areana Kosanke [id number] PM 08/16/2017

## 2017-08-16 NOTE — Telephone Encounter (Signed)
We could give him samples for new start, coupon, and information for assistance if needed.

## 2017-08-16 NOTE — Telephone Encounter (Signed)
Pt dropped off [employer] forms to be signed and completed Placed in Nurse Box

## 2017-08-16 NOTE — Telephone Encounter (Signed)
Spoke with patient and made him aware that forms have been completed and placed up front for them to pick up tomorrow. He was appreciative for the call and had no further questions at this time.

## 2017-08-16 NOTE — Telephone Encounter (Signed)
Notified pt he will need to provide documentation of income on PAF. [facility]. Pt understands to pick up form, attach supporting documentation and mail or fax to [employer]ME.

## 2017-08-18 ENCOUNTER — Encounter: Payer: Self-pay | Admitting: *Deleted

## 2017-08-18 ENCOUNTER — Encounter: Payer: Medicare Other | Attending: Cardiovascular Disease | Admitting: *Deleted

## 2017-08-18 VITALS — Ht 71.5 in | Wt 239.8 lb

## 2017-08-18 DIAGNOSIS — Z955 Presence of coronary angioplasty implant and graft: Secondary | ICD-10-CM | POA: Insufficient documentation

## 2017-08-18 DIAGNOSIS — I2129 ST elevation (STEMI) myocardial infarction involving other sites: Secondary | ICD-10-CM | POA: Diagnosis present

## 2017-08-18 DIAGNOSIS — I213 ST elevation (STEMI) myocardial infarction of unspecified site: Secondary | ICD-10-CM

## 2017-08-18 NOTE — Progress Notes (Signed)
Daily Session Note  Patient Details  Name: [name] MRN: [id number] Date of Birth: [birth year] Referring Provider:     Cardiac Rehab from 08/18/2017 in [facility] Cardiac [facility]  Referring Provider  Gollan      Encounter Date: 08/18/2017  Check In: Session Check In - 08/18/17 1438      Check-In   Location  [facility]-Cardiac & Pulmonary Rehab    Staff Present  Renita Papa, RN Vickki Hearing, [place], ACSM CEP, Exercise Physiologist    Supervising physician immediately available to respond to emergencies  See telemetry face sheet for immediately available ER MD    Medication changes reported      No    Fall or balance concerns reported     No    Tobacco Cessation  No Change quit in 2006   quit in 2006   Warm-up and Cool-down  Performed as group-led instruction    Resistance Training Performed  Yes    VAD Patient?  No      Pain Assessment   Currently in Pain?  No/denies        Exercise Prescription Changes - 08/18/17 1400      Response to Exercise   Blood Pressure (Admit)  112/66    Blood Pressure (Exercise)  124/66    Blood Pressure (Exit)  114/66    Heart Rate (Admit)  78 bpm    Heart Rate (Exercise)  101 bpm    Heart Rate (Exit)  90 bpm    Rating of Perceived Exertion (Exercise)  11       Social History   Tobacco Use  Smoking Status Former Smoker  . Packs/day: 2.00  . Years: 14.00  . Pack years: 28.00  . Types: Cigarettes  . Last attempt to quit: 10/11/2004  . Years since quitting: 12.8  Smokeless Tobacco Former Systems developer  . Types: Chew, Snuff  Tobacco Comment   used to smoke 1 1/2 ppd, quit 2006    Goals Met:  Proper associated with RPD/PD & O2 Sat Exercise tolerated well Personal goals reviewed No report of cardiac concerns or symptoms Strength training completed today  Goals Unmet:  Not Applicable  Comments: Med Review completed    Dr. Emily Filbert is Medical Director for [place] and [facility].

## 2017-08-18 NOTE — Patient Instructions (Signed)
Patient Instructions  Patient Details  Name: [name] MRN: [id number] Date of Birth: [birth year] Referring Provider:  Antonieta Iba, MD  Below are the personal goals you chose as well as exercise and nutrition goals. Our goal is to help you keep on track towards obtaining and maintaining your goals. We will be discussing your progress on these goals with you throughout the program.  Initial Exercise Prescription: Initial Exercise Prescription - 08/18/17 1400      Date of Initial Exercise RX and Referring Provider   Date  08/18/17    Referring Provider  Gollan      Treadmill   MPH  2.5    Grade  2    Minutes  15    METs  3.6      Recumbant Bike   Level  3    RPM  60    Watts  70    Minutes  15    METs  4.2      REL-XR   Level  3    Watts  70    Speed  50    Minutes  15    METs  4.2      Prescription Details   Frequency (times per week)  3    Duration  Progress to 45 minutes of aerobic exercise without signs/symptoms of physical distress      Intensity   THRR 40-80% of Max Heartrate  119-161    Ratings of Perceived Exertion  11-13    Perceived Dyspnea  0-4      Resistance Training   Training Prescription  Yes    Weight  4 lbs    Reps  10-15       Exercise Goals: Frequency: Be able to perform aerobic exercise three times per week working toward 3-5 days per week.  Intensity: Work with a perceived exertion of 11 (fairly light) - 15 (hard) as tolerated. Follow your new exercise prescription and watch for changes in prescription as you progress with the program. Changes will be reviewed with you when they are made.  Duration: You should be able to do 30 minutes of continuous aerobic exercise in addition to a 5 minute warm-up and a 5 minute cool-down routine.  Nutrition Goals: Your personal nutrition goals will be established when you do your nutrition analysis with the dietician.  The following are nutrition guidelines to follow: Cholesterol <  200mg /day Sodium < 1500mg /day Fiber: Men under 50 yrs - 38 grams per day  Personal Goals: Personal Goals and Risk Factors at Admission - 08/18/17 1447      Core Components/Risk Factors/Patient Goals on Admission    Weight Management  Yes;Weight Loss    Intervention  Weight Management: Develop a combined nutrition and exercise program designed to reach desired caloric intake, while maintaining appropriate intake of nutrient and fiber, sodium and fats, and appropriate energy expenditure required for the weight goal.;Weight Management: Provide education and appropriate resources to help participant work on and attain dietary goals.;Weight Management/Obesity: Establish reasonable short term and long term weight goals.    Admit Weight  239 lb 12.8 oz (108.8 kg)    Goal Weight: Short Term  234 lb (106.1 kg)    Goal Weight: Long Term  200 lb (90.7 kg)    Expected Outcomes  Short Term: Continue to assess and modify interventions until short term weight is achieved;Long Term: Adherence to nutrition and physical activity/exercise program aimed toward attainment of established weight goal;Weight Maintenance: Understanding  of the daily nutrition guidelines, which includes 25-35% calories from fat, 7% or less cal from saturated fats, less than 200mg  cholesterol, less than 1.5gm of sodium, & 5 or more servings of fruits and vegetables daily;Weight Loss: Understanding of general recommendations for a balanced deficit meal plan, which promotes 1-2 lb weight loss per week and includes a negative energy balance of [phone] kcal/d;Understanding recommendations for meals to include 15-35% energy as protein, 25-35% energy from fat, 35-60% energy from carbohydrates, less than 200mg  of dietary cholesterol, 20-35 gm of total fiber daily;Understanding of distribution of calorie intake throughout the day with the consumption of 4-5 meals/snacks    Diabetes  Yes    Intervention  Provide education about signs/symptoms and action  to take for hypo/hyperglycemia.;Provide education about proper nutrition, including hydration, and aerobic/resistive exercise prescription along with prescribed medications to achieve blood glucose in normal ranges: Fasting glucose 65-99 mg/dL    Expected Outcomes  Long Term: Attainment of HbA1C < 7%.;Short Term: Participant verbalizes understanding of the signs/symptoms and immediate care of hyper/hypoglycemia, proper foot care and importance of medication, aerobic/resistive exercise and nutrition plan for blood glucose control.    Hypertension  Yes    Intervention  Provide education on lifestyle modifcations including regular physical activity/exercise, weight management, moderate sodium restriction and increased consumption of fresh fruit, vegetables, and low fat dairy, alcohol moderation, and smoking cessation.;Monitor prescription use compliance.    Expected Outcomes  Short Term: Continued assessment and intervention until BP is < 140/7190mm HG in hypertensive participants. < 130/10180mm HG in hypertensive participants with diabetes, heart failure or chronic kidney disease.;Long Term: Maintenance of blood pressure at goal levels.    Stress  Yes [name] has a lot of financial stress related to his disability being under investigation. Financially, his family is struggling even with his disabily money and is having issues getting medication.    [name] has a lot of financial stress related to his disability being under investigation. Financially, his family is struggling even with his disabily money and is having issues getting medication.    Intervention  Offer individual and/or small group education and counseling on adjustment to heart disease, stress management and health-related lifestyle change. Teach and support self-help strategies.;Refer participants experiencing significant psychosocial distress to appropriate mental health specialists for further evaluation and treatment. When possible, include family  members and significant others in education/counseling sessions.    Expected Outcomes  Short Term: Participant demonstrates changes in health-related behavior, relaxation and other stress management skills, ability to obtain effective social support, and compliance with psychotropic medications if prescribed.;Long Term: Emotional wellbeing is indicated by absence of clinically significant psychosocial distress or social isolation.       Tobacco Use Initial Evaluation: Social History   Tobacco Use  Smoking Status Former Smoker  . Packs/day: 2.00  . Years: 14.00  . Pack years: 28.00  . Types: Cigarettes  . Last attempt to quit: 10/11/2004  . Years since quitting: 12.8  Smokeless Tobacco Former NeurosurgeonUser  . Types: Chew, Snuff  Tobacco Comment   used to smoke 1 1/2 ppd, quit 2006    Exercise Goals and Review: Exercise Goals    Row Name 08/18/17 1416             Exercise Goals   Increase Physical Activity  Yes       Intervention  Provide advice, education, support and counseling about physical activity/exercise needs.;Develop an individualized exercise prescription for aerobic and resistive training based on initial evaluation findings, risk  stratification, comorbidities and participant's personal goals.       Expected Outcomes  Achievement of increased cardiorespiratory fitness and enhanced flexibility, muscular endurance and strength shown through measurements of functional capacity and personal statement of participant.       Increase Strength and Stamina  Yes       Intervention  Provide advice, education, support and counseling about physical activity/exercise needs.;Develop an individualized exercise prescription for aerobic and resistive training based on initial evaluation findings, risk stratification, comorbidities and participant's personal goals.       Expected Outcomes  Achievement of increased cardiorespiratory fitness and enhanced flexibility, muscular endurance and strength  shown through measurements of functional capacity and personal statement of participant.       Able to understand and use rate of perceived exertion (RPE) scale  Yes       Intervention  Provide education and explanation on how to use RPE scale       Expected Outcomes  Short Term: Able to use RPE daily in rehab to express subjective intensity level;Long Term:  Able to use RPE to guide intensity level when exercising independently       Able to understand and use Dyspnea scale  Yes       Intervention  Provide education and explanation on how to use Dyspnea scale       Expected Outcomes  Short Term: Able to use Dyspnea scale daily in rehab to express subjective sense of shortness of breath during exertion;Long Term: Able to use Dyspnea scale to guide intensity level when exercising independently       Knowledge and understanding of Target Heart Rate Range (THRR)  Yes       Intervention  Provide education and explanation of THRR including how the numbers were predicted and where they are located for reference       Expected Outcomes  Short Term: Able to state/look up THRR;Long Term: Able to use THRR to govern intensity when exercising independently;Short Term: Able to use daily as guideline for intensity in rehab       Able to check pulse independently  Yes       Intervention  Provide education and demonstration on how to check pulse in carotid and radial arteries.;Review the importance of being able to check your own pulse for safety during independent exercise       Expected Outcomes  Short Term: Able to explain why pulse checking is important during independent exercise;Long Term: Able to check pulse independently and accurately       Understanding of Exercise Prescription  Yes       Intervention  Provide education, explanation, and written materials on patient's individual exercise prescription       Expected Outcomes  Short Term: Able to explain program exercise prescription;Long Term: Able to  explain home exercise prescription to exercise independently          Copy of goals given to participant.

## 2017-08-18 NOTE — Progress Notes (Signed)
Cardiac Individual Treatment Plan  Patient Details  Name: [name] MRN: [id number] Date of Birth: [birth year] Referring Provider:     Cardiac Rehab from 08/18/2017 in [facility] Cardiac and Pulmonary Rehab  Referring Provider  Gollan      Initial Encounter Date:    Cardiac Rehab from 08/18/2017 in [facility] Cardiac and Pulmonary Rehab  Date  08/18/17  Referring Provider  Mariah Milling      Visit Diagnosis: ST elevation myocardial infarction (STEMI), unspecified artery [facility])  Status post coronary artery stent placement  Patient's Home Medications on Admission:  Current Outpatient Medications:  .  aspirin EC 81 MG tablet, Take 81 mg by mouth daily., Disp: , Rfl:  .  insulin aspart protamine- aspart (NOVOLOG MIX 70/30) (70-30) 100 UNIT/ML injection, Inject 15 Units into the skin 3 (three) times daily., Disp: , Rfl:  .  losartan (COZAAR) 25 MG tablet, Take 1 tablet (25 mg total) by mouth at bedtime., Disp: 30 tablet, Rfl: 0 .  metoprolol tartrate (LOPRESSOR) 25 MG tablet, Take 1 tablet (25 mg total) by mouth 2 (two) times daily., Disp: 60 tablet, Rfl: 0 .  Multiple Vitamin (MULTIVITAMIN) tablet, Take 2 tablets by mouth daily. , Disp: , Rfl:  .  ticagrelor (BRILINTA) 90 MG TABS tablet, Take 1 tablet (90 mg total) by mouth 2 (two) times daily., Disp: 60 tablet, Rfl: 2 .  colchicine 0.6 MG tablet, Take 1 tablet (0.6 mg total) by mouth 2 (two) times daily., Disp: 60 tablet, Rfl: 0 .  glucose blood test strip, Use as instructed, Disp: 100 each, Rfl: 12  Past Medical History: Past Medical History:  Diagnosis Date  . Coronary atherosclerosis of artery bypass graft    a. 10/2006 s/p PCI/stenting of LCX;  b. 02/2007 s/p CABG x 4 @ age 89 (LIMA->LAD, RIMA->Diag, VG->OM, VG->RPDA;  c. 06/2017 Lateral STEMI/PCI: LM 50, LAD 100ost, 53m, RI small, LCX 95ost/p (PTCA - 2.46mm balloon), 70p/m ISR, OM2 75, RCA 100p/m, VG->RPDA 25, VG->OM2 80ost (3.0x15 Sierra DES), RIMA->Diag 100, LIMA->LAD nl.  . HFrEF (heart failure  with reduced ejection fraction) (HCC)    a. 07/2017 Echo: EF 35-40%  . HLD (hyperlipidemia)    poorly controlled  . HTN (hypertension)   . Hx-TIA (transient ischemic attack) 2009   possible-([facility]) Normal MRI of brain and MRA of the head and neck  . Ischemic cardiomyopathy    a. 07/2017 Echo: EF 35-40%, apicalanteroseptal and apical AK, GR2 DD, mildly to mod reduced RV fxn.  . Kidney stone   . Noncompliance   . Obesity, unspecified   . Uncontrolled diabetes mellitus with complications (HCC)    a. Dx 2002 - poorly controlled in setting of noncompliance;  b. 07/2017 HbA1c 12.6.    Tobacco Use: Social History   Tobacco Use  Smoking Status Former Smoker  . Packs/day: 2.00  . Years: 14.00  . Pack years: 28.00  . Types: Cigarettes  . Last attempt to quit: 10/11/2004  . Years since quitting: 12.8  Smokeless Tobacco Former Neurosurgeon  . Types: Chew, Snuff  Tobacco Comment   used to smoke 1 1/2 ppd, quit 2006    Labs: Recent Review Flowsheet Data    Labs for ITP Cardiac and Pulmonary Rehab Latest Ref Rng & Units 05/30/2015 06/02/2015 09/29/2015 07/10/2017 07/11/2017   Cholestrol 0 - 200 mg/dL - - - 409(W) 119(J)   LDLCALC 0 - 99 mg/dL - - - 478(G) 956(O)   LDLDIRECT mg/dL - - - - -   HDL >13  mg/dL - - - 16(X33(L) 09(U24(L)   Trlycerides <150 mg/dL - - - 045148 409(W170(H)   Hemoglobin A1c 4.8 - 5.6 % - 12.0(H) 11.4(H) - 12.6(H)   PHART 7.350 - 7.450 - - - - -   PCO2ART 35.0 - 45.0 mmHg - - - - -   HCO3 21.0 - 28.0 mEq/L 24.3 - - - -   TCO2 0 - 100 mmol/L - - - - -   ACIDBASEDEF 0.0 - 2.0 mmol/L 1.3 - - - -   O2SAT % - - - - -       Exercise Target Goals: Date: 08/18/17  Exercise Program Goal: Individual exercise prescription set with THRR, safety & activity barriers. Participant demonstrates ability to understand and report RPE using BORG scale, to self-measure pulse accurately, and to acknowledge the importance of the exercise prescription.  Exercise Prescription Goal: Starting with aerobic  activity 30 plus minutes a day, 3 days per week for initial exercise prescription. Provide home exercise prescription and guidelines that participant acknowledges understanding prior to discharge.  Activity Barriers & Risk Stratification: Activity Barriers & Cardiac Risk Stratification - 08/18/17 1459      Activity Barriers & Cardiac Risk Stratification   Activity Barriers  Back Problems;Chest Pain/Angina;Shortness of Breath    Cardiac Risk Stratification  High       6 Minute Walk: 6 Minute Walk    Row Name 08/18/17 1417         6 Minute Walk   Distance  1225 feet     Walk Time  6 minutes     # of Rest Breaks  0     MPH  2.32     METS  4.23     RPE  11     Perceived Dyspnea   2     VO2 Peak  14.8     Symptoms  No     Resting HR  77 bpm     Resting BP  112/66     Resting Oxygen Saturation   100 %     Exercise Oxygen Saturation  during 6 min walk  100 %     Max Ex. HR  101 bpm     Max Ex. BP  124/66     2 Minute Post BP  114/66        Oxygen Initial Assessment:   Oxygen Re-Evaluation:   Oxygen Discharge (Final Oxygen Re-Evaluation):   Initial Exercise Prescription: Initial Exercise Prescription - 08/18/17 1400      Date of Initial Exercise RX and Referring Provider   Date  08/18/17    Referring Provider  Gollan      Treadmill   MPH  2.5    Grade  2    Minutes  15    METs  3.6      Recumbant Bike   Level  3    RPM  60    Watts  70    Minutes  15    METs  4.2      REL-XR   Level  3    Watts  70    Speed  50    Minutes  15    METs  4.2      Prescription Details   Frequency (times per week)  3    Duration  Progress to 45 minutes of aerobic exercise without signs/symptoms of physical distress      Intensity   THRR 40-80% of Max Heartrate  119-161    Ratings of Perceived Exertion  11-13    Perceived Dyspnea  0-4      Resistance Training   Training Prescription  Yes    Weight  4 lbs    Reps  10-15       Perform Capillary Blood Glucose  checks as needed.  Exercise Prescription Changes: Exercise Prescription Changes    Row Name 08/18/17 1400             Response to Exercise   Blood Pressure (Admit)  112/66       Blood Pressure (Exercise)  124/66       Blood Pressure (Exit)  114/66       Heart Rate (Admit)  78 bpm       Heart Rate (Exercise)  101 bpm       Heart Rate (Exit)  90 bpm       Rating of Perceived Exertion (Exercise)  11          Exercise Comments:   Exercise Goals and Review: Exercise Goals    Row Name 08/18/17 1416             Exercise Goals   Increase Physical Activity  Yes       Intervention  Provide advice, education, support and counseling about physical activity/exercise needs.;Develop an individualized exercise prescription for aerobic and resistive training based on initial evaluation findings, risk stratification, comorbidities and participant's personal goals.       Expected Outcomes  Achievement of increased cardiorespiratory fitness and enhanced flexibility, muscular endurance and strength shown through measurements of functional capacity and personal statement of participant.       Increase Strength and Stamina  Yes       Intervention  Provide advice, education, support and counseling about physical activity/exercise needs.;Develop an individualized exercise prescription for aerobic and resistive training based on initial evaluation findings, risk stratification, comorbidities and participant's personal goals.       Expected Outcomes  Achievement of increased cardiorespiratory fitness and enhanced flexibility, muscular endurance and strength shown through measurements of functional capacity and personal statement of participant.       Able to understand and use rate of perceived exertion (RPE) scale  Yes       Intervention  Provide education and explanation on how to use RPE scale       Expected Outcomes  Short Term: Able to use RPE daily in rehab to express subjective intensity  level;Long Term:  Able to use RPE to guide intensity level when exercising independently       Able to understand and use Dyspnea scale  Yes       Intervention  Provide education and explanation on how to use Dyspnea scale       Expected Outcomes  Short Term: Able to use Dyspnea scale daily in rehab to express subjective sense of shortness of breath during exertion;Long Term: Able to use Dyspnea scale to guide intensity level when exercising independently       Knowledge and understanding of Target Heart Rate Range (THRR)  Yes       Intervention  Provide education and explanation of THRR including how the numbers were predicted and where they are located for reference       Expected Outcomes  Short Term: Able to state/look up THRR;Long Term: Able to use THRR to govern intensity when exercising independently;Short Term: Able to use daily as guideline for intensity in rehab  Able to check pulse independently  Yes       Intervention  Provide education and demonstration on how to check pulse in carotid and radial arteries.;Review the importance of being able to check your own pulse for safety during independent exercise       Expected Outcomes  Short Term: Able to explain why pulse checking is important during independent exercise;Long Term: Able to check pulse independently and accurately       Understanding of Exercise Prescription  Yes       Intervention  Provide education, explanation, and written materials on patient's individual exercise prescription       Expected Outcomes  Short Term: Able to explain program exercise prescription;Long Term: Able to explain home exercise prescription to exercise independently          Exercise Goals Re-Evaluation :   Discharge Exercise Prescription (Final Exercise Prescription Changes): Exercise Prescription Changes - 08/18/17 1400      Response to Exercise   Blood Pressure (Admit)  112/66    Blood Pressure (Exercise)  124/66    Blood Pressure (Exit)   114/66    Heart Rate (Admit)  78 bpm    Heart Rate (Exercise)  101 bpm    Heart Rate (Exit)  90 bpm    Rating of Perceived Exertion (Exercise)  11       Nutrition:  Target Goals: Understanding of nutrition guidelines, daily intake of sodium 1500mg , cholesterol 200mg , calories 30% from fat and 7% or less from saturated fats, daily to have 5 or more servings of fruits and vegetables.  Biometrics: Pre Biometrics - 08/18/17 1416      Pre Biometrics   Height  5' 11.5" (1.816 m)    Weight  239 lb 12.8 oz (108.8 kg)    Waist Circumference  40.5 inches    Hip Circumference  44.25 inches    Waist to Hip Ratio  0.92 %    BMI (Calculated)  32.98    Single Leg Stand  30 seconds        Nutrition Therapy Plan and Nutrition Goals:   Nutrition Discharge: Rate Your Plate Scores: Nutrition Assessments - 08/18/17 1451      MEDFICTS Scores   Pre Score  27       Nutrition Goals Re-Evaluation:   Nutrition Goals Discharge (Final Nutrition Goals Re-Evaluation):   Psychosocial: Target Goals: Acknowledge presence or absence of significant depression and/or stress, maximize coping skills, provide positive support system. Participant is able to verbalize types and ability to use techniques and skills needed for reducing stress and depression.   Initial Review & Psychosocial Screening: Initial Psych Review & Screening - 08/18/17 1451      Initial Review   Current issues with  History of Depression;Current Sleep Concerns;Current Stress Concerns;Current Anxiety/Panic    Source of Stress Concerns  Financial;Occupation;Unable to participate in former interests or hobbies    Comments  [name] is under a lot of stress related to his disability being under investigation. It has been alternating between approved and revoked for almost a year now. He's been on disability for 10 years and has gotten used to that lifestyle, but now it is a struggle to make ends meet, especially when it comes to his  medication. He says he is indifferent about the outcome of his health and annoyed with the lack of consistent help. 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      Family Dynamics  Good Support System?  Yes Wife and son   Wife and son     Screening Interventions   Interventions  Yes;Encouraged to exercise;Program counselor consult    Expected Outcomes  Short Term goal: Utilizing psychosocial counselor, staff and physician to assist with identification of specific Stressors or current issues interfering with healing process. Setting desired goal for each stressor or current issue identified.;Long Term Goal: Stressors or current issues are controlled or eliminated.;Short Term goal: Identification and review with participant of any Quality of Life or Depression concerns found by scoring the questionnaire.;Long Term goal: The participant improves quality of Life and PHQ9 Scores as seen by post scores and/or verbalization of changes       Quality of Life Scores:  Quality of Life - 08/18/17 1455      Quality of Life Scores   Health/Function Pre  19.33 %    Socioeconomic Pre  17.5 %    Psych/Spiritual Pre  18.86 %    Family Pre  20 %    GLOBAL Pre  18.91 %       PHQ-9: Recent Review Flowsheet Data    Depression screen [facility] 2/9 08/18/2017 03/18/2017   Decreased Interest 2 3   Down, Depressed, Hopeless 3 3   PHQ - 2 Score 5 6   Altered sleeping 3 3   Tired, decreased energy 3 3   Change in appetite 2 0   Feeling bad or failure about yourself  3 3   Trouble concentrating 3 3   Moving slowly or fidgety/restless 2 1   Suicidal thoughts 2  0   PHQ-9 Score 23 19   Difficult doing work/chores Very difficult Somewhat difficult     Interpretation of Total Score  Total Score Depression Severity:  1-4 = Minimal depression, 5-9 = Mild depression, 10-14 = Moderate depression, 15-19 = Moderately severe depression, 20-27 = Severe depression   Psychosocial Evaluation and  Intervention:   Psychosocial Re-Evaluation:   Psychosocial Discharge (Final Psychosocial Re-Evaluation):   Vocational Rehabilitation: Provide vocational rehab assistance to qualifying candidates.   Vocational Rehab Evaluation & Intervention: Vocational Rehab - 08/18/17 1458      Initial Vocational Rehab Evaluation & Intervention   Assessment shows need for Vocational Rehabilitation  No       Education: Education Goals: Education classes will be provided on a variety of topics geared toward better understanding of heart health and risk factor modification. Participant will state understanding/return demonstration of topics presented as noted by education test scores.  Learning Barriers/Preferences: Learning Barriers/Preferences - 08/18/17 1457      Learning Barriers/Preferences   Learning Barriers  None    Learning Preferences  None       Education Topics: General Nutrition Guidelines/Fats and Fiber: -Group instruction provided by verbal, written material, models and posters to present the general guidelines for heart healthy nutrition. Gives an explanation and review of dietary fats and fiber.   Controlling Sodium/Reading Food Labels: -Group verbal and written material supporting the discussion of sodium use in heart healthy nutrition. Review and explanation with models, verbal and written materials for utilization of the food label.   Exercise Physiology & Risk Factors: - Group verbal and written instruction with models to review the exercise physiology of the cardiovascular system and associated critical values. Details cardiovascular disease risk factors and the goals associated with each risk factor.   Aerobic Exercise & Resistance Training: - Gives group verbal and written discussion on the health impact of inactivity. On the  components of aerobic and resistive training programs and the benefits of this training and how to safely progress through these  programs.   Flexibility, Balance, General Exercise Guidelines: - Provides group verbal and written instruction on the benefits of flexibility and balance training programs. Provides general exercise guidelines with specific guidelines to those with heart or lung disease. Demonstration and skill practice provided.   Stress Management: - Provides group verbal and written instruction about the health risks of elevated stress, cause of high stress, and healthy ways to reduce stress.   Depression: - Provides group verbal and written instruction on the correlation between heart/lung disease and depressed mood, treatment options, and the stigmas associated with seeking treatment.   Anatomy & Physiology of the Heart: - Group verbal and written instruction and models provide basic cardiac anatomy and physiology, with the coronary electrical and arterial systems. Review of: AMI, Angina, Valve disease, Heart Failure, Cardiac Arrhythmia, Pacemakers, and the ICD.   Cardiac Procedures: - Group verbal and written instruction to review commonly prescribed medications for heart disease. Reviews the medication, class of the drug, and side effects. Includes the steps to properly store meds and maintain the prescription regimen. (beta blockers and nitrates)   Cardiac Medications I: - Group verbal and written instruction to review commonly prescribed medications for heart disease. Reviews the medication, class of the drug, and side effects. Includes the steps to properly store meds and maintain the prescription regimen.   Cardiac Medications II: -Group verbal and written instruction to review commonly prescribed medications for heart disease. Reviews the medication, class of the drug, and side effects. (all other drug classes)    Go Sex-Intimacy & Heart Disease, Get SMART - Goal Setting: - Group verbal and written instruction through game format to discuss heart disease and the return to sexual intimacy.  Provides group verbal and written material to discuss and apply goal setting through the application of the S.M.A.R.T. Method.   Other Matters of the Heart: - Provides group verbal, written materials and models to describe Heart Failure, Angina, Valve Disease, Peripheral Artery Disease, and Diabetes in the realm of heart disease. Includes description of the disease process and treatment options available to the cardiac patient.   Exercise & Equipment Safety: - Individual verbal instruction and demonstration of equipment use and safety with use of the equipment.   [facility]  Date  08/18/17  [facility]  Instruction Review Code  1- Verbalizes Understanding      Infection Prevention: - Provides verbal and written material to individual with discussion of infection control including proper hand washing and proper equipment cleaning during exercise session.   [facility]  Date  08/18/17  Educator  [facility]  Instruction Review Code  1- Verbalizes Understanding      Falls Prevention: - Provides verbal and written material to individual with discussion of falls prevention and safety.   [facility]  Date  08/18/17  [facility]  [facility]  Instruction Review Code  1- Verbalizes Understanding      Diabetes: - Individual verbal and written instruction to review signs/symptoms of diabetes, desired ranges of glucose level fasting, after meals and with exercise. Acknowledge that pre and post exercise glucose checks will be done for 3 sessions at entry of program.   [facility]  Date  08/18/17  Educator  [facility]  Instruction Review Code  1- Verbalizes Understanding      Other: -Provides group and verbal instruction on various topics (see comments)    Knowledge Questionnaire Score: Knowledge  Questionnaire Score - 08/18/17 1457      Knowledge Questionnaire Score   Pre Score  12/28 correct answers reviewed with [name]    correct answers reviewed with [name]       Core Components/Risk Factors/Patient Goals at Admission: Personal Goals and Risk Factors at Admission - 08/18/17 1447      Core Components/Risk Factors/Patient Goals on Admission    Weight Management  Yes;Weight Loss    Intervention  Weight Management: Develop a combined nutrition and exercise program designed to reach desired caloric intake, while maintaining appropriate intake of nutrient and fiber, sodium and fats, and appropriate energy expenditure required for the weight goal.;Weight Management: Provide education and appropriate resources to help participant work on and attain dietary goals.;Weight Management/Obesity: Establish reasonable short term and long term weight goals.    Admit Weight  239 lb 12.8 oz (108.8 kg)    Goal Weight: Short Term  234 lb (106.1 kg)    Goal Weight: Long Term  200 lb (90.7 kg)    Expected Outcomes  Short Term: Continue to assess and modify interventions until short term weight is achieved;Long Term: Adherence to nutrition and physical activity/exercise program aimed toward attainment of established weight goal;Weight Maintenance: Understanding of the daily nutrition guidelines, which includes 25-35% calories from fat, 7% or less cal from saturated fats, less than 200mg  cholesterol, less than 1.5gm of sodium, & 5 or more servings of fruits and vegetables daily;Weight Loss: Understanding of general recommendations for a balanced deficit meal plan, which promotes 1-2 lb weight loss per week and includes a negative energy balance of [phone] kcal/d;Understanding recommendations for meals to include 15-35% energy as protein, 25-35% energy from fat, 35-60% energy from carbohydrates, less than 200mg  of dietary cholesterol, 20-35 gm of total fiber daily;Understanding of distribution of calorie intake  throughout the day with the consumption of 4-5 meals/snacks    Diabetes  Yes    Intervention  Provide education about signs/symptoms and action to take for hypo/hyperglycemia.;Provide education about proper nutrition, including hydration, and aerobic/resistive exercise prescription along with prescribed medications to achieve blood glucose in normal ranges: Fasting glucose 65-99 mg/dL    Expected Outcomes  Long Term: Attainment of HbA1C < 7%.;Short Term: Participant verbalizes understanding of the signs/symptoms and immediate care of hyper/hypoglycemia, proper foot care and importance of medication, aerobic/resistive exercise and nutrition plan for blood glucose control.    Hypertension  Yes    Intervention  Provide education on lifestyle modifcations including regular physical activity/exercise, weight management, moderate sodium restriction and increased consumption of fresh fruit, vegetables, and low fat dairy, alcohol moderation, and smoking cessation.;Monitor prescription use compliance.    Expected Outcomes  Short Term: Continued assessment and intervention until BP is < 140/47mm HG in hypertensive participants. < 130/76mm HG in hypertensive participants with diabetes, heart failure or chronic kidney disease.;Long Term: Maintenance of blood pressure at goal levels.    Stress  Yes [name] has a lot of financial stress related to his disability being under investigation. Financially, his family is struggling even with his disabily money and is having issues getting medication.    [name] has a lot of financial stress related to his disability being under investigation. Financially, his family is struggling even with his disabily money and is having issues getting medication.  Intervention  Offer individual and/or small group education and counseling on adjustment to heart disease, stress management and health-related lifestyle change. Teach and support self-help strategies.;Refer participants experiencing  significant psychosocial distress to appropriate mental health specialists for further evaluation and treatment. When possible, include family members and significant others in education/counseling sessions.    Expected Outcomes  Short Term: Participant demonstrates changes in health-related behavior, relaxation and other stress management skills, ability to obtain effective social support, and compliance with psychotropic medications if prescribed.;Long Term: Emotional wellbeing is indicated by absence of clinically significant psychosocial distress or social isolation.       Core Components/Risk Factors/Patient Goals Review:    Core Components/Risk Factors/Patient Goals at Discharge (Final Review):    ITP Comments: ITP Comments    Row Name 08/18/17 1444           ITP Comments  Med Review Completed. Initial ITP created. Diagnosis can be found in [facility] 07/18/17          Comments: Initial ITP

## 2017-08-21 NOTE — Progress Notes (Signed)
Cardiology Office Note  Date:  08/22/2017   ID:  [name], DOB 1981-04-27, MRN [id number]  PCP:  Marisue IvanLinthavong, Kanhka, MD   Chief Complaint  Patient presents with  . other    Follow up from Stent placement. Patient c/o Chest pain.  Meds reviewed verbally with patient.     HPI:  36 yo with h/o  premature CAD s/p CABG and PCI,  smoking history,  continued poorly controlled diabetes,  stress test in 2011 showing no ischemia,  last catheterization in 2010 showing patent grafts  September 2012 for chest pain,  Previous history of medication noncompliance stress test September 2014 showing no ischemia who presents for routine followup of his coronary artery disease.  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  He is not taking the medicine as it is expensive and he  does not have insurance for medications On [facility] he does not have a statin No refills on losartan or metoprolol.  He is currently not on these having difficulty affording his insulin  Previously seen by endocrinology Denies any chest pain with exertion concerning for angina.  EKG on today's visit shows normal sinus rhythm with rate 105 bpm, nonspecific ST abnormality, no change compared to previous EKGs  Other past medical history Previous Hemoglobin A1c typically more than 13   seen in the emergency room 11/21/2013 for chronic chest pain.  Cardiac enzymes negative x2. No further workup at that time Seen in clinic in followup and felt it was atypical in nature  Chest  pain in 10/11 and went to [facility] where he had a  stress test showing basal to mid inferolateral scar and a small area of scar in the mid anteroseptum.  There was no ischemia.  PMH:   has a past medical history of Coronary atherosclerosis of artery bypass graft, HFrEF (heart failure with reduced ejection fraction) (HCC), HLD (hyperlipidemia), HTN (hypertension), TIA (transient ischemic attack) (2009), Ischemic cardiomyopathy, Kidney stone, Noncompliance, Obesity, unspecified, and Uncontrolled diabetes mellitus with complications (HCC).  PSH:    Past Surgical History:  Procedure Laterality Date  . CORONARY ARTERY BYPASS GRAFT  2007   x 4 (age 36)    Current Outpatient Medications  Medication Sig Dispense Refill  . aspirin EC 81 MG tablet Take 81 mg by mouth daily.    Marland Kitchen. glucose blood test strip Use as instructed 100 each 12  . insulin aspart protamine- aspart (NOVOLOG MIX 70/30) (70-30) 100 UNIT/ML injection Inject 15 Units into the skin 3 (three) times daily.    Marland Kitchen. losartan (COZAAR) 25 MG tablet Take 1 tablet (25 mg total) at bedtime by mouth. 90 tablet 4  . metoprolol tartrate (LOPRESSOR) 25 MG tablet Take 1 tablet (25 mg total) 2 (two) times daily by mouth. 180 tablet 4  . Multiple Vitamin (MULTIVITAMIN) tablet Take 2 tablets by mouth daily.     . ticagrelor (BRILINTA) 90 MG TABS tablet Take 1 tablet (90 mg total) by mouth 2 (two) times daily. 60 tablet 2   No current facility-administered medications for this visit.      Allergies:   Nitroglycerin   Social History:  The patient  reports  that he quit smoking about 12 years ago. His smoking use included cigarettes. He has a 28.00 pack-year smoking history. He has quit using smokeless tobacco. His smokeless tobacco use included chew and snuff. He reports that he drinks about 1.2 oz of alcohol per week. He reports that he does not use drugs.   Family History:   family history includes COPD in his father; Coronary artery disease in his maternal grandfather and unknown relative; Diabetes in  his maternal grandfather; Emphysema in his father; Heart attack in his maternal grandfather; Heart disease in his maternal grandfather; Hyperlipidemia in his maternal grandfather; Hypertension in his maternal grandfather; Lung cancer in his unknown relative.    Review of Systems: Review of Systems  Constitutional: Negative.   Respiratory: Negative.   Cardiovascular: Negative.   Gastrointestinal: Negative.   Musculoskeletal: Negative.   Neurological: Negative.   Psychiatric/Behavioral: Negative.   All other systems reviewed and are negative.    PHYSICAL EXAM: VS:  BP 140/76 (BP Location: Left Arm, Patient Position: Sitting, Cuff Size: Normal)   Pulse (!) 105   Ht 5\' 11"  (1.803 m)   Wt 239 lb 12 oz (108.7 kg)   BMI 33.44 kg/m  , BMI Body mass index is 33.44 kg/m.  GEN: Well nourished, well developed, in no acute distress  HEENT: normal  Neck: no JVD, carotid bruits, or masses Cardiac: RRR; no murmurs, rubs, or gallops,no edema  Respiratory:  clear to auscultation bilaterally, normal work of breathing GI: soft, nontender, nondistended, + BS MS: no deformity or atrophy  Skin: warm and dry, no rash Neuro:  Strength and sensation are intact Psych: euthymic mood, full affect    Recent Labs: 07/18/2017: ALT 27 07/19/2017: BUN 16; Creatinine, Ser 0.81; Hemoglobin 12.3; Platelets 327; Potassium 4.1; Sodium 138    Lipid Panel Lab Results  Component Value Date   CHOL 217 (H) 07/11/2017   HDL 24 (L) 07/11/2017   LDLCALC 159 (H) 07/11/2017   TRIG 170 (H) 07/11/2017      Wt Readings from Last 3 Encounters:  08/22/17 239 lb 12 oz (108.7 kg)  08/18/17 239 lb 12.8 oz (108.8 kg)  07/18/17 220 lb (99.8 kg)       ASSESSMENT AND PLAN:  Atherosclerosis of coronary artery bypass graft of native heart with stable angina pectoris (HCC) We have refilled his beta-blocker, losartan, Lipitor Unable to afford the Crestor and total cholesterol well above goal Discussed recent cardiac  catheterization results with him Helped him fill out assistance paperwork for brilinta, samples provided Discussed changing to Plavix if he is unable to afford brilinta  Essential hypertension We have renewed his metoprolol and losartan  Mixed hyperlipidemia Not on Crestor for some reason, not even on his list We have sent in Lipitor 80 mg daily.  He denies any history of myalgias.  Likely will be more affordable for him Uncontrolled type 2 diabetes mellitus with complication, with long-term current use of insulin (HCC)  unable to afford his insulin Hemoglobin A1c typically elevated Managed by primary care  Chest pain, unspecified type Recent PTCA and stent placement Denies any further chest pain symptoms Long discussion concerning recent cardiac catheterization.  He is concerned about PTCA without stent placement of ostial left circumflex   Total encounter time more than 25 minutes  Greater than 50% was spent in counseling and coordination of care with the patient   Disposition:   F/U  6 months  No orders of the defined types were placed in this encounter.  Signed, Dossie Arbour, M.D., Ph.D. 08/22/2017  [facility] [place], [place] [phone]

## 2017-08-22 ENCOUNTER — Ambulatory Visit: Payer: Medicare Other | Admitting: Cardiovascular Disease

## 2017-08-22 ENCOUNTER — Encounter: Payer: Self-pay | Admitting: Cardiovascular Disease

## 2017-08-22 ENCOUNTER — Encounter: Payer: Medicare Other | Admitting: *Deleted

## 2017-08-22 ENCOUNTER — Ambulatory Visit (INDEPENDENT_AMBULATORY_CARE_PROVIDER_SITE_OTHER): Payer: Medicare Other | Admitting: Cardiovascular Disease

## 2017-08-22 VITALS — BP 140/76 | HR 105 | Ht 71.0 in | Wt 239.8 lb

## 2017-08-22 DIAGNOSIS — Z955 Presence of coronary angioplasty implant and graft: Secondary | ICD-10-CM | POA: Diagnosis not present

## 2017-08-22 DIAGNOSIS — E1165 Type 2 diabetes mellitus with hyperglycemia: Secondary | ICD-10-CM

## 2017-08-22 DIAGNOSIS — R079 Chest pain, unspecified: Secondary | ICD-10-CM | POA: Diagnosis not present

## 2017-08-22 DIAGNOSIS — E782 Mixed hyperlipidemia: Secondary | ICD-10-CM

## 2017-08-22 DIAGNOSIS — E118 Type 2 diabetes mellitus with unspecified complications: Secondary | ICD-10-CM

## 2017-08-22 DIAGNOSIS — I209 Angina pectoris, unspecified: Secondary | ICD-10-CM | POA: Diagnosis not present

## 2017-08-22 DIAGNOSIS — I25708 Atherosclerosis of coronary artery bypass graft(s), unspecified, with other forms of angina pectoris: Secondary | ICD-10-CM | POA: Diagnosis not present

## 2017-08-22 DIAGNOSIS — I10 Essential (primary) hypertension: Secondary | ICD-10-CM | POA: Diagnosis not present

## 2017-08-22 DIAGNOSIS — I213 ST elevation (STEMI) myocardial infarction of unspecified site: Secondary | ICD-10-CM

## 2017-08-22 DIAGNOSIS — IMO0002 Reserved for concepts with insufficient information to code with codable children: Secondary | ICD-10-CM

## 2017-08-22 LAB — GLUCOSE, CAPILLARY
GLUCOSE-CAPILLARY: 158 mg/dL — AB (ref 65–99)
Glucose-Capillary: 250 mg/dL — ABNORMAL HIGH (ref 65–99)

## 2017-08-22 MED ORDER — METOPROLOL TARTRATE 25 MG PO TABS: 25.0000 mg | ORAL_TABLET | Freq: Two times a day (BID) | ORAL | 4 refills | Status: AC

## 2017-08-22 MED ORDER — TICAGRELOR 90 MG PO TABS: 90.0000 mg | ORAL_TABLET | Freq: Two times a day (BID) | ORAL | 11 refills | Status: DC

## 2017-08-22 MED ORDER — LOSARTAN POTASSIUM 25 MG PO TABS: 25.0000 mg | ORAL_TABLET | Freq: Every day | ORAL | 4 refills | Status: AC

## 2017-08-22 MED ORDER — ATORVASTATIN CALCIUM 80 MG PO TABS: 80.0000 mg | ORAL_TABLET | Freq: Every day | ORAL | 3 refills | Status: AC

## 2017-08-22 NOTE — Patient Instructions (Addendum)
Medication Instructions:   No medication changes made Medication Samples have been provided to the patient.  Drug name: Brilinta       Strength: 90 mg        Qty: 2 bottles  LOT: [id number]  Exp.Date: 03/21   Labwork:  No new labs needed  Testing/Procedures:  No further testing at this time   Follow-Up: It was a pleasure seeing you in the office today. Please call us if you have new issues that need to be addressed before your next appt.  [phone]  Your physician wants you to follow-up in: 6 months.  You will receive a reminder letter in the mail two months in advance. If you don't receive a letter, please call our office to schedule the follow-up appointment.  If you need a refill on your cardiac medications before your next appointment, please call your pharmacy.

## 2017-08-22 NOTE — Telephone Encounter (Signed)
Patient in today to see Dr. Mariah MillingGollan and provided with assistance forms x 2 for his Brilinta assistance. Patient given samples as well at visit.

## 2017-08-22 NOTE — Progress Notes (Signed)
Daily Session Note  Patient Details  Name: [name] MRN: [id number] Date of Birth: 1981/06/23 Referring Provider:     Cardiac Rehab from 08/18/2017 in [facility] Cardiac and Pulmonary Rehab  Referring Provider  Gollan      Encounter Date: 08/22/2017  Check In: Session Check In - 08/22/17 0745      Check-In   Location  [facility]-Cardiac & Pulmonary Rehab    Staff Present  Gerlene Burdock, RN, Levie Heritage, MA, ACSM RCEP, Exercise Physiologist;Kelly Amedeo Plenty, BS, ACSM CEP, Exercise Physiologist    Supervising physician immediately available to respond to emergencies  See telemetry face sheet for immediately available ER MD    Medication changes reported      No    Fall or balance concerns reported     No    Warm-up and Cool-down  Performed on first and last piece of equipment    Resistance Training Performed  Yes    VAD Patient?  No      Pain Assessment   Currently in Pain?  No/denies          Social History   Tobacco Use  Smoking Status Former Smoker  . Packs/day: 2.00  . Years: 14.00  . Pack years: 28.00  . Types: Cigarettes  . Last attempt to quit: 10/11/2004  . Years since quitting: 12.8  Smokeless Tobacco Former Systems developer  . Types: Chew, Snuff  Tobacco Comment   used to smoke 1 1/2 ppd, quit 2006    Goals Met:  Exercise tolerated well Personal goals reviewed No report of cardiac concerns or symptoms Strength training completed today  Goals Unmet:  Not Applicable  Comments: First full day of exercise!  Patient was oriented to gym and equipment including functions, settings, policies, and procedures.  Patient's individual exercise prescription and treatment plan were reviewed.  All starting workloads were established based on the results of the 6 minute walk test done at initial orientation visit.  The plan for exercise progression was also introduced and progression will be customized based on patient's performance and goals.    Dr. Emily Filbert is Medical  Director for [facility] and [facility].

## 2017-08-24 DIAGNOSIS — Z955 Presence of coronary angioplasty implant and graft: Secondary | ICD-10-CM | POA: Diagnosis not present

## 2017-08-24 DIAGNOSIS — I213 ST elevation (STEMI) myocardial infarction of unspecified site: Secondary | ICD-10-CM

## 2017-08-24 LAB — GLUCOSE, CAPILLARY
Glucose-Capillary: 336 mg/dL — ABNORMAL HIGH (ref 65–99)
Glucose-Capillary: 357 mg/dL — ABNORMAL HIGH (ref 65–99)

## 2017-08-24 NOTE — Progress Notes (Signed)
Incomplete Session Note  Patient Details  Name: [name] L Glance MRN: [id number] Date of Birth: 28-Jun-1981 Referring Provider:     Cardiac Rehab from 08[facility]18/2017 in [facility]iac [facility]  Referring Provider  Richardean ChimeraGollan      Ferdinand L Trimmer did not complete his rehab session.  Adams blood sugar was 336 and after recheck he was 357. He stayed for education but did not exercise.  Reviewed home exercise with pt today.  Pt plans to walk 2 extra days at home for exercise.  Reviewed THR, pulse, RPE, sign and symptoms, NTG use, and when to call 911 or MD.  Also discussed weather considerations and indoor options.  Pt voiced understanding. Looking into getting him new shoes and help with his medications through the foundation.

## 2017-08-24 NOTE — Progress Notes (Deleted)
Daily Session Note  Patient Details  Name: [name] MRN: [id number] Date of Birth: Aug 12, 1981 Referring Provider:     Cardiac Rehab from 08/18/2017 in [facility] Cardiac and Pulmonary Rehab  Referring Provider  Gollan      Encounter Date: 08/24/2017  Check In: Session Check In - 08/24/17 0732      Check-In   Location  [facility]-Cardiac & Pulmonary Rehab    Staff Present  Darel Hong, RN BSN;Joseph Darrin Nipper, [place], ACSM RCEP, Exercise Physiologist    Supervising physician immediately available to respond to emergencies  See telemetry face sheet for immediately available ER MD    Medication changes reported      No    Fall or balance concerns reported     No    Warm-up and Cool-down  Performed on first and last piece of equipment    Resistance Training Performed  Yes    VAD Patient?  No      Pain Assessment   Currently in Pain?  No/denies        Exercise Prescription Changes - 08/23/17 1400      Response to Exercise   Blood Pressure (Admit)  114/66    Blood Pressure (Exercise)  134/74    Blood Pressure (Exit)  138/82    Heart Rate (Admit)  60 bpm    Heart Rate (Exercise)  124 bpm    Heart Rate (Exit)  89 bpm    Rating of Perceived Exertion (Exercise)  18    Symptoms  fatigue, chest soreness 4/10     Comments  first full day of exercise    Duration  Progress to 45 minutes of aerobic exercise without signs/symptoms of physical distress    Intensity  THRR unchanged      Progression   Progression  Continue to progress workloads to maintain intensity without signs/symptoms of physical distress.    Average METs  3.11      Resistance Training   Training Prescription  Yes    Weight  -- with [name] during weights today      Interval Training   Interval Training  No      Treadmill   MPH  2.5    Grade  2    Minutes  15    METs  3.6      Recumbant Bike   Level  3    Watts  22    Minutes  15    METs  2.63       Social History   Tobacco Use   Smoking Status Former Smoker  . Packs/day: 2.00  . Years: 14.00  . Pack years: 28.00  . Types: Cigarettes  . Last attempt to quit: 10/11/2004  . Years since quitting: 12.8  Smokeless Tobacco Former Systems developer  . Types: Chew, Snuff  Tobacco Comment   used to smoke 1 1/2 ppd, quit 2006    Goals Met:  Exercise tolerated well No report of cardiac concerns or symptoms Strength training completed today  Goals Unmet:  Not Applicable  Comments: Pt able to follow exercise prescription today without complaint.  Will continue to monitor for progression.   Dr. Emily Filbert is Medical Director for [place] and [facility].

## 2017-08-25 NOTE — Addendum Note (Signed)
Addended by: Festus AloeRESPO, Akiem Urieta G on: 08/25/2017 11:54 AM   Modules accepted: Orders

## 2017-08-26 ENCOUNTER — Encounter: Payer: Medicare Other | Admitting: *Deleted

## 2017-08-26 DIAGNOSIS — I213 ST elevation (STEMI) myocardial infarction of unspecified site: Secondary | ICD-10-CM

## 2017-08-26 DIAGNOSIS — Z955 Presence of coronary angioplasty implant and graft: Secondary | ICD-10-CM | POA: Diagnosis not present

## 2017-08-26 LAB — GLUCOSE, CAPILLARY
GLUCOSE-CAPILLARY: 276 mg/dL — AB (ref 65–99)
Glucose-Capillary: 285 mg/dL — ABNORMAL HIGH (ref 65–99)

## 2017-08-26 NOTE — Progress Notes (Signed)
Daily Session Note  Patient Details  Name: [name] MRN: [id number] Date of Birth: 1981/05/05 Referring Provider:     Cardiac Rehab from 08/18/2017 in [facility] Cardiac and Pulmonary Rehab  Referring Provider  Gollan      Encounter Date: 08/26/2017  Check In: Session Check In - 08/26/17 1022      Check-In   Location  [facility]-Cardiac & Pulmonary Rehab    Staff Present  Nada Maclachlan, BA, ACSM CEP, Exercise Physiologist;[place] Savini Luan Pulling, MA, ACSM RCEP, Exercise Physiologist;Carroll Enterkin, RN, BSN    Supervising physician immediately available to respond to emergencies  See telemetry face sheet for immediately available ER MD    Medication changes reported      No    Fall or balance concerns reported     No    Warm-up and Cool-down  Performed on first and last piece of equipment    Resistance Training Performed  No blood pressure dropped at end of exercise today    VAD Patient?  No      Pain Assessment   Currently in Pain?  No/denies          Social History   Tobacco Use  Smoking Status Former Smoker  . Packs/day: 2.00  . Years: 14.00  . Pack years: 28.00  . Types: Cigarettes  . Last attempt to quit: 10/11/2004  . Years since quitting: 12.8  Smokeless Tobacco Former Systems developer  . Types: Chew, Snuff  Tobacco Comment   used to smoke 1 1/2 ppd, quit 2006    Goals Met:  Independence with exercise equipment Exercise tolerated well Strength training completed today  Goals Unmet:  Not Applicable  Comments: Pt able to follow exercise prescription today without complaint.  Will continue to monitor for progression. After exercise was over, [name] felt weak and lightheaded.  Blood sugar was 285 but his blood pressure had dropped a little in the 100s.  He was given water and rested.  His blood pressure was 118/60 at discharge.  Also talked to [name] today about the Medication Management clinic to get help to pay for his medicaitons. He will get in contact with them.  We are also working  the [employer] to fund his new shoes that he got fitted for.    Dr. Emily Filbert is Medical Director for [place] and [facility].

## 2017-08-29 ENCOUNTER — Encounter: Payer: Medicare Other | Admitting: *Deleted

## 2017-08-29 DIAGNOSIS — Z955 Presence of coronary angioplasty implant and graft: Secondary | ICD-10-CM | POA: Diagnosis not present

## 2017-08-29 DIAGNOSIS — I213 ST elevation (STEMI) myocardial infarction of unspecified site: Secondary | ICD-10-CM

## 2017-08-29 LAB — GLUCOSE, CAPILLARY
GLUCOSE-CAPILLARY: 297 mg/dL — AB (ref 65–99)
GLUCOSE-CAPILLARY: 233 mg/dL — AB (ref 65–99)

## 2017-08-29 NOTE — Progress Notes (Signed)
Daily Session Note  Patient Details  Name: [name] MRN: [id number] Date of Birth: 1981-08-06 Referring Provider:     Cardiac Rehab from 08/18/2017 in [facility] Cardiac and Pulmonary Rehab  Referring Provider  Gollan      Encounter Date: 08/29/2017  Check In: Session Check In - 08/29/17 0817      Check-In   Location  [facility]Cardiac & Pulmonary Rehab    Staff Present  Earlean Shawl, BS, ACSM CEP, Exercise Physiologist;Carroll Enterkin, RN, Levie Heritage, MA, ACSM RCEP, Exercise Physiologist    Supervising physician immediately available to respond to emergencies  See telemetry face sheet for immediately available ER MD    Medication changes reported      No    Fall or balance concerns reported     No    Warm-up and Cool-down  Performed on first and last piece of equipment    Resistance Training Performed  Yes    VAD Patient?  No      Pain Assessment   Currently in Pain?  No/denies    Multiple Pain Sites  No          Social History   Tobacco Use  Smoking Status Former Smoker  . Packs/day: 2.00  . Years: 14.00  . Pack years: 28.00  . Types: Cigarettes  . Last attempt to quit: 10/11/2004  . Years since quitting: 12.8  Smokeless Tobacco Former Systems developer  . Types: Chew, Snuff  Tobacco Comment   used to smoke 1 1/2 ppd, quit 2006    Goals Met:  Independence with exercise equipment Exercise tolerated well No report of cardiac concerns or symptoms Strength training completed today  Goals Unmet:  Not Applicable  Comments: Pt able to follow exercise prescription today without complaint.  Will continue to monitor for progression.    Dr. Emily Filbert is Medical Director for [place] and [facility].

## 2017-09-07 DIAGNOSIS — I213 ST elevation (STEMI) myocardial infarction of unspecified site: Secondary | ICD-10-CM

## 2017-09-07 DIAGNOSIS — Z955 Presence of coronary angioplasty implant and graft: Secondary | ICD-10-CM | POA: Diagnosis not present

## 2017-09-07 LAB — GLUCOSE, CAPILLARY
Glucose-Capillary: 142 mg/dL — ABNORMAL HIGH (ref 65–99)
Glucose-Capillary: 130 mg/dL — ABNORMAL HIGH (ref 65–99)

## 2017-09-07 NOTE — Progress Notes (Signed)
Daily Session Note  Patient Details  Name: [name] MRN: [id number] Date of Birth: 28-Jul-1981 Referring Provider:     Cardiac Rehab from 08/18/2017 in [facility] Cardiac and Pulmonary Rehab  Referring Provider  Gollan      Encounter Date: 09/07/2017  Check In: Session Check In - 09/07/17 0746      Check-In   Location  [facility]-Cardiac & Pulmonary Rehab    Staff Present  Justin Mend RCP,RRT,BSRT;Heath Lark, RN, BSN, CCRP;Jessica Luan Pulling, MA, ACSM RCEP, Exercise Physiologist    Supervising physician immediately available to respond to emergencies  See telemetry face sheet for immediately available ER MD    Medication changes reported      No    Fall or balance concerns reported     No    Warm-up and Cool-down  Performed on first and last piece of equipment    Resistance Training Performed  Yes    VAD Patient?  No      Pain Assessment   Currently in Pain?  No/denies          Social History   Tobacco Use  Smoking Status Former Smoker  . Packs/day: 2.00  . Years: 14.00  . Pack years: 28.00  . Types: Cigarettes  . Last attempt to quit: 10/11/2004  . Years since quitting: 12.9  Smokeless Tobacco Former Systems developer  . Types: Chew, Snuff  Tobacco Comment   used to smoke 1 1/2 ppd, quit 2006    Goals Met:  Independence with exercise equipment Exercise tolerated well No report of cardiac concerns or symptoms Strength training completed today  Goals Unmet:  Not Applicable  Comments: Pt able to follow exercise prescription today without complaint.  Will continue to monitor for progression. See ITP for goal review.  Dr. Emily Filbert is Medical Director for [place] and [facility].

## 2017-09-12 ENCOUNTER — Encounter: Payer: Medicare Other | Attending: Cardiovascular Disease

## 2017-09-12 DIAGNOSIS — Z955 Presence of coronary angioplasty implant and graft: Secondary | ICD-10-CM | POA: Insufficient documentation

## 2017-09-12 DIAGNOSIS — I2129 ST elevation (STEMI) myocardial infarction involving other sites: Secondary | ICD-10-CM | POA: Insufficient documentation

## 2017-09-14 ENCOUNTER — Telehealth: Payer: Self-pay | Admitting: *Deleted

## 2017-09-14 ENCOUNTER — Encounter: Payer: Self-pay | Admitting: *Deleted

## 2017-09-14 DIAGNOSIS — Z955 Presence of coronary angioplasty implant and graft: Secondary | ICD-10-CM

## 2017-09-14 DIAGNOSIS — I213 ST elevation (STEMI) myocardial infarction of unspecified site: Secondary | ICD-10-CM

## 2017-09-14 NOTE — Progress Notes (Signed)
Cardiac Individual Treatment Plan  Patient Details  Name: [name] MRN: [id number] Date of Birth: Oct 15, 1980 Referring Provider:     [facility]  Referring Provider  Gollan      Initial Encounter Date:    [facility]  Date  08/18/17  Referring Provider  Mariah MillingGollan      Visit Diagnosis: ST elevation myocardial infarction (STEMI), unspecified artery [facility](HCC)  Status post coronary artery stent placement  Patient's Home Medications on Admission:  Current Outpatient Medications:  .  aspirin EC 81 MG tablet, Take 81 mg by mouth daily., Disp: , Rfl:  .  atorvastatin (LIPITOR) 80 MG tablet, Take 1 tablet (80 mg total) daily by mouth., Disp: 90 tablet, Rfl: 3 .  glucose blood test strip, Use as instructed, Disp: 100 each, Rfl: 12 .  insulin aspart protamine- aspart (NOVOLOG MIX 70/30) (70-30) 100 UNIT/ML injection, Inject 15 Units into the skin 3 (three) times daily., Disp: , Rfl:  .  losartan (COZAAR) 25 MG tablet, Take 1 tablet (25 mg total) at bedtime by mouth., Disp: 90 tablet, Rfl: 4 .  metoprolol tartrate (LOPRESSOR) 25 MG tablet, Take 1 tablet (25 mg total) 2 (two) times daily by mouth., Disp: 180 tablet, Rfl: 4 .  Multiple Vitamin (MULTIVITAMIN) tablet, Take 2 tablets by mouth daily. , Disp: , Rfl:  .  ticagrelor (BRILINTA) 90 MG TABS tablet, Take 1 tablet (90 mg total) 2 (two) times daily by mouth., Disp: 60 tablet, Rfl: 11  Past Medical History: Past Medical History:  Diagnosis Date  . Coronary atherosclerosis of artery bypass graft    a. 10/2006 s/p PCI/stenting of LCX;  b. 02/2007 s/p CABG x 4 @ age 36 (LIMA->LAD, RIMA->Diag, VG->OM, VG->RPDA;  c. 06/2017 Lateral STEMI/PCI: LM 50, LAD 100ost, 4563m, RI small, LCX 95ost/p (PTCA - 2.605mm balloon), 70p/m ISR, OM2 75, RCA 100p/m, VG->RPDA 25, VG->OM2 80ost (3.0x15 Sierra DES), RIMA->Diag 100, LIMA->LAD nl.  . HFrEF (heart failure  with reduced ejection fraction) (HCC)    a. 07/2017 Echo: EF 35-40%  . HLD (hyperlipidemia)    poorly controlled  . HTN (hypertension)   . Hx-TIA (transient ischemic attack) 2009   possible-([facility]) Normal MRI of brain and MRA of the head and neck  . Ischemic cardiomyopathy    a. 07/2017 Echo: EF 35-40%, apicalanteroseptal and apical AK, GR2 DD, mildly to mod reduced RV fxn.  . Kidney stone   . Noncompliance   . Obesity, unspecified   . Uncontrolled diabetes mellitus with complications (HCC)    a. Dx 2002 - poorly controlled in setting of noncompliance;  b. 07/2017 HbA1c 12.6.    Tobacco Use: Social History   Tobacco Use  Smoking Status Former Smoker  . Packs/day: 2.00  . Years: 14.00  . Pack years: 28.00  . Types: Cigarettes  . Last attempt to quit: 10/11/2004  . Years since quitting: 12.9  Smokeless Tobacco Former NeurosurgeonUser  . Types: Chew, Snuff  Tobacco Comment   used to smoke 1 1/2 ppd, quit 2006    Labs: Recent Review Flowsheet Data    Labs for ITP Cardiac and Pulmonary Rehab Latest Ref Rng & Units 05/30/2015 06/02/2015 09/29/2015 07/10/2017 07/11/2017   Cholestrol 0 - 200 mg/dL - - - 409(W253(H) 119(J217(H)   LDLCALC 0 - 99 mg/dL - - - 478(G190(H) 956(O159(H)   LDLDIRECT mg/dL - - - - -   HDL >13>40 mg/dL - - -  33(L) 24(L)   Trlycerides <150 mg/dL - - - 161 096(E)   Hemoglobin A1c 4.8 - 5.6 % - 12.0(H) 11.4(H) - 12.6(H)   PHART 7.350 - 7.450 - - - - -   PCO2ART 35.0 - 45.0 mmHg - - - - -   HCO3 21.0 - 28.0 mEq/L 24.3 - - - -   TCO2 0 - 100 mmol/L - - - - -   ACIDBASEDEF 0.0 - 2.0 mmol/L 1.3 - - - -   O2SAT % - - - - -       Exercise Target Goals:    Exercise Program Goal: Individual exercise prescription set with THRR, safety & activity barriers. Participant demonstrates ability to understand and report RPE using BORG scale, to self-measure pulse accurately, and to acknowledge the importance of the exercise prescription.  Exercise Prescription Goal: Starting with aerobic activity 30 plus  minutes a day, 3 days per week for initial exercise prescription. Provide home exercise prescription and guidelines that participant acknowledges understanding prior to discharge.  Activity Barriers & Risk Stratification: Activity Barriers & Cardiac Risk Stratification - 08/18/17 1459      Activity Barriers & Cardiac Risk Stratification   Activity Barriers  Back Problems;Chest Pain/Angina;Shortness of Breath    Cardiac Risk Stratification  High       6 Minute Walk: 6 Minute Walk    Row Name 08/18/17 1417         6 Minute Walk   Distance  1225 feet     Walk Time  6 minutes     # of Rest Breaks  0     MPH  2.32     METS  4.23     RPE  11     Perceived Dyspnea   2     VO2 Peak  14.8     Symptoms  No     Resting HR  77 bpm     Resting BP  112/66     Resting Oxygen Saturation   100 %     Exercise Oxygen Saturation  during 6 min walk  100 %     Max Ex. HR  101 bpm     Max Ex. BP  124/66     2 Minute Post BP  114/66        Oxygen Initial Assessment:   Oxygen Re-Evaluation:   Oxygen Discharge (Final Oxygen Re-Evaluation):   Initial Exercise Prescription: Initial Exercise Prescription - 08/18/17 1400      Date of Initial Exercise RX and Referring Provider   Date  08/18/17    Referring Provider  Gollan      Treadmill   MPH  2.5    Grade  2    Minutes  15    METs  3.6      Recumbant Bike   Level  3    RPM  60    Watts  70    Minutes  15    METs  4.2      REL-XR   Level  3    Watts  70    Speed  50    Minutes  15    METs  4.2      Prescription Details   Frequency (times per week)  3    Duration  Progress to 45 minutes of aerobic exercise without signs/symptoms of physical distress      Intensity   THRR 40-80% of Max Heartrate  119-161  Ratings of Perceived Exertion  11-13    Perceived Dyspnea  0-4      Resistance Training   Training Prescription  Yes    Weight  4 lbs    Reps  10-15       Perform Capillary Blood Glucose checks as  needed.  Exercise Prescription Changes: Exercise Prescription Changes    Row Name 08/18/17 1400 08/23/17 1400 08/24/17 0800 09/08/17 1300       Response to Exercise   Blood Pressure (Admit)  112/66  114/66  -  134/72    Blood Pressure (Exercise)  124/66  134/74  -  124/70    Blood Pressure (Exit)  114/66  138/82  -  122/74    Heart Rate (Admit)  78 bpm  60 bpm  -  78 bpm    Heart Rate (Exercise)  101 bpm  124 bpm  -  101 bpm    Heart Rate (Exit)  90 bpm  89 bpm  -  77 bpm    Rating of Perceived Exertion (Exercise)  11  18  -  15    Symptoms  -  fatigue, chest soreness 4/10   -  fatigue    Comments  -  first full day of exercise  -  -    Duration  -  Progress to 45 minutes of aerobic exercise without signs/symptoms of physical distress  -  Continue with 45 min of aerobic exercise without signs/symptoms of physical distress.    Intensity  -  THRR unchanged  -  THRR unchanged      Progression   Progression  -  Continue to progress workloads to maintain intensity without signs/symptoms of physical distress.  -  Continue to progress workloads to maintain intensity without signs/symptoms of physical distress.    Average METs  -  3.11  -  3.87      Resistance Training   Training Prescription  -  Yes  -  Yes    Weight  -  - with Olegario Messier during weights today  -  4 lbs    Reps  -  -  -  10-15      Interval Training   Interval Training  -  No  -  No      Treadmill   MPH  -  2.5  -  2.5    Grade  -  2  -  2    Minutes  -  15  -  15    METs  -  3.6  -  3.6      Recumbant Bike   Level  -  3  -  4    Watts  -  22  -  70    Minutes  -  15  -  15    METs  -  2.63  -  4.1      REL-XR   Level  -  -  -  3    Minutes  -  -  -  15    METs  -  -  -  3.9      Home Exercise Plan   Plans to continue exercise at  -  -  Home (comment) walking at home  Home (comment) walking at home    Frequency  -  -  Add 2 additional days to program exercise sessions.  Add 2 additional days to program exercise  sessions.    Initial  Home Exercises Provided  -  -  08/24/17  08/24/17       Exercise Comments: Exercise Comments    Row Name 08/22/17 0746 08/26/17 1025         Exercise Comments  First full day of exercise!  Patient was oriented to gym and equipment including functions, settings, policies, and procedures.  Patient's individual exercise prescription and treatment plan were reviewed.  All starting workloads were established based on the results of the 6 minute walk test done at initial orientation visit.  The plan for exercise progression was also introduced and progression will be customized based on patient's performance and goals.  After exercise was over, [name] felt weak and lightheaded.  Blood sugar was 285 but his blood pressure had dropped a little in the 100s.  He was given water and rested.  His blood pressure was 118/60 at discharge.We are also working the [employer] to fund his new shoes that he got fitted for.          Exercise Goals and Review: Exercise Goals    Row Name 08/18/17 1416             Exercise Goals   Increase Physical Activity  Yes       Intervention  Provide advice, education, support and counseling about physical activity/exercise needs.;Develop an individualized exercise prescription for aerobic and resistive training based on initial evaluation findings, risk stratification, comorbidities and participant's personal goals.       Expected Outcomes  Achievement of increased cardiorespiratory fitness and enhanced flexibility, muscular endurance and strength shown through measurements of functional capacity and personal statement of participant.       Increase Strength and Stamina  Yes       Intervention  Provide advice, education, support and counseling about physical activity/exercise needs.;Develop an individualized exercise prescription for aerobic and resistive training based on initial evaluation findings, risk stratification, comorbidities and participant's  personal goals.       Expected Outcomes  Achievement of increased cardiorespiratory fitness and enhanced flexibility, muscular endurance and strength shown through measurements of functional capacity and personal statement of participant.       Able to understand and use rate of perceived exertion (RPE) scale  Yes       Intervention  Provide education and explanation on how to use RPE scale       Expected Outcomes  Short Term: Able to use RPE daily in rehab to express subjective intensity level;Long Term:  Able to use RPE to guide intensity level when exercising independently       Able to understand and use Dyspnea scale  Yes       Intervention  Provide education and explanation on how to use Dyspnea scale       Expected Outcomes  Short Term: Able to use Dyspnea scale daily in rehab to express subjective sense of shortness of breath during exertion;Long Term: Able to use Dyspnea scale to guide intensity level when exercising independently       Knowledge and understanding of Target Heart Rate Range (THRR)  Yes       Intervention  Provide education and explanation of THRR including how the numbers were predicted and where they are located for reference       Expected Outcomes  Short Term: Able to state/look up THRR;Long Term: Able to use THRR to govern intensity when exercising independently;Short Term: Able to use daily as guideline for intensity in rehab  Able to check pulse independently  Yes       Intervention  Provide education and demonstration on how to check pulse in carotid and radial arteries.;Review the importance of being able to check your own pulse for safety during independent exercise       Expected Outcomes  Short Term: Able to explain why pulse checking is important during independent exercise;Long Term: Able to check pulse independently and accurately       Understanding of Exercise Prescription  Yes       Intervention  Provide education, explanation, and written materials on  patient's individual exercise prescription       Expected Outcomes  Short Term: Able to explain program exercise prescription;Long Term: Able to explain home exercise prescription to exercise independently          Exercise Goals Re-Evaluation : Exercise Goals Re-Evaluation    Row Name 08/22/17 0746 08/24/17 0846 09/07/17 0835         Exercise Goal Re-Evaluation   Exercise Goals Review  Increase Physical Activity;Increase Strength and Stamina;Able to understand and use rate of perceived exertion (RPE) scale;Knowledge and understanding of Target Heart Rate Range (THRR);Understanding of Exercise Prescription  Increase Physical Activity;Increase Strength and Stamina  Increase Physical Activity;Increase Strength and Stamina;Understanding of Exercise Prescription     Comments  Reviewed RPE scale, THR and program prescription with pt today.  Pt voiced understanding and was given a copy of goals to take home.   Reviewed home exercise with pt today.  Pt plans to walk 2 extra days at home for exercise.  Reviewed THR, pulse, RPE, sign and symptoms, NTG use, and when to call 911 or MD.  Also discussed weather considerations and indoor options.  Pt voiced understanding. Looking into getting him new shoes and help with his medications through the [facility].  [name] is doing well in rehab.  He is getting stronger and has more stamina.  He does not like getting up so early to exercise.  He has started walking more at home and is up to every day.  He has gone to get fitted for new shoes and is just waiting for approval to pick them up.     Expected Outcomes  Short: Use RPE daily to regulate intensity.  Long: Follow program prescription in THR.  Short: add 2 days of walking at home. Long: maintain a home exercise routine.  Short: Get new shoes.  Long: Continue to increase time at home to 30 min.        Discharge Exercise Prescription (Final Exercise Prescription Changes): Exercise Prescription Changes -  09/08/17 1300      Response to Exercise   Blood Pressure (Admit)  134/72    Blood Pressure (Exercise)  124/70    Blood Pressure (Exit)  122/74    Heart Rate (Admit)  78 bpm    Heart Rate (Exercise)  101 bpm    Heart Rate (Exit)  77 bpm    Rating of Perceived Exertion (Exercise)  15    Symptoms  fatigue    Duration  Continue with 45 min of aerobic exercise without signs/symptoms of physical distress.    Intensity  THRR unchanged      Progression   Progression  Continue to progress workloads to maintain intensity without signs/symptoms of physical distress.    Average METs  3.87      Resistance Training   Training Prescription  Yes    Weight  4 lbs    Reps  10-15      Interval Training   Interval Training  No      Treadmill   MPH  2.5    Grade  2    Minutes  15    METs  3.6      Recumbant Bike   Level  4    Watts  70    Minutes  15    METs  4.1      REL-XR   Level  3    Minutes  15    METs  3.9      Home Exercise Plan   Plans to continue exercise at  Home (comment) walking at home    Frequency  Add 2 additional days to program exercise sessions.    Initial Home Exercises Provided  08/24/17       Nutrition:  Target Goals: Understanding of nutrition guidelines, daily intake of sodium 1500mg , cholesterol 200mg , calories 30% from fat and 7% or less from saturated fats, daily to have 5 or more servings of fruits and vegetables.  Biometrics: Pre Biometrics - 08/18/17 1416      Pre Biometrics   Height  5' 11.5" (1.816 m)    Weight  239 lb 12.8 oz (108.8 kg)    Waist Circumference  40.5 inches    Hip Circumference  44.25 inches    Waist to Hip Ratio  0.92 %    BMI (Calculated)  32.98    Single Leg Stand  30 seconds        Nutrition Therapy Plan and Nutrition Goals:   Nutrition Discharge: Rate Your Plate Scores: Nutrition Assessments - 08/18/17 1451      MEDFICTS Scores   Pre Score  27       Nutrition Goals Re-Evaluation: Nutrition Goals  Re-Evaluation    Row Name 09/07/17 0843             Goals   Nutrition Goal  Meet with nutritionist.       Comment  [name] would like to meet with dietician but would like to meet in class based off his home schedule.  We will get [name] to meet with him during class.  He would like to focus on weight loss and new recipes.        Expected Outcome  Short: Meet with nutritionist.  Long; Stick with heart healthy.   (Significant)           Nutrition Goals Discharge (Final Nutrition Goals Re-Evaluation): Nutrition Goals Re-Evaluation - 09/07/17 0843      Goals   Nutrition Goal  Meet with nutritionist.    Comment  [name] would like to meet with dietician but would like to meet in class based off his home schedule.  We will get [name] to meet with him during class.  He would like to focus on weight loss and new recipes.     Expected Outcome  Short: Meet with nutritionist.  Long; Stick with heart healthy.   (Significant)        Psychosocial: Target Goals: Acknowledge presence or absence of significant depression and/or stress, maximize coping skills, provide positive support system. Participant is able to verbalize types and ability to use techniques and skills needed for reducing stress and depression.   Initial Review & Psychosocial Screening: Initial Psych Review & Screening - 08/18/17 1451      Initial Review   Current issues with  History of Depression;Current Sleep Concerns;Current Stress Concerns;Current Anxiety/Panic    Source of  Stress Concerns  Financial;Occupation;Unable to participate in former interests or hobbies    Comments  [name] is under a lot of stress related to his disability being under investigation. It has been alternating between approved and revoked for almost a year now. He's been on disability for 10 years and has gotten used to that lifestyle, but now it is a struggle to make ends meet, especially when it comes to his medication. He says he is indifferent about the outcome  of his health and annoyed with the lack of consistent help. He currently stays at home and homeschools their 58 year old son.      Family Dynamics   Good Support System?  Yes Wife and son      Screening Interventions   Interventions  Yes;Encouraged to exercise;Program counselor consult    Expected Outcomes  Short Term goal: Utilizing psychosocial counselor, staff and physician to assist with identification of specific Stressors or current issues interfering with healing process. Setting desired goal for each stressor or current issue identified.;Long Term Goal: Stressors or current issues are controlled or eliminated.;Short Term goal: Identification and review with participant of any Quality of Life or Depression concerns found by scoring the questionnaire.;Long Term goal: The participant improves quality of Life and PHQ9 Scores as seen by post scores and/or verbalization of changes       Quality of Life Scores:  Quality of Life - 08/18/17 1455      Quality of Life Scores   Health/Function Pre  19.33 %    Socioeconomic Pre  17.5 %    Psych/Spiritual Pre  18.86 %    Family Pre  20 %    GLOBAL Pre  18.91 %       PHQ-9: Recent Review Flowsheet Data    Depression screen [facility] 2/9 08/18/2017 03/18/2017   Decreased Interest 2 3   Down, Depressed, Hopeless 3 3   PHQ - 2 Score 5 6   Altered sleeping 3 3   Tired, decreased energy 3 3   Change in appetite 2 0   Feeling bad or failure about yourself  3 3   Trouble concentrating 3 3   Moving slowly or fidgety/restless 2 1   Suicidal thoughts 2  0   PHQ-9 Score 23 19   Difficult doing work/chores Very difficult Somewhat difficult     Interpretation of Total Score  Total Score Depression Severity:  1-4 = Minimal depression, 5-9 = Mild depression, 10-14 = Moderate depression, 15-19 = Moderately severe depression, 20-27 = Severe depression   Psychosocial Evaluation and Intervention: Psychosocial Evaluation - 09/07/17 0927      Psychosocial  Evaluation & Interventions   Comments  Counselor follow up with [name] today.  He was not here on Wednesday through the Thanksgiving Holiday or this past Monday.  He reports having traveled over the holiday and it was "stressful."  He reports seeing his Dr. who prescribed him an anti-depressant - not sure of the name - but he began taking it on 11/13.  Assessed [name] reporting no negative side effects so far.  He feels "not as negative" and has had some positive sleep nights but not consistently.  He continues to struggle with sleep and reports having a "restless mind" with racing thoughts that prevent sleep at times.  He denies thoughts or plan of self harm currently.  He is to follow up with his Dr. the end of December re: the medication and sleep concerns.  Counselor reminded him if  his thoughts got worse or more negative to call his Dr. immediately - and he agreed to do so.  Counselor will continue to follow.     Expected Outcomes  [name] will continue to work out consistently and take his medication for mood and sleep as prescribed.  He will call the Dr. if his mood worsens or he has thoughts of self-harm.  Counselor will continue to follow.        Psychosocial Re-Evaluation: Psychosocial Re-Evaluation    Row Name 08/24/17 [phone] 08/26/17 1026 09/07/17 0845         Psychosocial Re-Evaluation   Current issues with  Current Stress Concerns  Current Stress Concerns  Current Stress Concerns     Comments  [name] was not able to exercise today due to his blood sugar being too high.  He did take his medication today but he does sturggle finanacially to get his medications.  We are going to look into trying to get him help with paying for his medications.  We are also going to try to get him some new shoes for exercise.  Also talked to [name] today about the Medication Management clinic to get help to pay for his medicaitons. He will get in contact with them.  We are also working the [employer] to fund his new shoes  that he got fitted for.   [name] was able to afford his meds this month but will still work with medication clinic.   He is waiting for shoes to get approved.  The best part of rehab he is getting better with his strength and stamina.  He also had an interview and got the job, but turned it down.  We talked about vocational rehab and he is interested.     Expected Outcomes  Short: Go get fitted for new shoes.  Long: Find some financial help for his medications.   Short: Contact Medication Clinic  Long: Get help financially for medications.  Short: Complete vocational rehab papers.  Long: Continue to work on Pharmacologist.      Interventions  Encouraged to attend Cardiac Rehabilitation for the exercise;Stress management education  Stress management education;Encouraged to attend Cardiac Rehabilitation for the exercise  Encouraged to attend Cardiac Rehabilitation for the exercise;Stress management education     Continue Psychosocial Services   Follow up required by counselor  -  Follow up required by counselor       Initial Review   Source of Stress Concerns  Financial  Financial  Financial;Unable to perform yard/household activities;Unable to participate in former interests or hobbies        Psychosocial Discharge (Final Psychosocial Re-Evaluation): Psychosocial Re-Evaluation - 09/07/17 0845      Psychosocial Re-Evaluation   Current issues with  Current Stress Concerns    Comments  [name] was able to afford his meds this month but will still work with medication clinic.   He is waiting for shoes to get approved.  The best part of rehab he is getting better with his strength and stamina.  He also had an interview and got the job, but turned it down.  We talked about vocational rehab and he is interested.    Expected Outcomes  Short: Complete vocational rehab papers.  Long: Continue to work on Pharmacologist.     Interventions  Encouraged to attend Cardiac Rehabilitation for the exercise;Stress management  education    Continue Psychosocial Services   Follow up required by counselor      Initial  Review   Source of Stress Concerns  Financial;Unable to perform yard/household activities;Unable to participate in former interests or hobbies       Vocational Rehabilitation: Provide vocational rehab assistance to qualifying candidates.   Vocational Rehab Evaluation & Intervention: Vocational Rehab - 09/07/17 1610      Initial Vocational Rehab Evaluation & Intervention   Assessment shows need for Vocational Rehabilitation  Yes    Vocational Rehab Packet given to patient  09/07/17    Documents faxed to [facility] Dept of Vocational Rehabilitation  09/07/17       Education: Education Goals: Education classes will be provided on a variety of topics geared toward better understanding of heart health and risk factor modification. Participant will state understanding/return demonstration of topics presented as noted by education test scores.  Learning Barriers/Preferences: Learning Barriers/Preferences - 08/18/17 1457      Learning Barriers/Preferences   Learning Barriers  None    Learning Preferences  None       Education Topics: General Nutrition Guidelines/Fats and Fiber: -Group instruction provided by verbal, written material, models and posters to present the general guidelines for heart healthy nutrition. Gives an explanation and review of dietary fats and fiber.   Controlling Sodium/Reading Food Labels: -Group verbal and written material supporting the discussion of sodium use in heart healthy nutrition. Review and explanation with models, verbal and written materials for utilization of the food label.   Exercise Physiology & Risk Factors: - Group verbal and written instruction with models to review the exercise physiology of the cardiovascular system and associated critical values. Details cardiovascular disease risk factors and the goals associated with each risk factor.   Aerobic  Exercise & Resistance Training: - Gives group verbal and written discussion on the health impact of inactivity. On the components of aerobic and resistive training programs and the benefits of this training and how to safely progress through these programs.   Flexibility, Balance, General Exercise Guidelines: - Provides group verbal and written instruction on the benefits of flexibility and balance training programs. Provides general exercise guidelines with specific guidelines to those with heart or lung disease. Demonstration and skill practice provided.   Stress Management: - Provides group verbal and written instruction about the health risks of elevated stress, cause of high stress, and healthy ways to reduce stress.   Cardiac Rehab from 09/07/2017 in [facility] Cardiac and Pulmonary Rehab  Date  08/24/17  Educator  [facility]  Instruction Review Code  1- Verbalizes Understanding      Depression: - Provides group verbal and written instruction on the correlation between heart/lung disease and depressed mood, treatment options, and the stigmas associated with seeking treatment.   Anatomy & Physiology of the Heart: - Group verbal and written instruction and models provide basic cardiac anatomy and physiology, with the coronary electrical and arterial systems. Review of: AMI, Angina, Valve disease, Heart Failure, Cardiac Arrhythmia, Pacemakers, and the ICD.   Cardiac Rehab from 09/07/2017 in [facility] Cardiac and Pulmonary Rehab  Date  08/22/17  Educator  CE  Instruction Review Code  1- Verbalizes Understanding      Cardiac Procedures: - Group verbal and written instruction to review commonly prescribed medications for heart disease. Reviews the medication, class of the drug, and side effects. Includes the steps to properly store meds and maintain the prescription regimen. (beta blockers and nitrates)   Cardiac Rehab from 09/07/2017 in [facility] Cardiac and Pulmonary Rehab  Date  08/29/17  Educator  CE   Instruction Review Code  1- Verbalizes Understanding      Cardiac Medications I: - Group verbal and written instruction to review commonly prescribed medications for heart disease. Reviews the medication, class of the drug, and side effects. Includes the steps to properly store meds and maintain the prescription regimen.   Cardiac Medications II: -Group verbal and written instruction to review commonly prescribed medications for heart disease. Reviews the medication, class of the drug, and side effects. (all other drug classes)    Go Sex-Intimacy & Heart Disease, Get SMART - Goal Setting: - Group verbal and written instruction through game format to discuss heart disease and the return to sexual intimacy. Provides group verbal and written material to discuss and apply goal setting through the application of the S.M.A.R.T. Method.   Cardiac Rehab from 09/07/2017 in [facility] Cardiac and Pulmonary Rehab  Date  08/29/17  Educator  CE  Instruction Review Code  1- Verbalizes Understanding      Other Matters of the Heart: - Provides group verbal, written materials and models to describe Heart Failure, Angina, Valve Disease, Peripheral Artery Disease, and Diabetes in the realm of heart disease. Includes description of the disease process and treatment options available to the cardiac patient.   Cardiac Rehab from 09/07/2017 in [facility] Cardiac and Pulmonary Rehab  Date  08/22/17  Educator  CE  Instruction Review Code  1- Verbalizes Understanding      Exercise & Equipment Safety: - Individual verbal instruction and demonstration of equipment use and safety with use of the equipment.   Cardiac Rehab from 09/07/2017 in [facility] Cardiac and Pulmonary Rehab  Date  08/18/17  Educator  [facility]  Instruction Review Code  1- Verbalizes Understanding      Infection Prevention: - Provides verbal and written material to individual with discussion of infection control including proper hand washing and proper  equipment cleaning during exercise session.   Cardiac Rehab from 09/07/2017 in [facility] Cardiac and Pulmonary Rehab  Date  08/18/17  Educator  [facility]  Instruction Review Code  1- Verbalizes Understanding      Falls Prevention: - Provides verbal and written material to individual with discussion of falls prevention and safety.   Cardiac Rehab from 09/07/2017 in [facility] Cardiac and Pulmonary Rehab  Date  08/18/17  Educator  [facility]  Instruction Review Code  1- Verbalizes Understanding      Diabetes: - Individual verbal and written instruction to review signs/symptoms of diabetes, desired ranges of glucose level fasting, after meals and with exercise. Acknowledge that pre and post exercise glucose checks will be done for 3 sessions at entry of program.   Cardiac Rehab from 09/07/2017 in [facility] Cardiac and Pulmonary Rehab  Date  08/18/17  Educator  [facility]  Instruction Review Code  1- Verbalizes Understanding      Other: -Provides group and verbal instruction on various topics (see comments)    Knowledge Questionnaire Score: Knowledge Questionnaire Score - 08/18/17 1457      Knowledge Questionnaire Score   Pre Score  12/28 correct answers reviewed with [name]        Core Components/Risk Factors/Patient Goals at Admission: Personal Goals and Risk Factors at Admission - 08/18/17 1447      Core Components/Risk Factors/Patient Goals on Admission    Weight Management  Yes;Weight Loss    Intervention  Weight Management: Develop a combined nutrition and exercise program designed to reach desired caloric intake, while maintaining appropriate intake of nutrient and fiber, sodium and fats, and appropriate energy expenditure required for the  weight goal.;Weight Management: Provide education and appropriate resources to help participant work on and attain dietary goals.;Weight Management/Obesity: Establish reasonable short term and long term weight goals.    Admit Weight  239 lb 12.8 oz (108.8 kg)    Goal  Weight: Short Term  234 lb (106.1 kg)    Goal Weight: Long Term  200 lb (90.7 kg)    Expected Outcomes  Short Term: Continue to assess and modify interventions until short term weight is achieved;Long Term: Adherence to nutrition and physical activity/exercise program aimed toward attainment of established weight goal;Weight Maintenance: Understanding of the daily nutrition guidelines, which includes 25-35% calories from fat, 7% or less cal from saturated fats, less than 200mg  cholesterol, less than 1.5gm of sodium, & 5 or more servings of fruits and vegetables daily;Weight Loss: Understanding of general recommendations for a balanced deficit meal plan, which promotes 1-2 lb weight loss per week and includes a negative energy balance of [phone] kcal/d;Understanding recommendations for meals to include 15-35% energy as protein, 25-35% energy from fat, 35-60% energy from carbohydrates, less than 200mg  of dietary cholesterol, 20-35 gm of total fiber daily;Understanding of distribution of calorie intake throughout the day with the consumption of 4-5 meals/snacks    Diabetes  Yes    Intervention  Provide education about signs/symptoms and action to take for hypo/hyperglycemia.;Provide education about proper nutrition, including hydration, and aerobic/resistive exercise prescription along with prescribed medications to achieve blood glucose in normal ranges: Fasting glucose 65-99 mg/dL    Expected Outcomes  Long Term: Attainment of HbA1C < 7%.;Short Term: Participant verbalizes understanding of the signs/symptoms and immediate care of hyper/hypoglycemia, proper foot care and importance of medication, aerobic/resistive exercise and nutrition plan for blood glucose control.    Hypertension  Yes    Intervention  Provide education on lifestyle modifcations including regular physical activity/exercise, weight management, moderate sodium restriction and increased consumption of fresh fruit, vegetables, and low fat  dairy, alcohol moderation, and smoking cessation.;Monitor prescription use compliance.    Expected Outcomes  Short Term: Continued assessment and intervention until BP is < 140/91mm HG in hypertensive participants. < 130/50mm HG in hypertensive participants with diabetes, heart failure or chronic kidney disease.;Long Term: Maintenance of blood pressure at goal levels.    Stress  Yes [name] has a lot of financial stress related to his disability being under investigation. Financially, his family is struggling even with his disabily money and is having issues getting medication.     Intervention  Offer individual and/or small group education and counseling on adjustment to heart disease, stress management and health-related lifestyle change. Teach and support self-help strategies.;Refer participants experiencing significant psychosocial distress to appropriate mental health specialists for further evaluation and treatment. When possible, include family members and significant others in education/counseling sessions.    Expected Outcomes  Short Term: Participant demonstrates changes in health-related behavior, relaxation and other stress management skills, ability to obtain effective social support, and compliance with psychotropic medications if prescribed.;Long Term: Emotional wellbeing is indicated by absence of clinically significant psychosocial distress or social isolation.       Core Components/Risk Factors/Patient Goals Review:  Goals and Risk Factor Review    Row Name 09/07/17 0837             Core Components/Risk Factors/Patient Goals Review   Personal Goals Review  Weight Management/Obesity;Hypertension;Lipids;Diabetes       Review  [name] has been doing well in rehab.  His weight is up a little after the holiday eating.  He was able to get his medications this month and they are working.  His blood sugars continue to be all over the map but today was the lowest it has been in months.  Osborne's  blood pressures have been good and at home.  He will have a follow up on Dec 30.         Expected Outcomes  Short: Continue to keep eye on blood sugars.  Long: Continue to work on risk factor modifications.           Core Components/Risk Factors/Patient Goals at Discharge (Final Review):  Goals and Risk Factor Review - 09/07/17 0837      Core Components/Risk Factors/Patient Goals Review   Personal Goals Review  Weight Management/Obesity;Hypertension;Lipids;Diabetes    Review  [name] has been doing well in rehab.  His weight is up a little after the holiday eating.  He was able to get his medications this month and they are working.  His blood sugars continue to be all over the map but today was the lowest it has been in months.  [name]'s blood pressures have been good and at home.  He will have a follow up on Dec 30.      Expected Outcomes  Short: Continue to keep eye on blood sugars.  Long: Continue to work on risk factor modifications.        ITP Comments: ITP Comments    Row Name 08/18/17 1444 08/26/17 1025 09/14/17 0554       ITP Comments  Med Review Completed. Initial ITP created. Diagnosis can be found in [facility] 07/18/17  After exercise was over, [name] felt weak and lightheaded.  Blood sugar was 285 but his blood pressure had dropped a little in the 100s.  He was given water and rested.  His blood pressure was 118/60 at discharge.   30 day review. Continue with ITP unless directed changes per Medical Director review.         Comments:

## 2017-09-14 NOTE — Telephone Encounter (Signed)
[name] has missed a week.  Called to check on him.  Left message.

## 2017-09-28 ENCOUNTER — Telehealth: Payer: Self-pay | Admitting: *Deleted

## 2017-09-28 ENCOUNTER — Encounter: Payer: Self-pay | Admitting: *Deleted

## 2017-09-28 DIAGNOSIS — Z955 Presence of coronary angioplasty implant and graft: Secondary | ICD-10-CM

## 2017-09-28 DIAGNOSIS — I213 ST elevation (STEMI) myocardial infarction of unspecified site: Secondary | ICD-10-CM

## 2017-09-28 NOTE — Telephone Encounter (Signed)
Called to check on status of return.  [name] has been out since 09/07/17.  Left message on voicemail.

## 2017-09-29 ENCOUNTER — Telehealth: Payer: Self-pay | Admitting: Cardiovascular Disease

## 2017-09-29 NOTE — Telephone Encounter (Signed)
Received records request Disability Determination Services, forwarded to [employer] for processing.  

## 2017-09-29 NOTE — Telephone Encounter (Signed)
[facility] records [facility]

## 2017-10-05 ENCOUNTER — Telehealth: Payer: Self-pay | Admitting: *Deleted

## 2017-10-05 ENCOUNTER — Encounter: Payer: Self-pay | Admitting: *Deleted

## 2017-10-05 NOTE — Telephone Encounter (Signed)
Called to check on patient.  Out since 11/28.  Left message.

## 2017-10-06 NOTE — Telephone Encounter (Signed)
[name] left a message to let us know that he has returned to work.  Left him a message to verify that he wants to be discharged.

## 2017-10-12 ENCOUNTER — Encounter: Payer: Medicare Other | Attending: Cardiovascular Disease

## 2017-10-12 ENCOUNTER — Encounter: Payer: Self-pay | Admitting: *Deleted

## 2017-10-12 DIAGNOSIS — I2129 ST elevation (STEMI) myocardial infarction involving other sites: Secondary | ICD-10-CM | POA: Insufficient documentation

## 2017-10-12 DIAGNOSIS — Z955 Presence of coronary angioplasty implant and graft: Secondary | ICD-10-CM | POA: Insufficient documentation

## 2017-10-12 DIAGNOSIS — I213 ST elevation (STEMI) myocardial infarction of unspecified site: Secondary | ICD-10-CM

## 2017-10-12 NOTE — Progress Notes (Signed)
Cardiac Individual Treatment Plan  Patient Details  Name: [name] MRN: [id number] Date of Birth: [birth year] Referring Provider:     Cardiac Rehab from 08/18/2017 in [facility] Cardiac and Pulmonary Rehab  Referring Provider  Gollan      Initial Encounter Date:    Cardiac Rehab from 08/18/2017 in [facility] Cardiac and Pulmonary Rehab  Date  08/18/17  Referring Provider  Mariah Milling      Visit Diagnosis: ST elevation myocardial infarction (STEMI), unspecified artery [facility])  Status post coronary artery stent placement  Patient's Home Medications on Admission:  Current Outpatient Medications:  .  aspirin EC 81 MG tablet, Take 81 mg by mouth daily., Disp: , Rfl:  .  atorvastatin (LIPITOR) 80 MG tablet, Take 1 tablet (80 mg total) daily by mouth., Disp: 90 tablet, Rfl: 3 .  glucose blood test strip, Use as instructed, Disp: 100 each, Rfl: 12 .  insulin aspart protamine- aspart (NOVOLOG MIX 70/30) (70-30) 100 UNIT/ML injection, Inject 15 Units into the skin 3 (three) times daily., Disp: , Rfl:  .  losartan (COZAAR) 25 MG tablet, Take 1 tablet (25 mg total) at bedtime by mouth., Disp: 90 tablet, Rfl: 4 .  metoprolol tartrate (LOPRESSOR) 25 MG tablet, Take 1 tablet (25 mg total) 2 (two) times daily by mouth., Disp: 180 tablet, Rfl: 4 .  Multiple Vitamin (MULTIVITAMIN) tablet, Take 2 tablets by mouth daily. , Disp: , Rfl:  .  ticagrelor (BRILINTA) 90 MG TABS tablet, Take 1 tablet (90 mg total) 2 (two) times daily by mouth., Disp: 60 tablet, Rfl: 11  Past Medical History: Past Medical History:  Diagnosis Date  . Coronary atherosclerosis of artery bypass graft    a. 10/2006 s/p PCI/stenting of LCX;  b. 02/2007 s/p CABG x 4 @ age 59 (LIMA->LAD, RIMA->Diag, VG->OM, VG->RPDA;  c. 06/2017 Lateral STEMI/PCI: LM 50, LAD 100ost, 29m, RI small, LCX 95ost/p (PTCA - 2.28mm balloon), 70p/m ISR, OM2 75, RCA 100p/m, VG->RPDA 25, VG->OM2 80ost (3.0x15 Sierra DES), RIMA->Diag 100, LIMA->LAD nl.  . HFrEF (heart failure  with reduced ejection fraction) (HCC)    a. 07/2017 Echo: EF 35-40%  . HLD (hyperlipidemia)    poorly controlled  . HTN (hypertension)   . Hx-TIA (transient ischemic attack) 2009   possible-([facility]) Normal MRI of brain and MRA of the head and neck  . Ischemic cardiomyopathy    a. 07/2017 Echo: EF 35-40%, apicalanteroseptal and apical AK, GR2 DD, mildly to mod reduced RV fxn.  . Kidney stone   . Noncompliance   . Obesity, unspecified   . Uncontrolled diabetes mellitus with complications (HCC)    a. Dx 2002 - poorly controlled in setting of noncompliance;  b. 07/2017 HbA1c 12.6.    Tobacco Use: Social History   Tobacco Use  Smoking Status Former Smoker  . Packs/day: 2.00  . Years: 14.00  . Pack years: 28.00  . Types: Cigarettes  . Last attempt to quit: 10/11/2004  . Years since quitting: 13.0  Smokeless Tobacco Former Neurosurgeon  . Types: Chew, Snuff  Tobacco Comment   used to smoke 1 1/2 ppd, quit 2006    Labs: Recent Review Flowsheet Data    Labs for ITP Cardiac and Pulmonary Rehab Latest Ref Rng & Units 05/30/2015 06/02/2015 09/29/2015 07/10/2017 07/11/2017   Cholestrol 0 - 200 mg/dL - - - 409(W) 119(J)   LDLCALC 0 - 99 mg/dL - - - 478(G) 956(O)   LDLDIRECT mg/dL - - - - -   HDL >13 mg/dL - - -  33(L) 24(L)   Trlycerides <150 mg/dL - - - 161 096(E)   Hemoglobin A1c 4.8 - 5.6 % - 12.0(H) 11.4(H) - 12.6(H)   PHART 7.350 - 7.450 - - - - -   PCO2ART 35.0 - 45.0 mmHg - - - - -   HCO3 21.0 - 28.0 mEq/L 24.3 - - - -   TCO2 0 - 100 mmol/L - - - - -   ACIDBASEDEF 0.0 - 2.0 mmol/L 1.3 - - - -   O2SAT % - - - - -       Exercise Target Goals:    Exercise Program Goal: Individual exercise prescription set with THRR, safety & activity barriers. Participant demonstrates ability to understand and report RPE using BORG scale, to self-measure pulse accurately, and to acknowledge the importance of the exercise prescription.  Exercise Prescription Goal: Starting with aerobic activity 30 plus  minutes a day, 3 days per week for initial exercise prescription. Provide home exercise prescription and guidelines that participant acknowledges understanding prior to discharge.  Activity Barriers & Risk Stratification: Activity Barriers & Cardiac Risk Stratification - 08/18/17 1459      Activity Barriers & Cardiac Risk Stratification   Activity Barriers  Back Problems;Chest Pain/Angina;Shortness of Breath    Cardiac Risk Stratification  High       6 Minute Walk: 6 Minute Walk    Row Name 08/18/17 1417         6 Minute Walk   Distance  1225 feet     Walk Time  6 minutes     # of Rest Breaks  0     MPH  2.32     METS  4.23     RPE  11     Perceived Dyspnea   2     VO2 Peak  14.8     Symptoms  No     Resting HR  77 bpm     Resting BP  112/66     Resting Oxygen Saturation   100 %     Exercise Oxygen Saturation  during 6 min walk  100 %     Max Ex. HR  101 bpm     Max Ex. BP  124/66     2 Minute Post BP  114/66        Oxygen Initial Assessment:   Oxygen Re-Evaluation:   Oxygen Discharge (Final Oxygen Re-Evaluation):   Initial Exercise Prescription: Initial Exercise Prescription - 08/18/17 1400      Date of Initial Exercise RX and Referring Provider   Date  08/18/17    Referring Provider  Gollan      Treadmill   MPH  2.5    Grade  2    Minutes  15    METs  3.6      Recumbant Bike   Level  3    RPM  60    Watts  70    Minutes  15    METs  4.2      REL-XR   Level  3    Watts  70    Speed  50    Minutes  15    METs  4.2      Prescription Details   Frequency (times per week)  3    Duration  Progress to 45 minutes of aerobic exercise without signs/symptoms of physical distress      Intensity   THRR 40-80% of Max Heartrate  119-161  Ratings of Perceived Exertion  11-13    Perceived Dyspnea  0-4      Resistance Training   Training Prescription  Yes    Weight  4 lbs    Reps  10-15       Perform Capillary Blood Glucose checks as  needed.  Exercise Prescription Changes: Exercise Prescription Changes    Row Name 08/18/17 1400 08/23/17 1400 08/24/17 0800 09/08/17 1300       Response to Exercise   Blood Pressure (Admit)  112/66  114/66  -  134/72    Blood Pressure (Exercise)  124/66  134/74  -  124/70    Blood Pressure (Exit)  114/66  138/82  -  122/74    Heart Rate (Admit)  78 bpm  60 bpm  -  78 bpm    Heart Rate (Exercise)  101 bpm  124 bpm  -  101 bpm    Heart Rate (Exit)  90 bpm  89 bpm  -  77 bpm    Rating of Perceived Exertion (Exercise)  11  18  -  15    Symptoms  -  fatigue, chest soreness 4/10   -  fatigue    Comments  -  first full day of exercise  -  -    Duration  -  Progress to 45 minutes of aerobic exercise without signs/symptoms of physical distress  -  Continue with 45 min of aerobic exercise without signs/symptoms of physical distress.    Intensity  -  THRR unchanged  -  THRR unchanged      Progression   Progression  -  Continue to progress workloads to maintain intensity without signs/symptoms of physical distress.  -  Continue to progress workloads to maintain intensity without signs/symptoms of physical distress.    Average METs  -  3.11  -  3.87      Resistance Training   Training Prescription  -  Yes  -  Yes    Weight  -  - with [name] during weights today  -  4 lbs    Reps  -  -  -  10-15      Interval Training   Interval Training  -  No  -  No      Treadmill   MPH  -  2.5  -  2.5    Grade  -  2  -  2    Minutes  -  15  -  15    METs  -  3.6  -  3.6      Recumbant Bike   Level  -  3  -  4    Watts  -  22  -  70    Minutes  -  15  -  15    METs  -  2.63  -  4.1      REL-XR   Level  -  -  -  3    Minutes  -  -  -  15    METs  -  -  -  3.9      Home Exercise Plan   Plans to continue exercise at  -  -  Home (comment) walking at home  Home (comment) walking at home    Frequency  -  -  Add 2 additional days to program exercise sessions.  Add 2 additional days to program exercise  sessions.    Initial  Home Exercises Provided  -  -  08/24/17  08/24/17       Exercise Comments: Exercise Comments    Row Name 08/22/17 0746 08/26/17 1025         Exercise Comments  First full day of exercise!  Patient was oriented to gym and equipment including functions, settings, policies, and procedures.  Patient's individual exercise prescription and treatment plan were reviewed.  All starting workloads were established based on the results of the 6 minute walk test done at initial orientation visit.  The plan for exercise progression was also introduced and progression will be customized based on patient's performance and goals.  After exercise was over, [name] felt weak and lightheaded.  Blood sugar was 285 but his blood pressure had dropped a little in the 100s.  He was given water and rested.  His blood pressure was 118/60 at discharge.We are also working the [employer] to fund his new shoes that he got fitted for.          Exercise Goals and Review: Exercise Goals    Row Name 08/18/17 1416             Exercise Goals   Increase Physical Activity  Yes       Intervention  Provide advice, education, support and counseling about physical activity/exercise needs.;Develop an individualized exercise prescription for aerobic and resistive training based on initial evaluation findings, risk stratification, comorbidities and participant's personal goals.       Expected Outcomes  Achievement of increased cardiorespiratory fitness and enhanced flexibility, muscular endurance and strength shown through measurements of functional capacity and personal statement of participant.       Increase Strength and Stamina  Yes       Intervention  Provide advice, education, support and counseling about physical activity/exercise needs.;Develop an individualized exercise prescription for aerobic and resistive training based on initial evaluation findings, risk stratification, comorbidities and participant's  personal goals.       Expected Outcomes  Achievement of increased cardiorespiratory fitness and enhanced flexibility, muscular endurance and strength shown through measurements of functional capacity and personal statement of participant.       Able to understand and use rate of perceived exertion (RPE) scale  Yes       Intervention  Provide education and explanation on how to use RPE scale       Expected Outcomes  Short Term: Able to use RPE daily in rehab to express subjective intensity level;Long Term:  Able to use RPE to guide intensity level when exercising independently       Able to understand and use Dyspnea scale  Yes       Intervention  Provide education and explanation on how to use Dyspnea scale       Expected Outcomes  Short Term: Able to use Dyspnea scale daily in rehab to express subjective sense of shortness of breath during exertion;Long Term: Able to use Dyspnea scale to guide intensity level when exercising independently       Knowledge and understanding of Target Heart Rate Range (THRR)  Yes       Intervention  Provide education and explanation of THRR including how the numbers were predicted and where they are located for reference       Expected Outcomes  Short Term: Able to state/look up THRR;Long Term: Able to use THRR to govern intensity when exercising independently;Short Term: Able to use daily as guideline for intensity in rehab  Able to check pulse independently  Yes       Intervention  Provide education and demonstration on how to check pulse in carotid and radial arteries.;Review the importance of being able to check your own pulse for safety during independent exercise       Expected Outcomes  Short Term: Able to explain why pulse checking is important during independent exercise;Long Term: Able to check pulse independently and accurately       Understanding of Exercise Prescription  Yes       Intervention  Provide education, explanation, and written materials on  patient's individual exercise prescription       Expected Outcomes  Short Term: Able to explain program exercise prescription;Long Term: Able to explain home exercise prescription to exercise independently          Exercise Goals Re-Evaluation : Exercise Goals Re-Evaluation    Row Name 08/22/17 0746 08/24/17 0846 09/07/17 0835 09/20/17 1409 10/05/17 1603     Exercise Goal Re-Evaluation   Exercise Goals Review  Increase Physical Activity;Increase Strength and Stamina;Able to understand and use rate of perceived exertion (RPE) scale;Knowledge and understanding of Target Heart Rate Range (THRR);Understanding of Exercise Prescription  Increase Physical Activity;Increase Strength and Stamina  Increase Physical Activity;Increase Strength and Stamina;Understanding of Exercise Prescription  -  -   Comments  Reviewed RPE scale, THR and program prescription with pt today.  Pt voiced understanding and was given a copy of goals to take home.   Reviewed home exercise with pt today.  Pt plans to walk 2 extra days at home for exercise.  Reviewed THR, pulse, RPE, sign and symptoms, NTG use, and when to call 911 or MD.  Also discussed weather considerations and indoor options.  Pt voiced understanding. Looking into getting him new shoes and help with his medications through the [facility].  [name] is doing well in rehab.  He is getting stronger and has more stamina.  He does not like getting up so early to exercise.  He has started walking more at home and is up to every day.  He has gone to get fitted for new shoes and is just waiting for approval to pick them up.  Out since last review  Out since last review   Expected Outcomes  Short: Use RPE daily to regulate intensity.  Long: Follow program prescription in THR.  Short: add 2 days of walking at home. Long: maintain a home exercise routine.  Short: Get new shoes.  Long: Continue to increase time at home to 30 min.  -  -      Discharge Exercise Prescription  (Final Exercise Prescription Changes): Exercise Prescription Changes - 09/08/17 1300      Response to Exercise   Blood Pressure (Admit)  134/72    Blood Pressure (Exercise)  124/70    Blood Pressure (Exit)  122/74    Heart Rate (Admit)  78 bpm    Heart Rate (Exercise)  101 bpm    Heart Rate (Exit)  77 bpm    Rating of Perceived Exertion (Exercise)  15    Symptoms  fatigue    Duration  Continue with 45 min of aerobic exercise without signs/symptoms of physical distress.    Intensity  THRR unchanged      Progression   Progression  Continue to progress workloads to maintain intensity without signs/symptoms of physical distress.    Average METs  3.87      Resistance Training   Training Prescription  Yes    Weight  4 lbs    Reps  10-15      Interval Training   Interval Training  No      Treadmill   MPH  2.5    Grade  2    Minutes  15    METs  3.6      Recumbant Bike   Level  4    Watts  70    Minutes  15    METs  4.1      REL-XR   Level  3    Minutes  15    METs  3.9      Home Exercise Plan   Plans to continue exercise at  Home (comment) walking at home    Frequency  Add 2 additional days to program exercise sessions.    Initial Home Exercises Provided  08/24/17       Nutrition:  Target Goals: Understanding of nutrition guidelines, daily intake of sodium 1500mg , cholesterol 200mg , calories 30% from fat and 7% or less from saturated fats, daily to have 5 or more servings of fruits and vegetables.  Biometrics: Pre Biometrics - 08/18/17 1416      Pre Biometrics   Height  5' 11.5" (1.816 m)    Weight  239 lb 12.8 oz (108.8 kg)    Waist Circumference  40.5 inches    Hip Circumference  44.25 inches    Waist to Hip Ratio  0.92 %    BMI (Calculated)  32.98    Single Leg Stand  30 seconds        Nutrition Therapy Plan and Nutrition Goals:   Nutrition Discharge: Rate Your Plate Scores: Nutrition Assessments - 08/18/17 1451      MEDFICTS Scores   Pre  Score  27       Nutrition Goals Re-Evaluation: Nutrition Goals Re-Evaluation    Row Name 09/07/17 0843             Goals   Nutrition Goal  Meet with nutritionist.       Comment  [name] would like to meet with dietician but would like to meet in class based off his home schedule.  We will get [name] to meet with him during class.  He would like to focus on weight loss and new recipes.        Expected Outcome  Short: Meet with nutritionist.  Long; Stick with heart healthy.   (Significant)           Nutrition Goals Discharge (Final Nutrition Goals Re-Evaluation): Nutrition Goals Re-Evaluation - 09/07/17 0843      Goals   Nutrition Goal  Meet with nutritionist.    Comment  [name] would like to meet with dietician but would like to meet in class based off his home schedule.  We will get [name] to meet with him during class.  He would like to focus on weight loss and new recipes.     Expected Outcome  Short: Meet with nutritionist.  Long; Stick with heart healthy.   (Significant)        Psychosocial: Target Goals: Acknowledge presence or absence of significant depression and/or stress, maximize coping skills, provide positive support system. Participant is able to verbalize types and ability to use techniques and skills needed for reducing stress and depression.   Initial Review & Psychosocial Screening: Initial Psych Review & Screening - 08/18/17 1451      Initial Review   Current issues with  History of Depression;Current Sleep Concerns;Current Stress Concerns;Current Anxiety/Panic    Source of Stress Concerns  Financial;Occupation;Unable to participate in former interests or hobbies    Comments  [name] is under a lot of stress related to his disability being under investigation. It has been alternating between approved and revoked for almost a year now. He's been on disability for 10 years and has gotten used to that lifestyle, but now it is a struggle to make ends meet, especially when it  comes to his medication. He says he is indifferent about the outcome of his health and annoyed with the lack of consistent help. 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      Family Dynamics   Good Support System?  Yes Wife and son      Screening Interventions   Interventions  Yes;Encouraged to exercise;Program counselor consult    Expected Outcomes  Short Term goal: Utilizing psychosocial counselor, staff and physician to assist with identification of specific Stressors or current issues interfering with healing process. Setting desired goal for each stressor or current issue identified.;Long Term Goal: Stressors or current issues are controlled or eliminated.;Short Term goal: Identification and review with participant of any Quality of Life or Depression concerns found by scoring the questionnaire.;Long Term goal: The participant improves quality of Life and PHQ9 Scores as seen by post scores and/or verbalization of changes       Quality of Life Scores:  Quality of Life - 08/18/17 1455      Quality of Life Scores   Health/Function Pre  19.33 %    Socioeconomic Pre  17.5 %    Psych/Spiritual Pre  18.86 %    Family Pre  20 %    GLOBAL Pre  18.91 %       PHQ-9: Recent Review Flowsheet Data    Depression screen [facility] 2/9 08/18/2017 03/18/2017   Decreased Interest 2 3   Down, Depressed, Hopeless 3 3   PHQ - 2 Score 5 6   Altered sleeping 3 3   Tired, decreased energy 3 3   Change in appetite 2 0   Feeling bad or failure about yourself  3 3   Trouble concentrating 3 3   Moving slowly or fidgety/restless 2 1   Suicidal thoughts 2  0   PHQ-9 Score 23 19   Difficult doing work/chores Very difficult Somewhat difficult     Interpretation of Total Score  Total Score Depression Severity:  1-4 = Minimal depression, 5-9 = Mild depression, 10-14 = Moderate depression, 15-19 = Moderately severe depression, 20-27 = Severe depression   Psychosocial Evaluation and  Intervention: Psychosocial Evaluation - 09/07/17 0927      Psychosocial Evaluation & Interventions   Comments  Counselor follow up with [name] today.  He was not here on Wednesday through the Thanksgiving Holiday or this past Monday.  He reports having traveled over the holiday and it was "stressful."  He reports seeing his Dr. who prescribed him an anti-depressant - not sure of the name - but he began taking it on 11/13.  Assessed [name] reporting no negative side effects so far.  He feels "not as negative" and has had some positive sleep nights but not consistently.  He continues to struggle with sleep and reports having a "restless mind" with racing thoughts that prevent sleep at times.  He denies thoughts or plan of self harm currently.  He is to follow up with his Dr. the end  of December re: the medication and sleep concerns.  Counselor reminded him if his thoughts got worse or more negative to call his Dr. immediately - and he agreed to do so.  Counselor will continue to follow.     Expected Outcomes  [name] will continue to work out consistently and take his medication for mood and sleep as prescribed.  He will call the Dr. if his mood worsens or he has thoughts of self-harm.  Counselor will continue to follow.        Psychosocial Re-Evaluation: Psychosocial Re-Evaluation    Row Name 08/24/17 [phone] 08/26/17 1026 09/07/17 0845         Psychosocial Re-Evaluation   Current issues with  Current Stress Concerns  Current Stress Concerns  Current Stress Concerns     Comments  [name] was not able to exercise today due to his blood sugar being too high.  He did take his medication today but he does sturggle finanacially to get his medications.  We are going to look into trying to get him help with paying for his medications.  We are also going to try to get him some new shoes for exercise.  Also talked to [name] today about the Medication Management clinic to get help to pay for his medicaitons. He will get in  contact with them.  We are also working the [employer] to fund his new shoes that he got fitted for.   [name] was able to afford his meds this month but will still work with medication clinic.   He is waiting for shoes to get approved.  The best part of rehab he is getting better with his strength and stamina.  He also had an interview and got the job, but turned it down.  We talked about vocational rehab and he is interested.     Expected Outcomes  Short: Go get fitted for new shoes.  Long: Find some financial help for his medications.   Short: Contact Medication Clinic  Long: Get help financially for medications.  Short: Complete vocational rehab papers.  Long: Continue to work on Pharmacologist.      Interventions  Encouraged to attend Cardiac [name] for the exercise;Stress management education  Stress management education;Encouraged to attend Cardiac [name] for the exercise  Encouraged to attend Cardiac [name] for the exercise;Stress management education     Continue Psychosocial Services   Follow up required by counselor  -  Follow up required by counselor       Initial Review   Source of Stress Concerns  Financial  Financial  Financial;Unable to perform yard/household activities;Unable to participate in former interests or hobbies        Psychosocial Discharge (Final Psychosocial Re-Evaluation): Psychosocial Re-Evaluation - 09/07/17 0845      Psychosocial Re-Evaluation   Current issues with  Current Stress Concerns    Comments  [name] was able to afford his meds this month but will still work with medication clinic.   He is waiting for shoes to get approved.  The best part of rehab he is getting better with his strength and stamina.  He also had an interview and got the job, but turned it down.  We talked about vocational rehab and he is interested.    Expected Outcomes  Short: Complete vocational rehab papers.  Long: Continue to work on Pharmacologist.     Interventions   Encouraged to attend Cardiac [name] for the exercise;Stress management education    Continue Psychosocial Services  Follow up required by counselor      Initial Review   Source of Stress Concerns  Financial;Unable to perform yard/household activities;Unable to participate in former interests or hobbies       Vocational [name]: Provide vocational rehab assistance to qualifying candidates.   Vocational Rehab Evaluation & Intervention: Vocational Rehab - 09/07/17 1610      Initial Vocational Rehab Evaluation & Intervention   Assessment shows need for Vocational [name]  Yes    Vocational Rehab Packet given to patient  09/07/17    Documents faxed to [name]  09/07/17       Education: Education Goals: Education classes will be provided on a variety of topics geared toward better understanding of heart health and risk factor modification. Participant will state understanding/return demonstration of topics presented as noted by education test scores.  Learning Barriers/Preferences: Learning Barriers/Preferences - 08/18/17 1457      Learning Barriers/Preferences   Learning Barriers  None    Learning Preferences  None       Education Topics: General Nutrition Guidelines/Fats and Fiber: -Group instruction provided by verbal, written material, models and posters to present the general guidelines for heart healthy nutrition. Gives an explanation and review of dietary fats and fiber.   Controlling Sodium/Reading Food Labels: -Group verbal and written material supporting the discussion of sodium use in heart healthy nutrition. Review and explanation with models, verbal and written materials for utilization of the food label.   Exercise Physiology & Risk Factors: - Group verbal and written instruction with models to review the exercise physiology of the cardiovascular system and associated critical values. Details cardiovascular disease  risk factors and the goals associated with each risk factor.   Aerobic Exercise & Resistance Training: - Gives group verbal and written discussion on the health impact of inactivity. On the components of aerobic and resistive training programs and the benefits of this training and how to safely progress through these programs.   Flexibility, Balance, General Exercise Guidelines: - Provides group verbal and written instruction on the benefits of flexibility and balance training programs. Provides general exercise guidelines with specific guidelines to those with heart or lung disease. Demonstration and skill practice provided.   Stress Management: - Provides group verbal and written instruction about the health risks of elevated stress, cause of high stress, and healthy ways to reduce stress.   Cardiac Rehab from 09/07/2017 in [facility] Cardiac and Pulmonary Rehab  Date  08/24/17  Educator  [facility]  Instruction Review Code  1- Verbalizes Understanding      Depression: - Provides group verbal and written instruction on the correlation between heart/lung disease and depressed mood, treatment options, and the stigmas associated with seeking treatment.   Anatomy & Physiology of the Heart: - Group verbal and written instruction and models provide basic cardiac anatomy and physiology, with the coronary electrical and arterial systems. Review of: AMI, Angina, Valve disease, Heart Failure, Cardiac Arrhythmia, Pacemakers, and the ICD.   Cardiac Rehab from 09/07/2017 in [facility] Cardiac and Pulmonary Rehab  Date  08/22/17  Educator  CE  Instruction Review Code  1- Verbalizes Understanding      Cardiac Procedures: - Group verbal and written instruction to review commonly prescribed medications for heart disease. Reviews the medication, class of the drug, and side effects. Includes the steps to properly store meds and maintain the prescription regimen. (beta blockers and nitrates)   Cardiac Rehab from  09/07/2017 in [facility] Cardiac and Pulmonary Rehab  Date  08/29/17  Educator  CE  Instruction Review Code  1- Verbalizes Understanding      Cardiac Medications I: - Group verbal and written instruction to review commonly prescribed medications for heart disease. Reviews the medication, class of the drug, and side effects. Includes the steps to properly store meds and maintain the prescription regimen.   Cardiac Medications II: -Group verbal and written instruction to review commonly prescribed medications for heart disease. Reviews the medication, class of the drug, and side effects. (all other drug classes)    Go Sex-Intimacy & Heart Disease, Get SMART - Goal Setting: - Group verbal and written instruction through game format to discuss heart disease and the return to sexual intimacy. Provides group verbal and written material to discuss and apply goal setting through the application of the S.M.A.R.T. Method.   Cardiac Rehab from 09/07/2017 in [facility] Cardiac and Pulmonary Rehab  Date  08/29/17  Educator  CE  Instruction Review Code  1- Verbalizes Understanding      Other Matters of the Heart: - Provides group verbal, written materials and models to describe Heart Failure, Angina, Valve Disease, Peripheral Artery Disease, and Diabetes in the realm of heart disease. Includes description of the disease process and treatment options available to the cardiac patient.   Cardiac Rehab from 09/07/2017 in [facility] Cardiac and Pulmonary Rehab  Date  08/22/17  Educator  CE  Instruction Review Code  1- Verbalizes Understanding      Exercise & Equipment Safety: - Individual verbal instruction and demonstration of equipment use and safety with use of the equipment.   Cardiac Rehab from 09/07/2017 in [facility] Cardiac and Pulmonary Rehab  Date  08/18/17  Educator  [facility]  Instruction Review Code  1- Verbalizes Understanding      Infection Prevention: - Provides verbal and written material to individual  with discussion of infection control including proper hand washing and proper equipment cleaning during exercise session.   Cardiac Rehab from 09/07/2017 in [facility] Cardiac and Pulmonary Rehab  Date  08/18/17  Educator  [facility]  Instruction Review Code  1- Verbalizes Understanding      Falls Prevention: - Provides verbal and written material to individual with discussion of falls prevention and safety.   Cardiac Rehab from 09/07/2017 in [facility] Cardiac and Pulmonary Rehab  Date  08/18/17  Educator  [facility]  Instruction Review Code  1- Verbalizes Understanding      Diabetes: - Individual verbal and written instruction to review signs/symptoms of diabetes, desired ranges of glucose level fasting, after meals and with exercise. Acknowledge that pre and post exercise glucose checks will be done for 3 sessions at entry of program.   Cardiac Rehab from 09/07/2017 in [facility] Cardiac and Pulmonary Rehab  Date  08/18/17  Educator  [facility]  Instruction Review Code  1- Verbalizes Understanding      Other: -Provides group and verbal instruction on various topics (see comments)    Knowledge Questionnaire Score: Knowledge Questionnaire Score - 08/18/17 1457      Knowledge Questionnaire Score   Pre Score  12/28 correct answers reviewed with [name]        Core Components/Risk Factors/Patient Goals at Admission: Personal Goals and Risk Factors at Admission - 08/18/17 1447      Core Components/Risk Factors/Patient Goals on Admission    Weight Management  Yes;Weight Loss    Intervention  Weight Management: Develop a combined nutrition and exercise program designed to reach desired caloric intake, while maintaining appropriate intake of nutrient and fiber,  sodium and fats, and appropriate energy expenditure required for the weight goal.;Weight Management: Provide education and appropriate resources to help participant work on and attain dietary goals.;Weight Management/Obesity: Establish reasonable short term and long  term weight goals.    Admit Weight  239 lb 12.8 oz (108.8 kg)    Goal Weight: Short Term  234 lb (106.1 kg)    Goal Weight: Long Term  200 lb (90.7 kg)    Expected Outcomes  Short Term: Continue to assess and modify interventions until short term weight is achieved;Long Term: Adherence to nutrition and physical activity/exercise program aimed toward attainment of established weight goal;Weight Maintenance: Understanding of the daily nutrition guidelines, which includes 25-35% calories from fat, 7% or less cal from saturated fats, less than 200mg  cholesterol, less than 1.5gm of sodium, & 5 or more servings of fruits and vegetables daily;Weight Loss: Understanding of general recommendations for a balanced deficit meal plan, which promotes 1-2 lb weight loss per week and includes a negative energy balance of [phone] kcal/d;Understanding recommendations for meals to include 15-35% energy as protein, 25-35% energy from fat, 35-60% energy from carbohydrates, less than 200mg  of dietary cholesterol, 20-35 gm of total fiber daily;Understanding of distribution of calorie intake throughout the day with the consumption of 4-5 meals/snacks    Diabetes  Yes    Intervention  Provide education about signs/symptoms and action to take for hypo/hyperglycemia.;Provide education about proper nutrition, including hydration, and aerobic/resistive exercise prescription along with prescribed medications to achieve blood glucose in normal ranges: Fasting glucose 65-99 mg/dL    Expected Outcomes  Long Term: Attainment of HbA1C < 7%.;Short Term: Participant verbalizes understanding of the signs/symptoms and immediate care of hyper/hypoglycemia, proper foot care and importance of medication, aerobic/resistive exercise and nutrition plan for blood glucose control.    Hypertension  Yes    Intervention  Provide education on lifestyle modifcations including regular physical activity/exercise, weight management, moderate sodium restriction  and increased consumption of fresh fruit, vegetables, and low fat dairy, alcohol moderation, and smoking cessation.;Monitor prescription use compliance.    Expected Outcomes  Short Term: Continued assessment and intervention until BP is < 140/65mm HG in hypertensive participants. < 130/109mm HG in hypertensive participants with diabetes, heart failure or chronic kidney disease.;Long Term: Maintenance of blood pressure at goal levels.    Stress  Yes [name] has a lot of financial stress related to his disability being under investigation. Financially, his family is struggling even with his disabily money and is having issues getting medication.     Intervention  Offer individual and/or small group education and counseling on adjustment to heart disease, stress management and health-related lifestyle change. Teach and support self-help strategies.;Refer participants experiencing significant psychosocial distress to appropriate mental health specialists for further evaluation and treatment. When possible, include family members and significant others in education/counseling sessions.    Expected Outcomes  Short Term: Participant demonstrates changes in health-related behavior, relaxation and other stress management skills, ability to obtain effective social support, and compliance with psychotropic medications if prescribed.;Long Term: Emotional wellbeing is indicated by absence of clinically significant psychosocial distress or social isolation.       Core Components/Risk Factors/Patient Goals Review:  Goals and Risk Factor Review    Row Name 09/07/17 0837             Core Components/Risk Factors/Patient Goals Review   Personal Goals Review  Weight Management/Obesity;Hypertension;Lipids;Diabetes       Review  [name] has been doing well in rehab.  His  weight is up a little after the holiday eating.  He was able to get his medications this month and they are working.  His blood sugars continue to be all over  the map but today was the lowest it has been in months.  [name]'s blood pressures have been good and at home.  He will have a follow up on Dec 30.         Expected Outcomes  Short: Continue to keep eye on blood sugars.  Long: Continue to work on risk factor modifications.           Core Components/Risk Factors/Patient Goals at Discharge (Final Review):  Goals and Risk Factor Review - 09/07/17 0837      Core Components/Risk Factors/Patient Goals Review   Personal Goals Review  Weight Management/Obesity;Hypertension;Lipids;Diabetes    Review  [name] has been doing well in rehab.  His weight is up a little after the holiday eating.  He was able to get his medications this month and they are working.  His blood sugars continue to be all over the map but today was the lowest it has been in months.  [place]'s blood pressures have been good and at home.  He will have a follow up on Dec 30.      Expected Outcomes  Short: Continue to keep eye on blood sugars.  Long: Continue to work on risk factor modifications.        ITP Comments: ITP Comments    Row Name 08/18/17 1444 08/26/17 1025 09/14/17 0554 09/14/17 0935 09/20/17 1409   ITP Comments  Med Review Completed. Initial ITP created. Diagnosis can be found in [facility] 07/18/17  After exercise was over, [name] felt weak and lightheaded.  Blood sugar was 285 but his blood pressure had dropped a little in the 100s.  He was given water and rested.  His blood pressure was 118/60 at discharge.   30 day review. Continue with ITP unless directed changes per Medical Director review.   [name] has missed a week.  Called to check on him.  Left message.  Out since last review.    Row Name 09/28/17 [phone] 10/05/17 1603 10/06/17 1128 10/12/17 0637     ITP Comments  Called to check on status of return.  [name] has been out since 09/07/17.  Left message on voicemail.  Called to check on patient.  Out since 11/28.  Left message.  [name] left a message to let us know that he has returned to  work.  Left him a message to verify that he wants to be discharged.   30 day review. Continue with ITP unless directed changes per Medical Director review.   NO visits since last review   No response to phone call       Comments:

## 2017-11-04 ENCOUNTER — Other Ambulatory Visit: Payer: Self-pay

## 2017-11-04 ENCOUNTER — Telehealth: Payer: Self-pay | Admitting: Cardiovascular Disease

## 2017-11-04 MED ORDER — CLOPIDOGREL BISULFATE 75 MG PO TABS: 75.0000 mg | ORAL_TABLET | Freq: Every day | ORAL | 5 refills | Status: AC

## 2017-11-04 NOTE — Telephone Encounter (Signed)
Please review for patient. The patient has been out of Brilinta 90 mg for one week. The patient has no insurance and can't afford the medication until he is on his wife's insurance and not sure if he can take a different medication. Please advise and contact patient for instructions.

## 2017-11-04 NOTE — Telephone Encounter (Signed)
Reviewed with Ward Givenshris Berge, NP, who advises: -plavix 75mg  qd with loading dose 300mg  today -continue 81mg  aspirin  Reviewed w/pt's wife Selena BattenKim.  [facility] [address]

## 2017-11-04 NOTE — Telephone Encounter (Signed)
Patient calling the office for samples of medication:   1.  What medication and dosage are you requesting samples for? Brilinta 90 MG - two times daily  2.  Are you currently out of this medication?  yes  Been out for about a week, wants to know if patient may have more samples until he can be added to wife's insurance

## 2017-11-04 NOTE — Telephone Encounter (Signed)
S/w pt's wife. Pt has been out of brilinta x 1 week. Pt is currently uninsured but will be added to wife's insurance plan in April. He has not mailed in patient assistance form for brilinta.  Per November OV notes: "Helped him fill out assistance paperwork for brilinta, samples provided Discussed changing to Plavix if he is unable to afford brilinta"  Will discuss with Ward Givenshris Berge, NP, for consideration of changing to plavix as it is [facility]30-day [facility]

## 2017-11-09 ENCOUNTER — Encounter: Payer: Self-pay | Admitting: *Deleted

## 2017-11-09 DIAGNOSIS — I213 ST elevation (STEMI) myocardial infarction of unspecified site: Secondary | ICD-10-CM

## 2017-11-09 DIAGNOSIS — Z955 Presence of coronary angioplasty implant and graft: Secondary | ICD-10-CM

## 2017-11-09 NOTE — Progress Notes (Signed)
Cardiac Individual Treatment Plan  Patient Details  Name: [name] MRN: [id number] Date of Birth: Nov 14, 1980 Referring Provider:     Cardiac Rehab from 08/18/2017 in [facility] Cardiac and Pulmonary Rehab  Referring Provider  Gollan      Initial Encounter Date:    Cardiac Rehab from 08/18/2017 in [facility] Cardiac and Pulmonary Rehab  Date  08/18/17  Referring Provider  Rockey Situ      Visit Diagnosis: ST elevation myocardial infarction (STEMI), unspecified artery [facility])  Status post coronary artery stent placement  Patient's Home Medications on Admission:  Current Outpatient Medications:  .  aspirin EC 81 MG tablet, Take 81 mg by mouth daily., Disp: , Rfl:  .  atorvastatin (LIPITOR) 80 MG tablet, Take 1 tablet (80 mg total) daily by mouth., Disp: 90 tablet, Rfl: 3 .  clopidogrel (PLAVIX) 75 MG tablet, Take 1 tablet (75 mg total) by mouth daily., Disp: 30 tablet, Rfl: 5 .  glucose blood test strip, Use as instructed, Disp: 100 each, Rfl: 12 .  insulin aspart protamine- aspart (NOVOLOG MIX 70/30) (70-30) 100 UNIT/ML injection, Inject 15 Units into the skin 3 (three) times daily., Disp: , Rfl:  .  losartan (COZAAR) 25 MG tablet, Take 1 tablet (25 mg total) at bedtime by mouth., Disp: 90 tablet, Rfl: 4 .  metoprolol tartrate (LOPRESSOR) 25 MG tablet, Take 1 tablet (25 mg total) 2 (two) times daily by mouth., Disp: 180 tablet, Rfl: 4 .  Multiple Vitamin (MULTIVITAMIN) tablet, Take 2 tablets by mouth daily. , Disp: , Rfl:   Past Medical History: Past Medical History:  Diagnosis Date  . Coronary atherosclerosis of artery bypass graft    a. 10/2006 s/p PCI/stenting of LCX;  b. 02/2007 s/p CABG x 4 @ age 28 (LIMA->LAD, RIMA->Diag, VG->OM, VG->RPDA;  c. 06/2017 Lateral STEMI/PCI: LM 50, LAD 100ost, 37m RI small, LCX 95ost/p (PTCA - 2.549mballoon), 70p/m ISR, OM2 75, RCA 100p/m, VG->RPDA 25, VG->OM2 80ost (3.0x15 Sierra DES), RIMA->Diag 100, LIMA->LAD nl.  . HFrEF (heart [place] with [place]  fraction) [place]   a. 07/2017 Echo: EF 35-40%  . HLD (hyperlipidemia)    poorly controlled  . HTN (hypertension)   . Hx-TIA (transient ischemic attack) 2009   possible-([facility]) Normal MRI of brain and MRA of the head and neck  . Ischemic cardiomyopathy    a. 07/2017 Echo: EF 35-40%, apicalanteroseptal and apical AK, GR2 DD, mildly to mod reduced RV fxn.  . Kidney stone   . Noncompliance   . Obesity, unspecified   . 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 Dx 2002 - poorly controlled in setting of noncompliance;  b. 07/2017 HbA1c 12.6.    Tobacco Use: Social History   Tobacco Use  Smoking Status Former Smoker  . Packs/day: 2.00  . Years: 14.00  . Pack years: 28.00  . Types: Cigarettes  . Last attempt to quit: 10/11/2004  . Years since quitting: 13.0  Smokeless Tobacco Former [employer] developer. Types: Chew, Snuff  Tobacco Comment   used to smoke 1 1/2 ppd, quit 2006    Labs: Recent Review Flowsheet Data    Labs for ITP Cardiac and Pulmonary Rehab Latest Ref Rng & Units 05/30/2015 06/02/2015 09/29/2015 07/10/2017 07/11/2017   Cholestrol 0 - 200 mg/dL - - - 253(H) 217(H)   LDLCALC 0 - 99 mg/dL - - - 190(H) 159(H)   LDLDIRECT mg/dL - - - - -   HDL >40 mg/dL - - - 33(L) 24(L)  Trlycerides <150 mg/dL - - - 148 170(H)   Hemoglobin A1c 4.8 - 5.6 % - 12.0(H) 11.4(H) - 12.6(H)   PHART 7.350 - 7.450 - - - - -   PCO2ART 35.0 - 45.0 mmHg - - - - -   HCO3 21.0 - 28.0 mEq/L 24.3 - - - -   TCO2 0 - 100 mmol/L - - - - -   ACIDBASEDEF 0.0 - 2.0 mmol/L 1.3 - - - -   O2SAT % - - - - -       Exercise Target Goals:    Exercise Program Goal: Individual exercise prescription set using results from initial 6 min walk test and THRR while considering  patient's activity barriers and safety.   Exercise Prescription Goal: Initial exercise prescription builds to 30-45 minutes a day of aerobic activity, 2-3 days per week.  Home exercise guidelines will be given to patient during program  as part of exercise prescription that the participant will acknowledge.  Activity Barriers & Risk Stratification: Activity Barriers & Cardiac Risk Stratification - 08/18/17 1459      Activity Barriers & Cardiac Risk Stratification   Activity Barriers  Back Problems;Chest Pain/Angina;Shortness of Breath    Cardiac Risk Stratification  High       6 Minute Walk: 6 Minute Walk    Row Name 08/18/17 1417         6 Minute Walk   Distance  1225 feet     Walk Time  6 minutes     # of Rest Breaks  0     MPH  2.32     METS  4.23     RPE  11     Perceived Dyspnea   2     VO2 Peak  14.8     Symptoms  No     Resting HR  77 bpm     Resting BP  112/66     Resting Oxygen Saturation   100 %     Exercise Oxygen Saturation  during 6 min walk  100 %     Max Ex. HR  101 bpm     Max Ex. BP  124/66     2 Minute Post BP  114/66        Oxygen Initial Assessment:   Oxygen Re-Evaluation:   Oxygen Discharge (Final Oxygen Re-Evaluation):   Initial Exercise Prescription: Initial Exercise Prescription - 08/18/17 1400      Date of Initial Exercise RX and Referring Provider   Date  08/18/17    Referring Provider  Gollan      Treadmill   MPH  2.5    Grade  2    Minutes  15    METs  3.6      Recumbant Bike   Level  3    RPM  60    Watts  70    Minutes  15    METs  4.2      REL-XR   Level  3    Watts  70    Speed  50    Minutes  15    METs  4.2      Prescription Details   Frequency (times per week)  3    Duration  Progress to 45 minutes of aerobic exercise without signs/symptoms of physical distress      Intensity   THRR 40-80% of Max Heartrate  119-161    Ratings of Perceived Exertion  11-13  Perceived Dyspnea  0-4      Resistance Training   Training Prescription  Yes    Weight  4 lbs    Reps  10-15       Perform Capillary Blood Glucose checks as needed.  Exercise Prescription Changes: Exercise Prescription Changes    Row Name 08/18/17 1400 08/23/17 1400  08/24/17 0800 09/08/17 1300       Response to Exercise   Blood Pressure (Admit)  112/66  114/66  -  134/72    Blood Pressure (Exercise)  124/66  134/74  -  124/70    Blood Pressure (Exit)  114/66  138/82  -  122/74    Heart Rate (Admit)  78 bpm  60 bpm  -  78 bpm    Heart Rate (Exercise)  101 bpm  124 bpm  -  101 bpm    Heart Rate (Exit)  90 bpm  89 bpm  -  77 bpm    Rating of Perceived Exertion (Exercise)  11  18  -  15    Symptoms  -  fatigue, chest soreness 4/10   -  fatigue    Comments  -  first full day of exercise  -  -    Duration  -  Progress to 45 minutes of aerobic exercise without signs/symptoms of physical distress  -  Continue with 45 min of aerobic exercise without signs/symptoms of physical distress.    Intensity  -  THRR unchanged  -  THRR unchanged      Progression   Progression  -  Continue to progress workloads to maintain intensity without signs/symptoms of physical distress.  -  Continue to progress workloads to maintain intensity without signs/symptoms of physical distress.    Average METs  -  3.11  -  3.87      Resistance Training   Training Prescription  -  Yes  -  Yes    Weight  -  - with [name] during weights today  -  4 lbs    Reps  -  -  -  10-15      Interval Training   Interval Training  -  No  -  No      Treadmill   MPH  -  2.5  -  2.5    Grade  -  2  -  2    Minutes  -  15  -  15    METs  -  3.6  -  3.6      Recumbant Bike   Level  -  3  -  4    Watts  -  22  -  70    Minutes  -  15  -  15    METs  -  2.63  -  4.1      REL-XR   Level  -  -  -  3    Minutes  -  -  -  15    METs  -  -  -  3.9      Home Exercise Plan   Plans to continue exercise at  -  -  Home (comment) walking at home  Home (comment) walking at home    Frequency  -  -  Add 2 additional days to program exercise sessions.  Add 2 additional days to program exercise sessions.    Initial Home Exercises Provided  -  -  08/24/17  08/24/17       Exercise Comments: Exercise  Comments    Row Name 08/22/17 0746 08/26/17 1025         Exercise Comments  First full day of exercise!  Patient was oriented to gym and equipment including functions, settings, policies, and procedures.  Patient's individual exercise prescription and treatment plan were reviewed.  All starting workloads were established based on the results of the 6 minute walk test done at initial orientation visit.  The plan for exercise progression was also introduced and progression will be customized based on patient's performance and goals.  After exercise was over, [name] felt weak and lightheaded.  Blood sugar was 285 but his blood pressure had dropped a little in the 100s.  He was given water and rested.  His blood pressure was 118/60 at discharge.We are also working the [employer] to fund his new shoes that he got fitted for.          Exercise Goals and Review: Exercise Goals    Row Name 08/18/17 1416             Exercise Goals   Increase Physical Activity  Yes       Intervention  Provide advice, education, support and counseling about physical activity/exercise needs.;Develop an individualized exercise prescription for aerobic and resistive training based on initial evaluation findings, risk stratification, comorbidities and participant's personal goals.       Expected Outcomes  Achievement of increased cardiorespiratory fitness and enhanced flexibility, muscular endurance and strength shown through measurements of functional capacity and personal statement of participant.       Increase Strength and Stamina  Yes       Intervention  Provide advice, education, support and counseling about physical activity/exercise needs.;Develop an individualized exercise prescription for aerobic and resistive training based on initial evaluation findings, risk stratification, comorbidities and participant's personal goals.       Expected Outcomes  Achievement of increased cardiorespiratory fitness and enhanced  flexibility, muscular endurance and strength shown through measurements of functional capacity and personal statement of participant.       Able to understand and use rate of perceived exertion (RPE) scale  Yes       Intervention  Provide education and explanation on how to use RPE scale       Expected Outcomes  Short Term: Able to use RPE daily in rehab to express subjective intensity level;Long Term:  Able to use RPE to guide intensity level when exercising independently       Able to understand and use Dyspnea scale  Yes       Intervention  Provide education and explanation on how to use Dyspnea scale       Expected Outcomes  Short Term: Able to use Dyspnea scale daily in rehab to express subjective sense of shortness of breath during exertion;Long Term: Able to use Dyspnea scale to guide intensity level when exercising independently       Knowledge and understanding of Target Heart Rate Range (THRR)  Yes       Intervention  Provide education and explanation of THRR including how the numbers were predicted and where they are located for reference       Expected Outcomes  Short Term: Able to state/look up THRR;Long Term: Able to use THRR to govern intensity when exercising independently;Short Term: Able to use daily as guideline for intensity in rehab       Able to check [name] independently  Yes  Intervention  Provide education and demonstration on how to check [name] in carotid and radial arteries.;Review the importance of being able to check your own [name] for safety during independent exercise       Expected Outcomes  Short Term: Able to explain why [name] checking is important during independent exercise;Long Term: Able to check [name] independently and accurately       Understanding of Exercise Prescription  Yes       Intervention  Provide education, explanation, and written materials on patient's individual exercise prescription       Expected Outcomes  Short Term: Able to explain program  exercise prescription;Long Term: Able to explain home exercise prescription to exercise independently          Exercise Goals Re-Evaluation : Exercise Goals Re-Evaluation    Row Name 08/22/17 0746 08/24/17 0846 09/07/17 0835 09/20/17 1409 10/05/17 1603     Exercise Goal Re-Evaluation   Exercise Goals Review  Increase Physical Activity;Increase Strength and Stamina;Able to understand and use rate of perceived exertion (RPE) scale;Knowledge and understanding of Target Heart Rate Range (THRR);Understanding of Exercise Prescription  Increase Physical Activity;Increase Strength and Stamina  Increase Physical Activity;Increase Strength and Stamina;Understanding of Exercise Prescription  -  -   Comments  Reviewed RPE scale, THR and program prescription with pt today.  Pt voiced understanding and was given a copy of goals to take home.   Reviewed home exercise with pt today.  Pt plans to walk 2 extra days at home for exercise.  Reviewed THR, [name], RPE, sign and symptoms, NTG use, and when to call 911 or MD.  Also discussed weather considerations and indoor options.  Pt voiced understanding. Looking into getting him new shoes and help with his medications through the foundation.  [name] is doing well in rehab.  He is getting stronger and has more stamina.  He does not like getting up so early to exercise.  He has started walking more at home and is up to 19mn every day.  He has gone to get fitted for new shoes and is just waiting for approval to pick them up.  Out since last review  Out since last review   Expected Outcomes  Short: Use RPE daily to regulate intensity.  Long: Follow program prescription in THR.  Short: add 2 days of walking at home. Long: maintain a home exercise routine.  Short: Get new shoes.  Long: Continue to increase time at home to 30 min.  -  -      Discharge Exercise Prescription (Final Exercise Prescription Changes): Exercise Prescription Changes - 09/08/17 1300      Response to  Exercise   Blood Pressure (Admit)  134/72    Blood Pressure (Exercise)  124/70    Blood Pressure (Exit)  122/74    Heart Rate (Admit)  78 bpm    Heart Rate (Exercise)  101 bpm    Heart Rate (Exit)  77 bpm    Rating of Perceived Exertion (Exercise)  15    Symptoms  fatigue    Duration  Continue with 45 min of aerobic exercise without signs/symptoms of physical distress.    Intensity  THRR unchanged      Progression   Progression  Continue to progress workloads to maintain intensity without signs/symptoms of physical distress.    Average METs  3.87      Resistance Training   Training Prescription  Yes    Weight  4 lbs    Reps  10-15      Interval Training   Interval Training  No      Treadmill   MPH  2.5    Grade  2    Minutes  15    METs  3.6      Recumbant Bike   Level  4    Watts  70    Minutes  15    METs  4.1      REL-XR   Level  3    Minutes  15    METs  3.9      Home Exercise Plan   Plans to continue exercise at  Home (comment) walking at home    Frequency  Add 2 additional days to program exercise sessions.    Initial Home Exercises Provided  08/24/17       Nutrition:  Target Goals: Understanding of nutrition guidelines, daily intake of sodium <1568m, cholesterol <2054m calories 30% from fat and 7% or less from saturated fats, daily to have 5 or more servings of fruits and vegetables.  Biometrics: Pre Biometrics - 08/18/17 1416      Pre Biometrics   Height  5' 11.5" (1.816 m)    Weight  239 lb 12.8 oz (108.8 kg)    Waist Circumference  40.5 inches    Hip Circumference  44.25 inches    Waist to Hip Ratio  0.92 %    BMI (Calculated)  32.98    Single Leg Stand  30 seconds        Nutrition Therapy Plan and Nutrition Goals:   Nutrition Assessments: Nutrition Assessments - 08/18/17 1451      MEDFICTS Scores   Pre Score  27       Nutrition Goals Re-Evaluation: Nutrition Goals Re-Evaluation    Row Name 09/07/17 0843              Goals   Nutrition Goal  Meet with nutritionist.       Comment  [name] would like to meet with dietician but would like to meet in class based off his home schedule.  We will get [name] meet with him during class.  He would like to focus on weight loss and new recipes.        Expected Outcome  Short: Meet with nutritionist.  Long; Stick with heart healthy.   (Significant)           Nutrition Goals Discharge (Final Nutrition Goals Re-Evaluation): Nutrition Goals Re-Evaluation - 09/07/17 0843      Goals   Nutrition Goal  Meet with nutritionist.    Comment  Anival would like to meet with dietician but would like to meet in class based off his home schedule.  We will get [name] meet with him during class.  He would like to focus on weight loss and new recipes.     Expected Outcome  Short: Meet with nutritionist.  Long; Stick with heart healthy.   (Significant)        Psychosocial: Target Goals: Acknowledge presence or absence of significant depression and/or stress, maximize coping skills, provide positive support system. Participant is able to verbalize types and ability to use techniques and skills needed for reducing stress and depression.   Initial Review & Psychosocial Screening: Initial Psych Review & Screening - 08/18/17 1451      Initial Review   Current issues with  History of Depression;Current Sleep Concerns;Current Stress Concerns;Current Anxiety/Panic    Source of Stress Concerns  Financial;Occupation;Unable  to participate in former interests or hobbies    Comments  Jaquel is under a lot of stress related to his disability being under investigation. It has been alternating between approved and revoked for almost a year now. He's been on disability for 10 years and has gotten used to that lifestyle, but now it is a struggle to make ends meet, especially when it comes to his medication. He says he is indifferent about the outcome of his health and annoyed with the lack of consistent help.  He currently stays at home and homeschools their 108 year old son.      Family Dynamics   Good Support System?  Yes Wife and son      Screening Interventions   Interventions  Yes;Encouraged to exercise;Program counselor consult    Expected Outcomes  Short Term goal: Utilizing psychosocial counselor, staff and physician to assist with identification of specific Stressors or current issues interfering with healing process. Setting desired goal for each stressor or current issue identified.;Long Term Goal: Stressors or current issues are controlled or eliminated.;Short Term goal: Identification and review with participant of any Quality of Life or Depression concerns found by scoring the questionnaire.;Long Term goal: The participant improves quality of Life and PHQ9 Scores as seen by post scores and/or verbalization of changes       Quality of Life Scores:  Quality of Life - 08/18/17 1455      Quality of Life Scores   Health/Function Pre  19.33 %    Socioeconomic Pre  17.5 %    Psych/Spiritual Pre  18.86 %    Family Pre  20 %    GLOBAL Pre  18.91 %      Scores of 19 and below usually indicate a poorer quality of life in these areas.  A difference of  2-3 points is a clinically meaningful difference.  A difference of 2-3 points in the total score of the Quality of Life Index has been associated with significant improvement in overall quality of life, self-image, physical symptoms, and general health in studies assessing change in quality of life.  PHQ-9: Recent Review Flowsheet Data    Depression screen [facility] 2/9 08/18/2017 03/18/2017   Decreased Interest 2 3   Down, Depressed, Hopeless 3 3   PHQ - 2 Score 5 6   Altered sleeping 3 3   Tired, decreased energy 3 3   Change in appetite 2 0   Feeling bad or failure about yourself  3 3   Trouble concentrating 3 3   Moving slowly or fidgety/restless 2 1   Suicidal thoughts 2  0   PHQ-9 Score 23 19   Difficult doing work/chores Very difficult  Somewhat difficult     Interpretation of Total Score  Total Score Depression Severity:  1-4 = Minimal depression, 5-9 = Mild depression, 10-14 = Moderate depression, 15-19 = Moderately severe depression, 20-27 = Severe depression   Psychosocial Evaluation and Intervention: Psychosocial Evaluation - 09/07/17 0927      Psychosocial Evaluation & Interventions   Comments  Counselor follow up with [name] today.  He was not here on Wednesday through the Thanksgiving Holiday or this past Monday.  He reports having traveled over the holiday and it was "stressful."  He reports seeing his Dr. who prescribed him an anti-depressant - not sure of the name - but he began taking it on 11/13.  Assessed Colyn reporting no negative side effects so far.  He feels "not as negative" and  has had some positive sleep nights but not consistently.  He continues to struggle with sleep and reports having a "restless mind" with racing thoughts that prevent sleep at times.  He denies thoughts or plan of self harm currently.  He is to follow up with his Dr. the end of December re: the medication and sleep concerns.  Counselor reminded him if his thoughts got worse or more negative to call his Dr. immediately - and he agreed to do so.  Counselor will continue to follow.     Expected Outcomes  Matthan will continue to work out consistently and take his medication for mood and sleep as prescribed.  He will call the Dr. if his mood worsens or he has thoughts of self-harm.  Counselor will continue to follow.        Psychosocial Re-Evaluation: Psychosocial Re-Evaluation    Row Name 08/24/17 [phone] 08/26/17 1026 09/07/17 0845         Psychosocial Re-Evaluation   Current issues with  Current Stress Concerns  Current Stress Concerns  Current Stress Concerns     Comments  Aarnav was not able to exercise today due to his blood sugar being too high.  He did take his medication today but he does sturggle finanacially to get his medications.  We  are going to look into trying to get him help with paying for his medications.  We are also going to try to get him some new shoes for exercise.  Also talked to [name] today about the Medication Management clinic to get help to pay for his medicaitons. He will get in contact with them.  We are also working the [employer] to fund his new shoes that he got fitted for.   [name] was able to afford his meds this month but will still work with medication clinic.   He is waiting for shoes to get approved.  The best part of rehab he is getting better with his strength and stamina.  He also had an interview and got the job, but turned it down.  We talked about vocational rehab and he is interested.     Expected Outcomes  Short: Go get fitted for new shoes.  Long: Find some financial help for his medications.   Short: Contact Medication Clinic  Long: Get help financially for medications.  Short: Complete vocational rehab papers.  Long: Continue to work on Radiographer, therapeutic.      Interventions  Encouraged to attend Cardiac Rehabilitation for the exercise;Stress management education  Stress management education;Encouraged to attend Cardiac Rehabilitation for the exercise  Encouraged to attend Cardiac Rehabilitation for the exercise;Stress management education     Continue Psychosocial Services   Follow up required by counselor  -  Follow up required by counselor       Initial Review   Source of Stress Concerns  Financial  Financial  Financial;Unable to perform yard/household activities;Unable to participate in former interests or hobbies        Psychosocial Discharge (Final Psychosocial Re-Evaluation): Psychosocial Re-Evaluation - 09/07/17 0845      Psychosocial Re-Evaluation   Current issues with  Current Stress Concerns    Comments  [name] was able to afford his meds this month but will still work with medication clinic.   He is waiting for shoes to get approved.  The best part of rehab he is getting better with his  strength and stamina.  He also had an interview and got the job, but turned it down.  We talked about vocational rehab and he is interested.    Expected Outcomes  Short: Complete vocational rehab papers.  Long: Continue to work on Radiographer, therapeutic.     Interventions  Encouraged to attend Cardiac Rehabilitation for the exercise;Stress management education    Continue Psychosocial Services   Follow up required by counselor      Initial Review   Source of Stress Concerns  Financial;Unable to perform yard/household activities;Unable to participate in former interests or hobbies       Vocational Rehabilitation: Provide vocational rehab assistance to qualifying candidates.   Vocational Rehab Evaluation & Intervention: Vocational Rehab - 09/07/17 7340      Initial Vocational Rehab Evaluation & Intervention   Assessment shows need for Vocational Rehabilitation  Yes    Vocational Rehab Packet given to patient  09/07/17    Documents faxed to [facility] Dept of Vocational Rehabilitation  09/07/17       Education: Education Goals: Education classes will be provided on a variety of topics geared toward better understanding of heart health and risk factor modification. Participant will state understanding/return demonstration of topics presented as noted by education test scores.  Learning Barriers/Preferences: Learning Barriers/Preferences - 08/18/17 1457      Learning Barriers/Preferences   Learning Barriers  None    Learning Preferences  None       Education Topics:  AED/CPR: - Group verbal and written instruction with the use of models to demonstrate the basic use of the AED with the basic ABC's of resuscitation.   General Nutrition Guidelines/Fats and Fiber: -Group instruction provided by verbal, written material, models and posters to present the general guidelines for heart healthy nutrition. Gives an explanation and review of dietary fats and fiber.   Controlling Sodium/Reading Food  Labels: -Group verbal and written material supporting the discussion of sodium use in heart healthy nutrition. Review and explanation with models, verbal and written materials for utilization of the food label.   Exercise Physiology & General Exercise Guidelines: - Group verbal and written instruction with models to review the exercise physiology of the cardiovascular system and associated critical values. Provides general exercise guidelines with specific guidelines to those with heart or lung disease.    Aerobic Exercise & Resistance Training: - Gives group verbal and written instruction on the various components of exercise. Focuses on aerobic and resistive training programs and the benefits of this training and how to safely progress through these programs..   Flexibility, Balance, Mind/Body Relaxation: Provides group verbal/written instruction on the benefits of flexibility and balance training, including mind/body exercise modes such as yoga, pilates and tai chi.  Demonstration and skill practice provided.   Stress and Anxiety: - Provides group verbal and written instruction about the health risks of elevated stress and causes of high stress.  Discuss the correlation between heart/lung disease and anxiety and treatment options. Review healthy ways to manage with stress and anxiety.   Cardiac Rehab from 09/07/2017 in [facility] Cardiac and Pulmonary Rehab  Date  08/24/17  Educator  [facility]  Instruction Review Code  1- Verbalizes Understanding      Depression: - Provides group verbal and written instruction on the correlation between heart/lung disease and depressed mood, treatment options, and the stigmas associated with seeking treatment.   Anatomy & Physiology of the Heart: - Group verbal and written instruction and models provide basic cardiac anatomy and physiology, with the coronary electrical and arterial systems. Review of Valvular disease and Heart Failure   Cardiac Rehab from  09/07/2017 in [facility] Cardiac and Pulmonary Rehab  Date  08/22/17  Educator  CE  Instruction Review Code  1- Verbalizes Understanding      Cardiac Procedures: - Group verbal and written instruction to review commonly prescribed medications for heart disease. Reviews the medication, class of the drug, and side effects. Includes the steps to properly store meds and maintain the prescription regimen. (beta blockers and nitrates)   Cardiac Rehab from 09/07/2017 in [facility] Cardiac and Pulmonary Rehab  Date  08/29/17  Educator  CE  Instruction Review Code  1- Verbalizes Understanding      Cardiac Medications I: - Group verbal and written instruction to review commonly prescribed medications for heart disease. Reviews the medication, class of the drug, and side effects. Includes the steps to properly store meds and maintain the prescription regimen.   Cardiac Medications II: -Group verbal and written instruction to review commonly prescribed medications for heart disease. Reviews the medication, class of the drug, and side effects. (all other drug classes)    Go Sex-Intimacy & Heart Disease, Get SMART - Goal Setting: - Group verbal and written instruction through game format to discuss heart disease and the return to sexual intimacy. Provides group verbal and written material to discuss and apply goal setting through the application of the S.M.A.R.T. Method.   Cardiac Rehab from 09/07/2017 in [facility] Cardiac and Pulmonary Rehab  Date  08/29/17  Educator  CE  Instruction Review Code  1- Verbalizes Understanding      Other Matters of the Heart: - Provides group verbal, written materials and models to describe Stable Angina and Peripheral Artery. Includes description of the disease process and treatment options available to the cardiac patient.   Cardiac Rehab from 09/07/2017 in [facility] Cardiac and Pulmonary Rehab  Date  08/22/17  Educator  CE  Instruction Review Code  1- Verbalizes Understanding       Exercise & Equipment Safety: - Individual verbal instruction and demonstration of equipment use and safety with use of the equipment.   Cardiac Rehab from 09/07/2017 in [facility] Cardiac and Pulmonary Rehab  Date  08/18/17  Educator  [facility]  Instruction Review Code  1- Verbalizes Understanding      Infection Prevention: - Provides verbal and written material to individual with discussion of infection control including proper hand washing and proper equipment cleaning during exercise session.   Cardiac Rehab from 09/07/2017 in [facility] Cardiac and Pulmonary Rehab  Date  08/18/17  Educator  [facility]  Instruction Review Code  1- Verbalizes Understanding      Falls Prevention: - Provides verbal and written material to individual with discussion of falls prevention and safety.   Cardiac Rehab from 09/07/2017 in [facility] Cardiac and Pulmonary Rehab  Date  08/18/17  Educator  [facility]  Instruction Review Code  1- Verbalizes Understanding      Diabetes: - Individual verbal and written instruction to review signs/symptoms of diabetes, desired ranges of glucose level fasting, after meals and with exercise. Acknowledge that pre and post exercise glucose checks will be done for 3 sessions at entry of program.   Cardiac Rehab from 09/07/2017 in [facility] Cardiac and Pulmonary Rehab  Date  08/18/17  Educator  [facility]  Instruction Review Code  1- Verbalizes Understanding      Know Your Numbers and Risk Factors: -Group verbal and written instruction about important numbers in your health.  Discussion of what are risk factors and how they play a role in the disease process.  Review of Cholesterol,  Blood Pressure, Diabetes, and BMI and the role they play in your overall health.   Sleep Hygiene: -Provides group verbal and written instruction about how sleep can affect your health.  Define sleep hygiene, discuss sleep cycles and impact of sleep habits. Review good sleep hygiene tips.    Other: -Provides group and verbal  instruction on various topics (see comments)   Knowledge Questionnaire Score: Knowledge Questionnaire Score - 08/18/17 1457      Knowledge Questionnaire Score   Pre Score  12/28 correct answers reviewed with [name]        Core Components/Risk Factors/Patient Goals at Admission: Personal Goals and Risk Factors at Admission - 08/18/17 1447      Core Components/Risk Factors/Patient Goals on Admission    Weight Management  Yes;Weight Loss    Intervention  Weight Management: Develop a combined nutrition and exercise program designed to reach desired caloric intake, while maintaining appropriate intake of nutrient and fiber, sodium and fats, and appropriate energy expenditure required for the weight goal.;Weight Management: Provide education and appropriate resources to help participant work on and attain dietary goals.;Weight Management/Obesity: Establish reasonable short term and long term weight goals.    Admit Weight  239 lb 12.8 oz (108.8 kg)    Goal Weight: Short Term  234 lb (106.1 kg)    Goal Weight: Long Term  200 lb (90.7 kg)    Expected Outcomes  Short Term: Continue to assess and modify interventions until short term weight is achieved;Long Term: Adherence to nutrition and physical activity/exercise program aimed toward attainment of established weight goal;Weight Maintenance: Understanding of the daily nutrition guidelines, which includes 25-35% calories from fat, 7% or less cal from saturated fats, less than 244m cholesterol, less than 1.5gm of sodium, & 5 or more servings of fruits and vegetables daily;Weight Loss: Understanding of general recommendations for a balanced deficit meal plan, which promotes 1-2 lb weight loss per week and includes a negative energy balance of [phone] kcal/d;Understanding recommendations for meals to include 15-35% energy as protein, 25-35% energy from fat, 35-60% energy from carbohydrates, less than 2060mof dietary cholesterol, 20-35 gm of total fiber  daily;Understanding of distribution of calorie intake throughout the day with the consumption of 4-5 meals/snacks    Diabetes  Yes    Intervention  Provide education about signs/symptoms and action to take for hypo/hyperglycemia.;Provide education about proper nutrition, including hydration, and aerobic/resistive exercise prescription along with prescribed medications to achieve blood glucose in normal ranges: Fasting glucose 65-99 mg/dL    Expected Outcomes  Long Term: Attainment of HbA1C < 7%.;Short Term: Participant verbalizes understanding of the signs/symptoms and immediate care of hyper/hypoglycemia, proper foot care and importance of medication, aerobic/resistive exercise and nutrition plan for blood glucose control.    Hypertension  Yes    Intervention  Provide education on lifestyle modifcations including regular physical activity/exercise, weight management, moderate sodium restriction and increased consumption of fresh fruit, vegetables, and low fat dairy, alcohol moderation, and smoking cessation.;Monitor prescription use compliance.    Expected Outcomes  Short Term: Continued assessment and intervention until BP is < 140/9075mG in hypertensive participants. < 130/29m71m in hypertensive participants with diabetes, heart failure or chronic kidney disease.;Long Term: Maintenance of blood pressure at goal levels.    Stress  Yes [name] a lot of financial stress related to his disability being under investigation. Financially, his family is struggling even with his disabily money and is having issues getting medication.     Intervention  Offer individual  and/or small group education and counseling on adjustment to heart disease, stress management and health-related lifestyle change. Teach and support self-help strategies.;Refer participants experiencing significant psychosocial distress to appropriate mental health specialists for further evaluation and treatment. When possible, include family  members and significant others in education/counseling sessions.    Expected Outcomes  Short Term: Participant demonstrates changes in health-related behavior, relaxation and other stress management skills, ability to obtain effective social support, and compliance with psychotropic medications if prescribed.;Long Term: Emotional wellbeing is indicated by absence of clinically significant psychosocial distress or social isolation.       Core Components/Risk Factors/Patient Goals Review:  Goals and Risk Factor Review    Row Name 09/07/17 0837             Core Components/Risk Factors/Patient Goals Review   Personal Goals Review  Weight Management/Obesity;Hypertension;Lipids;Diabetes       Review  [name] has been doing well in rehab.  His weight is up a little after the holiday eating.  He was able to get his medications this month and they are working.  His blood sugars continue to be all over the map but today was the lowest it has been in months.  [name]'s blood pressures have been good and at home.  He will have a follow up on Dec 30.         Expected Outcomes  Short: Continue to keep eye on blood sugars.  Long: Continue to work on risk factor modifications.           Core Components/Risk Factors/Patient Goals at Discharge (Final Review):  Goals and Risk Factor Review - 09/07/17 0837      Core Components/Risk Factors/Patient Goals Review   Personal Goals Review  Weight Management/Obesity;Hypertension;Lipids;Diabetes    Review  [name] has been doing well in rehab.  His weight is up a little after the holiday eating.  He was able to get his medications this month and they are working.  His blood sugars continue to be all over the map but today was the lowest it has been in months.  [name]'s blood pressures have been good and at home.  He will have a follow up on Dec 30.      Expected Outcomes  Short: Continue to keep eye on blood sugars.  Long: Continue to work on risk factor modifications.         ITP Comments: ITP Comments    Row Name 08/18/17 1444 08/26/17 1025 09/14/17 0554 09/14/17 0935 09/20/17 1409   ITP Comments  Med Review Completed. Initial ITP created. Diagnosis can be found in [facility] 07/18/17  After exercise was over, [name] felt weak and lightheaded.  Blood sugar was 285 but his blood pressure had dropped a little in the 100s.  He was given water and rested.  His blood pressure was 118/60 at discharge.   30 day review. Continue with ITP unless directed changes per Medical Director review.   Anais has missed a week.  Called to check on him.  Left message.  Out since last review.    [name] Name 09/28/17 [phone] 10/05/17 1603 10/06/17 1128 10/12/17 0637 11/09/17 0654   ITP Comments  Called to check on status of return.  [name] has been out since 09/07/17.  Left message on voicemail.  Called to check on patient.  Out since 11/28.  Left message.  [name] left a message to let us know that he has returned to work.  Left him a message to verify that  he wants to be discharged.   30 day review. Continue with ITP unless directed changes per Medical Director review.   NO visits since last review   No response to phone call  30 Day review. Continue with ITP unless directed changes per Medical Director review.  discharged returned to work      Comments:

## 2018-01-16 ENCOUNTER — Encounter: Payer: Self-pay | Admitting: Emergency Medicine

## 2018-01-16 ENCOUNTER — Emergency Department: Payer: BLUE CROSS/BLUE SHIELD

## 2018-01-16 ENCOUNTER — Observation Stay
Admission: EM | Admit: 2018-01-16 | Discharge: 2018-01-17 | Disposition: A | Discharge status: Home / Self Care | Payer: BLUE CROSS/BLUE SHIELD | Attending: Internal Medicine | Admitting: Internal Medicine

## 2018-01-16 DIAGNOSIS — I255 Ischemic cardiomyopathy: Secondary | ICD-10-CM | POA: Insufficient documentation

## 2018-01-16 DIAGNOSIS — I25810 Atherosclerosis of coronary artery bypass graft(s) without angina pectoris: Secondary | ICD-10-CM | POA: Diagnosis present

## 2018-01-16 DIAGNOSIS — E782 Mixed hyperlipidemia: Secondary | ICD-10-CM | POA: Diagnosis present

## 2018-01-16 DIAGNOSIS — E1165 Type 2 diabetes mellitus with hyperglycemia: Secondary | ICD-10-CM | POA: Diagnosis present

## 2018-01-16 DIAGNOSIS — Z888 Allergy status to other drugs, medicaments and biological substances status: Secondary | ICD-10-CM | POA: Diagnosis not present

## 2018-01-16 DIAGNOSIS — I110 Hypertensive heart disease with heart failure: Secondary | ICD-10-CM | POA: Diagnosis not present

## 2018-01-16 DIAGNOSIS — I252 Old myocardial infarction: Secondary | ICD-10-CM | POA: Diagnosis not present

## 2018-01-16 DIAGNOSIS — Z6831 Body mass index (BMI) 31.0-31.9, adult: Secondary | ICD-10-CM | POA: Diagnosis not present

## 2018-01-16 DIAGNOSIS — I5042 Chronic combined systolic (congestive) and diastolic (congestive) heart failure: Secondary | ICD-10-CM | POA: Diagnosis present

## 2018-01-16 DIAGNOSIS — Z8249 Family history of ischemic heart disease and other diseases of the circulatory system: Secondary | ICD-10-CM | POA: Diagnosis not present

## 2018-01-16 DIAGNOSIS — R079 Chest pain, unspecified: Secondary | ICD-10-CM | POA: Diagnosis present

## 2018-01-16 DIAGNOSIS — Z7982 Long term (current) use of aspirin: Secondary | ICD-10-CM | POA: Diagnosis not present

## 2018-01-16 DIAGNOSIS — E11311 Type 2 diabetes mellitus with unspecified diabetic retinopathy with macular edema: Secondary | ICD-10-CM | POA: Diagnosis not present

## 2018-01-16 DIAGNOSIS — Z87891 Personal history of nicotine dependence: Secondary | ICD-10-CM | POA: Insufficient documentation

## 2018-01-16 DIAGNOSIS — E669 Obesity, unspecified: Secondary | ICD-10-CM | POA: Diagnosis not present

## 2018-01-16 DIAGNOSIS — Z7902 Long term (current) use of antithrombotics/antiplatelets: Secondary | ICD-10-CM | POA: Insufficient documentation

## 2018-01-16 DIAGNOSIS — Z9114 Patient's other noncompliance with medication regimen: Secondary | ICD-10-CM | POA: Insufficient documentation

## 2018-01-16 DIAGNOSIS — Z794 Long term (current) use of insulin: Secondary | ICD-10-CM | POA: Insufficient documentation

## 2018-01-16 DIAGNOSIS — Z8673 Personal history of transient ischemic attack (TIA), and cerebral infarction without residual deficits: Secondary | ICD-10-CM | POA: Diagnosis not present

## 2018-01-16 DIAGNOSIS — I10 Essential (primary) hypertension: Secondary | ICD-10-CM | POA: Diagnosis present

## 2018-01-16 DIAGNOSIS — F4322 Adjustment disorder with anxiety: Secondary | ICD-10-CM | POA: Diagnosis not present

## 2018-01-16 DIAGNOSIS — E118 Type 2 diabetes mellitus with unspecified complications: Secondary | ICD-10-CM

## 2018-01-16 DIAGNOSIS — N410 Acute prostatitis: Secondary | ICD-10-CM | POA: Insufficient documentation

## 2018-01-16 DIAGNOSIS — Z955 Presence of coronary angioplasty implant and graft: Secondary | ICD-10-CM | POA: Diagnosis not present

## 2018-01-16 DIAGNOSIS — Z79899 Other long term (current) drug therapy: Secondary | ICD-10-CM | POA: Insufficient documentation

## 2018-01-16 DIAGNOSIS — Z801 Family history of malignant neoplasm of trachea, bronchus and lung: Secondary | ICD-10-CM | POA: Insufficient documentation

## 2018-01-16 DIAGNOSIS — IMO0002 Reserved for concepts with insufficient information to code with codable children: Secondary | ICD-10-CM | POA: Diagnosis present

## 2018-01-16 DIAGNOSIS — M5412 Radiculopathy, cervical region: Secondary | ICD-10-CM | POA: Diagnosis not present

## 2018-01-16 DIAGNOSIS — Z87442 Personal history of urinary calculi: Secondary | ICD-10-CM | POA: Diagnosis not present

## 2018-01-16 DIAGNOSIS — Z825 Family history of asthma and other chronic lower respiratory diseases: Secondary | ICD-10-CM | POA: Insufficient documentation

## 2018-01-16 LAB — BASIC METABOLIC PANEL
Anion gap: 7 (ref 5–15)
BUN: 15 mg/dL (ref 6–20)
CALCIUM: 8.4 mg/dL — AB (ref 8.9–10.3)
CO2: 23 mmol/L (ref 22–32)
CREATININE: 1.09 mg/dL (ref 0.61–1.24)
Chloride: 106 mmol/L (ref 101–111)
GFR calc non Af Amer: >60 mL/min (ref >60)
GLUCOSE: 435 mg/dL — AB (ref 65–99)
Potassium: 3.6 mmol/L (ref 3.5–5.1)
Sodium: 136 mmol/L (ref 135–145)

## 2018-01-16 LAB — CBC
HCT: 39.8 % — ABNORMAL LOW (ref 40.0–52.0)
Hemoglobin: 13.4 g/dL (ref 13.0–18.0)
MCH: 28.7 pg (ref 26.0–34.0)
MCHC: 33.6 g/dL (ref 32.0–36.0)
MCV: 85.5 fL (ref 80.0–100.0)
PLATELETS: 243 10*3/uL (ref 150–440)
RBC: 4.66 MIL/uL (ref 4.40–5.90)
RDW: 12.8 % (ref 11.5–14.5)
WBC: 5.4 10*3/uL (ref 3.8–10.6)

## 2018-01-16 LAB — GLUCOSE, CAPILLARY: Glucose-Capillary: 237 mg/dL — ABNORMAL HIGH (ref 65–99)

## 2018-01-16 LAB — TROPONIN I: Troponin I: <0.03 ng/mL (ref <0.03)

## 2018-01-16 MED ORDER — ACETAMINOPHEN 325 MG PO TABS: 650.0000 mg | ORAL_TABLET | Freq: Four times a day (QID) | ORAL | Status: DC | PRN

## 2018-01-16 MED ORDER — ONDANSETRON HCL 4 MG/2ML IJ SOLN: 4.0000 mg | Freq: Four times a day (QID) | INTRAMUSCULAR | Status: DC | PRN

## 2018-01-16 MED ORDER — METOPROLOL TARTRATE 25 MG PO TABS
25.0000 mg | ORAL_TABLET | Freq: Two times a day (BID) | ORAL | Status: DC
  Administered 2018-01-16 – 2018-01-17 (×2): 25 mg via ORAL
  Filled 2018-01-16 (×2): qty 1

## 2018-01-16 MED ORDER — INSULIN ASPART 100 UNIT/ML ~~LOC~~ SOLN
0.0000 [IU] | Freq: Four times a day (QID) | SUBCUTANEOUS | Status: DC
  Administered 2018-01-16 – 2018-01-17 (×3): 5 [IU] via SUBCUTANEOUS
  Filled 2018-01-16 (×3): qty 1

## 2018-01-16 MED ORDER — ONDANSETRON HCL 4 MG PO TABS: 4.0000 mg | ORAL_TABLET | Freq: Four times a day (QID) | ORAL | Status: DC | PRN

## 2018-01-16 MED ORDER — ASPIRIN 81 MG PO CHEW
324.0000 mg | CHEWABLE_TABLET | Freq: Once | ORAL | Status: AC
  Administered 2018-01-16: 324 mg via ORAL
  Filled 2018-01-16: qty 4

## 2018-01-16 MED ORDER — MORPHINE SULFATE (PF) 2 MG/ML IV SOLN: 2.0000 mg | INTRAVENOUS | Status: DC | PRN

## 2018-01-16 MED ORDER — MORPHINE SULFATE (PF) 4 MG/ML IV SOLN
4.0000 mg | Freq: Once | INTRAVENOUS | Status: AC
  Administered 2018-01-16: 4 mg via INTRAVENOUS
  Filled 2018-01-16: qty 1

## 2018-01-16 MED ORDER — ENOXAPARIN SODIUM 40 MG/0.4ML ~~LOC~~ SOLN
40.0000 mg | SUBCUTANEOUS | Status: DC
  Administered 2018-01-16: 40 mg via SUBCUTANEOUS
  Filled 2018-01-16: qty 0.4

## 2018-01-16 MED ORDER — OXYCODONE HCL 5 MG PO TABS: 5.0000 mg | ORAL_TABLET | ORAL | Status: DC | PRN

## 2018-01-16 MED ORDER — ACETAMINOPHEN 650 MG RE SUPP: 650.0000 mg | Freq: Four times a day (QID) | RECTAL | Status: DC | PRN

## 2018-01-16 MED ORDER — ASPIRIN EC 81 MG PO TBEC
81.0000 mg | DELAYED_RELEASE_TABLET | Freq: Every day | ORAL | Status: DC
Start: 2018-01-17 — End: 2018-01-17
  Administered 2018-01-17: 81 mg via ORAL
  Filled 2018-01-16: qty 1

## 2018-01-16 MED ORDER — ATORVASTATIN CALCIUM 20 MG PO TABS
80.0000 mg | ORAL_TABLET | Freq: Every day | ORAL | Status: DC
  Administered 2018-01-16: 80 mg via ORAL
  Filled 2018-01-16: qty 4

## 2018-01-16 MED ORDER — CLOPIDOGREL BISULFATE 75 MG PO TABS
75.0000 mg | ORAL_TABLET | Freq: Every day | ORAL | Status: DC
  Administered 2018-01-17: 75 mg via ORAL
  Filled 2018-01-16: qty 1

## 2018-01-16 MED ORDER — ONDANSETRON HCL 4 MG/2ML IJ SOLN
4.0000 mg | Freq: Once | INTRAMUSCULAR | Status: AC
  Administered 2018-01-16: 4 mg via INTRAVENOUS
  Filled 2018-01-16: qty 2

## 2018-01-16 NOTE — ED Triage Notes (Signed)
Pt reports cp since he woke up this am. Pt reports he also feels heaviness and in his arms and legs. Pt describes his legs as tingling and his arms ans numb. Pt reports as the time goes on he feels like it is harder to breathe.

## 2018-01-16 NOTE — ED Notes (Signed)
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

## 2018-01-16 NOTE — H&P (Signed)
[facility] - [place] at [facility]   PATIENT NAME: [name]    MR#:  [id number]  DATE OF BIRTH:  January 18, 1981  DATE OF ADMISSION:  01/16/2018  PRIMARY CARE PHYSICIAN: Marisue Ivan, MD   REQUESTING/REFERRING PHYSICIAN: Don Perking, MD  CHIEF COMPLAINT:   Chief Complaint  Patient presents with  . Chest Pain    HISTORY OF PRESENT ILLNESS:  [name]  is a 37 y.o. male who presents with chest pain.  Patient states that he woke up this morning with discomfort in his chest.  He went to work and throughout the day this discomfort progressed into pressure-like sensation with tingling down both arms and up into his face and lips.  He states the symptoms are very similar to prior symptoms he has had when he had a heart attack.  He finally came to ED for evaluation after his supervisor at work felt he looked pale.  Here in the ED his workup is been largely within normal limits, with 2 neg troponin values thus far and a reassuring EKG.  However, given his prior significant cardiac history at such young age hospitalist were called for admission for further evaluation.  PAST MEDICAL HISTORY:   Past Medical History:  Diagnosis Date  . Coronary atherosclerosis of artery bypass graft    a. 10/2006 s/p PCI/stenting of LCX;  b. 02/2007 s/p CABG x 4 @ age 28 (LIMA->LAD, RIMA->Diag, VG->OM, VG->RPDA;  c. 06/2017 Lateral STEMI/PCI: LM 50, LAD 100ost, 47m, RI small, LCX 95ost/p (PTCA - 2.45mm balloon), 70p/m ISR, OM2 75, RCA 100p/m, VG->RPDA 25, VG->OM2 80ost (3.0x15 Sierra DES), RIMA->Diag 100, LIMA->LAD nl.  . HFrEF (heart failure with reduced ejection fraction) (HCC)    a. 07/2017 Echo: EF 35-40%  . HLD (hyperlipidemia)    poorly controlled  . HTN (hypertension)   . Hx-TIA (transient ischemic attack) 2009   possible-([facility]) Normal MRI of brain and MRA of the head and neck  . Ischemic cardiomyopathy    a. 07/2017 Echo: EF 35-40%, apicalanteroseptal and apical AK, GR2 DD,  mildly to mod reduced RV fxn.  . Kidney stone   . Noncompliance   . Obesity, unspecified   . Uncontrolled diabetes mellitus with complications (HCC)    a. Dx 2002 - poorly controlled in setting of noncompliance;  b. 07/2017 HbA1c 12.6.     PAST SURGICAL HISTORY:   Past Surgical History:  Procedure Laterality Date  . CORONARY ARTERY BYPASS GRAFT  2007   x 4 (age 47)  . CORONARY BALLOON ANGIOPLASTY N/A 07/10/2017   Procedure: CORONARY BALLOON ANGIOPLASTY;  Surgeon: Corky Crafts, MD;  Location: [facility] INVASIVE CV LAB;  Service: Cardiovascular;  Laterality: N/A;  . CORONARY STENT INTERVENTION N/A 07/10/2017   Procedure: CORONARY STENT INTERVENTION;  Surgeon: Corky Crafts, MD;  Location: [facility] INVASIVE CV LAB;  Service: Cardiovascular;  Laterality: N/A;  . LEFT HEART CATH AND CORS/GRAFTS ANGIOGRAPHY N/A 07/10/2017   Procedure: LEFT HEART CATH AND CORS/GRAFTS ANGIOGRAPHY;  Surgeon: Corky Crafts, MD;  Location: [facility] INVASIVE CV LAB;  Service: Cardiovascular;  Laterality: N/A;     SOCIAL HISTORY:   Social History   Tobacco Use  . Smoking status: Former Smoker    Packs/day: 2.00    Years: 14.00    Pack years: 28.00    Types: Cigarettes    Last attempt to quit: 10/11/2004    Years since quitting: 13.2  . Smokeless tobacco: Former Neurosurgeon    Types: Chew, Snuff  . Tobacco  comment: used to smoke 1 1/2 ppd, quit 2006  Substance Use Topics  . Alcohol use: Yes    Alcohol/week: 1.2 oz    Types: 2 Cans of beer per week    Comment: 7-8 lite beers per month     FAMILY HISTORY:   Family History  Problem Relation Age of Onset  . Emphysema Father        + smoker  . COPD Father        Died age 38  . Coronary artery disease Maternal Grandfather   . Heart attack Maternal Grandfather   . Diabetes Maternal Grandfather   . Hypertension Maternal Grandfather   . Hyperlipidemia Maternal Grandfather   . Heart disease Maternal Grandfather        CAD  . Lung cancer Unknown         paternal uncles and aunts (all smokers)  . Coronary artery disease Unknown        premature-family history     DRUG ALLERGIES:   Allergies  Allergen Reactions  . Nitroglycerin Nausea And Vomiting    MEDICATIONS AT HOME:   Prior to Admission medications   Medication Sig Start Date End Date Taking? Authorizing Provider  aspirin EC 81 MG tablet Take 81 mg by mouth daily.   Yes [provider]  atorvastatin (LIPITOR) 80 MG tablet Take 1 tablet (80 mg total) daily by mouth. 08/22/17  Yes Gollan, Tollie Pizzaimothy J, MD  clopidogrel (PLAVIX) 75 MG tablet Take 1 tablet (75 mg total) by mouth daily. 11/04/17  Yes Creig HinesBerge, Christopher Ronald, NP  glucose blood test strip Use as instructed 07/19/17  Yes Sudini, Wardell HeathSrikar, MD  insulin aspart protamine- aspart (NOVOLOG MIX 70/30) (70-30) 100 UNIT/ML injection Inject 35 Units into the skin 2 (two) times daily.    Yes [provider]  metoprolol tartrate (LOPRESSOR) 25 MG tablet Take 1 tablet (25 mg total) 2 (two) times daily by mouth. 08/22/17  Yes Gollan, Tollie Pizzaimothy J, MD  losartan (COZAAR) 25 MG tablet Take 1 tablet (25 mg total) at bedtime by mouth. Patient not taking: Reported on 01/16/2018 08/22/17   Antonieta IbaGollan, Timothy J, MD    REVIEW OF SYSTEMS:  Review of Systems  Constitutional: Negative for chills, fever, malaise/fatigue and weight loss.  HENT: Negative for ear pain, hearing loss and tinnitus.   Eyes: Negative for blurred vision, double vision, pain and redness.  Respiratory: Negative for cough, hemoptysis and shortness of breath.   Cardiovascular: Positive for chest pain. Negative for palpitations, orthopnea and leg swelling.  Gastrointestinal: Negative for abdominal pain, constipation, diarrhea, nausea and vomiting.  Genitourinary: Negative for dysuria, frequency and hematuria.  Musculoskeletal: Negative for back pain, joint pain and neck pain.  Skin:       No acne, rash, or lesions  Neurological: Negative for dizziness, tremors, focal  weakness and weakness.  Endo/Heme/Allergies: Negative for polydipsia. Does not bruise/bleed easily.  Psychiatric/Behavioral: Negative for depression. The patient is not nervous/anxious and does not have insomnia.      VITAL SIGNS:   Vitals:   01/16/18 1344 01/16/18 1800 01/16/18 1830 01/16/18 1900  BP:  (!) 141/90 (!) 149/96 (!) 149/99  Pulse:  81 76 72  Resp:  12 14 11   Temp:      TempSrc:      SpO2:      Weight: 104.3 kg (230 lb)     Height: 5\' 11"  (1.803 m)      Wt Readings from Last 3 Encounters:  01/16/18 104.3  kg (230 lb)  08/22/17 108.7 kg (239 lb 12 oz)  08/18/17 108.8 kg (239 lb 12.8 oz)    PHYSICAL EXAMINATION:  Physical Exam  Vitals reviewed. Constitutional: He is oriented to person, place, and time. He appears well-developed and well-nourished. No distress.  HENT:  Head: Normocephalic and atraumatic.  Mouth/Throat: Oropharynx is clear and moist.  Eyes: Pupils are equal, round, and reactive to light. Conjunctivae and EOM are normal. No scleral icterus.  Neck: Normal range of motion. Neck supple. No JVD present. No thyromegaly present.  Cardiovascular: Normal rate, regular rhythm and intact distal pulses. Exam reveals no gallop and no friction rub.  No murmur heard. Respiratory: Effort normal and breath sounds normal. No respiratory distress. He has no wheezes. He has no rales.  GI: Soft. Bowel sounds are normal. He exhibits no distension. There is no tenderness.  Musculoskeletal: Normal range of motion. He exhibits no edema.  No arthritis, no gout  Lymphadenopathy:    He has no cervical adenopathy.  Neurological: He is alert and oriented to person, place, and time. No cranial nerve deficit.  No dysarthria, no aphasia  Skin: Skin is warm and dry. No rash noted. No erythema.  Psychiatric: He has a normal mood and affect. His behavior is normal. Judgment and thought content normal.    LABORATORY PANEL:   CBC Recent Labs  Lab 01/16/18 1347  WBC 5.4  HGB  13.4  HCT 39.8*  PLT 243   ------------------------------------------------------------------------------------------------------------------  Chemistries  Recent Labs  Lab 01/16/18 1347  NA 136  K 3.6  CL 106  CO2 23  GLUCOSE 435*  BUN 15  CREATININE 1.09  CALCIUM 8.4*   ------------------------------------------------------------------------------------------------------------------  Cardiac Enzymes Recent Labs  Lab 01/16/18 1738  TROPONINI <0.03   ------------------------------------------------------------------------------------------------------------------  RADIOLOGY:  Dg Chest 2 View  Result Date: 01/16/2018 CLINICAL DATA:  Mid chest pain radiating into the left chest and down the arm associated with shortness of breath and dizziness. History of coronary artery disease and CABG as well as stent placement. Former smoker. EXAM: CHEST - 2 VIEW COMPARISON:  Portable chest x-ray of July 18, 2017 FINDINGS: The lungs are adequately inflated. There is no focal infiltrate. There is no pleural effusion. The heart and pulmonary vascularity are normal. The sternal wires are intact. The mediastinum is normal in width. There is mild multilevel degenerative disc disease of the lower thoracic spine. IMPRESSION: There is no CHF, pneumonia, nor other acute cardiopulmonary abnormality. Electronically Signed   By: Kenidee Cregan  [place] M.D.   On: 01/16/2018 14:37    EKG:   Orders placed or performed during the hospital encounter of 01/16/18  . EKG 12-Lead  . EKG 12-Lead  . ED EKG within 10 minutes  . ED EKG within 10 minutes    IMPRESSION AND PLAN:  Principal Problem:   Chest pain -with negative workup thus far, we will trend his troponin for at least one more lab value, will get an echocardiogram in the morning and a cardiology consult Active Problems:   CAD, ARTERY BYPASS GRAFT -continue home meds, other workup as above   Essential hypertension -stable, continue home meds   Diabetes  mellitus type 2 with complications, uncontrolled (HCC) -sliding scale insulin with corresponding glucose checks   Chronic combined systolic and diastolic CHF (congestive heart failure) (HCC) -continue home medications, chest x-ray reassuring with no significant edema or other abnormality seen today   Mixed hyperlipidemia -home dose statin  Chart review performed and case discussed with ED provider.  Labs, imaging and/or ECG reviewed by provider and discussed with patient/family. Management plans discussed with the patient and/or family.  DVT PROPHYLAXIS: SubQ lovenox  GI PROPHYLAXIS: None  ADMISSION STATUS: Observation  CODE STATUS: Full Code Status History    Date Active Date Inactive Code Status Order ID Comments User Context   07/18/2017 1441 07/19/2017 2226 Full Code [id number]  Enid Baas, MD Inpatient   07/10/2017 1542 07/12/2017 1746 Full Code [id number]  Corky Crafts, MD Inpatient   09/28/2015 1719 09/29/2015 1603 Full Code [id number]  Auburn Bilberry, MD Inpatient   02/16/2012 0308 02/18/2012 1546 Full Code [id number]  Eduard Clos, MD Inpatient      TOTAL TIME TAKING CARE OF THIS PATIENT: 40 minutes.   Demri Poulton FIELDING 01/16/2018, 8:36 PM  [facility]  Office  [phone]  CC: Primary care physician; Marisue Ivan, MD  Note:  This document was prepared using Dragon voice recognition software and may include unintentional dictation errors.

## 2018-01-16 NOTE — ED Provider Notes (Signed)
[facility] Emergency Department Provider Note  ____________________________________________  Time seen: Approximately 6:17 PM  I have reviewed the triage vital signs and the nursing notes.   HISTORY  Chief Complaint Chest Pain   HPI [name] is a 37 y.o. male with history of coronary artery disease status post bypass and stents, on aspirin and Plavix, heart failure with EF of 35-40%, hypertension, hyperlipidemia, diabetes who presents for evaluation of chest pain.  Patient reports that he woke up with chest pain which is similar to his prior heart attacks.  He reports pressure in the center of his chest radiating to his left arm, initially severe associated with diaphoresis, lightheadedness, shortness of breath.  Patient did not take nitro since he is allergic to it.  He reports that the pain at this time is 4 out of 10.  He endorses compliance with his medications.  He denies any personal or family history of blood clots, leg pain or swelling, recent travel immobilization, hemoptysis, or exogenous hormones.  Last catheterization was in 06/2017 showing significant disease.   Past Medical History:  Diagnosis Date  . Coronary atherosclerosis of artery bypass graft    a. 10/2006 s/p PCI/stenting of LCX;  b. 02/2007 s/p CABG x 4 @ age 37 (LIMA->LAD, RIMA->Diag, VG->OM, VG->RPDA;  c. 06/2017 Lateral STEMI/PCI: LM 50, LAD 100ost, 3163m, RI small, LCX 95ost/p (PTCA - 2.455mm balloon), 70p/m ISR, OM2 75, RCA 100p/m, VG->RPDA 25, VG->OM2 80ost (3.0x15 Sierra DES), RIMA->Diag 100, LIMA->LAD nl.  . HFrEF (heart failure with reduced ejection fraction) (HCC)    a. 07/2017 Echo: EF 35-40%  . HLD (hyperlipidemia)    poorly controlled  . HTN (hypertension)   . Hx-TIA (transient ischemic attack) 2009   possible-([facility]) Normal MRI of brain and MRA of the head and neck  . Ischemic cardiomyopathy    a. 07/2017 Echo: EF 35-40%, apicalanteroseptal and apical AK, GR2 DD, mildly to mod  reduced RV fxn.  . Kidney stone   . Noncompliance   . Obesity, unspecified   . Uncontrolled diabetes mellitus with complications (HCC)    a. Dx 2002 - poorly controlled in setting of noncompliance;  b. 07/2017 HbA1c 12.6.    Patient Active Problem List   Diagnosis Date Noted  . NSTEMI (non-ST elevated myocardial infarction) (HCC) 07/18/2017  . Acute systolic heart failure (HCC) 07/12/2017  . Acute MI, lateral wall (HCC)   . Chest pain 09/28/2015  . Non compliance with medical treatment 03/24/2015  . Left flank pain 03/24/2015  . Cervical radiculitis 09/04/2014  . Acute prostatitis 07/20/2014  . Diabetes mellitus type 2 with complications, uncontrolled (HCC) 02/13/2014  . Macular edema 02/13/2014  . Mixed hyperlipidemia 11/08/2013  . Left shoulder pain 11/08/2013  . Financial difficulties 11/08/2013  . Depression with anxiety 11/08/2013  . Adjustment disorder with anxiety 10/26/2012  . Headache(784.0) 05/04/2011  . OBESITY 12/22/2010  . Atypical chest pain 03/05/2009  . Hyperlipidemia 03/03/2009  . Essential hypertension 03/03/2009  . CAD, ARTERY BYPASS GRAFT 03/03/2009    Past Surgical History:  Procedure Laterality Date  . CORONARY ARTERY BYPASS GRAFT  2007   x 4 (age 37)  . CORONARY BALLOON ANGIOPLASTY N/A 07/10/2017   Procedure: CORONARY BALLOON ANGIOPLASTY;  Surgeon: Corky CraftsVaranasi, Jayadeep S, MD;  [facility];  Service: Cardiovascular;  Laterality: N/A;  . CORONARY STENT INTERVENTION N/A 07/10/2017   Procedure: CORONARY STENT INTERVENTION;  Surgeon: Corky CraftsVaranasi, Jayadeep S, MD;  Location: [facility] INVASIVE CV LAB;  Service:  Cardiovascular;  Laterality: N/A;  . LEFT HEART CATH AND CORS/GRAFTS ANGIOGRAPHY N/A 07/10/2017   Procedure: LEFT HEART CATH AND CORS/GRAFTS ANGIOGRAPHY;  Surgeon: Corky Crafts, MD;  Location: [facility] INVASIVE CV LAB;  Service: Cardiovascular;  Laterality: N/A;    Prior to Admission medications   Medication Sig Start Date End Date Taking?  Authorizing Provider  aspirin EC 81 MG tablet Take 81 mg by mouth daily.    [provider]  atorvastatin (LIPITOR) 80 MG tablet Take 1 tablet (80 mg total) daily by mouth. 08/22/17   Gollan, Tollie Pizza, MD  clopidogrel (PLAVIX) 75 MG tablet Take 1 tablet (75 mg total) by mouth daily. 11/04/17   Creig Hines, NP  glucose blood test strip Use as instructed 07/19/17   Milagros Loll, MD  insulin aspart protamine- aspart (NOVOLOG MIX 70/30) (70-30) 100 UNIT/ML injection Inject 15 Units into the skin 3 (three) times daily.    [provider]  losartan (COZAAR) 25 MG tablet Take 1 tablet (25 mg total) at bedtime by mouth. 08/22/17   Gollan, Tollie Pizza, MD  metoprolol tartrate (LOPRESSOR) 25 MG tablet Take 1 tablet (25 mg total) 2 (two) times daily by mouth. 08/22/17   Gollan, Tollie Pizza, MD  Multiple Vitamin (MULTIVITAMIN) tablet Take 2 tablets by mouth daily.     [provider]    Allergies Nitroglycerin  Family History  Problem Relation Age of Onset  . Emphysema Father        + smoker  . COPD Father        Died age 42  . Coronary artery disease Maternal Grandfather   . Heart attack Maternal Grandfather   . Diabetes Maternal Grandfather   . Hypertension Maternal Grandfather   . Hyperlipidemia Maternal Grandfather   . Heart disease Maternal Grandfather        CAD  . Lung cancer Unknown        paternal uncles and aunts (all smokers)  . Coronary artery disease Unknown        premature-family history    Social History Social History   Tobacco Use  . Smoking status: Former Smoker    Packs/day: 2.00    Years: 14.00    Pack years: 28.00    Types: Cigarettes    Last attempt to quit: 10/11/2004    Years since quitting: 13.2  . Smokeless tobacco: Former Neurosurgeon    Types: Chew, Snuff  . Tobacco comment: used to smoke 1 1/2 ppd, quit 2006  Substance Use Topics  . Alcohol use: Yes    Alcohol/week: 1.2 oz    Types: 2 Cans of beer per week    Comment:  7-8 lite beers per month  . Drug use: No    Review of Systems  Constitutional: Negative for fever. + dizziness Eyes: Negative for visual changes. ENT: Negative for sore throat. Neck: No neck pain  Cardiovascular: + chest pain, diaphoresis Respiratory: + shortness of breath. Gastrointestinal: Negative for abdominal pain, vomiting or diarrhea. Genitourinary: Negative for dysuria. Musculoskeletal: Negative for back pain. Skin: Negative for rash. Neurological: Negative for headaches, weakness or numbness. Psych: No SI or HI  ____________________________________________   PHYSICAL EXAM:  VITAL SIGNS: ED Triage Vitals  Enc Vitals Group     BP 01/16/18 1343 (!) 142/73     Pulse Rate 01/16/18 1343 90     Resp 01/16/18 1343 20     Temp 01/16/18 1343 97.7 F (36.5 C)     Temp Source 01/16/18  1343 Oral     SpO2 01/16/18 1343 99 %     Weight 01/16/18 1344 230 lb (104.3 kg)     Height 01/16/18 1344 5\' 11"  (1.803 m)     Head Circumference --      Peak Flow --      Pain Score 01/16/18 1351 9     Pain Loc --      Pain Edu? --      Excl. in GC? --     Constitutional: Alert and oriented. Well appearing and in no apparent distress. HEENT:      Head: Normocephalic and atraumatic.         Eyes: Conjunctivae are normal. Sclera is non-icteric.       Mouth/Throat: Mucous membranes are moist.       Neck: Supple with no signs of meningismus. Cardiovascular: Regular rate and rhythm. No murmurs, gallops, or rubs. 2+ symmetrical distal pulses are present in all extremities. No JVD. Respiratory: Normal respiratory effort. Lungs are clear to auscultation bilaterally. No wheezes, crackles, or rhonchi.  Gastrointestinal: Soft, non tender, and non distended with positive bowel sounds. No rebound or guarding. Musculoskeletal: Nontender with normal range of motion in all extremities. No edema, cyanosis, or erythema of extremities. Neurologic: Normal speech and language. Face is symmetric. Moving  all extremities. No gross focal neurologic deficits are appreciated. Skin: Skin is warm, dry and intact. No rash noted. Psychiatric: Mood and affect are normal. Speech and behavior are normal.  ____________________________________________   LABS (all labs ordered are listed, but only abnormal results are displayed)  Labs Reviewed  BASIC METABOLIC PANEL - Abnormal; Notable for the following components:      Result Value   Glucose, Bld 435 (*)    Calcium 8.4 (*)    All other components within normal limits  CBC - Abnormal; Notable for the following components:   HCT 39.8 (*)    All other components within normal limits  TROPONIN I  TROPONIN I   ____________________________________________  EKG  ED ECG REPORT I, Nita Sickle, the attending physician, personally viewed and interpreted this ECG.  Normal sinus rhythm, rate of 92, normal intervals, normal axis, no ST elevations or depressions, T wave inversions in 1 and aVL.  These inversions are old when compared to prior. ____________________________________________  RADIOLOGY  I have personally reviewed the images performed during this visit and I agree with the Radiologist's read.   Interpretation by Radiologist:  Dg Chest 2 View  Result Date: 01/16/2018 CLINICAL DATA:  Mid chest pain radiating into the left chest and down the arm associated with shortness of breath and dizziness. History of coronary artery disease and CABG as well as stent placement. Former smoker. EXAM: CHEST - 2 VIEW COMPARISON:  Portable chest x-ray of July 18, 2017 FINDINGS: The lungs are adequately inflated. There is no focal infiltrate. There is no pleural effusion. The heart and pulmonary vascularity are normal. The sternal wires are intact. The mediastinum is normal in width. There is mild multilevel degenerative disc disease of the lower thoracic spine. IMPRESSION: There is no CHF, pneumonia, nor other acute cardiopulmonary abnormality.  Electronically Signed   By: David  [place] M.D.   On: 01/16/2018 14:37     ____________________________________________   PROCEDURES  Procedure(s) performed: None Procedures Critical Care performed:  None ____________________________________________   INITIAL IMPRESSION / ASSESSMENT AND PLAN / ED COURSE   37 y.o. male with history of coronary artery disease status post bypass and stents, on aspirin  and Plavix, heart failure with EF of 35-40%, hypertension, hyperlipidemia, diabetes who presents for evaluation of chest pain, diaphoresis, nausea, and SOB.  Patient reports that his symptoms are similar to his prior heart attacks.  Last catheterization was in 9/18 showing significant disease.  EKG is unchanged from baseline.  Troponin x2 is negative.  Due to the severity of patient's disease and the fact that he reports this pain being similar to his prior heart attacks I will admit him to the hospitalist service for further evaluation. Patient is pain free after ASA and morphine.      As part of my medical decision making, I reviewed the following data within the electronic MEDICAL RECORD NUMBER Nursing notes reviewed and incorporated, Labs reviewed , EKG interpreted , Old EKG reviewed, Old chart reviewed, Radiograph reviewed , Discussed with admitting physician , Notes from prior ED visits and [place] Controlled Substance Database    Pertinent labs & imaging results that were available during my care of the patient were reviewed by me and considered in my medical decision making (see chart for details).    ____________________________________________   FINAL CLINICAL IMPRESSION(S) / ED DIAGNOSES  Final diagnoses:  Chest pain, unspecified type      NEW MEDICATIONS STARTED DURING THIS VISIT:  ED Discharge Orders    None       Note:  This document was prepared using Dragon voice recognition software and may include unintentional dictation errors.    Don Perking, [place], MD 01/16/18  [phone]

## 2018-01-16 NOTE — ED Notes (Signed)
Patient transported to 241 

## 2018-01-16 NOTE — ED Triage Notes (Signed)
FIRST NURSE NOTE-here for CP and both arms numb. Pulled for EKG

## 2018-01-17 ENCOUNTER — Observation Stay (HOSPITAL_BASED_OUTPATIENT_CLINIC_OR_DEPARTMENT_OTHER): Payer: BLUE CROSS/BLUE SHIELD

## 2018-01-17 ENCOUNTER — Other Ambulatory Visit: Payer: Self-pay

## 2018-01-17 DIAGNOSIS — E1165 Type 2 diabetes mellitus with hyperglycemia: Secondary | ICD-10-CM

## 2018-01-17 DIAGNOSIS — I10 Essential (primary) hypertension: Secondary | ICD-10-CM

## 2018-01-17 DIAGNOSIS — I25708 Atherosclerosis of coronary artery bypass graft(s), unspecified, with other forms of angina pectoris: Secondary | ICD-10-CM

## 2018-01-17 DIAGNOSIS — I200 Unstable angina: Secondary | ICD-10-CM

## 2018-01-17 LAB — BASIC METABOLIC PANEL
Anion gap: 4 — ABNORMAL LOW (ref 5–15)
BUN: 13 mg/dL (ref 6–20)
CO2: 26 mmol/L (ref 22–32)
Calcium: 8.4 mg/dL — ABNORMAL LOW (ref 8.9–10.3)
Chloride: 106 mmol/L (ref 101–111)
Creatinine, Ser: 0.85 mg/dL (ref 0.61–1.24)
GFR calc Af Amer: >60 mL/min (ref >60)
Glucose, Bld: 272 mg/dL — ABNORMAL HIGH (ref 65–99)
POTASSIUM: 3.6 mmol/L (ref 3.5–5.1)
SODIUM: 136 mmol/L (ref 135–145)

## 2018-01-17 LAB — GLUCOSE, CAPILLARY
Glucose-Capillary: 241 mg/dL — ABNORMAL HIGH (ref 65–99)
Glucose-Capillary: 234 mg/dL — ABNORMAL HIGH (ref 65–99)

## 2018-01-17 LAB — NM MYOCAR MULTI W/SPECT W/WALL MOTION / EF
CHL CUP RESTING HR STRESS: 69 {beats}/min
CSEPHR: 54 %
CSEPPHR: 100 {beats}/min
LV dias vol: 193 mL (ref 62–150)
LV sys vol: 116 mL
TID: 1.12

## 2018-01-17 LAB — CBC
HEMATOCRIT: 39.1 % — AB (ref 40.0–52.0)
Hemoglobin: 13.2 g/dL (ref 13.0–18.0)
MCH: 29.2 pg (ref 26.0–34.0)
MCHC: 33.8 g/dL (ref 32.0–36.0)
MCV: 86.4 fL (ref 80.0–100.0)
PLATELETS: 208 10*3/uL (ref 150–440)
RBC: 4.52 MIL/uL (ref 4.40–5.90)
RDW: 12.8 % (ref 11.5–14.5)
WBC: 5.6 10*3/uL (ref 3.8–10.6)

## 2018-01-17 LAB — LIPID PANEL
Cholesterol: 229 mg/dL — ABNORMAL HIGH (ref 0–200)
HDL: 33 mg/dL — AB (ref >40)
LDL CALC: 162 mg/dL — AB (ref 0–99)
TRIGLYCERIDES: 171 mg/dL — AB (ref <150)
Total CHOL/HDL Ratio: 6.9 RATIO
VLDL: 34 mg/dL (ref 0–40)

## 2018-01-17 LAB — HEMOGLOBIN A1C
Hgb A1c MFr Bld: 11.6 % — ABNORMAL HIGH (ref 4.8–5.6)
Mean Plasma Glucose: 286.22 mg/dL

## 2018-01-17 LAB — TROPONIN I

## 2018-01-17 MED ORDER — INSULIN GLARGINE 100 UNIT/ML ~~LOC~~ SOLN
20.0000 [IU] | Freq: Every day | SUBCUTANEOUS | Status: DC
  Administered 2018-01-17: 20 [IU] via SUBCUTANEOUS
  Filled 2018-01-17 (×2): qty 0.2

## 2018-01-17 MED ORDER — REGADENOSON 0.4 MG/5ML IV SOLN
0.4000 mg | Freq: Once | INTRAVENOUS | Status: AC
  Administered 2018-01-17: 0.4 mg via INTRAVENOUS

## 2018-01-17 MED ORDER — TECHNETIUM TC 99M TETROFOSMIN IV KIT
13.7500 | PACK | Freq: Once | INTRAVENOUS | Status: AC | PRN
  Administered 2018-01-17: 13.75 via INTRAVENOUS

## 2018-01-17 MED ORDER — RANOLAZINE ER 500 MG PO TB12
500.0000 mg | ORAL_TABLET | Freq: Two times a day (BID) | ORAL | 2 refills | Status: AC
Start: 2018-01-17 — End: ?

## 2018-01-17 MED ORDER — TECHNETIUM TC 99M TETROFOSMIN IV KIT
29.2000 | PACK | Freq: Once | INTRAVENOUS | Status: AC | PRN
  Administered 2018-01-17: 29.2 via INTRAVENOUS

## 2018-01-17 MED ORDER — RANOLAZINE ER 500 MG PO TB12
500.0000 mg | ORAL_TABLET | Freq: Two times a day (BID) | ORAL | Status: DC
  Filled 2018-01-17: qty 1

## 2018-01-17 NOTE — Progress Notes (Addendum)
Inpatient Diabetes Program Recommendations  AACE/ADA: New Consensus Statement on Inpatient Glycemic Control (2015)  Target Ranges:  Prepandial:   less than 140 mg/dL      Peak postprandial:   less than 180 mg/dL (1-2 hours)      Critically ill patients:  140 - 180 mg/dL  Results for [name], [name] (MRN [id number]) as of 01/17/2018 09:14  Ref. Range 01/16/2018 22:31 01/17/2018 05:02  Glucose-Capillary Latest Ref Range: 65 - 99 mg/dL 409237 (H) 811241 (H)   Results for [name], [name] (MRN [id number]) as of 01/17/2018 09:14  Ref. Range 01/16/2018 13:47 01/17/2018 05:12  Glucose Latest Ref Range: 65 - 99 mg/dL 213435 (H) 086272 (H)  Hemoglobin A1C Latest Ref Range: 4.8 - 5.6 %  11.6 (H)   Review of Glycemic Control  Outpatient Diabetes medications: 70/30 35 units BID Current orders for Inpatient glycemic control: Novolog 0-15 units Q6H  Inpatient Diabetes Program Recommendations: Insulin - Basal: Please consider ordering Lantus 20 units daily (based on 102 kg x 0.2 units). HgbA1C: A1C 11.6% on 01/17/18 indicating an average glucose of 286 mg/dl over the past 2-3 months. Patient was seen by Diabetes Coordinators on 07/19/17 and 07/12/18 during prior admissions. In reviewing Diabetes Coordinator note on 07/19/17 patient's A1C was 12.6% on 07/11/17 and patient was self managing DM on his own with NOVOLIN 70/30 insulin from [facility] which he was purchasing over the counter.  Addendum 01/17/18@12 :10-Spoke with patient about diabetes and home regimen for diabetes control. Patient reports that he is currently being followed by PCP for diabetes management but he has an initial consult with DR. Gershon CraneO'Connell in June to establish care with him.   Patient reports that he is taking Novolin 70/30 insulin an outpatient for diabetes control and [facility]. Patient's insurance will start this month and he plans to get switched to better insulin regimen once his insurance is active. Patient states that he  is not checking glucose very often. Discussed A1C results (11.6% on 01/17/18) and explained that his current A1C indicates an average glucose of 286 mg/dl over the past 2-3 months. Discussed glucose and A1C goals. Discussed importance of checking CBGs and maintaining good CBG control to prevent long-term and short-term complications.  Stressed to the patient the importance of improving glycemic control to prevent further complications from uncontrolled diabetes. Discussed importance of monitoring glucose to improve DM control and to help doctor to make changes with insulin regimen. Encouraged patient to check his glucose at least 2 times per day and to keep a log book of glucose readings and insulin taken which he will need to take to doctor appointments. Stressed importance of glucose monitoring especially prior to seeing Dr. Gershon Crane'Connell so that he will be able to use the information to make insulin changes to help improve DM control.   Patient verbalized understanding of information discussed and he states that he has no further questions at this time related to diabetes.  Thanks, Orlando PennerMarie Stormee Duda, RN, MSN, CDE Diabetes Coordinator Inpatient Diabetes Program [phone] (Team Pager from 8am to 5pm)

## 2018-01-17 NOTE — Plan of Care (Signed)
  Problem: Education: Goal: Understanding of cardiac disease, CV risk reduction, and recovery process will improve Outcome: Adequate for Discharge   Problem: Pain Managment: Goal: General experience of comfort will improve Outcome: Adequate for Discharge

## 2018-01-17 NOTE — Progress Notes (Signed)
[facility]  Date: 01/17/18 Time: 3:25 PM  Patient reports feeling better this afternoon.  No further chest pain.  Myoview was abnormal with anterior and inferolateral scar but no ischemia.  LVEF 39%, similar to prior echos.  Given lack of ischemia, we have agreed to escalate medical therapy and defer repeat catheterization at this time.  I recommend adding ranolazine 500 mg BID and having the patient follow-up in our office in 1-2 weeks to reassess symptoms.  Yvonne Kendallhristopher Tarina Volk, MD [facility] Pager: [phone]

## 2018-01-17 NOTE — Care Management (Signed)
Patient to discharge today.  Patient is listed at self pay.   Patient states that he has insurance on his wifes plan through [employer].  RNCM called patient registration,  Per card from 2018 that is scanned in patient is no longer active.  RNCM called patient's pharmacy and [facility] states that insurance is not on file.   Patient to discharge on [place],  Cash prices $434.  Patient was in enrolled in Match program and provided patient with information.  It appears that patient previously used match in 10/18.  Wife is at the bedside.  She states that she enrolled her husband in prescription drug coverage that was to be active on 01/09/18.  She does not have the new card available.  I have asked her to call [employer] and get the new subscriber number [facility]  RNCM will follow up with [facility] tomorrow to see if script was picked up.

## 2018-01-17 NOTE — Progress Notes (Signed)
     [name] was admitted to the [facility] on 01/16/2018 for an acute medical condition and is being Discharged on  01/17/2018 . He will need another 2 days for recovery and so advised to stay away from work until then. So please excuse him  from work for the above Days. Should be able to return to work without any restrictions from 01/20/18.  Call [name] Baasadhika Mylinh Cragg  MD, [facility] at  [phone] with questions.  [name],[name] M.D on 01/17/2018,at 5:40 PM  [facility] [address], [place] [id number]

## 2018-01-17 NOTE — Plan of Care (Signed)
Denies chest pain.  No distress noted.

## 2018-01-17 NOTE — Consult Note (Signed)
Cardiology Consultation:   Patient ID: [name]; [id number]; 11-19-1980   Admit date: 01/16/2018 Date of Consult: 01/17/2018  Primary Care Provider: Marisue IvanLinthavong, [place], MD Primary Cardiologist: Mariah MillingGollan   Patient Profile:   [name] is a 37 y.o. male with a hx of premature CAD status post four-vessel CABG in 2007 status post PCI/DES to SVG-OM and PTCA of the LCx as detailed below, ICM/HFrEF, poorly controlled diabetes, history of medication noncompliance, hypertension, hyperlipidemia, and possible TIA who is being seen today for the evaluation of chest pain at the request of Dr. Anne HahnWillis.  History of Present Illness:   [name] is status post four-vessel CABG at the age of 37.  Admitted to the hospital in 06/2017 in the setting of chest pain and lateral ST elevation.  Cardiac catheterization at that time revealed severe proximal, native LCx disease (small vessel, underwent PTCA) and severe ostial SVG to OM disease that was successfully treated with PCI/DES.  Echo during that admission showed reduced LV systolic function with an EF of 35-40%.  He was noted to have continued poorly controlled diabetes.  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  Medical management was continued.  He did not take this medication.  He was most recently seen by his primary cardiologist in 08/2017 and continued to note difficulty and medication compliance.  He had done well since that office visit up until 4/8 when he was at work and developed sudden onset of chest pressure with associated left arm and perioral paresthesias.  Symptoms did not feel like his prior episodes of chest pain as he described these prior episodes as "flulike."  Symptoms lasted for several hours followed by spontaneous resolution.  Because of his symptoms his supervisor felt like he should be evaluated in the ED.  Upon the patient's arrival  [facility] they were found to have stable vital signs. EKG as below, CXR showed no acute process. Labs showed troponin negative x3, WBC 5.4, hemoglobin 13.4, platelet count 243, serum creatinine 1.09, glucose 435, potassium 3.6.  In the ED he was given ASA 324 mg x1, as well as insulin and morphine.  Upon admission, cardiology was asked to evaluate patient.  Currently chest pain-free.  Past Medical History:  Diagnosis Date  . Coronary atherosclerosis of artery bypass graft    a. 10/2006 s/p PCI/stenting of LCX;  b. 02/2007 s/p CABG x 4 @ age 37 (LIMA->LAD, RIMA->Diag, VG->OM, VG->RPDA;  c. 06/2017 Lateral STEMI/PCI: LM 50, LAD 100ost, 1016m, RI small, LCX 95ost/p (PTCA - 2.75mm balloon), 70p/m ISR, OM2 75, RCA 100p/m, VG->RPDA 25, VG->OM2 80ost (3.0x15 Sierra DES), RIMA->Diag 100, LIMA->LAD nl.  . HFrEF (heart failure with reduced ejection fraction) (HCC)    a. 07/2017 Echo: EF 35-40%  . HLD (hyperlipidemia)    poorly controlled  . HTN (hypertension)   . Hx-TIA (transient ischemic attack) 2009   possible-([facility]) Normal MRI of brain and MRA of the head and neck  . Ischemic cardiomyopathy    a. 07/2017 Echo: EF 35-40%, apicalanteroseptal and apical AK, GR2 DD, mildly to mod reduced RV fxn.  . Kidney stone   . Noncompliance   . Obesity, unspecified   . Uncontrolled diabetes mellitus with complications (HCC)    a. Dx 2002 - poorly controlled in setting of noncompliance;  b. 07/2017 HbA1c 12.6.    Past Surgical History:  Procedure Laterality Date  .  CORONARY ARTERY BYPASS GRAFT  2007   x 4 (age 66)  . CORONARY BALLOON ANGIOPLASTY N/A 07/10/2017   Procedure: CORONARY BALLOON ANGIOPLASTY;  Surgeon: Corky Crafts, MD;  Location: [facility] INVASIVE CV LAB;  Service: Cardiovascular;  Laterality: N/A;  . CORONARY STENT INTERVENTION N/A 07/10/2017   Procedure: CORONARY STENT INTERVENTION;  Surgeon: Corky Crafts, MD;  Location: [facility] INVASIVE CV LAB;  Service: Cardiovascular;  Laterality: N/A;  . LEFT  HEART CATH AND CORS/GRAFTS ANGIOGRAPHY N/A 07/10/2017   Procedure: LEFT HEART CATH AND CORS/GRAFTS ANGIOGRAPHY;  Surgeon: Corky Crafts, MD;  Location: [facility] INVASIVE CV LAB;  Service: Cardiovascular;  Laterality: N/A;     Home Meds: Prior to Admission medications   Medication Sig Start Date End Date Taking? Authorizing Provider  aspirin EC 81 MG tablet Take 81 mg by mouth daily.   Yes [provider]  atorvastatin (LIPITOR) 80 MG tablet Take 1 tablet (80 mg total) daily by mouth. 08/22/17  Yes Gollan, Tollie Pizza, MD  clopidogrel (PLAVIX) 75 MG tablet Take 1 tablet (75 mg total) by mouth daily. 11/04/17  Yes Creig Hines, NP  glucose blood test strip Use as instructed 07/19/17  Yes Sudini, Wardell Heath, MD  insulin aspart protamine- aspart (NOVOLOG MIX 70/30) (70-30) 100 UNIT/ML injection Inject 35 Units into the skin 2 (two) times daily.    Yes [provider]  metoprolol tartrate (LOPRESSOR) 25 MG tablet Take 1 tablet (25 mg total) 2 (two) times daily by mouth. 08/22/17  Yes Gollan, Tollie Pizza, MD  losartan (COZAAR) 25 MG tablet Take 1 tablet (25 mg total) at bedtime by mouth. Patient not taking: Reported on 01/16/2018 08/22/17   Antonieta Iba, MD    Inpatient Medications: Scheduled Meds: . aspirin EC  81 mg Oral Daily  . atorvastatin  80 mg Oral Daily  . clopidogrel  75 mg Oral Daily  . enoxaparin (LOVENOX) injection  40 mg Subcutaneous Q24H  . insulin aspart  0-15 Units Subcutaneous Q6H  . metoprolol tartrate  25 mg Oral BID   Continuous Infusions:  PRN Meds: acetaminophen **OR** acetaminophen, morphine injection, ondansetron **OR** ondansetron (ZOFRAN) IV, oxyCODONE  Allergies:   Allergies  Allergen Reactions  . Nitroglycerin Nausea And Vomiting    Social History:   Social History   Socioeconomic History  . Marital status: Married    Spouse name: Not on file  . Number of children: 1  . Years of education: Not on file  . Highest education  level: Not on file  Occupational History  . Occupation: disability for heart condition  Social Needs  . Financial resource strain: Not on file  . Food insecurity:    Worry: Not on file    Inability: Not on file  . Transportation needs:    Medical: Not on file    Non-medical: Not on file  Tobacco Use  . Smoking status: Former Smoker    Packs/day: 2.00    Years: 14.00    Pack years: 28.00    Types: Cigarettes    Last attempt to quit: 10/11/2004    Years since quitting: 13.2  . Smokeless tobacco: Former Neurosurgeon    Types: Chew, Snuff  . Tobacco comment: used to smoke 1 1/2 ppd, quit 2006  Substance and Sexual Activity  . Alcohol use: Yes    Alcohol/week: 1.2 oz    Types: 2 Cans of beer per week    Comment: 7-8 lite beers per month  . Drug use: No  .  Sexual activity: Yes    Partners: Female  Lifestyle  . Physical activity:    Days per week: Not on file    Minutes per session: Not on file  . Stress: Not on file  Relationships  . Social connections:    Talks on phone: Not on file    Gets together: Not on file    Attends religious service: Not on file    Active member of club or organization: Not on file    Attends meetings of clubs or organizations: Not on file    Relationship status: Not on file  . Intimate partner violence:    Fear of current or ex partner: Not on file    Emotionally abused: Not on file    Physically abused: Not on file    Forced sexual activity: Not on file  Other Topics Concern  . Not on file  Social History Narrative      Caffeine: 3 cups coffee ~ 4 times per week, 2L diet soda per day      Lives with wife and son (15 y/o)      Lives in [place]      On disability      Exercises - walk, push ups, sit-ups. Coaches baseball. Hunting.           Family History:   Family History  Problem Relation Age of Onset  . Emphysema Father        + smoker  . COPD Father        Died age 42  . Coronary artery disease Maternal Grandfather   . Heart  attack Maternal Grandfather   . Diabetes Maternal Grandfather   . Hypertension Maternal Grandfather   . Hyperlipidemia Maternal Grandfather   . Heart disease Maternal Grandfather        CAD  . Lung cancer Unknown        paternal uncles and aunts (all smokers)  . Coronary artery disease Unknown        premature-family history    ROS:  Review of Systems  Constitutional: Positive for malaise/fatigue. Negative for chills, diaphoresis, fever and weight loss.  HENT: Negative for congestion.   Eyes: Negative for discharge and redness.  Respiratory: Negative for cough, hemoptysis, sputum production, shortness of breath and wheezing.   Cardiovascular: Positive for chest pain. Negative for palpitations, orthopnea, claudication, leg swelling and PND.  Gastrointestinal: Negative for abdominal pain, blood in stool, heartburn, melena, nausea and vomiting.  Genitourinary: Negative for hematuria.  Musculoskeletal: Negative for falls and myalgias.  Skin: Negative for rash.  Neurological: Positive for weakness. Negative for dizziness, tingling, tremors, sensory change, speech change, focal weakness and loss of consciousness.  Endo/Heme/Allergies: Does not bruise/bleed easily.  Psychiatric/Behavioral: Negative for substance abuse. The patient is nervous/anxious.   All other systems reviewed and are negative.     Physical Exam/Data:   Vitals:   01/16/18 2130 01/16/18 2159 01/17/18 0501 01/17/18 0700  BP: (!) 143/95 (!) 143/93 103/62 123/83  Pulse: 81 87 71 68  Resp: 15 16 18 18   Temp:  97.6 F (36.4 C) 98 F (36.7 C) 98.2 F (36.8 C)  TempSrc:  Oral Oral Oral  SpO2:  96% 100% 98%  Weight:  225 lb 12.8 oz (102.4 kg)    Height:        Intake/Output Summary (Last 24 hours) at 01/17/2018 1150 Last data filed at 01/17/2018 0729 Gross per 24 hour  Intake -  Output 600 ml  Net -600  ml   Filed Weights   01/16/18 1344 01/16/18 2159  Weight: 230 lb (104.3 kg) 225 lb 12.8 oz (102.4 kg)   Body  mass index is 31.49 kg/m.   Physical Exam: General: Well developed, well nourished, in no acute distress. Head: Normocephalic, atraumatic, sclera non-icteric, no xanthomas, nares without discharge.  Neck: Negative for carotid bruits. JVD not elevated. Lungs: Clear bilaterally to auscultation without wheezes, rales, or rhonchi. Breathing is unlabored. Heart: RRR with S1 S2. I/I systolic murmur, no rubs, or gallops appreciated. Abdomen: Soft, non-tender, non-distended with normoactive bowel sounds. No hepatomegaly. No rebound/guarding. No obvious abdominal masses. Msk:  Strength and tone appear normal for age. Extremities: No clubbing or cyanosis. No edema. Distal pedal pulses are 2+ and equal bilaterally. Neuro: Alert and oriented X 3. No facial asymmetry. No focal deficit. Moves all extremities spontaneously. Psych:  Responds to questions appropriately with a normal affect.   EKG:  The EKG was personally reviewed and demonstrates: NSR, 92 bpm, possible prior septal and lateral infarct Telemetry:  Telemetry was personally reviewed and demonstrates: NSR  Weights: Filed Weights   01/16/18 1344 01/16/18 2159  Weight: 230 lb (104.3 kg) 225 lb 12.8 oz (102.4 kg)    Relevant CV Studies: LHC 06/2017: Conclusion     Ost LM to LM lesion, 50 %stenosed. Severe three vessel CAD.  Prox RCA to Mid RCA lesion, 100 %stenosed. SVG to PDA is patent.  Prox Cx to Mid Cx lesion, 70 %stenosed. This is calcified instent restenosis and is likely old.  2nd Mrg lesion, 75 %stenosed.  Ost LAD lesion, 100 %stenosed. LIMA to LAD is patent but LAD distal to anastamosis is heavily diseased.  RIMA to diagonal, 100 %stenosed. RIMA is a Y graft off of the SVG to OM.  LV end diastolic pressure is moderately elevated.  There is no aortic valve stenosis.  Ost Cx to Prox Cx lesion, 95 %stenosed. Post intervention with 2.5 balloon, there is a 10% residual stenosis.  Origin lesion of SVG to OM, 80  %stenosed. A STENT SIERRA 3.00 X 15 MM drug eluting stent was successfully placed.  Post intervention, there is a 0% residual stenosis.  Continue dual antiplatelet therapy along with aggressive secondary prevention. He will need a case manager consult to help with affording medicines. He has not been able to afford his insulin. Will need to be sure that he stays on his dual antiplatelet therapy. If Brilinta turns out to be too expensive, could switch to clopidogrel.    TTE 07/2017: Study Conclusions  - Left ventricle: The cavity size was normal. Wall thickness was at the upper limits of normal. Systolic function was moderately reduced. The estimated ejection fraction was in the range of 35% to 40%. Akinesis of the apicalanteroseptal and apical myocardium. Features are consistent with a pseudonormal left ventricular filling pattern, with concomitant abnormal relaxation and increased filling pressure (grade 2 diastolic dysfunction). Doppler parameters are consistent with high ventricular filling pressure. - Mitral valve: There was mild regurgitation. - Left atrium: The atrium was mildly dilated. - Right ventricle: Poorly visualized. The cavity size was normal. Systolic function was mildly to moderately reduced. - Right atrium: The atrium was mildly dilated. - Pericardium, extracardiac: There was no pericardial effusion.   Laboratory Data:  Chemistry Recent Labs  Lab 01/16/18 1347 01/17/18 0512  NA 136 136  K 3.6 3.6  CL 106 106  CO2 23 26  GLUCOSE 435* 272*  BUN 15 13  CREATININE 1.09 0.85  CALCIUM  8.4* 8.4*  GFRNONAA >60 >60  GFRAA >60 >60  ANIONGAP 7 4*    No results for input(s): PROT, ALBUMIN, AST, ALT, ALKPHOS, BILITOT in the last 168 hours. Hematology Recent Labs  Lab 01/16/18 1347 01/17/18 0512  WBC 5.4 5.6  RBC 4.66 4.52  HGB 13.4 13.2  HCT 39.8* 39.1*  MCV 85.5 86.4  MCH 28.7 29.2  MCHC 33.6 33.8  RDW 12.8 12.8  PLT 243 208    Cardiac Enzymes Recent Labs  Lab 01/16/18 1347 01/16/18 1738 01/17/18 0512  TROPONINI <0.03 <0.03 <0.03   No results for input(s): TROPIPOC in the last 168 hours.  BNPNo results for input(s): BNP, PROBNP in the last 168 hours.  DDimer No results for input(s): DDIMER in the last 168 hours.  Radiology/Studies:  Dg Chest 2 View  Result Date: 01/16/2018 IMPRESSION: There is no CHF, pneumonia, nor other acute cardiopulmonary abnormality. Electronically Signed   By: David  [place] M.D.   On: 01/16/2018 14:37    Assessment and Plan:   1.  Chest pain with moderate risk for cardiac etiology: -Currently chest pain-free -Discussed with patient in detail the evaluation options including stress testing and cardiac catheterization -Patient preferred to undergo stress testing at this time -Schedule Lexiscan Myoview this morning to evaluate for high risk ischemia, if abnormal he will require cardiac catheterization on 4/10 -Interestingly, patient has been able to work at his very strenuous job at a [employer] without symptoms up until 4/8  2.  CAD as above: -As above -Continue medical therapy with ASA, Plavix, Lipitor, metoprolol  3.  Hypertension: -Well-controlled, continue current medications  4.  Hyperlipidemia: -Lipitor as above -Check lipid panel, most recent LDL of 159 from 07/2017 -Goal LDL less than 70 -If LDL not at goal consider adding Zetia -May benefit from PCSK-9 inhibitor  5.  Poorly controlled diabetes: -Per IM   For questions or updates, please contact [facility] Please consult [email or web address] for contact info under Cardiology/STEMI.   Signed, Eula Listen, PA-C [facility] Pager: [phone] 01/17/2018, 11:50 AM

## 2018-01-18 NOTE — Discharge Summary (Signed)
[facility] [place] [facility]   PATIENT NAME: [name]    MR#:  [id number]  DATE OF BIRTH:  1980-12-17  DATE OF ADMISSION:  01/16/2018   ADMITTING PHYSICIAN: Oralia Manis, MD  DATE OF DISCHARGE: 01/17/2018  6:04 PM  PRIMARY CARE PHYSICIAN: Marisue Ivan, MD   ADMISSION DIAGNOSIS:   Chest pain, unspecified type [R07.9]  DISCHARGE DIAGNOSIS:   Principal Problem:   Chest pain Active Problems:   Essential hypertension   CAD, ARTERY BYPASS GRAFT   Mixed hyperlipidemia   Diabetes mellitus type 2 with complications, uncontrolled (HCC)   Chronic combined systolic and diastolic CHF (congestive heart failure) (HCC)   SECONDARY DIAGNOSIS:   Past Medical History:  Diagnosis Date  . Coronary atherosclerosis of artery bypass graft    a. 10/2006 s/p PCI/stenting of LCX;  b. 02/2007 s/p CABG x 4 @ age 66 (LIMA->LAD, RIMA->Diag, VG->OM, VG->RPDA;  c. 06/2017 Lateral STEMI/PCI: LM 50, LAD 100ost, 63m, RI small, LCX 95ost/p (PTCA - 2.79mm balloon), 70p/m ISR, OM2 75, RCA 100p/m, VG->RPDA 25, VG->OM2 80ost (3.0x15 Sierra DES), RIMA->Diag 100, LIMA->LAD nl.  . HFrEF (heart failure with reduced ejection fraction) (HCC)    a. 07/2017 Echo: EF 35-40%  . HLD (hyperlipidemia)    poorly controlled  . HTN (hypertension)   . Hx-TIA (transient ischemic attack) 2009   possible-([facility]) Normal MRI of brain and MRA of the head and neck  . Ischemic cardiomyopathy    a. 07/2017 Echo: EF 35-40%, apicalanteroseptal and apical AK, GR2 DD, mildly to mod reduced RV fxn.  . Kidney stone   . Noncompliance   . Obesity, unspecified   . Uncontrolled diabetes mellitus with complications (HCC)    a. Dx 2002 - poorly controlled in setting of noncompliance;  b. 07/2017 HbA1c 12.6.    HOSPITAL COURSE:   37 year old man with past medical history significant for CAD status post CABG in 2007, PCI in September 2018, chronic systolic heart failure from ischemic cardiomyopathy with known EF of  35%, uncontrolled diabetes mellitus, hypertension, history of TIA and medication noncompliance presents to hospital secondary to chest pain.  1.  Chest pain-likely stable angina. -Ruled out for MI, troponins x3 are negative.  Most recent echo as outpatient showing EF of 35-40%. -Appreciate cardiology consult as inpatient.  Stress test was abnormal due to anterior and inferolateral scar but no active ischemia. -Cardiology recommended outpatient follow-up, Ranexa 500 mg twice daily has been added as a new medication -Continue dual antiplatelet treatment with aspirin and Plavix. -Continue Lipitor and metoprolol  2.  Uncontrolled diabetes mellitus-advised compliance. -NovoLog 70/30 twice daily.  3.  Hypertension-on metoprolol and losartan  4.  Hyperlipidemia-on statin  Patient was chest pain-free prior to discharge.  Cardiology recommended outpatient follow-up.  DISCHARGE CONDITIONS:   Guarded  CONSULTS OBTAINED:   Treatment Team:  Iran Ouch, MD End, Cristal Deer, MD  DRUG ALLERGIES:   Allergies  Allergen Reactions  . Nitroglycerin Nausea And Vomiting   DISCHARGE MEDICATIONS:   Allergies as of 01/17/2018      Reactions   Nitroglycerin Nausea And Vomiting      Medication List    TAKE these medications   aspirin EC 81 MG tablet Take 81 mg by mouth daily.   atorvastatin 80 MG tablet Commonly known as:  LIPITOR Take 1 tablet (80 mg total) daily by mouth. Notes to patient:  For cholesterol   clopidogrel 75 MG tablet Commonly known as:  PLAVIX Take 1 tablet (75 mg  total) by mouth daily.   glucose blood test strip Use as instructed   losartan 25 MG tablet Commonly known as:  COZAAR Take 1 tablet (25 mg total) at bedtime by mouth.   metoprolol tartrate 25 MG tablet Commonly known as:  LOPRESSOR Take 1 tablet (25 mg total) 2 (two) times daily by mouth.   NOVOLOG MIX 70/30 (70-30) 100 UNIT/ML injection Generic drug:  insulin aspart protamine- aspart Inject  35 Units into the skin 2 (two) times daily.   ranolazine 500 MG 12 hr tablet Commonly known as:  RANEXA Take 1 tablet (500 mg total) by mouth 2 (two) times daily.        DISCHARGE INSTRUCTIONS:   1. PCP f/u in 1-2 weeks 2. Cardiology f/u in 2 weeks  DIET:   Cardiac diet and Diabetic diet  ACTIVITY:   Activity as tolerated  OXYGEN:   Home Oxygen: No.  Oxygen Delivery: room air  DISCHARGE LOCATION:   home   If you experience worsening of your admission symptoms, develop shortness of breath, life threatening emergency, suicidal or homicidal thoughts you must seek medical attention immediately by calling 911 or calling your MD immediately  if symptoms less severe.  You Must read complete instructions/literature along with all the possible adverse reactions/side effects for all the Medicines you take and that have been prescribed to you. Take any new Medicines after you have completely understood and accpet all the possible adverse reactions/side effects.   Please note  You were cared for by a hospitalist during your hospital stay. If you have any questions about your discharge medications or the care you received while you were in the hospital after you are discharged, you can call the unit and asked to speak with the hospitalist on call if the hospitalist that took care of you is not available. Once you are discharged, your primary care physician will handle any further medical issues. Please note that NO REFILLS for any discharge medications will be authorized once you are discharged, as it is imperative that you return to your primary care physician (or establish a relationship with a primary care physician if you do not have one) for your aftercare needs so that they can reassess your need for medications and monitor your lab values.    On the day of Discharge:  VITAL SIGNS:   Blood pressure 119/74, pulse 70, temperature 97.7 F (36.5 C), temperature source Oral, resp.  rate 16, height 5\' 11"  (1.803 m), weight 102.4 kg (225 lb 12.8 oz), SpO2 100 %.  PHYSICAL EXAMINATION:    GENERAL:  37 y.o.-year-old patient lying in the bed with no acute distress.  EYES: Pupils equal, round, reactive to light and accommodation. No scleral icterus. Extraocular muscles intact.  HEENT: Head atraumatic, normocephalic. Oropharynx and nasopharynx clear.  NECK:  Supple, no jugular venous distention. No thyroid enlargement, no tenderness.  LUNGS: Normal breath sounds bilaterally, no wheezing, rales,rhonchi or crepitation. No use of accessory muscles of respiration.  CARDIOVASCULAR: S1, S2 normal. No murmurs, rubs, or gallops.  ABDOMEN: Soft, non-tender, non-distended. Bowel sounds present. No organomegaly or mass.  EXTREMITIES: No pedal edema, cyanosis, or clubbing.  NEUROLOGIC: Cranial nerves II through XII are intact. Muscle strength 5/5 in all extremities. Sensation intact. Gait not checked.  PSYCHIATRIC: The patient is alert and oriented x 3.  SKIN: No obvious rash, lesion, or ulcer.   DATA REVIEW:   CBC Recent Labs  Lab 01/17/18 0512  WBC 5.6  HGB 13.2  HCT  39.1*  PLT 208    Chemistries  Recent Labs  Lab 01/17/18 0512  NA 136  K 3.6  CL 106  CO2 26  GLUCOSE 272*  BUN 13  CREATININE 0.85  CALCIUM 8.4*     Microbiology Results  Results for orders placed or performed during the hospital encounter of 07/10/17  MRSA PCR Screening     Status: None   Collection Time: 07/10/17  4:09 PM  Result Value Ref Range Status   MRSA by PCR NEGATIVE NEGATIVE Final    Comment:        The GeneXpert MRSA Assay (FDA approved for NASAL specimens only), is one component of a comprehensive MRSA colonization surveillance program. It is not intended to diagnose MRSA infection nor to guide or monitor treatment for MRSA infections.     RADIOLOGY:  No results found.   Management plans discussed with the patient, family and they are in agreement.  CODE STATUS:    Code Status History    Date Active Date Inactive Code Status Order ID Comments User Context   01/16/2018 2250 01/17/2018 2109 Full Code [id number]  Oralia Manis, MD Inpatient   07/18/2017 1441 07/19/2017 2226 Full Code [id number]  Enid Baas, MD Inpatient   07/10/2017 1542 07/12/2017 1746 Full Code [id number]  Corky Crafts, MD Inpatient   09/28/2015 1719 09/29/2015 1603 Full Code [id number]  Auburn Bilberry, MD Inpatient   02/16/2012 0308 02/18/2012 1546 Full Code [id number]  Eduard Clos, MD Inpatient      TOTAL TIME TAKING CARE OF THIS PATIENT: 38 minutes.    Enid Baas M.D on 01/18/2018 at 3:08 PM  Between 7am to 6pm - Pager - [phone]  After 6pm go to [email or web address] - Social research officer, government  [facility] [place] [facility]  Office  [phone]  CC: Primary care physician; Marisue Ivan, MD   Note: This dictation was prepared with Dragon dictation along with smaller phrase technology. Any transcriptional errors that result from this process are unintentional.

## 2018-01-30 ENCOUNTER — Telehealth: Payer: Self-pay | Admitting: Cardiovascular Disease

## 2018-01-30 NOTE — Telephone Encounter (Signed)
Patient wife dropped off FMLA forms to be completed Medical Release form has been signed  Placed in [employer]office mail to [employer]

## 2018-02-14 NOTE — Telephone Encounter (Signed)
Given to Dr Windell Hummingbird nurse.

## 2018-02-14 NOTE — Telephone Encounter (Signed)
Received paperwork from [employer] nurses bin

## 2018-02-15 NOTE — Telephone Encounter (Signed)
Forms completed and given to [facility] for processing.

## 2018-02-16 NOTE — Telephone Encounter (Signed)
Sent completed forms to [employer] 

## 2018-03-08 ENCOUNTER — Encounter (INDEPENDENT_AMBULATORY_CARE_PROVIDER_SITE_OTHER): Payer: BLUE CROSS/BLUE SHIELD | Admitting: Ophthalmology

## 2018-03-08 ENCOUNTER — Encounter (INDEPENDENT_AMBULATORY_CARE_PROVIDER_SITE_OTHER): Payer: Self-pay

## 2018-03-15 ENCOUNTER — Encounter (INDEPENDENT_AMBULATORY_CARE_PROVIDER_SITE_OTHER): Payer: BLUE CROSS/BLUE SHIELD | Admitting: Ophthalmology

## 2018-03-16 ENCOUNTER — Telehealth: Payer: Self-pay | Admitting: Cardiovascular Disease

## 2018-03-16 NOTE — Telephone Encounter (Signed)
Received records request from [employer]ability [employer] , forwarded to [employer] for processing. ° °

## 2018-04-12 ENCOUNTER — Emergency Department
Admission: EM | Admit: 2018-04-12 | Discharge: 2018-04-12 | Disposition: A | Discharge status: Home / Self Care | Payer: BLUE CROSS/BLUE SHIELD | Attending: Emergency Medicine | Admitting: Emergency Medicine

## 2018-04-12 ENCOUNTER — Emergency Department: Payer: BLUE CROSS/BLUE SHIELD

## 2018-04-12 DIAGNOSIS — E118 Type 2 diabetes mellitus with unspecified complications: Secondary | ICD-10-CM | POA: Diagnosis not present

## 2018-04-12 DIAGNOSIS — I110 Hypertensive heart disease with heart failure: Secondary | ICD-10-CM | POA: Diagnosis not present

## 2018-04-12 DIAGNOSIS — Z7982 Long term (current) use of aspirin: Secondary | ICD-10-CM | POA: Diagnosis not present

## 2018-04-12 DIAGNOSIS — Z87891 Personal history of nicotine dependence: Secondary | ICD-10-CM | POA: Diagnosis not present

## 2018-04-12 DIAGNOSIS — I5042 Chronic combined systolic (congestive) and diastolic (congestive) heart failure: Secondary | ICD-10-CM | POA: Insufficient documentation

## 2018-04-12 DIAGNOSIS — R55 Syncope and collapse: Secondary | ICD-10-CM | POA: Insufficient documentation

## 2018-04-12 DIAGNOSIS — Z7902 Long term (current) use of antithrombotics/antiplatelets: Secondary | ICD-10-CM | POA: Insufficient documentation

## 2018-04-12 DIAGNOSIS — Z79899 Other long term (current) drug therapy: Secondary | ICD-10-CM | POA: Diagnosis not present

## 2018-04-12 DIAGNOSIS — Z794 Long term (current) use of insulin: Secondary | ICD-10-CM | POA: Diagnosis not present

## 2018-04-12 LAB — CBC
HCT: 39.8 % — ABNORMAL LOW (ref 40.0–52.0)
HEMOGLOBIN: 13.7 g/dL (ref 13.0–18.0)
MCH: 29.6 pg (ref 26.0–34.0)
MCHC: 34.4 g/dL (ref 32.0–36.0)
MCV: 86.1 fL (ref 80.0–100.0)
Platelets: 241 10*3/uL (ref 150–440)
RBC: 4.63 MIL/uL (ref 4.40–5.90)
RDW: 12.6 % (ref 11.5–14.5)
WBC: 7.4 10*3/uL (ref 3.8–10.6)

## 2018-04-12 LAB — BASIC METABOLIC PANEL
ANION GAP: 12 (ref 5–15)
BUN: 18 mg/dL (ref 6–20)
CALCIUM: 9.3 mg/dL (ref 8.9–10.3)
CO2: 19 mmol/L — AB (ref 22–32)
Chloride: 102 mmol/L (ref 98–111)
Creatinine, Ser: 1.36 mg/dL — ABNORMAL HIGH (ref 0.61–1.24)
GFR calc Af Amer: >60 mL/min (ref >60)
GFR calc non Af Amer: >60 mL/min (ref >60)
GLUCOSE: 341 mg/dL — AB (ref 70–99)
Potassium: 4.1 mmol/L (ref 3.5–5.1)
Sodium: 133 mmol/L — ABNORMAL LOW (ref 135–145)

## 2018-04-12 LAB — CK: Total CK: 96 U/L (ref 49–397)

## 2018-04-12 LAB — TROPONIN I

## 2018-04-12 LAB — FIBRIN DERIVATIVES D-DIMER (ARMC ONLY): Fibrin derivatives D-dimer (ARMC): 333.63 ng/mL (FEU) (ref 0.00–499.00)

## 2018-04-12 MED ORDER — NITROGLYCERIN 2 % TD OINT
TOPICAL_OINTMENT | TRANSDERMAL | Status: AC
  Administered 2018-04-12: 16:00:00
  Filled 2018-04-12: qty 1

## 2018-04-12 MED ORDER — ASPIRIN 81 MG PO CHEW
CHEWABLE_TABLET | ORAL | Status: AC
  Administered 2018-04-12: 16:00:00
  Filled 2018-04-12: qty 4

## 2018-04-12 MED ORDER — ONDANSETRON HCL 4 MG/2ML IJ SOLN
INTRAMUSCULAR | Status: AC
  Administered 2018-04-12: 16:00:00
  Filled 2018-04-12: qty 2

## 2018-04-12 NOTE — ED Notes (Signed)
..  This RN Brock Raassandra  Ezabella Teska received verbal orders to administer: asprin 324 mg I nch of nitro and zofran Administered 1600 RN will continue to monitor.;

## 2018-04-12 NOTE — ED Provider Notes (Signed)
[facility] Emergency Department Provider Note   ____________________________________________   First MD Initiated Contact with Patient 04/12/18 1559     (approximate)  I have reviewed the triage vital signs and the nursing notes.   HISTORY  Chief Complaint Loss of Consciousness  HPI [name] is a 37 y.o. male Patient has a history of MI CVA and CABG was mowing his lawn today. He passed out. EMS was called when EMS got there they found him pale and diaphoretic. Patient reports she had been taking breaks drinking fluids. On the way here just before arriving patient developed chest pain and tightness which is now becoming sharp. Going up to 9 out of 10. Seems to be somewhat worse with deep breathing.patient taking aspirin and [place] at home. Patient's family reports that her glycerin makes him vomit. He does not swell up as was reported initially.   Past Medical History:  Diagnosis Date  . Coronary atherosclerosis of artery bypass graft    a. 10/2006 s/p PCI/stenting of LCX;  b. 02/2007 s/p CABG x 4 @ age 37 (LIMA->LAD, RIMA->Diag, VG->OM, VG->RPDA;  c. 06/2017 Lateral STEMI/PCI: LM 50, LAD 100ost, 8039m, RI small, LCX 95ost/p (PTCA - 2.85mm balloon), 70p/m ISR, OM2 75, RCA 100p/m, VG->RPDA 25, VG->OM2 80ost (3.0x15 Sierra DES), RIMA->Diag 100, LIMA->LAD nl.  . HFrEF (heart failure with reduced ejection fraction) (HCC)    a. 07/2017 Echo: EF 35-40%  . HLD (hyperlipidemia)    poorly controlled  . HTN (hypertension)   . Hx-TIA (transient ischemic attack) 2009   possible-([facility]) Normal MRI of brain and MRA of the head and neck  . Ischemic cardiomyopathy    a. 07/2017 Echo: EF 35-40%, apicalanteroseptal and apical AK, GR2 DD, mildly to mod reduced RV fxn.  . Kidney stone   . Noncompliance   . Obesity, unspecified   . Uncontrolled diabetes mellitus with complications (HCC)    a. Dx 2002 - poorly controlled in setting of noncompliance;  b. 07/2017 HbA1c 12.6.      Patient Active Problem List   Diagnosis Date Noted  . NSTEMI (non-ST elevated myocardial infarction) (HCC) 07/18/2017  . Chronic combined systolic and diastolic CHF (congestive heart failure) (HCC) 07/12/2017  . Acute MI, lateral wall (HCC)   . Chest pain 09/28/2015  . Non compliance with medical treatment 03/24/2015  . Left flank pain 03/24/2015  . Cervical radiculitis 09/04/2014  . Acute prostatitis 07/20/2014  . Diabetes mellitus type 2 with complications, uncontrolled (HCC) 02/13/2014  . Macular edema 02/13/2014  . Mixed hyperlipidemia 11/08/2013  . Left shoulder pain 11/08/2013  . Financial difficulties 11/08/2013  . Depression with anxiety 11/08/2013  . Adjustment disorder with anxiety 10/26/2012  . Headache(784.0) 05/04/2011  . OBESITY 12/22/2010  . Atypical chest pain 03/05/2009  . Hyperlipidemia 03/03/2009  . Essential hypertension 03/03/2009  . CAD, ARTERY BYPASS GRAFT 03/03/2009    Past Surgical History:  Procedure Laterality Date  . CORONARY ARTERY BYPASS GRAFT  2007   x 4 (age 37)  . CORONARY BALLOON ANGIOPLASTY N/A 07/10/2017   Procedure: CORONARY BALLOON ANGIOPLASTY;  Surgeon: Corky CraftsVaranasi, Jayadeep S, MD;  [facility];  Service: Cardiovascular;  Laterality: N/A;  . CORONARY STENT INTERVENTION N/A 07/10/2017   Procedure: CORONARY STENT INTERVENTION;  Surgeon: Corky CraftsVaranasi, Jayadeep S, MD;  Location: [facility] INVASIVE CV LAB;  Service: Cardiovascular;  Laterality: N/A;  . LEFT HEART CATH AND CORS/GRAFTS ANGIOGRAPHY N/A 07/10/2017   Procedure: LEFT HEART CATH AND CORS/GRAFTS ANGIOGRAPHY;  Surgeon:  Corky Crafts, MD;  Location: [facility] INVASIVE CV LAB;  Service: Cardiovascular;  Laterality: N/A;    Prior to Admission medications   Medication Sig Start Date End Date Taking? Authorizing Provider  aspirin EC 81 MG tablet Take 81 mg by mouth daily.   Yes [provider]  atorvastatin (LIPITOR) 80 MG tablet Take 1 tablet (80 mg total) daily by mouth.  08/22/17  Yes Gollan, Tollie Pizza, MD  BRILINTA 90 MG TABS tablet Take 90 mg by mouth 2 (two) times daily. 02/10/18  Yes [provider]  insulin aspart protamine- aspart (NOVOLOG MIX 70/30) (70-30) 100 UNIT/ML injection Inject 35 Units into the skin 2 (two) times daily.    Yes [provider]  losartan (COZAAR) 25 MG tablet Take 1 tablet (25 mg total) at bedtime by mouth. 08/22/17  Yes Gollan, Tollie Pizza, MD  metFORMIN (GLUCOPHAGE) 1000 MG tablet Take 1,000 mg by mouth 2 (two) times daily. 02/10/18  Yes [provider]  metoprolol tartrate (LOPRESSOR) 25 MG tablet Take 1 tablet (25 mg total) 2 (two) times daily by mouth. 08/22/17  Yes Gollan, Tollie Pizza, MD  PARoxetine (PAXIL) 20 MG tablet Take 20 mg by mouth at bedtime. 02/10/18  Yes [provider]  clopidogrel (PLAVIX) 75 MG tablet Take 1 tablet (75 mg total) by mouth daily. Patient not taking: Reported on 04/12/2018 11/04/17   Creig Hines, NP  glucose blood test strip Use as instructed 07/19/17   Milagros Loll, MD  ranolazine (RANEXA) 500 MG 12 hr tablet Take 1 tablet (500 mg total) by mouth 2 (two) times daily. Patient not taking: Reported on 04/12/2018 01/17/18   Enid Baas, MD    Allergies Nitroglycerin  Family History  Problem Relation Age of Onset  . Emphysema Father        + smoker  . COPD Father        Died age 85  . Coronary artery disease Maternal Grandfather   . Heart attack Maternal Grandfather   . Diabetes Maternal Grandfather   . Hypertension Maternal Grandfather   . Hyperlipidemia Maternal Grandfather   . Heart disease Maternal Grandfather        CAD  . Lung cancer Unknown        paternal uncles and aunts (all smokers)  . Coronary artery disease Unknown        premature-family history    Social History Social History   Tobacco Use  . Smoking status: Former Smoker    Packs/day: 2.00    Years: 14.00    Pack years: 28.00    Types: Cigarettes    Last attempt to quit:  10/11/2004    Years since quitting: 13.5  . Smokeless tobacco: Former Neurosurgeon    Types: Chew, Snuff  . Tobacco comment: used to smoke 1 1/2 ppd, quit 2006  Substance Use Topics  . Alcohol use: Yes    Alcohol/week: 1.2 oz    Types: 2 Cans of beer per week    Comment: 7-8 lite beers per month  . Drug use: No    Review of Systems  Constitutional: No fever/chills Eyes: No visual changes. ENT: No sore throat. Cardiovascular:hest pain. Respiratory: Denies shortness of breath. Gastrointestinal: No abdominal pain.  No nausea, no vomiting.  No diarrhea.  No constipation. Genitourinary: Negative for dysuria. Musculoskeletal: Negative for back pain. Skin: Negative for rash. Neurological: Negative for headaches, focal weakness ____________________________________________   PHYSICAL EXAM:  VITAL SIGNS: ED Triage Vitals  Enc Vitals Group  BP 04/12/18 1545 113/80     Pulse Rate 04/12/18 1545 84     Resp 04/12/18 1545 20     Temp 04/12/18 1545 97.7 F (36.5 C)     Temp Source 04/12/18 1545 Oral     SpO2 04/12/18 1545 98 %     Weight 04/12/18 1542 230 lb (104.3 kg)     Height 04/12/18 1542 5\' 7"  (1.702 m)     Head Circumference --      Peak Flow --      Pain Score 04/12/18 1542 10     Pain Loc --      Pain Edu? --      Excl. in GC? --     Constitutional: Alert and oriented. Ill appearing  in no acute distress. Eyes: Conjunctivae are normal.  Head: Atraumatic. Nose: No congestion/rhinnorhea. Mouth/Throat: Mucous membranes are moist.  Oropharynx non-erythematous. Neck: No stridor.   Cardiovascular: Normal rate, regular rhythm. Grossly normal heart sounds.  Good peripheral circulation. Respiratory: Normal respiratory effort.  No retractions. Lungs CTAB. Gastrointestinal: Soft and nontender. No distention. No abdominal bruits. No CVA tenderness. }Musculoskeletal: No lower extremity tenderness nor edema.  No joint effusions. Neurologic:  Normal speech and language. No gross focal  neurologic deficits are appreciated.  Skin:  Skin is warm, patient is sweaty and still somewhat pale Psychiatric: Mood and affect are normal. Speech and behavior are normal.  ____________________________________________   LABS (all labs ordered are listed, but only abnormal results are displayed)  Labs Reviewed  BASIC METABOLIC PANEL - Abnormal; Notable for the following components:      Result Value   Sodium 133 (*)    CO2 19 (*)    Glucose, Bld 341 (*)    Creatinine, Ser 1.36 (*)    All other components within normal limits  CBC - Abnormal; Notable for the following components:   HCT 39.8 (*)    All other components within normal limits  TROPONIN I  FIBRIN DERIVATIVES D-DIMER ([facility] ONLY)  TROPONIN I  CK   ____________________________________________  EKG  EKG read and interpreted by me shows normal sinus rhythm rate of 82 normal axis he has slight ST elevation in leads V5 and V6 which is the same as it was in April a this year. ____________________________________________  RADIOLOGY  ED MD interpretation:  patient had a stress testin April which showed some scars but no reversible defect  Official radiology report(s): Dg Chest 2 View  Result Date: 04/12/2018 CLINICAL DATA:  Chest pain.  Syncopal episode. EXAM: CHEST - 2 VIEW COMPARISON:  January 16, 2018 FINDINGS: There is no edema or consolidation. Heart size and pulmonary vascularity are normal. Patient is status post coronary artery bypass grafting. No adenopathy. There is degenerative change in the lower thoracic spine. No pneumothorax. IMPRESSION: No edema or consolidation.  Stable cardiac silhouette. Electronically Signed   By: Bretta Bang III M.D.   On: 04/12/2018 16:35    ____________________________________________   PROCEDURES  Procedure(s) performed:  Procedures  Critical Care performed:   ____________________________________________   INITIAL IMPRESSION / ASSESSMENT AND PLAN / ED COURSE  second  troponin is negative and the troponin was actually drawn after 1900 not at 1550 like it says in the computer at this point. Second EKG showed some PR segment depression in 2 and 3 but nothing else patient's pain is gone I discussed the patient in detail with Dr. Lewie Loron who feels that if the second troponin was negative the patient should be  on to go home as he had a negative stress test in April that is a stress test that showed no reversible deficits.         ____________________________________________   FINAL CLINICAL IMPRESSION(S) / ED DIAGNOSES  Final diagnoses:  Syncope and collapse  likely due to heat illness   ED Discharge Orders    None       Note:  This document was prepared using Dragon voice recognition software and may include unintentional dictation errors.    Arnaldo Natal, MD 04/12/18 2138

## 2018-04-12 NOTE — ED Notes (Signed)
.   Pt is resting, Respirations even and unlabored, NAD. Stretcher lowest postion and locked. Call bell within reach. Denies any needs at this time RN will continue to monitor.    

## 2018-04-12 NOTE — ED Notes (Signed)
Lab notified of CK blood order per Dr Darnelle CatalanMalinda. Lab verified that they have enough blood and will begin testing at this time.

## 2018-04-12 NOTE — Discharge Instructions (Addendum)
please continue all your medicines. Please give Dr. Lewie LoronGolan a call and follow-up with him in the office at the earliest possible appointment. Please return for any further chest pain. Please take it easy for the next 3 or 4 days do not go out in the heat for more than brief period stay in the air conditioning drink plenty of fluids. also follow-up with your regular doctor.

## 2018-04-12 NOTE — ED Notes (Signed)
Pt states he is ready to go home. EDP aware

## 2018-04-12 NOTE — ED Notes (Addendum)
Called lab to clarify collect time on Troponin. Lab states results are from blood received at 1950

## 2018-04-12 NOTE — ED Triage Notes (Signed)
Pt presents via [facility] from home. Was found in yard, passed out, pt has cardiac hx. Pt staterted having chest pain and neck pain in transit. 20g in hand. 350 cbg in yard wrking 8/10 pressure allergic to nitro.

## 2019-10-26 ENCOUNTER — Telehealth: Payer: Self-pay | Admitting: Cardiovascular Disease

## 2019-10-26 NOTE — Telephone Encounter (Signed)
3 attempts to schedule fu appt from recall list.   Deleting recall.   

## 2022-02-08 DEATH — deceased
